# Patient Record
Sex: Female | Born: 1950
Health system: Southern US, Community
[De-identification: ages and names within clinical notes are randomized; demographics above are authoritative.]

## PROBLEM LIST (undated history)

## (undated) DIAGNOSIS — M1990 Unspecified osteoarthritis, unspecified site: Secondary | ICD-10-CM

## (undated) DIAGNOSIS — I10 Essential (primary) hypertension: Secondary | ICD-10-CM

## (undated) DIAGNOSIS — N200 Calculus of kidney: Secondary | ICD-10-CM

## (undated) DIAGNOSIS — B029 Zoster without complications: Secondary | ICD-10-CM

## (undated) DIAGNOSIS — H9190 Unspecified hearing loss, unspecified ear: Secondary | ICD-10-CM

## (undated) HISTORY — PX: TONSILLECTOMY: SUR1361

## (undated) HISTORY — DX: Unspecified hearing loss, unspecified ear: H91.90

## (undated) HISTORY — DX: Calculus of kidney: N20.0

## (undated) HISTORY — DX: Zoster without complications: B02.9

## (undated) HISTORY — DX: Essential (primary) hypertension: I10

---

## 1990-10-02 HISTORY — PX: VAGINAL HYSTERECTOMY: SUR661

## 1998-04-12 ENCOUNTER — Other Ambulatory Visit: Admission: RE | Admit: 1998-04-12 | Discharge: 1998-04-12 | Payer: Self-pay | Admitting: *Deleted

## 1999-04-06 ENCOUNTER — Ambulatory Visit (HOSPITAL_COMMUNITY): Admission: RE | Admit: 1999-04-06 | Discharge: 1999-04-06 | Payer: Self-pay | Admitting: *Deleted

## 1999-04-06 ENCOUNTER — Encounter: Payer: Self-pay | Admitting: *Deleted

## 2000-04-06 ENCOUNTER — Encounter: Payer: Self-pay | Admitting: *Deleted

## 2000-04-06 ENCOUNTER — Ambulatory Visit (HOSPITAL_COMMUNITY): Admission: RE | Admit: 2000-04-06 | Discharge: 2000-04-06 | Payer: Self-pay | Admitting: *Deleted

## 2000-04-23 ENCOUNTER — Other Ambulatory Visit: Admission: RE | Admit: 2000-04-23 | Discharge: 2000-04-23 | Payer: Self-pay | Admitting: *Deleted

## 2001-05-14 ENCOUNTER — Other Ambulatory Visit: Admission: RE | Admit: 2001-05-14 | Discharge: 2001-05-14 | Payer: Self-pay | Admitting: *Deleted

## 2002-04-01 ENCOUNTER — Encounter: Payer: Self-pay | Admitting: *Deleted

## 2002-04-01 ENCOUNTER — Ambulatory Visit (HOSPITAL_COMMUNITY): Admission: RE | Admit: 2002-04-01 | Discharge: 2002-04-01 | Payer: Self-pay | Admitting: *Deleted

## 2003-05-01 ENCOUNTER — Ambulatory Visit (HOSPITAL_COMMUNITY): Admission: RE | Admit: 2003-05-01 | Discharge: 2003-05-01 | Payer: Self-pay | Admitting: Obstetrics and Gynecology

## 2003-05-01 ENCOUNTER — Encounter: Payer: Self-pay | Admitting: Obstetrics and Gynecology

## 2004-05-05 ENCOUNTER — Ambulatory Visit (HOSPITAL_COMMUNITY): Admission: RE | Admit: 2004-05-05 | Discharge: 2004-05-05 | Payer: Self-pay | Admitting: Obstetrics and Gynecology

## 2005-06-01 ENCOUNTER — Emergency Department (HOSPITAL_COMMUNITY): Admission: EM | Admit: 2005-06-01 | Discharge: 2005-06-02 | Payer: Self-pay | Admitting: Emergency Medicine

## 2006-04-09 ENCOUNTER — Ambulatory Visit (HOSPITAL_COMMUNITY): Admission: RE | Admit: 2006-04-09 | Discharge: 2006-04-09 | Payer: Self-pay | Admitting: Family Medicine

## 2006-05-02 ENCOUNTER — Other Ambulatory Visit: Admission: RE | Admit: 2006-05-02 | Discharge: 2006-05-02 | Payer: Self-pay | Admitting: Gynecology

## 2007-05-06 ENCOUNTER — Other Ambulatory Visit: Admission: RE | Admit: 2007-05-06 | Discharge: 2007-05-06 | Payer: Self-pay | Admitting: Gynecology

## 2008-04-29 ENCOUNTER — Ambulatory Visit (HOSPITAL_COMMUNITY): Admission: RE | Admit: 2008-04-29 | Discharge: 2008-04-29 | Payer: Self-pay | Admitting: Gynecology

## 2008-05-11 ENCOUNTER — Other Ambulatory Visit: Admission: RE | Admit: 2008-05-11 | Discharge: 2008-05-11 | Payer: Self-pay | Admitting: Gynecology

## 2008-10-01 ENCOUNTER — Ambulatory Visit: Payer: Self-pay | Admitting: Gynecology

## 2009-02-28 ENCOUNTER — Emergency Department (HOSPITAL_BASED_OUTPATIENT_CLINIC_OR_DEPARTMENT_OTHER): Admission: EM | Admit: 2009-02-28 | Discharge: 2009-02-28 | Payer: Self-pay | Admitting: Emergency Medicine

## 2009-02-28 ENCOUNTER — Ambulatory Visit: Payer: Self-pay | Admitting: Diagnostic Radiology

## 2009-03-17 ENCOUNTER — Encounter: Admission: RE | Admit: 2009-03-17 | Discharge: 2009-03-17 | Payer: Self-pay | Admitting: Family Medicine

## 2009-05-20 ENCOUNTER — Ambulatory Visit: Payer: Self-pay | Admitting: Women's Health

## 2009-05-20 ENCOUNTER — Encounter: Payer: Self-pay | Admitting: Women's Health

## 2009-05-20 ENCOUNTER — Other Ambulatory Visit: Admission: RE | Admit: 2009-05-20 | Discharge: 2009-05-20 | Payer: Self-pay | Admitting: Gynecology

## 2009-12-07 ENCOUNTER — Ambulatory Visit (HOSPITAL_COMMUNITY): Admission: RE | Admit: 2009-12-07 | Discharge: 2009-12-07 | Payer: Self-pay | Admitting: Orthopedic Surgery

## 2010-03-22 ENCOUNTER — Ambulatory Visit (HOSPITAL_COMMUNITY): Admission: RE | Admit: 2010-03-22 | Discharge: 2010-03-22 | Payer: Self-pay | Admitting: Family Medicine

## 2010-05-25 ENCOUNTER — Other Ambulatory Visit: Admission: RE | Admit: 2010-05-25 | Discharge: 2010-05-25 | Payer: Self-pay | Admitting: Obstetrics and Gynecology

## 2010-05-25 ENCOUNTER — Ambulatory Visit: Payer: Self-pay | Admitting: Women's Health

## 2010-05-26 ENCOUNTER — Ambulatory Visit: Payer: Self-pay | Admitting: Women's Health

## 2010-06-03 ENCOUNTER — Ambulatory Visit: Payer: Self-pay | Admitting: Women's Health

## 2010-06-14 ENCOUNTER — Ambulatory Visit: Payer: Self-pay | Admitting: Gynecology

## 2010-09-30 ENCOUNTER — Ambulatory Visit: Admit: 2010-09-30 | Payer: Self-pay | Admitting: Gynecology

## 2010-10-24 ENCOUNTER — Encounter: Payer: Self-pay | Admitting: Family Medicine

## 2010-10-28 ENCOUNTER — Ambulatory Visit
Admission: RE | Admit: 2010-10-28 | Discharge: 2010-10-28 | Payer: Self-pay | Source: Home / Self Care | Attending: Gynecology | Admitting: Gynecology

## 2011-01-10 LAB — CBC
MCHC: 33.7 g/dL (ref 30.0–36.0)
MCV: 89.7 fL (ref 78.0–100.0)
Platelets: 269 10*3/uL (ref 150–400)
RBC: 4.9 MIL/uL (ref 3.87–5.11)
RDW: 12.9 % (ref 11.5–15.5)
WBC: 9.1 10*3/uL (ref 4.0–10.5)

## 2011-01-10 LAB — POCT CARDIAC MARKERS
Myoglobin, poc: 35.6 ng/mL (ref 12–200)
Myoglobin, poc: 57 ng/mL (ref 12–200)

## 2011-01-10 LAB — COMPREHENSIVE METABOLIC PANEL
Albumin: 4.3 g/dL (ref 3.5–5.2)
Alkaline Phosphatase: 75 U/L (ref 39–117)
BUN: 19 mg/dL (ref 6–23)
CO2: 27 mEq/L (ref 19–32)
Calcium: 9.5 mg/dL (ref 8.4–10.5)
Creatinine, Ser: 0.8 mg/dL (ref 0.4–1.2)
GFR calc Af Amer: >60 mL/min (ref >60)
Glucose, Bld: 92 mg/dL (ref 70–99)
Potassium: 3.9 mEq/L (ref 3.5–5.1)

## 2011-01-10 LAB — DIFFERENTIAL
Basophils Relative: 1 % (ref 0–1)
Eosinophils Absolute: 0 10*3/uL (ref 0.0–0.7)
Lymphs Abs: 1.7 10*3/uL (ref 0.7–4.0)

## 2011-01-10 LAB — LIPASE, BLOOD: Lipase: 204 U/L (ref 23–300)

## 2011-08-10 ENCOUNTER — Other Ambulatory Visit: Payer: Self-pay | Admitting: Dermatology

## 2011-09-07 ENCOUNTER — Encounter: Payer: Self-pay | Admitting: *Deleted

## 2011-09-07 DIAGNOSIS — H9190 Unspecified hearing loss, unspecified ear: Secondary | ICD-10-CM | POA: Insufficient documentation

## 2011-09-07 DIAGNOSIS — B029 Zoster without complications: Secondary | ICD-10-CM | POA: Insufficient documentation

## 2011-09-07 DIAGNOSIS — I10 Essential (primary) hypertension: Secondary | ICD-10-CM | POA: Insufficient documentation

## 2011-09-08 ENCOUNTER — Encounter: Payer: Self-pay | Admitting: Women's Health

## 2011-09-08 ENCOUNTER — Ambulatory Visit (INDEPENDENT_AMBULATORY_CARE_PROVIDER_SITE_OTHER): Payer: BC Managed Care – PPO | Admitting: Women's Health

## 2011-09-08 ENCOUNTER — Other Ambulatory Visit (HOSPITAL_COMMUNITY)
Admission: RE | Admit: 2011-09-08 | Discharge: 2011-09-08 | Disposition: A | Discharge status: Home / Self Care | Payer: BC Managed Care – PPO | Source: Ambulatory Visit | Attending: Women's Health | Admitting: Women's Health

## 2011-09-08 VITALS — BP 120/80 | Ht 61.0 in | Wt 120.5 lb

## 2011-09-08 DIAGNOSIS — Z13220 Encounter for screening for lipoid disorders: Secondary | ICD-10-CM

## 2011-09-08 DIAGNOSIS — Z01419 Encounter for gynecological examination (general) (routine) without abnormal findings: Secondary | ICD-10-CM

## 2011-09-08 DIAGNOSIS — M810 Age-related osteoporosis without current pathological fracture: Secondary | ICD-10-CM

## 2011-09-08 DIAGNOSIS — R82998 Other abnormal findings in urine: Secondary | ICD-10-CM

## 2011-09-08 DIAGNOSIS — Z833 Family history of diabetes mellitus: Secondary | ICD-10-CM

## 2011-09-08 MED ORDER — ALENDRONATE SODIUM 70 MG PO TABS: 70.0000 mg | ORAL_TABLET | ORAL | Status: AC

## 2011-09-08 NOTE — Progress Notes (Signed)
Hannah Fisher 1951/05/04 [id number]    History:    The patient presents for annual exam.  Retired Chartered loss adjuster. Has 2 daughters, 4 grandchildren one daughter having marital issues which has her worried.   Past medical history, past surgical history, family history and social history were all reviewed and documented in the EPIC chart.   ROS:  A  ROS was performed and pertinent positives and negatives are included in the history.  Exam:  Filed Vitals:   09/08/11 1419  BP: 120/80    General appearance:  Normal Head/Neck:  Normal, without cervical or supraclavicular adenopathy. Thyroid:  Symmetrical, normal in size, without palpable masses or nodularity. Respiratory  Effort:  Normal  Auscultation:  Clear without wheezing or rhonchi Cardiovascular  Auscultation:  Regular rate, without rubs, murmurs or gallops  Edema/varicosities:  Not grossly evident Abdominal  Soft,nontender, without masses, guarding or rebound.  Liver/spleen:  No organomegaly noted  Hernia:  None appreciated  Skin  Inspection:  Grossly normal  Palpation:  Grossly normal Neurologic/psychiatric  Orientation:  Normal with appropriate conversation.  Mood/affect:  Normal  Genitourinary    Breasts: Examined lying and sitting.     Right: Without masses, retractions, discharge or axillary adenopathy.     Left: Without masses, retractions, discharge or axillary adenopathy.   Inguinal/mons:  Normal without inguinal adenopathy  External genitalia:  Normal  BUS/Urethra/Skene's glands:  Normal  Bladder:  Normal  Vagina:  Normal  Cervix:  absent  Uterus:     Adnexa/parametria:     Rt: Without masses or tenderness.   Lt: Without masses or tenderness.  Anus and perineum: Normal  Digital rectal exam: Normal sphincter tone without palpated masses or tenderness  Assessment/Plan:  60 y.o. DWF G2P2 for annual exam. Hysterectomy 89 for menorrhagia on no ERT. History of normal Paps and mammograms. Colonoscopy in  2011 with one benign polyp. Has had zostovac.  Normal postmenopausal exam Hypertension/primary care Osteoporosis/T score -2.8/ 2011  Plan: Fosamax 70 weekly, prescription, proper use, given and reviewed. Fall prevention and home safety reviewed. Repeat DEXA  August, 2013. SBEs, annual mammogram, overdue instructed to schedule. Encouraged exercise, calcium rich diet, vitamin D 2000 daily. CBC, glucose, lipid profile, UA, Pap (requested labs to be done here).    Harrington Challenger [facility], 5:12 PM 09/08/2011

## 2011-09-08 NOTE — Patient Instructions (Signed)
tdap vaccine   

## 2011-09-12 ENCOUNTER — Other Ambulatory Visit: Payer: Self-pay

## 2011-09-12 NOTE — Telephone Encounter (Signed)
GETS PRN XANAX AND IS REQUESTING REFILLS SINCE YOU JUST SAW HER FOR 09-08-11 AEX.

## 2011-09-13 NOTE — Telephone Encounter (Signed)
rx called in and pt informed. kw

## 2011-09-13 NOTE — Telephone Encounter (Signed)
Please call in Xanax 0.25 every 8 hours when necessary #[phone] with 1 refill and inform patient- thanks

## 2012-05-24 ENCOUNTER — Other Ambulatory Visit: Payer: Self-pay | Admitting: Women's Health

## 2012-05-27 MED ORDER — ALPRAZOLAM 0.25 MG PO TABS: 0.2500 mg | ORAL_TABLET | Freq: Three times a day (TID) | ORAL | Status: DC

## 2012-05-27 NOTE — Telephone Encounter (Signed)
Called into pharmacy

## 2012-07-03 ENCOUNTER — Other Ambulatory Visit (HOSPITAL_COMMUNITY): Payer: Self-pay | Admitting: Obstetrics and Gynecology

## 2012-07-03 DIAGNOSIS — Z1231 Encounter for screening mammogram for malignant neoplasm of breast: Secondary | ICD-10-CM

## 2012-07-22 ENCOUNTER — Ambulatory Visit (HOSPITAL_COMMUNITY): Payer: BC Managed Care – PPO

## 2012-08-08 ENCOUNTER — Ambulatory Visit (HOSPITAL_COMMUNITY): Payer: BC Managed Care – PPO

## 2012-09-09 ENCOUNTER — Encounter: Payer: Self-pay | Admitting: Women's Health

## 2012-09-09 ENCOUNTER — Ambulatory Visit (INDEPENDENT_AMBULATORY_CARE_PROVIDER_SITE_OTHER): Payer: BC Managed Care – PPO | Admitting: Women's Health

## 2012-09-09 VITALS — BP 136/86 | Ht 63.5 in | Wt 123.0 lb

## 2012-09-09 DIAGNOSIS — F419 Anxiety disorder, unspecified: Secondary | ICD-10-CM

## 2012-09-09 DIAGNOSIS — F411 Generalized anxiety disorder: Secondary | ICD-10-CM

## 2012-09-09 DIAGNOSIS — M810 Age-related osteoporosis without current pathological fracture: Secondary | ICD-10-CM

## 2012-09-09 DIAGNOSIS — Z01419 Encounter for gynecological examination (general) (routine) without abnormal findings: Secondary | ICD-10-CM

## 2012-09-09 LAB — CBC WITH DIFFERENTIAL/PLATELET
Hemoglobin: 12.9 g/dL (ref 12.0–15.0)
Lymphocytes Relative: 36 % (ref 12–46)
Lymphs Abs: 2.1 10*3/uL (ref 0.7–4.0)
MCH: 29.5 pg (ref 26.0–34.0)
Monocytes Relative: 5 % (ref 3–12)
Neutro Abs: 3.2 10*3/uL (ref 1.7–7.7)
Neutrophils Relative %: 55 % (ref 43–77)
RBC: 4.38 MIL/uL (ref 3.87–5.11)
WBC: 5.9 10*3/uL (ref 4.0–10.5)

## 2012-09-09 MED ORDER — ALPRAZOLAM 0.25 MG PO TABS: 0.2500 mg | ORAL_TABLET | Freq: Three times a day (TID) | ORAL | Status: DC

## 2012-09-09 MED ORDER — ALENDRONATE SODIUM 70 MG PO TABS: 70.0000 mg | ORAL_TABLET | ORAL | Status: DC

## 2012-09-09 NOTE — Patient Instructions (Addendum)
Health Recommendations for Postmenopausal Women  SCHEDULE MAMMOGRAM!!!!!  Based on the Results of the Women's Health Initiative [facility]) and Other Studies The WHI is a major 15-year research program to address the most common causes of death, disability and poor quality of life in postmenopausal women. Some of these causes are heart disease, cancer, bone loss (osteoporosis) and others. Taking into account all of the findings from [facility] and other studies, here are bottom-line health recommendations for women: CARDIOVASCULAR DISEASE Heart Disease: A heart attack is a medical emergency. Know the signs and symptoms of a heart attack. Hormone therapy should not be used to prevent heart disease. In women with heart disease, hormone therapy should not be used to prevent further disease. Hormone therapy increases the risk of blood clots. Below are things women can do to reduce their risk for heart disease.   Do not smoke. If you smoke, quit. Women who smoke are 2 to 6 times more likely to suffer a heart attack than non-smoking women.  Aim for a healthy weight. Being overweight causes many preventable deaths. Eat a healthy and balanced diet and drink an adequate amount of liquids.  Get moving. Make a commitment to be more physically active. Aim for 30 minutes of activity on most, if not all days of the week.  Eat for heart health. Choose a diet that is low in saturated fat, trans fat, and cholesterol. Include whole grains, vegetables, and fruits. Read the labels on the food container before buying it.  Know your numbers. Ask your caregiver to check your blood pressure, cholesterol (total, HDL, LDL, triglycerides) and blood glucose. Work with your caregiver to improve any numbers that are not normal.  High blood pressure. Limit or stop your table salt intake (try salt substitute and food seasonings), avoid salty foods and drinks. Read the labels on the food container before buying it. Avoid becoming overweight  by eating well and exercising. STROKE  Stroke is a medical emergency. Stroke can be the result of a blood clot in the blood vessel in the brain or by a brain hemorrhage (bleeding). Know the signs and symptoms of a stroke. To lower the risk of developing a stroke:  Avoid fatty foods.  Quit smoking.  Control your diabetes, blood pressure, and irregular heart rate. THROMBOPHLIBITIS (BLOOD CLOT) OF THE LEG  Hormone treatment is a big cause of developing blood clots in the leg. Becoming overweight and leading a stationary lifestyle also may contribute to developing blood clots. Controlling your diet and exercising will help lower the risk of developing blood clots. CANCER SCREENING  Breast Cancer: Women should take steps to reduce their risk of breast cancer. This includes having regular mammograms, monthly self breast exams and regular breast exams by your caregiver. Have a mammogram every one to two years if you are 57 to 62 years old. Have a mammogram annually if you are 59 years old or older depending on your risk factors. Women who are high risk for breast cancer may need more frequent mammograms. There are tests available (testing the genes in your body) if you have family history of breast cancer called BRCA 1 and 2. These tests can help determine the risks of developing breast cancer.  Intestinal or Stomach Cancer: Women should talk to their caregiver about when to start screening, what tests and how often they should be done, and the benefits and risks of doing these tests. Tests to consider are a rectal exam, fecal occult blood, sigmoidoscopy, colononoscoby, barium enema  and upper GI series of the stomach. Depending on the age, you may want to get a medical and family history of colon cancer. Women who are high risk may need to be screened at an earlier age and more often.  Cervical Cancer: A Pap test of the cervix should be done every year and every 3 years when there has been three straight  years of a normal Pap test. Women with an abnormal Pap test should be screened more often or have a cervical biopsy depending on your caregiver's recommendation.  Uterine Cancer: If you have vaginal bleeding after you are in the menopause, it should be evaluated by your caregiver.  Ovarian cancer: There are no reliable tests available to screen for ovarian cancer at this time except for yearly pelvic exams.  Lung Cancer: Yearly chest X-rays can detect lung cancer and should be done on high risk women, such as cigarette smokers and women with chronic lung disease (emphysemia).  Skin Cancer: A complete body skin exam should be done at your yearly examination. Avoid overexposure to the sun and ultraviolet light lamps. Use a strong sun block cream when in the sun. All of these things are important in lowering the risk of skin cancer. MENOPAUSE Menopause Symptoms: Hormone therapy products are effective for treating symptoms associated with menopause:  Moderate to severe hot flashes.  Night sweats.  Mood swings.  Headaches.  Tiredness.  Loss of sex drive.  Insomnia.  Other symptoms. However, hormone therapy products carry serious risks, especially in older women. Women who use or are thinking about using estrogen or estrogen with progestin treatments should discuss that with their caregiver. Your caregiver will know if the benefits outweigh the risks. The Food and Drug Administration (FDA) has concluded that hormone therapy should be used only at the lowest doses and for the shortest amount of time to reach treatment goals. It is not known at what doses there may be less risk of serious side effects. There are other treatments such as herbal medication (not controlled or regulated by the FDA), group therapy, counseling and acupuncture that may be helpful. OSTEOPOROSIS Protecting Against Bone Loss and Preventing Fracture: If hormone therapy is used for prevention of bone loss (osteoporosis),  the risks for bone loss must outweigh the risk of the therapy. Women considering taking hormone therapy for bone loss should ask their health care providers about other medications (fosamax and boniva) that are considered safe and effective for preventing bone loss and bone fractures. To guard against bone loss or fractures, it is recommended that women should take at least 1000-1500 mg of calcium and 400-800 IU of vitamin D daily in divided doses. Smoking and excessive alcohol intake increases the risk of osteoporosis. Eat foods rich in calcium and vitamin D and do weight bearing exercises several times a week as your caregiver suggests. DIABETES Diabetes Melitus: Women with Type I or Type 2 diabetes should keep their diabetes in control with diet, exercise and medication. Avoid too many sweets, starchy and fatty foods. Being overweight can affect your diabetes. COGNITION AND MEMORY Cognition and Memory: Menopausal hormone therapy is not recommended for the prevention of cognitive disorders such as Alzheimer's disease or memory loss. WHI found that women treated with hormone therapy have a greater risk of developing dementia.  DEPRESSION  Depression may occur at any age, but is common in elderly women. The reasons may be because of physical, medical, social (loneliness), financial and/or economic problems and needs. Becoming involved with church,  volunteer or social groups, seeking treatment for any physical or medical problems is recommended. Also, look into getting professional advice for any economic or financial problems. ACCIDENTS  Accidents are common and can be serious in the elderly woman. Prepare your house to prevent accidents. Eliminate throw rugs, use hip protectors, place hand bars in the bath, shower and toilet areas. Avoid wearing high heel shoes and walking on wet, snowy and icy areas. Stop driving if you have vision, hearing problems or are unsteady with you movements and  reflexes. RHEUMATOID ARTHRITIS Rheumatoid arthritis causes pain, swelling and stiffness of your bone joints. It can limit many of your activities. Over-the-counter medications may help, but prescription medications may be necessary. Talk with your caregiver about this. Exercise (walking, water aerobics), good posture, using splints on painful joints, warm baths or applying warm compresses to stiff joints and cold compresses to painful joints may be helpful. Smoking and excessive drinking may worsen the symptoms of arthritis. Seek help from a physical therapist if the arthritis is becoming a problem with your daily activities. IMMUNIZATIONS  Several immunizations are important to have during your senior years, including:   Tetanus and a diptheria shot booster every 10 years.  Influenza every year before the flu season begins.  Pneumonia vaccine.  Shingles vaccine.  Others as indicated (example: H1N1 vaccine). Document Released: 11/10/2005 Document Revised: 12/11/2011 Document Reviewed: 07/06/2008 [facility] Patient Information 2013 [place], [place].

## 2012-09-09 NOTE — Progress Notes (Signed)
Hannah Fisher 05-27-1951 [id number]    History:    The patient presents for annual exam.  TVH in 1992/ menorrhagia. Osteoporosis  T score  -2.8 at spine, bilateral hip average -2.3., Significant decrease in BMD in the spine and hip,  Fosamax 70 weekly since 8/ 2011. Was on Fosamax in 2007  but had quit. History of normal calcium, PTH, vitamin D 2011. History of normal mammograms, last one 2011. Hypertensive - primary care manages. History of shingles causing left ear deafness. Takes an occasional Xanax. Colonoscopy 03/2010-negative polyp.   Past medical history, past surgical history, family history and social history were all reviewed and documented in the EPIC chart. Retired Chartered loss adjuster, working part time. Same partner. 2 daughters, one lives in [place] having marital problems, 1 lives here.   ROS:  A  ROS was performed and pertinent positives and negatives are included in the history.  Exam:  Filed Vitals:   09/09/12 1549  BP: 136/86    General appearance:  Normal Head/Neck:  Normal, without cervical or supraclavicular adenopathy. Thyroid:  Symmetrical, normal in size, without palpable masses or nodularity. Respiratory  Effort:  Normal  Auscultation:  Clear without wheezing or rhonchi Cardiovascular  Auscultation:  Regular rate, without rubs, murmurs or gallops  Edema/varicosities:  Not grossly evident Abdominal  Soft,nontender, without masses, guarding or rebound.  Liver/spleen:  No organomegaly noted  Hernia:  None appreciated  Skin  Inspection:  Grossly normal  Palpation:  Grossly normal Neurologic/psychiatric  Orientation:  Normal with appropriate conversation.  Mood/affect:  Normal  Genitourinary    Breasts: Examined lying and sitting.     Right: Without masses, retractions, discharge or axillary adenopathy.     Left: Without masses, retractions, discharge or axillary adenopathy.   Inguinal/mons:  Normal without inguinal adenopathy  External genitalia:   Normal  BUS/Urethra/Skene's glands:  Normal  Bladder:  Normal  Vagina:  Normal  Cervix:  Absent  Uterus:  Absent  Adnexa/parametria:     Rt: Without masses or tenderness.   Lt: Without masses or tenderness.  Anus and perineum: Normal  Digital rectal exam: Normal sphincter tone without palpated masses or tenderness  Assessment/Plan:  61 y.o. D. WF G2 P2 for annual exam without complaint.  TVH/menorrhagia/no ERT Osteoporosis T score -2.8 at spine Fosamax 70 weekly Hypertension primary care meds Left ear partial deafness shingles  Plan: Repeat DEXA. Will schedule, reviewed importance of exercise, home safety and fall prevention, continue vitamin D 2000 daily. SBE's, instructed to schedule annual mammogram. CBC, glucose, TSH, UA, Pap normal 2012 reviewed new screening guidelines. Normal lipid panel last year will come fasting next year for FLP. Fosamax 70 mg weekly prescription, proper administration reviewed.  Harrington Challenger [facility], 5:28 PM 09/09/2012

## 2012-09-10 LAB — GLUCOSE, RANDOM: Glucose, Bld: 99 mg/dL (ref 70–99)

## 2012-09-10 LAB — URINALYSIS W MICROSCOPIC + REFLEX CULTURE
Casts: NONE SEEN
Crystals: NONE SEEN
Nitrite: NEGATIVE
Specific Gravity, Urine: 1.011 (ref 1.005–1.030)
Urobilinogen, UA: 0.2 mg/dL (ref 0.0–1.0)
pH: 7 (ref 5.0–8.0)

## 2012-09-11 LAB — URINE CULTURE: Organism ID, Bacteria: NO GROWTH

## 2012-09-24 ENCOUNTER — Encounter: Payer: Self-pay | Admitting: Gynecology

## 2012-11-28 ENCOUNTER — Other Ambulatory Visit: Payer: Self-pay

## 2012-11-28 DIAGNOSIS — F419 Anxiety disorder, unspecified: Secondary | ICD-10-CM

## 2012-11-28 MED ORDER — ALPRAZOLAM 0.25 MG PO TABS: 0.2500 mg | ORAL_TABLET | Freq: Three times a day (TID) | ORAL | Status: DC

## 2012-11-28 NOTE — Telephone Encounter (Signed)
rx called into pharmacy

## 2013-04-17 ENCOUNTER — Other Ambulatory Visit: Payer: Self-pay

## 2013-04-17 DIAGNOSIS — F419 Anxiety disorder, unspecified: Secondary | ICD-10-CM

## 2013-04-17 MED ORDER — ALPRAZOLAM 0.25 MG PO TABS: 0.2500 mg | ORAL_TABLET | Freq: Three times a day (TID) | ORAL | Status: DC

## 2013-04-17 NOTE — Telephone Encounter (Signed)
rx called into pharmacy

## 2014-03-03 ENCOUNTER — Ambulatory Visit (INDEPENDENT_AMBULATORY_CARE_PROVIDER_SITE_OTHER): Payer: BC Managed Care – PPO | Admitting: Women's Health

## 2014-03-03 ENCOUNTER — Encounter: Payer: Self-pay | Admitting: Women's Health

## 2014-03-03 ENCOUNTER — Other Ambulatory Visit (HOSPITAL_COMMUNITY): Payer: Self-pay | Admitting: Family Medicine

## 2014-03-03 VITALS — BP 120/80 | Ht 61.5 in | Wt 116.0 lb

## 2014-03-03 DIAGNOSIS — Z13220 Encounter for screening for lipoid disorders: Secondary | ICD-10-CM

## 2014-03-03 DIAGNOSIS — Z01419 Encounter for gynecological examination (general) (routine) without abnormal findings: Secondary | ICD-10-CM

## 2014-03-03 DIAGNOSIS — F411 Generalized anxiety disorder: Secondary | ICD-10-CM

## 2014-03-03 DIAGNOSIS — Z1231 Encounter for screening mammogram for malignant neoplasm of breast: Secondary | ICD-10-CM

## 2014-03-03 DIAGNOSIS — M810 Age-related osteoporosis without current pathological fracture: Secondary | ICD-10-CM

## 2014-03-03 DIAGNOSIS — F419 Anxiety disorder, unspecified: Secondary | ICD-10-CM

## 2014-03-03 LAB — COMPREHENSIVE METABOLIC PANEL
ALT: 13 U/L (ref 0–35)
AST: 16 U/L (ref 0–37)
Albumin: 4.5 g/dL (ref 3.5–5.2)
Alkaline Phosphatase: 60 U/L (ref 39–117)
BUN: 21 mg/dL (ref 6–23)
CHLORIDE: 100 meq/L (ref 96–112)
CO2: 28 mEq/L (ref 19–32)
Calcium: 9.8 mg/dL (ref 8.4–10.5)
Creat: 0.79 mg/dL (ref 0.50–1.10)
GLUCOSE: 85 mg/dL (ref 70–99)
Potassium: 3.8 mEq/L (ref 3.5–5.3)
SODIUM: 139 meq/L (ref 135–145)
Total Bilirubin: 0.3 mg/dL (ref 0.2–1.2)
Total Protein: 6.8 g/dL (ref 6.0–8.3)

## 2014-03-03 LAB — CBC WITH DIFFERENTIAL/PLATELET
Basophils Absolute: 0 10*3/uL (ref 0.0–0.1)
Basophils Relative: 0 % (ref 0–1)
Eosinophils Absolute: 0.2 10*3/uL (ref 0.0–0.7)
Eosinophils Relative: 2 % (ref 0–5)
HCT: 38 % (ref 36.0–46.0)
Hemoglobin: 13.1 g/dL (ref 12.0–15.0)
Lymphocytes Relative: 29 % (ref 12–46)
Lymphs Abs: 2.7 10*3/uL (ref 0.7–4.0)
MCH: 29.8 pg (ref 26.0–34.0)
MCHC: 34.5 g/dL (ref 30.0–36.0)
MCV: 86.6 fL (ref 78.0–100.0)
Monocytes Absolute: 0.5 10*3/uL (ref 0.1–1.0)
Monocytes Relative: 5 % (ref 3–12)
Neutro Abs: 6 10*3/uL (ref 1.7–7.7)
Neutrophils Relative %: 64 % (ref 43–77)
Platelets: 241 10*3/uL (ref 150–400)
RBC: 4.39 MIL/uL (ref 3.87–5.11)
RDW: 13.7 % (ref 11.5–15.5)
WBC: 9.4 10*3/uL (ref 4.0–10.5)

## 2014-03-03 LAB — LIPID PANEL
Cholesterol: 180 mg/dL (ref 0–200)
HDL: 59 mg/dL (ref >39)
LDL Cholesterol: 87 mg/dL (ref 0–99)
TRIGLYCERIDES: 172 mg/dL — AB (ref <150)
Total CHOL/HDL Ratio: 3.1 Ratio
VLDL: 34 mg/dL (ref 0–40)

## 2014-03-03 MED ORDER — ALPRAZOLAM 0.25 MG PO TABS: 0.2500 mg | ORAL_TABLET | Freq: Three times a day (TID) | ORAL | Status: DC

## 2014-03-03 NOTE — Progress Notes (Signed)
Hannah Fisher Nov 10, 1950 [id number]    History:    Presents for annual exam.  Postmenopausal on no HRT. 1992 TVH for menorrhagia. Same partner. Normal Pap and mammogram history. Osteoporosis last DEXA 05/2010 T score of -2.8 at spine hip average -2.3. Had been on Fosomax  in 2011 and stopped. 2011, normal PTH, calcium, vitamin D. Colonoscopy negative polyp. Hypertension primary care manages.  Past medical history, past surgical history, family history and social history were all reviewed and documented in the EPIC chart. Retired Engineer, site, Lawyer now. Deaf in left ear from shingles. 2 daughters and 5 grandchildren.  ROS:  A  12 point ROS was performed and pertinent positives and negatives are included.  Exam:  Filed Vitals:   03/03/14 1605  BP: 120/80    General appearance:  Normal Thyroid:  Symmetrical, normal in size, without palpable masses or nodularity. Respiratory  Auscultation:  Clear without wheezing or rhonchi Cardiovascular  Auscultation:  Regular rate, without rubs, murmurs or gallops  Edema/varicosities:  Not grossly evident Abdominal  Soft,nontender, without masses, guarding or rebound.  Liver/spleen:  No organomegaly noted  Hernia:  None appreciated  Skin  Inspection:  Grossly normal   Breasts: Examined lying and sitting.     Right: Without masses, retractions, discharge or axillary adenopathy.     Left: Without masses, retractions, discharge or axillary adenopathy. Gentitourinary   Inguinal/mons:  Normal without inguinal adenopathy  External genitalia:  Normal  BUS/Urethra/Skene's glands:  Normal  Vagina:  Normal  Cervix:  Absent  Uterus:  Absent Adnexa/parametria:     Rt: Without masses or tenderness.   Lt: Without masses or tenderness.  Anus and perineum: Normal  Digital rectal exam: Normal sphincter tone without palpated masses or tenderness  Assessment/Plan:  63 y.o.DWF G2P2  for annual exam.     Postmenopausal/TVH/no HRT   Hypertension-primary care manages Osteoporosis on no medication  Plan: Repeat DEXA, medications reviewed, SBE's, annual mammogram, calcium rich diet, vitamin D 2000 daily. Home Safety and fall prevention discussed. CBC, comprehensive metabolic panel, lipid panel, UA, will take lab results to primary care next month.     Note: This dictation was prepared with Dragon/digital dictation.  Any transcriptional errors that result are unintentional. Harrington Challenger [facility], 5:17 PM 03/03/2014

## 2014-03-03 NOTE — Patient Instructions (Signed)
Health Recommendations for Postmenopausal Women Respected and ongoing research has looked at the most common causes of death, disability, and poor quality of life in postmenopausal women. The causes include heart disease, diseases of blood vessels, diabetes, depression, cancer, and bone loss (osteoporosis). Many things can be done to help lower the chances of developing these and other common problems: CARDIOVASCULAR DISEASE Heart Disease: A heart attack is a medical emergency. Know the signs and symptoms of a heart attack. Below are things women can do to reduce their risk for heart disease.   Do not smoke. If you smoke, quit.  Aim for a healthy weight. Being overweight causes many preventable deaths. Eat a healthy and balanced diet and drink an adequate amount of liquids.  Get moving. Make a commitment to be more physically active. Aim for 30 minutes of activity on most, if not all days of the week.  Eat for heart health. Choose a diet that is low in saturated fat and cholesterol and eliminate trans fat. Include whole grains, vegetables, and fruits. Read and understand the labels on food containers before buying.  Know your numbers. Ask your caregiver to check your blood pressure, cholesterol (total, HDL, LDL, triglycerides) and blood glucose. Work with your caregiver on improving your entire clinical picture.  High blood pressure. Limit or stop your table salt intake (try salt substitute and food seasonings). Avoid salty foods and drinks. Read labels on food containers before buying. Eating well and exercising can help control high blood pressure. STROKE  Stroke is a medical emergency. Stroke may be the result of a blood clot in a blood vessel in the brain or by a brain hemorrhage (bleeding). Know the signs and symptoms of a stroke. To lower the risk of developing a stroke:  Avoid fatty foods.  Quit smoking.  Control your diabetes, blood pressure, and irregular heart rate. THROMBOPHLEBITIS  (BLOOD CLOT) OF THE LEG  Becoming overweight and leading a stationary lifestyle may also contribute to developing blood clots. Controlling your diet and exercising will help lower the risk of developing blood clots. CANCER SCREENING  Breast Cancer: Take steps to reduce your risk of breast cancer.  You should practice "breast self-awareness." This means understanding the normal appearance and feel of your breasts and should include breast self-examination. Any changes detected, no matter how small, should be reported to your caregiver.  After age 40, you should have a clinical breast exam (CBE) every year.  Starting at age 40, you should consider having a mammogram (breast X-ray) every year.  If you have a family history of breast cancer, talk to your caregiver about genetic screening.  If you are at high risk for breast cancer, talk to your caregiver about having an MRI and a mammogram every year.  Intestinal or Stomach Cancer: Tests to consider are a rectal exam, fecal occult blood, sigmoidoscopy, and colonoscopy. Women who are high risk may need to be screened at an earlier age and more often.  Cervical Cancer:  Beginning at age 30, you should have a Pap test every 3 years as long as the past 3 Pap tests have been normal.  If you have had past treatment for cervical cancer or a condition that could lead to cancer, you need Pap tests and screening for cancer for at least 20 years after your treatment.  If you had a hysterectomy for a problem that was not cancer or a condition that could lead to cancer, then you no longer need Pap tests.    If you are between ages 65 and 70, and you have had normal Pap tests going back 10 years, you no longer need Pap tests.  If Pap tests have been discontinued, risk factors (such as a new sexual partner) need to be reassessed to determine if screening should be resumed.  Some medical problems can increase the chance of getting cervical cancer. In these  cases, your caregiver may recommend more frequent screening and Pap tests.  Uterine Cancer: If you have vaginal bleeding after reaching menopause, you should notify your caregiver.  Ovarian cancer: Other than yearly pelvic exams, there are no reliable tests available to screen for ovarian cancer at this time except for yearly pelvic exams.  Lung Cancer: Yearly chest X-rays can detect lung cancer and should be done on high risk women, such as cigarette smokers and women with chronic lung disease (emphysema).  Skin Cancer: A complete body skin exam should be done at your yearly examination. Avoid overexposure to the sun and ultraviolet light lamps. Use a strong sun block cream when in the sun. All of these things are important in lowering the risk of skin cancer. MENOPAUSE Menopause Symptoms: Hormone therapy products are effective for treating symptoms associated with menopause:  Moderate to severe hot flashes.  Night sweats.  Mood swings.  Headaches.  Tiredness.  Loss of sex drive.  Insomnia.  Other symptoms. Hormone replacement carries certain risks, especially in older women. Women who use or are thinking about using estrogen or estrogen with progestin treatments should discuss that with their caregiver. Your caregiver will help you understand the benefits and risks. The ideal dose of hormone replacement therapy is not known. The Food and Drug Administration (FDA) has concluded that hormone therapy should be used only at the lowest doses and for the shortest amount of time to reach treatment goals.  OSTEOPOROSIS Protecting Against Bone Loss and Preventing Fracture: If you use hormone therapy for prevention of bone loss (osteoporosis), the risks for bone loss must outweigh the risk of the therapy. Ask your caregiver about other medications known to be safe and effective for preventing bone loss and fractures. To guard against bone loss or fractures, the following is recommended:  If  you are less than age 50, take 1000 mg of calcium and at least 600 mg of Vitamin D per day.  If you are greater than age 50 but less than age 70, take 1200 mg of calcium and at least 600 mg of Vitamin D per day.  If you are greater than age 70, take 1200 mg of calcium and at least 800 mg of Vitamin D per day. Smoking and excessive alcohol intake increases the risk of osteoporosis. Eat foods rich in calcium and vitamin D and do weight bearing exercises several times a week as your caregiver suggests. DIABETES Diabetes Melitus: If you have Type I or Type 2 diabetes, you should keep your blood sugar under control with diet, exercise and recommended medication. Avoid too many sweets, starchy and fatty foods. Being overweight can make control more difficult. COGNITION AND MEMORY Cognition and Memory: Menopausal hormone therapy is not recommended for the prevention of cognitive disorders such as Alzheimer's disease or memory loss.  DEPRESSION  Depression may occur at any age, but is common in elderly women. The reasons may be because of physical, medical, social (loneliness), or financial problems and needs. If you are experiencing depression because of medical problems and control of symptoms, talk to your caregiver about this. Physical activity and   exercise may help with mood and sleep. Community and volunteer involvement may help your sense of value and worth. If you have depression and you feel that the problem is getting worse or becoming severe, talk to your caregiver about treatment options that are best for you. ACCIDENTS  Accidents are common and can be serious in the elderly woman. Prepare your house to prevent accidents. Eliminate throw rugs, place hand bars in the bath, shower and toilet areas. Avoid wearing high heeled shoes or walking on wet, snowy, and icy areas. Limit or stop driving if you have vision or hearing problems, or you feel you are unsteady with you movements and  reflexes. HEPATITIS C Hepatitis C is a type of viral infection affecting the liver. It is spread mainly through contact with blood from an infected person. It can be treated, but if left untreated, it can lead to severe liver damage over years. Many people who are infected do not know that the virus is in their blood. If you are a "baby-boomer", it is recommended that you have one screening test for Hepatitis C. IMMUNIZATIONS  Several immunizations are important to consider having during your senior years, including:   Tetanus, diptheria, and pertussis booster shot.  Influenza every year before the flu season begins.  Pneumonia vaccine.  Shingles vaccine.  Others as indicated based on your specific needs. Talk to your caregiver about these. Document Released: 11/10/2005 Document Revised: 09/04/2012 Document Reviewed: 07/06/2008 [employer] Patient Information 2014 [employer].  

## 2014-03-04 LAB — URINALYSIS W MICROSCOPIC + REFLEX CULTURE
BACTERIA UA: NONE SEEN
Bilirubin Urine: NEGATIVE
CASTS: NONE SEEN
CRYSTALS: NONE SEEN
Glucose, UA: NEGATIVE mg/dL
KETONES UR: NEGATIVE mg/dL
NITRITE: NEGATIVE
PH: 6.5 (ref 5.0–8.0)
Protein, ur: NEGATIVE mg/dL
Specific Gravity, Urine: 1.01 (ref 1.005–1.030)
Urobilinogen, UA: 0.2 mg/dL (ref 0.0–1.0)

## 2014-03-05 ENCOUNTER — Ambulatory Visit (HOSPITAL_COMMUNITY): Payer: BC Managed Care – PPO

## 2014-03-05 LAB — URINE CULTURE: Colony Count: 3000

## 2014-03-16 ENCOUNTER — Other Ambulatory Visit: Payer: Self-pay | Admitting: Gynecology

## 2014-03-16 DIAGNOSIS — M810 Age-related osteoporosis without current pathological fracture: Secondary | ICD-10-CM

## 2014-06-04 ENCOUNTER — Ambulatory Visit (HOSPITAL_COMMUNITY)
Admission: RE | Admit: 2014-06-04 | Discharge: 2014-06-04 | Disposition: A | Discharge status: Home / Self Care | Payer: BC Managed Care – PPO | Source: Ambulatory Visit | Attending: Family Medicine | Admitting: Family Medicine

## 2014-06-04 DIAGNOSIS — Z1231 Encounter for screening mammogram for malignant neoplasm of breast: Secondary | ICD-10-CM | POA: Diagnosis present

## 2014-08-03 ENCOUNTER — Encounter: Payer: Self-pay | Admitting: Women's Health

## 2014-09-09 ENCOUNTER — Other Ambulatory Visit: Payer: Self-pay

## 2014-09-09 DIAGNOSIS — F419 Anxiety disorder, unspecified: Secondary | ICD-10-CM

## 2014-09-09 MED ORDER — ALPRAZOLAM 0.25 MG PO TABS: 0.2500 mg | ORAL_TABLET | Freq: Three times a day (TID) | ORAL | Status: DC

## 2014-09-09 NOTE — Telephone Encounter (Signed)
Called into pharmacy

## 2014-12-28 ENCOUNTER — Other Ambulatory Visit: Payer: Self-pay

## 2014-12-28 DIAGNOSIS — F419 Anxiety disorder, unspecified: Secondary | ICD-10-CM

## 2014-12-28 MED ORDER — ALPRAZOLAM 0.25 MG PO TABS: 0.2500 mg | ORAL_TABLET | Freq: Three times a day (TID) | ORAL | Status: DC

## 2014-12-28 NOTE — Telephone Encounter (Signed)
Rx phoned in.   

## 2016-02-07 DIAGNOSIS — J029 Acute pharyngitis, unspecified: Secondary | ICD-10-CM | POA: Diagnosis not present

## 2016-02-07 DIAGNOSIS — J309 Allergic rhinitis, unspecified: Secondary | ICD-10-CM | POA: Diagnosis not present

## 2016-02-07 DIAGNOSIS — J320 Chronic maxillary sinusitis: Secondary | ICD-10-CM | POA: Diagnosis not present

## 2016-02-23 DIAGNOSIS — H01001 Unspecified blepharitis right upper eyelid: Secondary | ICD-10-CM | POA: Diagnosis not present

## 2016-02-23 DIAGNOSIS — H2513 Age-related nuclear cataract, bilateral: Secondary | ICD-10-CM | POA: Diagnosis not present

## 2016-02-23 DIAGNOSIS — H01004 Unspecified blepharitis left upper eyelid: Secondary | ICD-10-CM | POA: Diagnosis not present

## 2016-02-23 DIAGNOSIS — H01002 Unspecified blepharitis right lower eyelid: Secondary | ICD-10-CM | POA: Diagnosis not present

## 2016-02-23 DIAGNOSIS — H04123 Dry eye syndrome of bilateral lacrimal glands: Secondary | ICD-10-CM | POA: Diagnosis not present

## 2016-02-23 DIAGNOSIS — H524 Presbyopia: Secondary | ICD-10-CM | POA: Diagnosis not present

## 2016-02-23 DIAGNOSIS — H01005 Unspecified blepharitis left lower eyelid: Secondary | ICD-10-CM | POA: Diagnosis not present

## 2016-04-14 DIAGNOSIS — M545 Low back pain: Secondary | ICD-10-CM | POA: Diagnosis not present

## 2016-04-14 DIAGNOSIS — I10 Essential (primary) hypertension: Secondary | ICD-10-CM | POA: Diagnosis not present

## 2016-04-14 DIAGNOSIS — K5909 Other constipation: Secondary | ICD-10-CM | POA: Diagnosis not present

## 2016-04-14 DIAGNOSIS — Z0000 Encounter for general adult medical examination without abnormal findings: Secondary | ICD-10-CM | POA: Diagnosis not present

## 2016-04-14 DIAGNOSIS — Z1159 Encounter for screening for other viral diseases: Secondary | ICD-10-CM | POA: Diagnosis not present

## 2016-05-18 DIAGNOSIS — H903 Sensorineural hearing loss, bilateral: Secondary | ICD-10-CM | POA: Diagnosis not present

## 2016-05-18 DIAGNOSIS — H6983 Other specified disorders of Eustachian tube, bilateral: Secondary | ICD-10-CM | POA: Diagnosis not present

## 2016-05-29 DIAGNOSIS — S40011A Contusion of right shoulder, initial encounter: Secondary | ICD-10-CM | POA: Diagnosis not present

## 2016-05-29 DIAGNOSIS — T148 Other injury of unspecified body region: Secondary | ICD-10-CM | POA: Diagnosis not present

## 2016-06-16 DIAGNOSIS — R350 Frequency of micturition: Secondary | ICD-10-CM | POA: Diagnosis not present

## 2016-06-28 DIAGNOSIS — Z23 Encounter for immunization: Secondary | ICD-10-CM | POA: Diagnosis not present

## 2016-07-04 DIAGNOSIS — J329 Chronic sinusitis, unspecified: Secondary | ICD-10-CM | POA: Diagnosis not present

## 2016-08-31 DIAGNOSIS — H524 Presbyopia: Secondary | ICD-10-CM | POA: Diagnosis not present

## 2016-08-31 DIAGNOSIS — H01001 Unspecified blepharitis right upper eyelid: Secondary | ICD-10-CM | POA: Diagnosis not present

## 2016-08-31 DIAGNOSIS — H2513 Age-related nuclear cataract, bilateral: Secondary | ICD-10-CM | POA: Diagnosis not present

## 2016-08-31 DIAGNOSIS — H04123 Dry eye syndrome of bilateral lacrimal glands: Secondary | ICD-10-CM | POA: Diagnosis not present

## 2016-08-31 DIAGNOSIS — H01002 Unspecified blepharitis right lower eyelid: Secondary | ICD-10-CM | POA: Diagnosis not present

## 2016-09-17 DIAGNOSIS — Z23 Encounter for immunization: Secondary | ICD-10-CM | POA: Diagnosis not present

## 2016-10-06 DIAGNOSIS — L821 Other seborrheic keratosis: Secondary | ICD-10-CM | POA: Diagnosis not present

## 2016-10-06 DIAGNOSIS — L720 Epidermal cyst: Secondary | ICD-10-CM | POA: Diagnosis not present

## 2016-10-06 DIAGNOSIS — L814 Other melanin hyperpigmentation: Secondary | ICD-10-CM | POA: Diagnosis not present

## 2016-10-06 DIAGNOSIS — L812 Freckles: Secondary | ICD-10-CM | POA: Diagnosis not present

## 2016-10-06 DIAGNOSIS — D1801 Hemangioma of skin and subcutaneous tissue: Secondary | ICD-10-CM | POA: Diagnosis not present

## 2016-10-06 DIAGNOSIS — D225 Melanocytic nevi of trunk: Secondary | ICD-10-CM | POA: Diagnosis not present

## 2016-10-06 DIAGNOSIS — L570 Actinic keratosis: Secondary | ICD-10-CM | POA: Diagnosis not present

## 2016-10-12 DIAGNOSIS — J019 Acute sinusitis, unspecified: Secondary | ICD-10-CM | POA: Diagnosis not present

## 2016-11-20 ENCOUNTER — Other Ambulatory Visit: Payer: Self-pay | Admitting: Family Medicine

## 2016-11-20 DIAGNOSIS — Z1231 Encounter for screening mammogram for malignant neoplasm of breast: Secondary | ICD-10-CM

## 2016-11-22 DIAGNOSIS — N6320 Unspecified lump in the left breast, unspecified quadrant: Secondary | ICD-10-CM | POA: Diagnosis not present

## 2016-11-22 DIAGNOSIS — R58 Hemorrhage, not elsewhere classified: Secondary | ICD-10-CM | POA: Diagnosis not present

## 2016-12-06 ENCOUNTER — Ambulatory Visit: Payer: BC Managed Care – PPO

## 2016-12-25 ENCOUNTER — Ambulatory Visit
Admission: RE | Admit: 2016-12-25 | Discharge: 2016-12-25 | Disposition: A | Discharge status: Home / Self Care | Payer: BC Managed Care – PPO | Source: Ambulatory Visit | Attending: Family Medicine | Admitting: Family Medicine

## 2016-12-25 DIAGNOSIS — Z1231 Encounter for screening mammogram for malignant neoplasm of breast: Secondary | ICD-10-CM

## 2017-02-14 ENCOUNTER — Encounter: Payer: Self-pay | Admitting: Gynecology

## 2017-03-15 DIAGNOSIS — H2513 Age-related nuclear cataract, bilateral: Secondary | ICD-10-CM | POA: Diagnosis not present

## 2017-03-15 DIAGNOSIS — H524 Presbyopia: Secondary | ICD-10-CM | POA: Diagnosis not present

## 2017-05-16 DIAGNOSIS — H903 Sensorineural hearing loss, bilateral: Secondary | ICD-10-CM | POA: Diagnosis not present

## 2017-05-16 DIAGNOSIS — H6983 Other specified disorders of Eustachian tube, bilateral: Secondary | ICD-10-CM | POA: Diagnosis not present

## 2017-05-17 DIAGNOSIS — Z13220 Encounter for screening for lipoid disorders: Secondary | ICD-10-CM | POA: Diagnosis not present

## 2017-05-17 DIAGNOSIS — I10 Essential (primary) hypertension: Secondary | ICD-10-CM | POA: Diagnosis not present

## 2017-05-21 DIAGNOSIS — E7800 Pure hypercholesterolemia, unspecified: Secondary | ICD-10-CM | POA: Diagnosis not present

## 2017-05-21 DIAGNOSIS — I10 Essential (primary) hypertension: Secondary | ICD-10-CM | POA: Diagnosis not present

## 2017-05-21 DIAGNOSIS — M810 Age-related osteoporosis without current pathological fracture: Secondary | ICD-10-CM | POA: Diagnosis not present

## 2017-05-21 DIAGNOSIS — Z0000 Encounter for general adult medical examination without abnormal findings: Secondary | ICD-10-CM | POA: Diagnosis not present

## 2017-07-03 DIAGNOSIS — M25512 Pain in left shoulder: Secondary | ICD-10-CM | POA: Diagnosis not present

## 2017-07-19 DIAGNOSIS — L433 Subacute (active) lichen planus: Secondary | ICD-10-CM | POA: Diagnosis not present

## 2017-07-19 DIAGNOSIS — L438 Other lichen planus: Secondary | ICD-10-CM | POA: Diagnosis not present

## 2017-07-19 DIAGNOSIS — D2272 Melanocytic nevi of left lower limb, including hip: Secondary | ICD-10-CM | POA: Diagnosis not present

## 2017-07-19 DIAGNOSIS — D2271 Melanocytic nevi of right lower limb, including hip: Secondary | ICD-10-CM | POA: Diagnosis not present

## 2017-08-01 DIAGNOSIS — M25512 Pain in left shoulder: Secondary | ICD-10-CM | POA: Diagnosis not present

## 2017-08-30 DIAGNOSIS — M25512 Pain in left shoulder: Secondary | ICD-10-CM | POA: Diagnosis not present

## 2017-09-03 DIAGNOSIS — J0100 Acute maxillary sinusitis, unspecified: Secondary | ICD-10-CM | POA: Diagnosis not present

## 2017-09-03 DIAGNOSIS — R030 Elevated blood-pressure reading, without diagnosis of hypertension: Secondary | ICD-10-CM | POA: Diagnosis not present

## 2017-09-03 DIAGNOSIS — J029 Acute pharyngitis, unspecified: Secondary | ICD-10-CM | POA: Diagnosis not present

## 2017-11-12 DIAGNOSIS — J0100 Acute maxillary sinusitis, unspecified: Secondary | ICD-10-CM | POA: Diagnosis not present

## 2017-11-22 DIAGNOSIS — H01001 Unspecified blepharitis right upper eyelid: Secondary | ICD-10-CM | POA: Diagnosis not present

## 2017-11-22 DIAGNOSIS — H04123 Dry eye syndrome of bilateral lacrimal glands: Secondary | ICD-10-CM | POA: Diagnosis not present

## 2017-11-22 DIAGNOSIS — H01002 Unspecified blepharitis right lower eyelid: Secondary | ICD-10-CM | POA: Diagnosis not present

## 2017-11-22 DIAGNOSIS — H2513 Age-related nuclear cataract, bilateral: Secondary | ICD-10-CM | POA: Diagnosis not present

## 2017-12-27 DIAGNOSIS — H02831 Dermatochalasis of right upper eyelid: Secondary | ICD-10-CM | POA: Diagnosis not present

## 2017-12-27 DIAGNOSIS — H02413 Mechanical ptosis of bilateral eyelids: Secondary | ICD-10-CM | POA: Diagnosis not present

## 2017-12-27 DIAGNOSIS — H02834 Dermatochalasis of left upper eyelid: Secondary | ICD-10-CM | POA: Diagnosis not present

## 2017-12-27 DIAGNOSIS — H02423 Myogenic ptosis of bilateral eyelids: Secondary | ICD-10-CM | POA: Diagnosis not present

## 2018-04-11 DIAGNOSIS — M810 Age-related osteoporosis without current pathological fracture: Secondary | ICD-10-CM | POA: Diagnosis not present

## 2018-04-11 DIAGNOSIS — M6740 Ganglion, unspecified site: Secondary | ICD-10-CM | POA: Diagnosis not present

## 2018-04-11 DIAGNOSIS — I10 Essential (primary) hypertension: Secondary | ICD-10-CM | POA: Diagnosis not present

## 2018-04-11 DIAGNOSIS — E7800 Pure hypercholesterolemia, unspecified: Secondary | ICD-10-CM | POA: Diagnosis not present

## 2018-04-17 DIAGNOSIS — IMO0002 Reserved for concepts with insufficient information to code with codable children: Secondary | ICD-10-CM | POA: Insufficient documentation

## 2018-04-17 DIAGNOSIS — R229 Localized swelling, mass and lump, unspecified: Secondary | ICD-10-CM | POA: Diagnosis not present

## 2018-04-22 ENCOUNTER — Other Ambulatory Visit: Payer: Self-pay | Admitting: Orthopedic Surgery

## 2018-04-22 DIAGNOSIS — IMO0002 Reserved for concepts with insufficient information to code with codable children: Secondary | ICD-10-CM

## 2018-04-22 DIAGNOSIS — R229 Localized swelling, mass and lump, unspecified: Principal | ICD-10-CM

## 2018-05-10 ENCOUNTER — Ambulatory Visit
Admission: RE | Admit: 2018-05-10 | Discharge: 2018-05-10 | Disposition: A | Discharge status: Home / Self Care | Payer: BC Managed Care – PPO | Source: Ambulatory Visit | Attending: Orthopedic Surgery | Admitting: Orthopedic Surgery

## 2018-05-10 DIAGNOSIS — IMO0002 Reserved for concepts with insufficient information to code with codable children: Secondary | ICD-10-CM

## 2018-05-10 DIAGNOSIS — R2231 Localized swelling, mass and lump, right upper limb: Secondary | ICD-10-CM | POA: Diagnosis not present

## 2018-05-10 DIAGNOSIS — R229 Localized swelling, mass and lump, unspecified: Principal | ICD-10-CM

## 2018-05-15 DIAGNOSIS — R229 Localized swelling, mass and lump, unspecified: Secondary | ICD-10-CM | POA: Diagnosis not present

## 2018-05-16 DIAGNOSIS — H6983 Other specified disorders of Eustachian tube, bilateral: Secondary | ICD-10-CM | POA: Diagnosis not present

## 2018-05-16 DIAGNOSIS — H9312 Tinnitus, left ear: Secondary | ICD-10-CM | POA: Diagnosis not present

## 2018-05-16 DIAGNOSIS — H903 Sensorineural hearing loss, bilateral: Secondary | ICD-10-CM | POA: Diagnosis not present

## 2018-05-22 ENCOUNTER — Other Ambulatory Visit: Payer: Self-pay | Admitting: Orthopedic Surgery

## 2018-05-22 DIAGNOSIS — E7800 Pure hypercholesterolemia, unspecified: Secondary | ICD-10-CM | POA: Diagnosis not present

## 2018-05-22 DIAGNOSIS — M6740 Ganglion, unspecified site: Secondary | ICD-10-CM | POA: Diagnosis not present

## 2018-05-22 DIAGNOSIS — I10 Essential (primary) hypertension: Secondary | ICD-10-CM | POA: Diagnosis not present

## 2018-05-22 DIAGNOSIS — M810 Age-related osteoporosis without current pathological fracture: Secondary | ICD-10-CM | POA: Diagnosis not present

## 2018-05-24 ENCOUNTER — Other Ambulatory Visit: Payer: Self-pay

## 2018-05-24 ENCOUNTER — Encounter (HOSPITAL_BASED_OUTPATIENT_CLINIC_OR_DEPARTMENT_OTHER): Payer: Self-pay | Admitting: *Deleted

## 2018-05-27 ENCOUNTER — Encounter (HOSPITAL_BASED_OUTPATIENT_CLINIC_OR_DEPARTMENT_OTHER)
Admission: RE | Admit: 2018-05-27 | Discharge: 2018-05-27 | Disposition: A | Discharge status: Home / Self Care | Payer: Medicare Other | Source: Ambulatory Visit | Attending: Orthopedic Surgery | Admitting: Orthopedic Surgery

## 2018-05-27 DIAGNOSIS — M810 Age-related osteoporosis without current pathological fracture: Secondary | ICD-10-CM | POA: Diagnosis not present

## 2018-05-27 DIAGNOSIS — Z7982 Long term (current) use of aspirin: Secondary | ICD-10-CM | POA: Diagnosis not present

## 2018-05-27 DIAGNOSIS — H9192 Unspecified hearing loss, left ear: Secondary | ICD-10-CM | POA: Diagnosis not present

## 2018-05-27 DIAGNOSIS — Z79899 Other long term (current) drug therapy: Secondary | ICD-10-CM | POA: Diagnosis not present

## 2018-05-27 DIAGNOSIS — M65841 Other synovitis and tenosynovitis, right hand: Secondary | ICD-10-CM | POA: Diagnosis not present

## 2018-05-27 DIAGNOSIS — I10 Essential (primary) hypertension: Secondary | ICD-10-CM

## 2018-05-27 DIAGNOSIS — Z01810 Encounter for preprocedural cardiovascular examination: Secondary | ICD-10-CM

## 2018-05-27 DIAGNOSIS — R229 Localized swelling, mass and lump, unspecified: Secondary | ICD-10-CM | POA: Diagnosis present

## 2018-05-28 DIAGNOSIS — I10 Essential (primary) hypertension: Secondary | ICD-10-CM | POA: Diagnosis not present

## 2018-05-28 DIAGNOSIS — E559 Vitamin D deficiency, unspecified: Secondary | ICD-10-CM | POA: Diagnosis not present

## 2018-05-28 DIAGNOSIS — J309 Allergic rhinitis, unspecified: Secondary | ICD-10-CM | POA: Diagnosis not present

## 2018-05-28 DIAGNOSIS — Z0000 Encounter for general adult medical examination without abnormal findings: Secondary | ICD-10-CM | POA: Diagnosis not present

## 2018-05-29 DIAGNOSIS — H2513 Age-related nuclear cataract, bilateral: Secondary | ICD-10-CM | POA: Diagnosis not present

## 2018-05-29 DIAGNOSIS — H01002 Unspecified blepharitis right lower eyelid: Secondary | ICD-10-CM | POA: Diagnosis not present

## 2018-05-29 DIAGNOSIS — H01001 Unspecified blepharitis right upper eyelid: Secondary | ICD-10-CM | POA: Diagnosis not present

## 2018-05-29 DIAGNOSIS — H04123 Dry eye syndrome of bilateral lacrimal glands: Secondary | ICD-10-CM | POA: Diagnosis not present

## 2018-05-30 ENCOUNTER — Encounter (HOSPITAL_BASED_OUTPATIENT_CLINIC_OR_DEPARTMENT_OTHER): Payer: Self-pay

## 2018-05-30 ENCOUNTER — Ambulatory Visit (HOSPITAL_BASED_OUTPATIENT_CLINIC_OR_DEPARTMENT_OTHER)
Admission: RE | Admit: 2018-05-30 | Discharge: 2018-05-30 | Disposition: A | Discharge status: Home / Self Care | Payer: Medicare Other | Source: Ambulatory Visit | Attending: Orthopedic Surgery | Admitting: Orthopedic Surgery

## 2018-05-30 ENCOUNTER — Other Ambulatory Visit: Payer: Self-pay

## 2018-05-30 ENCOUNTER — Ambulatory Visit (HOSPITAL_BASED_OUTPATIENT_CLINIC_OR_DEPARTMENT_OTHER): Payer: Medicare Other | Admitting: Anesthesiology

## 2018-05-30 ENCOUNTER — Encounter (HOSPITAL_BASED_OUTPATIENT_CLINIC_OR_DEPARTMENT_OTHER)
Admission: RE | Disposition: A | Discharge status: Home / Self Care | Payer: Self-pay | Source: Ambulatory Visit | Attending: Orthopedic Surgery

## 2018-05-30 DIAGNOSIS — D481 Neoplasm of uncertain behavior of connective and other soft tissue: Secondary | ICD-10-CM | POA: Diagnosis not present

## 2018-05-30 DIAGNOSIS — M810 Age-related osteoporosis without current pathological fracture: Secondary | ICD-10-CM | POA: Diagnosis not present

## 2018-05-30 DIAGNOSIS — Z79899 Other long term (current) drug therapy: Secondary | ICD-10-CM | POA: Insufficient documentation

## 2018-05-30 DIAGNOSIS — Z7982 Long term (current) use of aspirin: Secondary | ICD-10-CM | POA: Diagnosis not present

## 2018-05-30 DIAGNOSIS — H9192 Unspecified hearing loss, left ear: Secondary | ICD-10-CM | POA: Insufficient documentation

## 2018-05-30 DIAGNOSIS — M1990 Unspecified osteoarthritis, unspecified site: Secondary | ICD-10-CM | POA: Diagnosis not present

## 2018-05-30 DIAGNOSIS — I10 Essential (primary) hypertension: Secondary | ICD-10-CM | POA: Diagnosis not present

## 2018-05-30 DIAGNOSIS — R2231 Localized swelling, mass and lump, right upper limb: Secondary | ICD-10-CM | POA: Diagnosis not present

## 2018-05-30 DIAGNOSIS — M65841 Other synovitis and tenosynovitis, right hand: Secondary | ICD-10-CM | POA: Diagnosis not present

## 2018-05-30 HISTORY — PX: MASS EXCISION: SHX2000

## 2018-05-30 HISTORY — DX: Unspecified osteoarthritis, unspecified site: M19.90

## 2018-05-30 SURGERY — EXCISION MASS
Anesthesia: Monitor Anesthesia Care | Site: Hand | Laterality: Right

## 2018-05-30 MED ORDER — BUPIVACAINE HCL (PF) 0.25 % IJ SOLN
INTRAMUSCULAR | Status: DC | PRN
  Administered 2018-05-30: 7 mL

## 2018-05-30 MED ORDER — ONDANSETRON HCL 4 MG/2ML IJ SOLN
INTRAMUSCULAR | Status: AC
  Filled 2018-05-30: qty 2

## 2018-05-30 MED ORDER — MIDAZOLAM HCL 2 MG/2ML IJ SOLN
1.0000 mg | INTRAMUSCULAR | Status: DC | PRN
  Administered 2018-05-30: 1 mg via INTRAVENOUS

## 2018-05-30 MED ORDER — TRAMADOL HCL 50 MG PO TABS: 50.0000 mg | ORAL_TABLET | Freq: Four times a day (QID) | ORAL | 0 refills | Status: DC | PRN

## 2018-05-30 MED ORDER — ONDANSETRON HCL 4 MG/2ML IJ SOLN: 4.0000 mg | Freq: Once | INTRAMUSCULAR | Status: DC | PRN

## 2018-05-30 MED ORDER — FENTANYL CITRATE (PF) 100 MCG/2ML IJ SOLN: 50.0000 ug | INTRAMUSCULAR | Status: DC | PRN

## 2018-05-30 MED ORDER — LIDOCAINE HCL (PF) 0.5 % IJ SOLN
INTRAMUSCULAR | Status: DC | PRN
  Administered 2018-05-30: 30 mL via INTRAVENOUS

## 2018-05-30 MED ORDER — LACTATED RINGERS IV SOLN
INTRAVENOUS | Status: DC
  Administered 2018-05-30: 08:00:00 via INTRAVENOUS

## 2018-05-30 MED ORDER — VANCOMYCIN HCL IN DEXTROSE 1-5 GM/200ML-% IV SOLN
INTRAVENOUS | Status: AC
  Filled 2018-05-30: qty 200

## 2018-05-30 MED ORDER — FENTANYL CITRATE (PF) 100 MCG/2ML IJ SOLN: 25.0000 ug | INTRAMUSCULAR | Status: DC | PRN

## 2018-05-30 MED ORDER — VANCOMYCIN HCL IN DEXTROSE 1-5 GM/200ML-% IV SOLN
1000.0000 mg | INTRAVENOUS | Status: AC
  Administered 2018-05-30: 1000 mg via INTRAVENOUS

## 2018-05-30 MED ORDER — LIDOCAINE HCL (PF) 1 % IJ SOLN
INTRAMUSCULAR | Status: AC
  Filled 2018-05-30: qty 30

## 2018-05-30 MED ORDER — CHLORHEXIDINE GLUCONATE 4 % EX LIQD: 60.0000 mL | Freq: Once | CUTANEOUS | Status: DC

## 2018-05-30 MED ORDER — BUPIVACAINE HCL (PF) 0.25 % IJ SOLN
INTRAMUSCULAR | Status: AC
  Filled 2018-05-30: qty 150

## 2018-05-30 MED ORDER — MIDAZOLAM HCL 2 MG/2ML IJ SOLN
INTRAMUSCULAR | Status: AC
  Filled 2018-05-30: qty 2

## 2018-05-30 MED ORDER — LIDOCAINE HCL (CARDIAC) PF 100 MG/5ML IV SOSY
PREFILLED_SYRINGE | INTRAVENOUS | Status: DC | PRN
  Administered 2018-05-30: 30 mg via INTRAVENOUS

## 2018-05-30 MED ORDER — PROPOFOL 500 MG/50ML IV EMUL
INTRAVENOUS | Status: DC | PRN
  Administered 2018-05-30: 50 ug/kg/min via INTRAVENOUS

## 2018-05-30 MED ORDER — SCOPOLAMINE 1 MG/3DAYS TD PT72: 1.0000 | MEDICATED_PATCH | Freq: Once | TRANSDERMAL | Status: DC | PRN

## 2018-05-30 SURGICAL SUPPLY — 43 items
BANDAGE COBAN STERILE 2 (GAUZE/BANDAGES/DRESSINGS) IMPLANT
BLADE SURG 15 STRL LF DISP TIS (BLADE) ×1 IMPLANT
BLADE SURG 15 STRL SS (BLADE) ×2
BNDG COHESIVE 1X5 TAN STRL LF (GAUZE/BANDAGES/DRESSINGS) ×3 IMPLANT
BNDG COHESIVE 3X5 TAN STRL LF (GAUZE/BANDAGES/DRESSINGS) IMPLANT
BNDG ESMARK 4X9 LF (GAUZE/BANDAGES/DRESSINGS) IMPLANT
BNDG GAUZE ELAST 4 BULKY (GAUZE/BANDAGES/DRESSINGS) IMPLANT
CHLORAPREP W/TINT 26ML (MISCELLANEOUS) ×3 IMPLANT
CORD BIPOLAR FORCEPS 12FT (ELECTRODE) ×3 IMPLANT
COVER BACK TABLE 60X90IN (DRAPES) ×3 IMPLANT
COVER MAYO STAND STRL (DRAPES) ×3 IMPLANT
CUFF TOURNIQUET SINGLE 18IN (TOURNIQUET CUFF) ×3 IMPLANT
DECANTER SPIKE VIAL GLASS SM (MISCELLANEOUS) IMPLANT
DRAIN PENROSE 1/2X12 LTX STRL (WOUND CARE) IMPLANT
DRAPE EXTREMITY T 121X128X90 (DRAPE) ×3 IMPLANT
DRAPE SURG 17X23 STRL (DRAPES) ×3 IMPLANT
GAUZE SPONGE 4X4 12PLY STRL (GAUZE/BANDAGES/DRESSINGS) ×3 IMPLANT
GAUZE XEROFORM 1X8 LF (GAUZE/BANDAGES/DRESSINGS) ×3 IMPLANT
GLOVE BIOGEL PI IND STRL 7.0 (GLOVE) ×1 IMPLANT
GLOVE BIOGEL PI IND STRL 8.5 (GLOVE) ×1 IMPLANT
GLOVE BIOGEL PI INDICATOR 7.0 (GLOVE) ×2
GLOVE BIOGEL PI INDICATOR 8.5 (GLOVE) ×2
GLOVE SURG ORTHO 8.0 STRL STRW (GLOVE) ×3 IMPLANT
GOWN STRL REUS W/ TWL LRG LVL3 (GOWN DISPOSABLE) ×1 IMPLANT
GOWN STRL REUS W/TWL LRG LVL3 (GOWN DISPOSABLE) ×2
GOWN STRL REUS W/TWL XL LVL3 (GOWN DISPOSABLE) ×3 IMPLANT
NDL SAFETY ECLIPSE 18X1.5 (NEEDLE) IMPLANT
NEEDLE HYPO 18GX1.5 SHARP (NEEDLE)
NEEDLE PRECISIONGLIDE 27X1.5 (NEEDLE) ×3 IMPLANT
NS IRRIG 1000ML POUR BTL (IV SOLUTION) ×3 IMPLANT
PACK BASIN DAY SURGERY FS (CUSTOM PROCEDURE TRAY) ×3 IMPLANT
PAD CAST 3X4 CTTN HI CHSV (CAST SUPPLIES) IMPLANT
PADDING CAST COTTON 3X4 STRL (CAST SUPPLIES)
SPLINT FINGER 3.25 911903 (SOFTGOODS) ×3 IMPLANT
SPLINT PLASTER CAST XFAST 3X15 (CAST SUPPLIES) IMPLANT
SPLINT PLASTER XTRA FASTSET 3X (CAST SUPPLIES)
STOCKINETTE 4X48 STRL (DRAPES) ×3 IMPLANT
SUT ETHILON 4 0 PS 2 18 (SUTURE) ×3 IMPLANT
SUT VIC AB 4-0 P2 18 (SUTURE) IMPLANT
SYR BULB 3OZ (MISCELLANEOUS) ×3 IMPLANT
SYR CONTROL 10ML LL (SYRINGE) ×3 IMPLANT
TOWEL GREEN STERILE FF (TOWEL DISPOSABLE) ×3 IMPLANT
UNDERPAD 30X30 (UNDERPADS AND DIAPERS) ×3 IMPLANT

## 2018-05-30 NOTE — Brief Op Note (Signed)
05/30/2018  9:06 AM  PATIENT:  Hannah Fisher  67 y.o. female  PRE-OPERATIVE DIAGNOSIS:  MASS RIGHT SMALL FINGER  POST-OPERATIVE DIAGNOSIS:  MASS RIGHT SMALL FINGER  PROCEDURE:  Procedure(s): EXCISION MASS RIGHT SMALL FINGER (Right)  SURGEON:  Surgeon(s) and Role:    Cindee Salt* Kenard Morawski, MD - Primary  PHYSICIAN ASSISTANT:   ASSISTANTS: none   ANESTHESIA:   local, regional and IV sedation  EBL:  1`ml BLOOD ADMINISTERED:none  DRAINS: none   LOCAL MEDICATIONS USED:  BUPIVICAINE   SPECIMEN:  Excision  DISPOSITION OF SPECIMEN:  PATHOLOGY  COUNTS:  YES  TOURNIQUET:   Total Tourniquet Time Documented: Forearm (Right) - 26 minutes Total: Forearm (Right) - 26 minutes   DICTATION: .Reubin Milanragon Dictation  PLAN OF CARE: Discharge to home after PACU  PATIENT DISPOSITION:  PACU - hemodynamically stable.

## 2018-05-30 NOTE — Op Note (Signed)
NAME: Hannah Fisher MEDICAL RECORD NO: [id number] DATE OF BIRTH: 1951-09-18 [facility] PHYSICIAN: Nicki ReaperGARY R. Ennis Delpozo, MD   OPERATIVE REPORT   DATE OF PROCEDURE: 05/30/18    PREOPERATIVE DIAGNOSIS:   Mass right small finger   POSTOPERATIVE DIAGNOSIS:   Same   PROCEDURE:   Excision mass right small finger   SURGEON: Cindee SaltGary Makeshia Seat, M.D.   ASSISTANT: none   ANESTHESIA:  Bier block with sedation and Local   INTRAVENOUS FLUIDS:  Per anesthesia flow sheet.   ESTIMATED BLOOD LOSS:  Minimal.   COMPLICATIONS:  None.   SPECIMENS:   Excision mass   TOURNIQUET TIME:    Total Tourniquet Time Documented: Forearm (Right) - 26 minutes Total: Forearm (Right) - 26 minutes    DISPOSITION:  Stable to PACU.   INDICATIONS: Patient is a 67 year old female with a mass at the PIP joint level of her right small finger.  This been gradually enlarging ultrasound reveals this to be solid.  She is admitted for excision.  Pre-peri-and postoperative course been discussed along with risks and comp occasions.  She is aware there is no guarantee to the surgery the possibility of infection recurrence injury to arteries nerves tendons complete relief symptoms dystrophy.  In the preoperative area the patient seen extremity marked by both patient and surgeon antibiotic given  OPERATIVE COURSE: Patient is brought to the operating room where a forearm-based IV regional anesthetic was carried out without difficulty.  She was prepped with ChloraPrep in a supine position with the right arm free.  A three-minute dry time was allowed timeout taken to confirm patient procedure.  A metacarpal block was given quarter percent bupivacaine without epinephrine approximately 8 cc was used.  A volar Bruner incision was made carried down through subcutaneous tissue.  The digital nerve arteries found to be draped over the palmar aspect of the mass found to be intact over its entire course.   This was gently dissected free from a multilobulated brown-yellow mass measuring approximately the 9 mm in diameter this arose from the flexor sheath.  With the protection of the nerve it was dissected free and removed entirely.  The artery nerve was present and intact over its entire course.  The wound was copiously irrigated with saline.  The skin was then closed interrupted 4 nylon sutures.  A sterile compressive dressing and splint to the finger applied.  Inflation of the tourniquet remaining fingers pink.  She was taken to the recovery room for observation in satisfactory condition.  She will be discharged home [facility] on Tylenol and ibuprofen for pain with back-up of Ultram.   Nicki ReaperKUZMA,Aarya Robinson R, MD Electronically signed, 05/30/18

## 2018-05-30 NOTE — H&P (Signed)
  Hannah Fisher is an 67 y.o. female.   Chief Complaint: mass right small fingerHPI:Hannah Fisher is a 67yo female complaining of a mass on her Right small finger and right index finger. Neither cause pain or discomfort of each is been present for a month the small finger is been enlarging. It does not cause any pain for her. She has not lost any mobility to it. She has no history of diabetes thyroid problems arthritis or gout. Family history is negative for each of these also. She was sent for ultrasound to determine whether this mass on her small finger was solid or cystic along with the index. The mass is not causing any significant discomfort for her. She complains of some discomfort on the ulnar aspect of the small finger but the mass is on the radial aspect PIP joint. Her ultrasound was read out by Dr. Jena GaussMaxwell revealing arthritic changes in the index finger with no masses and a 7 x 7 x 5 mm solid well marginated soft tissue mass on the radial aspect of the flexor tendon to the small finger right hand     Past Medical History:  Diagnosis Date  . Arthritis    hands, knees  . Deafness    left ear sue to shingles  . Hypertension   . Osteoporosis 2009   -2.6  . Shingles     Past Surgical History:  Procedure Laterality Date  . TONSILLECTOMY    . VAGINAL HYSTERECTOMY  1992    Family History  Problem Relation Age of Onset  . Hypertension Mother   . Stroke Mother   . Hypertension Sister   . Hypertension Brother    Social History:  reports that she has never smoked. She has never used smokeless tobacco. She reports that she drinks alcohol. She reports that she does not use drugs.  Allergies:  Allergies  Allergen Reactions  . Penicillins Hives  . Prednisone     Tightness in chest, trouble breathing chest pressure  . Sulfa Antibiotics     No medications prior to admission.    No results found for this or any previous visit (from the past 48 hour(s)).  No results  found.   Pertinent items are noted in HPI.  Height 5\' 1"  (1.549 m), weight 48.5 kg, last menstrual period 10/02/1990.  General appearance: alert, cooperative and appears stated age Head: Normocephalic, without obvious abnormality Neck: no JVD Resp: clear to auscultation bilaterally Cardio: regular rate and rhythm, S1, S2 normal, no murmur, click, rub or gallop GI: soft, non-tender; bowel sounds normal; no masses,  no organomegaly Extremities: mass right small finger Pulses: 2+ and symmetric Skin: Skin color, texture, turgor normal. No rashes or lesions Neurologic: Grossly normal Incision/Wound: na  Assessment/Plan Assessment:  1. Mass right small finger   Plan: Have reviewed the ultrasound with her. We recommend excision of the mass. Would not recommend any treatment to the index finger. She would like to have this done. Pre-peri-and postoperative course been discussed along with risk complications. She is aware there is no guarantee to the surgery the possibility of infection recurrence injury to arteries nerves tendons complete relief symptoms dystrophy. She is scheduled for excision mass right small finger as an outpatient under regional anesthesia.   Hannah Fisher R 05/30/2018, 5:25 AM

## 2018-05-30 NOTE — Transfer of Care (Signed)
Immediate Anesthesia Transfer of Care Note  Patient: Hannah Fisher  Procedure(s) Performed: EXCISION MASS RIGHT SMALL FINGER (Right Hand)  Patient Location: PACU  Anesthesia Type:Bier block  Level of Consciousness: awake, oriented and patient cooperative  Airway & Oxygen Therapy: Patient Spontanous Breathing  Post-op Assessment: Report given to RN and Post -op Vital signs reviewed and stable  Post vital signs: Reviewed and stable  Last Vitals:  Vitals Value Taken Time  BP 132/95 05/30/2018  9:09 AM  Temp    Pulse 79 05/30/2018  9:10 AM  Resp 19 05/30/2018  9:10 AM  SpO2 100 % 05/30/2018  9:10 AM  Vitals shown include unvalidated device data.  Last Pain:  Vitals:   05/30/18 0729  TempSrc: Oral  PainSc: 0-No pain      Patients Stated Pain Goal: 4 (05/30/18 0729)  Complications: No apparent anesthesia complications

## 2018-05-30 NOTE — Discharge Instructions (Addendum)

## 2018-05-30 NOTE — Anesthesia Preprocedure Evaluation (Addendum)
Anesthesia Evaluation  Patient identified by MRN, date of birth, ID band Patient awake    Reviewed: Allergy & Precautions, NPO status , Patient's Chart, lab work & pertinent test results  Airway Mallampati: II  TM Distance: >3 FB Neck ROM: Full    Dental  (+) Teeth Intact, Dental Advisory Given   Pulmonary neg pulmonary ROS,    Pulmonary exam normal breath sounds clear to auscultation       Cardiovascular hypertension, Pt. on medications Normal cardiovascular exam Rhythm:Regular Rate:Normal     Neuro/Psych negative neurological ROS  negative psych ROS   GI/Hepatic negative GI ROS, Neg liver ROS,   Endo/Other  negative endocrine ROS  Renal/GU negative Renal ROS     Musculoskeletal  (+) Arthritis , MASS RIGHT SMALL FINGER   Abdominal   Peds  Hematology negative hematology ROS (+)   Anesthesia Other Findings Day of surgery medications reviewed with the patient.  Reproductive/Obstetrics                             Anesthesia Physical Anesthesia Plan  ASA: II  Anesthesia Plan: Bier Block and MAC and Bier Block-LIDOCAINE ONLY   Post-op Pain Management:    Induction:   PONV Risk Score and Plan: 2 and Propofol infusion and Midazolam  Airway Management Planned: Natural Airway and Nasal Cannula  Additional Equipment:   Intra-op Plan:   Post-operative Plan:   Informed Consent: I have reviewed the patients History and Physical, chart, labs and discussed the procedure including the risks, benefits and alternatives for the proposed anesthesia with the patient or authorized representative who has indicated his/her understanding and acceptance.   Dental advisory given  Plan Discussed with: CRNA  Anesthesia Plan Comments:         Anesthesia Quick Evaluation

## 2018-05-31 ENCOUNTER — Encounter (HOSPITAL_BASED_OUTPATIENT_CLINIC_OR_DEPARTMENT_OTHER): Payer: Self-pay | Admitting: Orthopedic Surgery

## 2018-05-31 NOTE — Anesthesia Postprocedure Evaluation (Signed)
Anesthesia Post Note  Patient: Hannah Fisher  Procedure(s) Performed: EXCISION MASS RIGHT SMALL FINGER (Right Hand)     Patient location during evaluation: PACU Anesthesia Type: MAC Level of consciousness: awake and alert, awake and oriented Pain management: pain level controlled Vital Signs Assessment: post-procedure vital signs reviewed and stable Respiratory status: spontaneous breathing, nonlabored ventilation and respiratory function stable Cardiovascular status: stable and blood pressure returned to baseline Postop Assessment: no apparent nausea or vomiting Anesthetic complications: no    Last Vitals:  Vitals:   05/30/18 0925 05/30/18 0956  BP:    Pulse: 73 71  Resp: 17 18  Temp:  36.7 C  SpO2: 100% 100%    Last Pain:  Vitals:   05/30/18 0956  TempSrc:   PainSc: 0-No pain                 Catalina Gravel

## 2018-08-01 DIAGNOSIS — L814 Other melanin hyperpigmentation: Secondary | ICD-10-CM | POA: Diagnosis not present

## 2018-08-01 DIAGNOSIS — D1801 Hemangioma of skin and subcutaneous tissue: Secondary | ICD-10-CM | POA: Diagnosis not present

## 2018-08-01 DIAGNOSIS — L821 Other seborrheic keratosis: Secondary | ICD-10-CM | POA: Diagnosis not present

## 2018-09-10 DIAGNOSIS — J320 Chronic maxillary sinusitis: Secondary | ICD-10-CM | POA: Diagnosis not present

## 2018-09-18 DIAGNOSIS — H01002 Unspecified blepharitis right lower eyelid: Secondary | ICD-10-CM | POA: Diagnosis not present

## 2018-09-18 DIAGNOSIS — H2513 Age-related nuclear cataract, bilateral: Secondary | ICD-10-CM | POA: Diagnosis not present

## 2018-09-18 DIAGNOSIS — H01001 Unspecified blepharitis right upper eyelid: Secondary | ICD-10-CM | POA: Diagnosis not present

## 2018-09-18 DIAGNOSIS — H04123 Dry eye syndrome of bilateral lacrimal glands: Secondary | ICD-10-CM | POA: Diagnosis not present

## 2018-11-11 DIAGNOSIS — R350 Frequency of micturition: Secondary | ICD-10-CM | POA: Diagnosis not present

## 2019-03-20 DIAGNOSIS — M5430 Sciatica, unspecified side: Secondary | ICD-10-CM | POA: Diagnosis not present

## 2019-03-24 DIAGNOSIS — M5430 Sciatica, unspecified side: Secondary | ICD-10-CM | POA: Diagnosis not present

## 2019-04-01 DIAGNOSIS — S99922A Unspecified injury of left foot, initial encounter: Secondary | ICD-10-CM | POA: Diagnosis not present

## 2019-04-07 DIAGNOSIS — H9192 Unspecified hearing loss, left ear: Secondary | ICD-10-CM | POA: Diagnosis not present

## 2019-05-19 DIAGNOSIS — H9113 Presbycusis, bilateral: Secondary | ICD-10-CM | POA: Diagnosis not present

## 2019-05-19 DIAGNOSIS — H9192 Unspecified hearing loss, left ear: Secondary | ICD-10-CM | POA: Diagnosis not present

## 2019-05-19 DIAGNOSIS — H6983 Other specified disorders of Eustachian tube, bilateral: Secondary | ICD-10-CM | POA: Insufficient documentation

## 2019-05-19 DIAGNOSIS — H903 Sensorineural hearing loss, bilateral: Secondary | ICD-10-CM | POA: Diagnosis not present

## 2019-05-28 DIAGNOSIS — H04123 Dry eye syndrome of bilateral lacrimal glands: Secondary | ICD-10-CM | POA: Diagnosis not present

## 2019-05-28 DIAGNOSIS — H01002 Unspecified blepharitis right lower eyelid: Secondary | ICD-10-CM | POA: Diagnosis not present

## 2019-05-28 DIAGNOSIS — H01001 Unspecified blepharitis right upper eyelid: Secondary | ICD-10-CM | POA: Diagnosis not present

## 2019-05-28 DIAGNOSIS — H2513 Age-related nuclear cataract, bilateral: Secondary | ICD-10-CM | POA: Diagnosis not present

## 2019-06-11 DIAGNOSIS — Z23 Encounter for immunization: Secondary | ICD-10-CM | POA: Diagnosis not present

## 2019-06-11 DIAGNOSIS — I10 Essential (primary) hypertension: Secondary | ICD-10-CM | POA: Diagnosis not present

## 2019-06-11 DIAGNOSIS — E559 Vitamin D deficiency, unspecified: Secondary | ICD-10-CM | POA: Diagnosis not present

## 2019-06-11 DIAGNOSIS — Z0000 Encounter for general adult medical examination without abnormal findings: Secondary | ICD-10-CM | POA: Diagnosis not present

## 2019-06-13 DIAGNOSIS — E559 Vitamin D deficiency, unspecified: Secondary | ICD-10-CM | POA: Diagnosis not present

## 2019-06-13 DIAGNOSIS — Z23 Encounter for immunization: Secondary | ICD-10-CM | POA: Diagnosis not present

## 2019-06-13 DIAGNOSIS — I10 Essential (primary) hypertension: Secondary | ICD-10-CM | POA: Diagnosis not present

## 2019-06-13 DIAGNOSIS — R634 Abnormal weight loss: Secondary | ICD-10-CM | POA: Diagnosis not present

## 2019-06-17 ENCOUNTER — Other Ambulatory Visit: Payer: Self-pay | Admitting: Family Medicine

## 2019-06-17 DIAGNOSIS — Z1231 Encounter for screening mammogram for malignant neoplasm of breast: Secondary | ICD-10-CM

## 2019-06-20 DIAGNOSIS — Z20828 Contact with and (suspected) exposure to other viral communicable diseases: Secondary | ICD-10-CM | POA: Diagnosis not present

## 2019-06-23 ENCOUNTER — Ambulatory Visit
Admission: RE | Admit: 2019-06-23 | Discharge: 2019-06-23 | Disposition: A | Discharge status: Home / Self Care | Payer: Medicare Other | Source: Ambulatory Visit | Attending: Family Medicine | Admitting: Family Medicine

## 2019-06-23 ENCOUNTER — Other Ambulatory Visit: Payer: Self-pay

## 2019-06-23 DIAGNOSIS — Z1231 Encounter for screening mammogram for malignant neoplasm of breast: Secondary | ICD-10-CM

## 2019-06-24 ENCOUNTER — Other Ambulatory Visit: Payer: Self-pay | Admitting: Family Medicine

## 2019-06-24 DIAGNOSIS — R928 Other abnormal and inconclusive findings on diagnostic imaging of breast: Secondary | ICD-10-CM

## 2019-06-26 ENCOUNTER — Ambulatory Visit
Admission: RE | Admit: 2019-06-26 | Discharge: 2019-06-26 | Disposition: A | Discharge status: Home / Self Care | Payer: Medicare Other | Source: Ambulatory Visit | Attending: Family Medicine | Admitting: Family Medicine

## 2019-06-26 ENCOUNTER — Other Ambulatory Visit: Payer: Medicare Other

## 2019-06-26 ENCOUNTER — Other Ambulatory Visit: Payer: Self-pay

## 2019-06-26 ENCOUNTER — Other Ambulatory Visit: Payer: Self-pay | Admitting: Family Medicine

## 2019-06-26 DIAGNOSIS — R928 Other abnormal and inconclusive findings on diagnostic imaging of breast: Secondary | ICD-10-CM | POA: Diagnosis not present

## 2019-06-26 DIAGNOSIS — N6001 Solitary cyst of right breast: Secondary | ICD-10-CM | POA: Diagnosis not present

## 2019-06-26 DIAGNOSIS — N6310 Unspecified lump in the right breast, unspecified quadrant: Secondary | ICD-10-CM

## 2019-08-06 DIAGNOSIS — L814 Other melanin hyperpigmentation: Secondary | ICD-10-CM | POA: Diagnosis not present

## 2019-08-06 DIAGNOSIS — L918 Other hypertrophic disorders of the skin: Secondary | ICD-10-CM | POA: Diagnosis not present

## 2019-08-06 DIAGNOSIS — D1801 Hemangioma of skin and subcutaneous tissue: Secondary | ICD-10-CM | POA: Diagnosis not present

## 2019-08-06 DIAGNOSIS — L720 Epidermal cyst: Secondary | ICD-10-CM | POA: Diagnosis not present

## 2019-09-04 DIAGNOSIS — I10 Essential (primary) hypertension: Secondary | ICD-10-CM | POA: Diagnosis not present

## 2019-09-04 DIAGNOSIS — N390 Urinary tract infection, site not specified: Secondary | ICD-10-CM | POA: Diagnosis not present

## 2019-09-30 DIAGNOSIS — J019 Acute sinusitis, unspecified: Secondary | ICD-10-CM | POA: Diagnosis not present

## 2019-09-30 DIAGNOSIS — J3081 Allergic rhinitis due to animal (cat) (dog) hair and dander: Secondary | ICD-10-CM | POA: Diagnosis not present

## 2019-10-07 DIAGNOSIS — M5430 Sciatica, unspecified side: Secondary | ICD-10-CM | POA: Diagnosis not present

## 2019-10-07 DIAGNOSIS — I10 Essential (primary) hypertension: Secondary | ICD-10-CM | POA: Diagnosis not present

## 2019-10-30 ENCOUNTER — Ambulatory Visit: Payer: Medicare Other

## 2019-11-04 ENCOUNTER — Ambulatory Visit: Payer: Medicare Other

## 2019-11-25 DIAGNOSIS — M810 Age-related osteoporosis without current pathological fracture: Secondary | ICD-10-CM | POA: Insufficient documentation

## 2019-12-01 DIAGNOSIS — H0100A Unspecified blepharitis right eye, upper and lower eyelids: Secondary | ICD-10-CM | POA: Diagnosis not present

## 2019-12-01 DIAGNOSIS — H04123 Dry eye syndrome of bilateral lacrimal glands: Secondary | ICD-10-CM | POA: Diagnosis not present

## 2019-12-01 DIAGNOSIS — H02831 Dermatochalasis of right upper eyelid: Secondary | ICD-10-CM | POA: Diagnosis not present

## 2019-12-01 DIAGNOSIS — H2513 Age-related nuclear cataract, bilateral: Secondary | ICD-10-CM | POA: Diagnosis not present

## 2019-12-02 DIAGNOSIS — I10 Essential (primary) hypertension: Secondary | ICD-10-CM | POA: Diagnosis not present

## 2019-12-02 DIAGNOSIS — E7800 Pure hypercholesterolemia, unspecified: Secondary | ICD-10-CM | POA: Diagnosis not present

## 2019-12-02 DIAGNOSIS — R5383 Other fatigue: Secondary | ICD-10-CM | POA: Diagnosis not present

## 2019-12-02 DIAGNOSIS — E559 Vitamin D deficiency, unspecified: Secondary | ICD-10-CM | POA: Diagnosis not present

## 2019-12-02 DIAGNOSIS — R350 Frequency of micturition: Secondary | ICD-10-CM | POA: Diagnosis not present

## 2019-12-03 DIAGNOSIS — H9192 Unspecified hearing loss, left ear: Secondary | ICD-10-CM | POA: Diagnosis not present

## 2019-12-03 DIAGNOSIS — R490 Dysphonia: Secondary | ICD-10-CM | POA: Insufficient documentation

## 2019-12-17 ENCOUNTER — Ambulatory Visit: Payer: BC Managed Care – PPO | Admitting: Obstetrics and Gynecology

## 2019-12-19 DIAGNOSIS — N390 Urinary tract infection, site not specified: Secondary | ICD-10-CM | POA: Diagnosis not present

## 2019-12-25 ENCOUNTER — Other Ambulatory Visit: Payer: Self-pay | Admitting: Family Medicine

## 2019-12-25 ENCOUNTER — Other Ambulatory Visit: Payer: Self-pay

## 2019-12-25 ENCOUNTER — Ambulatory Visit
Admission: RE | Admit: 2019-12-25 | Discharge: 2019-12-25 | Disposition: A | Discharge status: Home / Self Care | Payer: Medicare Other | Source: Ambulatory Visit | Attending: Family Medicine | Admitting: Family Medicine

## 2019-12-25 DIAGNOSIS — G2581 Restless legs syndrome: Secondary | ICD-10-CM | POA: Diagnosis not present

## 2019-12-25 DIAGNOSIS — K2970 Gastritis, unspecified, without bleeding: Secondary | ICD-10-CM | POA: Diagnosis not present

## 2019-12-25 DIAGNOSIS — I10 Essential (primary) hypertension: Secondary | ICD-10-CM | POA: Diagnosis not present

## 2019-12-25 DIAGNOSIS — N6310 Unspecified lump in the right breast, unspecified quadrant: Secondary | ICD-10-CM

## 2019-12-25 DIAGNOSIS — R350 Frequency of micturition: Secondary | ICD-10-CM | POA: Diagnosis not present

## 2019-12-25 DIAGNOSIS — N6311 Unspecified lump in the right breast, upper outer quadrant: Secondary | ICD-10-CM | POA: Diagnosis not present

## 2020-01-09 DIAGNOSIS — M5430 Sciatica, unspecified side: Secondary | ICD-10-CM | POA: Diagnosis not present

## 2020-01-19 DIAGNOSIS — H0100A Unspecified blepharitis right eye, upper and lower eyelids: Secondary | ICD-10-CM | POA: Diagnosis not present

## 2020-01-19 DIAGNOSIS — H2513 Age-related nuclear cataract, bilateral: Secondary | ICD-10-CM | POA: Diagnosis not present

## 2020-01-19 DIAGNOSIS — H02831 Dermatochalasis of right upper eyelid: Secondary | ICD-10-CM | POA: Diagnosis not present

## 2020-01-19 DIAGNOSIS — H04123 Dry eye syndrome of bilateral lacrimal glands: Secondary | ICD-10-CM | POA: Diagnosis not present

## 2020-01-22 ENCOUNTER — Ambulatory Visit (INDEPENDENT_AMBULATORY_CARE_PROVIDER_SITE_OTHER): Payer: Medicare Other | Admitting: Cardiology

## 2020-01-22 ENCOUNTER — Other Ambulatory Visit: Payer: Self-pay

## 2020-01-22 ENCOUNTER — Encounter: Payer: Self-pay | Admitting: Cardiology

## 2020-01-22 VITALS — BP 146/104 | HR 100 | Ht 61.0 in | Wt 100.2 lb

## 2020-01-22 DIAGNOSIS — T733XXA Exhaustion due to excessive exertion, initial encounter: Secondary | ICD-10-CM

## 2020-01-22 DIAGNOSIS — I10 Essential (primary) hypertension: Secondary | ICD-10-CM | POA: Diagnosis not present

## 2020-01-22 MED ORDER — CHLORTHALIDONE 25 MG PO TABS: 25.0000 mg | ORAL_TABLET | Freq: Every day | ORAL | 3 refills | Status: DC

## 2020-01-22 NOTE — Progress Notes (Signed)
Cardiology Consult Note    Date:  01/22/2020   ID:  SIERRIA Fisher, DOB 06-21-1951, MRN [id number]  PCP:  Daisy Floro, MD  Cardiologist:  Armanda Magic, MD   No chief complaint on file.   History of Present Illness:  Hannah Fisher is a 69 y.o. female who is being seen today for the evaluation of chest discomfort at the request of Daisy Floro, MD.  This is a 69yo female with a hx of HTN who was recently seen by Dr. Tenny Craw for routine PE and mentioned to him that her Bp recently has been getting higher.  She also has noticed that she is urinating more frequently and has an appt with urology tomorrow.  She says that her BP has been controlled on Lisinopril 2.5mg  daily and Dr. Tenny Craw tried to increase it to 5mg  daily but it caused worsening fatigue and muscle weakness.  She says that she is very sensitive to medication and does not like to take meds.  She tells me that her whole family has stress driven HTN.  She also recently has noticed more fatigue with exertion and gets very fatigued and lethargic when she tries to do any physical exertion.  She denies any CP but has noticed increased DOE when walking up hills.  She did have one episode when she went out to eat with friends where she became diaphoretic, nauseated and vomited with some chest fullness like she had to burp but did not burp and then threw up.  She has not had any exertional chest tightness, heaviness or fullness otherwise. She denies any palpitations, dizziness or syncope and no LE edema.   Past Medical History:  Diagnosis Date  . Arthritis    hands, knees  . Deafness    left ear sue to shingles  . Hypertension   . Osteoporosis 2009   -2.6  . Shingles     Past Surgical History:  Procedure Laterality Date  . MASS EXCISION Right 05/30/2018   Procedure: EXCISION MASS RIGHT SMALL FINGER;  Surgeon: 06/01/2018, MD;  Location: [facility];  Service: Orthopedics;  Laterality: Right;  .  TONSILLECTOMY    . VAGINAL HYSTERECTOMY  1992    Current Medications: No outpatient medications have been marked as taking for the 01/22/20 encounter (Office Visit) with 01/24/20, MD.    Allergies:   Penicillin v, Penicillins, and Sulfa antibiotics   Social History   Socioeconomic History  . Marital status: Divorced    Spouse name: Not on file  . Number of children: Not on file  . Years of education: Not on file  . Highest education level: Not on file  Occupational History  . Not on file  Tobacco Use  . Smoking status: Never Smoker  . Smokeless tobacco: Never Used  Substance and Sexual Activity  . Alcohol use: Yes    Comment: ocaas  . Drug use: No  . Sexual activity: Yes    Birth control/protection: Surgical  Other Topics Concern  . Not on file  Social History Narrative  . Not on file   Social Determinants of Health   Financial Resource Strain:   . Difficulty of Paying Living Expenses:   Food Insecurity:   . Worried About Quintella Reichert in the Last Year:   . Programme researcher, broadcasting/film/video in the Last Year:   Transportation Needs:   . Barista (Medical):   Freight forwarder Lack of Transportation (Non-Medical):  Physical Activity:   . Days of Exercise per Week:   . Minutes of Exercise per Session:   Stress:   . Feeling of Stress :   Social Connections:   . Frequency of Communication with Friends and Family:   . Frequency of Social Gatherings with Friends and Family:   . Attends Religious Services:   . Active Member of Clubs or Organizations:   . Attends Banker Meetings:   Marland Kitchen Marital Status:      Family History:  The patient's family history includes Hypertension in her brother, mother, and sister; Stroke in her mother.   ROS:   Please see the history of present illness.    ROS All other systems reviewed and are negative.  No flowsheet data found.     PHYSICAL EXAM:   VS:  BP (!) 146/104   Pulse 100   Ht 5\' 1"  (1.549 m)   Wt 100 lb 3.2 oz  (45.5 kg)   LMP 10/02/1990   BMI 18.93 kg/m    GEN: Well nourished, well developed, in no acute distress  HEENT: normal  Neck: no JVD, carotid bruits, or masses Cardiac: RRR; no murmurs, rubs, or gallops,no edema.  Intact distal pulses bilaterally.  Respiratory:  clear to auscultation bilaterally, normal work of breathing GI: soft, nontender, nondistended, + BS MS: no deformity or atrophy  Skin: warm and dry, no rash Neuro:  Alert and Oriented x 3, Strength and sensation are intact Psych: euthymic mood, full affect  Wt Readings from Last 3 Encounters:  01/22/20 100 lb 3.2 oz (45.5 kg)  05/30/18 108 lb 6.4 oz (49.2 kg)  03/03/14 116 lb (52.6 kg)      Studies/Labs Reviewed:   EKG:  EKG is ordered today.  The ekg ordered today demonstrates NSR with no ST changes  Recent Labs: No results found for requested labs within last 8760 hours.   Lipid Panel    Component Value Date/Time   CHOL 180 03/03/2014 1650   TRIG 172 (H) 03/03/2014 1650   HDL 59 03/03/2014 1650   CHOLHDL 3.1 03/03/2014 1650   VLDL 34 03/03/2014 1650   LDLCALC 87 03/03/2014 1650    Additional studies/ records that were reviewed today include:  OV notes from PCP    ASSESSMENT:    1. Essential hypertension   2. Fatigue due to excessive exertion, initial encounter      PLAN:  In order of problems listed above:  1. HTN -she tells me this is anxiety and stress driven -she has multiple med intolerances including beta blockers, ARBs and cannot take any higher dose of Lisinopril than 2.5mg  daily -her BP is borderline elevated today and she is very leary of starting any new meds -she has agreed to start chlorthalidone 25mg  daily and will have a BMET in 1 week -I have recommended that she follow back with Dr. 05/03/2014 for further treatment of her HTN as I think part of this may be anxiety driven  2.  Exertional fatigue -unclear etiology -her CRFs include HTN a -EKG is nonischemia -recommend stress myoview  to assess for ischemia -she refused lexiscan myoview -check 2D echo to assess LVF      Medication Adjustments/Labs and Tests Ordered: Current medicines are reviewed at length with the patient today.  Concerns regarding medicines are outlined above.  Medication changes, Labs and Tests ordered today are listed in the Patient Instructions below.  Patient Instructions  Medication Instructions:  Your physician has recommended you make  the following change in your medication:  1) START taking chlorthalidone 25 mg daily   *If you need a refill on your cardiac medications before your next appointment, please call your pharmacy*  Lab Work: BMET in 1 week  If you have labs (blood work) drawn today and your tests are completely normal, you will receive your results only by: Marland Kitchen MyChart Message (if you have MyChart) OR . A paper copy in the mail If you have any lab test that is abnormal or we need to change your treatment, we will call you to review the results.   Testing/Procedures: Your physician has requested that you have an echocardiogram. Echocardiography is a painless test that uses sound waves to create images of your heart. It provides your doctor with information about the size and shape of your heart and how well your heart's chambers and valves are working. This procedure takes approximately one hour. There are no restrictions for this procedure.  Your physician has requested that you have a stress myoview.    Follow-Up: At [facility], you and your health needs are our priority.  As part of our continuing mission to provide you with exceptional heart care, we have created designated Provider Care Teams.  These Care Teams include your primary Cardiologist (physician) and Advanced Practice Providers (APPs -  Physician Assistants and Nurse Practitioners) who all work together to provide you with the care you need, when you need it.  We recommend signing up for the patient portal  called "MyChart".  Sign up information is provided on this After Visit Summary.  MyChart is used to connect with patients for Virtual Visits (Telemedicine).  Patients are able to view lab/test results, encounter notes, upcoming appointments, etc.  Non-urgent messages can be sent to your provider as well.   To learn more about what you can do with MyChart, go to [email or web address].    Follow up with Dr. Radford Pax as needed based on results from testing.   Follow up with your Primary Care Provider in two weeks to check on your blood pressure.       Signed, Fransico Him, MD  01/22/2020 2:02 PM    [facility] [place], [place], [place]  [address] Phone: [phone]; Fax: [phone]

## 2020-01-22 NOTE — Patient Instructions (Signed)
Medication Instructions:  Your physician has recommended you make the following change in your medication:  1) START taking chlorthalidone 25 mg daily   *If you need a refill on your cardiac medications before your next appointment, please call your pharmacy*  Lab Work: BMET in 1 week  If you have labs (blood work) drawn today and your tests are completely normal, you will receive your results only by: Marland Kitchen MyChart Message (if you have MyChart) OR . A paper copy in the mail If you have any lab test that is abnormal or we need to change your treatment, we will call you to review the results.   Testing/Procedures: Your physician has requested that you have an echocardiogram. Echocardiography is a painless test that uses sound waves to create images of your heart. It provides your doctor with information about the size and shape of your heart and how well your heart's chambers and valves are working. This procedure takes approximately one hour. There are no restrictions for this procedure.  Your physician has requested that you have a stress myoview.    Follow-Up: At [facility], you and your health needs are our priority.  As part of our continuing mission to provide you with exceptional heart care, we have created designated Provider Care Teams.  These Care Teams include your primary Cardiologist (physician) and Advanced Practice Providers (APPs -  Physician Assistants and Nurse Practitioners) who all work together to provide you with the care you need, when you need it.  We recommend signing up for the patient portal called "MyChart".  Sign up information is provided on this After Visit Summary.  MyChart is used to connect with patients for Virtual Visits (Telemedicine).  Patients are able to view lab/test results, encounter notes, upcoming appointments, etc.  Non-urgent messages can be sent to your provider as well.   To learn more about what you can do with MyChart, go to  [email or web address].    Follow up with Dr. Mayford Knife as needed based on results from testing.   Follow up with your Primary Care Provider in two weeks to check on your blood pressure.

## 2020-01-23 DIAGNOSIS — R351 Nocturia: Secondary | ICD-10-CM | POA: Diagnosis not present

## 2020-01-23 DIAGNOSIS — R350 Frequency of micturition: Secondary | ICD-10-CM | POA: Diagnosis not present

## 2020-01-23 DIAGNOSIS — R3915 Urgency of urination: Secondary | ICD-10-CM | POA: Diagnosis not present

## 2020-01-26 DIAGNOSIS — H0102B Squamous blepharitis left eye, upper and lower eyelids: Secondary | ICD-10-CM | POA: Diagnosis not present

## 2020-01-26 DIAGNOSIS — H02831 Dermatochalasis of right upper eyelid: Secondary | ICD-10-CM | POA: Diagnosis not present

## 2020-01-26 DIAGNOSIS — H02834 Dermatochalasis of left upper eyelid: Secondary | ICD-10-CM | POA: Diagnosis not present

## 2020-01-26 DIAGNOSIS — H5702 Anisocoria: Secondary | ICD-10-CM | POA: Diagnosis not present

## 2020-01-27 ENCOUNTER — Other Ambulatory Visit: Payer: Self-pay

## 2020-01-27 ENCOUNTER — Ambulatory Visit (HOSPITAL_COMMUNITY): Payer: Medicare Other | Attending: Internal Medicine

## 2020-01-27 DIAGNOSIS — I10 Essential (primary) hypertension: Secondary | ICD-10-CM | POA: Diagnosis not present

## 2020-01-27 DIAGNOSIS — X58XXXA Exposure to other specified factors, initial encounter: Secondary | ICD-10-CM | POA: Insufficient documentation

## 2020-01-27 DIAGNOSIS — T733XXA Exhaustion due to excessive exertion, initial encounter: Secondary | ICD-10-CM | POA: Diagnosis not present

## 2020-01-27 DIAGNOSIS — I119 Hypertensive heart disease without heart failure: Secondary | ICD-10-CM | POA: Insufficient documentation

## 2020-01-28 DIAGNOSIS — R519 Headache, unspecified: Secondary | ICD-10-CM | POA: Diagnosis not present

## 2020-01-28 DIAGNOSIS — G479 Sleep disorder, unspecified: Secondary | ICD-10-CM | POA: Diagnosis not present

## 2020-01-28 DIAGNOSIS — F419 Anxiety disorder, unspecified: Secondary | ICD-10-CM | POA: Diagnosis not present

## 2020-02-02 ENCOUNTER — Other Ambulatory Visit: Payer: Medicare Other | Admitting: *Deleted

## 2020-02-02 ENCOUNTER — Other Ambulatory Visit: Payer: Self-pay

## 2020-02-02 ENCOUNTER — Other Ambulatory Visit (HOSPITAL_COMMUNITY): Payer: Medicare Other

## 2020-02-02 ENCOUNTER — Other Ambulatory Visit (HOSPITAL_COMMUNITY)
Admission: RE | Admit: 2020-02-02 | Discharge: 2020-02-02 | Disposition: A | Discharge status: Home / Self Care | Payer: Medicare Other | Source: Ambulatory Visit | Attending: Cardiology | Admitting: Cardiology

## 2020-02-02 DIAGNOSIS — Z01812 Encounter for preprocedural laboratory examination: Secondary | ICD-10-CM | POA: Insufficient documentation

## 2020-02-02 DIAGNOSIS — T733XXA Exhaustion due to excessive exertion, initial encounter: Secondary | ICD-10-CM

## 2020-02-02 DIAGNOSIS — Z20822 Contact with and (suspected) exposure to covid-19: Secondary | ICD-10-CM | POA: Insufficient documentation

## 2020-02-02 LAB — BASIC METABOLIC PANEL
BUN/Creatinine Ratio: 25 (ref 12–28)
BUN: 20 mg/dL (ref 8–27)
CO2: 24 mmol/L (ref 20–29)
Calcium: 10.2 mg/dL (ref 8.7–10.3)
Chloride: 100 mmol/L (ref 96–106)
Creatinine, Ser: 0.79 mg/dL (ref 0.57–1.00)
GFR calc Af Amer: 88 mL/min/{1.73_m2} (ref >59)
GFR calc non Af Amer: 77 mL/min/{1.73_m2} (ref >59)
Glucose: 100 mg/dL — ABNORMAL HIGH (ref 65–99)
Potassium: 3.9 mmol/L (ref 3.5–5.2)
Sodium: 139 mmol/L (ref 134–144)

## 2020-02-02 LAB — SARS CORONAVIRUS 2 (TAT 6-24 HRS): SARS Coronavirus 2: NEGATIVE

## 2020-02-03 ENCOUNTER — Telehealth: Payer: Self-pay | Admitting: Cardiology

## 2020-02-03 ENCOUNTER — Telehealth (HOSPITAL_COMMUNITY): Payer: Self-pay

## 2020-02-03 NOTE — Telephone Encounter (Signed)
Detailed instructions left on the patient's answering machine per DPR. Asked to call back with any questions. S.Stefana Lodico EMTP 

## 2020-02-03 NOTE — Telephone Encounter (Signed)
Patient calling  Patient requesting to speak with Kinnie Feil about upcoming test I also spoke with [place] and she will be contacting patient again to go over instructions  Please call to discuss

## 2020-02-04 ENCOUNTER — Telehealth (HOSPITAL_COMMUNITY): Payer: Self-pay | Admitting: Radiology

## 2020-02-04 NOTE — Telephone Encounter (Signed)
Spoke with the patient in regards to upcoming stress test. Advised her of negative COVID results. She is concerned about having the test done because her sciatica is acting up. She is also concerned about wearing a mask. She states that she has some further questions about what happens during the test. She had a message from [place] and is requesting that [place] call her back to go over test in more detail. I advised her that I would send her a message to call her back. Advised patient that if she decides to reschedule the test then she should call our office.

## 2020-02-04 NOTE — Telephone Encounter (Signed)
Patient given detailed instructions per Myocardial Perfusion Study Information Sheet for the test on 02/05/2020 at 1045. Patient notified to arrive 15 minutes early and that it is imperative to arrive on time for appointment to keep from having the test rescheduled.  If you need to cancel or reschedule your appointment, please call the office within 24 hours of your appointment. . Patient verbalized understanding. TMY

## 2020-02-05 ENCOUNTER — Ambulatory Visit (HOSPITAL_COMMUNITY): Payer: Medicare Other | Attending: Cardiovascular Disease

## 2020-02-05 ENCOUNTER — Other Ambulatory Visit: Payer: Self-pay

## 2020-02-05 DIAGNOSIS — T733XXA Exhaustion due to excessive exertion, initial encounter: Secondary | ICD-10-CM | POA: Diagnosis not present

## 2020-02-05 DIAGNOSIS — X58XXXA Exposure to other specified factors, initial encounter: Secondary | ICD-10-CM | POA: Diagnosis not present

## 2020-02-05 DIAGNOSIS — I10 Essential (primary) hypertension: Secondary | ICD-10-CM | POA: Diagnosis not present

## 2020-02-05 DIAGNOSIS — R0600 Dyspnea, unspecified: Secondary | ICD-10-CM | POA: Diagnosis not present

## 2020-02-05 DIAGNOSIS — R42 Dizziness and giddiness: Secondary | ICD-10-CM | POA: Diagnosis not present

## 2020-02-05 LAB — MYOCARDIAL PERFUSION IMAGING
Estimated workload: 4.6 METS
Exercise duration (min): 4 min
Exercise duration (sec): 19 s
LV dias vol: 32 mL (ref 46–106)
LV sys vol: 7 mL
MPHR: 151 {beats}/min
Peak HR: 157 {beats}/min
Percent HR: 103 %
Rest HR: 96 {beats}/min
SDS: 3
SRS: 1
SSS: 4
TID: 0.91

## 2020-02-05 MED ORDER — TECHNETIUM TC 99M TETROFOSMIN IV KIT
32.0000 | PACK | Freq: Once | INTRAVENOUS | Status: AC | PRN
  Administered 2020-02-05: 32 via INTRAVENOUS
  Filled 2020-02-05: qty 32

## 2020-02-05 MED ORDER — TECHNETIUM TC 99M TETROFOSMIN IV KIT
10.5000 | PACK | Freq: Once | INTRAVENOUS | Status: AC | PRN
  Administered 2020-02-05: 10.5 via INTRAVENOUS
  Filled 2020-02-05: qty 11

## 2020-03-03 DIAGNOSIS — G2581 Restless legs syndrome: Secondary | ICD-10-CM | POA: Diagnosis not present

## 2020-03-03 DIAGNOSIS — F419 Anxiety disorder, unspecified: Secondary | ICD-10-CM | POA: Diagnosis not present

## 2020-03-03 DIAGNOSIS — I10 Essential (primary) hypertension: Secondary | ICD-10-CM | POA: Diagnosis not present

## 2020-03-03 DIAGNOSIS — M5430 Sciatica, unspecified side: Secondary | ICD-10-CM | POA: Diagnosis not present

## 2020-03-08 DIAGNOSIS — M5430 Sciatica, unspecified side: Secondary | ICD-10-CM | POA: Diagnosis not present

## 2020-03-11 DIAGNOSIS — J019 Acute sinusitis, unspecified: Secondary | ICD-10-CM | POA: Diagnosis not present

## 2020-03-22 ENCOUNTER — Encounter: Payer: Self-pay | Admitting: Neurology

## 2020-03-22 DIAGNOSIS — R269 Unspecified abnormalities of gait and mobility: Secondary | ICD-10-CM | POA: Diagnosis not present

## 2020-03-22 DIAGNOSIS — M5430 Sciatica, unspecified side: Secondary | ICD-10-CM | POA: Diagnosis not present

## 2020-03-22 DIAGNOSIS — R634 Abnormal weight loss: Secondary | ICD-10-CM | POA: Diagnosis not present

## 2020-03-24 DIAGNOSIS — L300 Nummular dermatitis: Secondary | ICD-10-CM | POA: Diagnosis not present

## 2020-04-07 NOTE — Progress Notes (Addendum)
NEUROLOGY CONSULTATION NOTE  MORNING HALBERG MRN: [id number] DOB: 10/03/1950  Referring provider: C. Duane Lope, MD Primary care provider: C. Duane Lope, MD  Reason for consult:  Unsteady gait  HISTORY OF PRESENT ILLNESS: Hannah Fisher. Widrig is a 69 year old right-handed female with HTN, osteoporosis, arthritis in hands and knees, and left ear deafness secondary to shingles who presents for unsteady gait.  History supplemented by referring provider's note.  She developed low back pain in June 2020.  No preceding trauma.  Back pain resolved but then she developed pain in the hips and buttocks running down the posterior legs bilaterally, left worse than right.  The pain is described as an aching and pins and needles sensation.  She will not numbness and cramping in the toes of her left foot.  Occasionally, shooting pain down the legs.  No acute change in bowel or bladder dysfunction although she has increased urinary frequency.  She also developed restless leg symptoms, which kept her up at night.  She received cortisone injections which were helpful.  Symptoms returned in February, following her second COVID vaccine.  Her legs feel heavier and reports more difficulty ambulating.  She feels unsteady, like she is going to fall backwards.  She has not had any falls. She stopped her daily walks several weeks ago due to difficulty with balance.  She started experiencing restless leg symptoms and is currently taking ropinirole 0.125mg  to 0.25mg  at bedtime, which helps.  She is starting physical therapy next week.  She reports unintentional 10 lb weight loss over the past year, despite no change in appetite.  She has upcoming appointment with gastroenterology.  Denies dysphagia.  Denies double vision.  Labs from April include sed rate 1, CRP negative.  Thyroid panel and Hgb A1c checked in June were reportedly unremarkable.  PAST MEDICAL HISTORY: Past Medical History:  Diagnosis Date  . Arthritis    hands,  knees  . Deafness    left ear sue to shingles  . Hypertension   . Kidney stones   . Osteoporosis 2009   -2.6  . Shingles     PAST SURGICAL HISTORY: Past Surgical History:  Procedure Laterality Date  . MASS EXCISION Right 05/30/2018   Procedure: EXCISION MASS RIGHT SMALL FINGER;  Surgeon: Cindee Salt, MD;  Location: [facility];  Service: Orthopedics;  Laterality: Right;  . TONSILLECTOMY    . VAGINAL HYSTERECTOMY  1992    MEDICATIONS: Current Outpatient Medications on File Prior to Visit  Medication Sig Dispense Refill  . aspirin 81 MG tablet Take 81 mg by mouth daily.      . chlorthalidone (HYGROTON) 25 MG tablet Take 1 tablet (25 mg total) by mouth daily. 90 tablet 3  . cholecalciferol (VITAMIN D) 1000 units tablet Take 1,000 Units by mouth daily.    Marland Kitchen lisinopril (PRINIVIL,ZESTRIL) 2.5 MG tablet Take 2.5 mg by mouth daily.      . magnesium oxide (MAG-OX) 400 MG tablet Take 400 mg by mouth daily.    . Multiple Vitamin (MULTIVITAMIN) tablet Take 1 tablet by mouth daily.       No current facility-administered medications on file prior to visit.    ALLERGIES: Allergies  Allergen Reactions  . Penicillin V Swelling  . Penicillins Hives  . Sulfa Antibiotics Rash    FAMILY HISTORY: Family History  Problem Relation Age of Onset  . Hypertension Mother   . Stroke Mother   . Hypertension Sister   . Hypertension Brother   .  Breast cancer Neg Hx     SOCIAL HISTORY: Social History   Socioeconomic History  . Marital status: Divorced    Spouse name: Not on file  . Number of children: Not on file  . Years of education: Not on file  . Highest education level: Not on file  Occupational History  . Not on file  Tobacco Use  . Smoking status: Never Smoker  . Smokeless tobacco: Never Used  Vaping Use  . Vaping Use: Never used  Substance and Sexual Activity  . Alcohol use: Yes    Comment: ocaas  . Drug use: No  . Sexual activity: Yes    Birth  control/protection: Surgical  Other Topics Concern  . Not on file  Social History Narrative  . Not on file   Social Determinants of Health   Financial Resource Strain:   . Difficulty of Paying Living Expenses:   Food Insecurity:   . Worried About Programme researcher, broadcasting/film/video in the Last Year:   . Barista in the Last Year:   Transportation Needs:   . Freight forwarder (Medical):   Marland Kitchen Lack of Transportation (Non-Medical):   Physical Activity:   . Days of Exercise per Week:   . Minutes of Exercise per Session:   Stress:   . Feeling of Stress :   Social Connections:   . Frequency of Communication with Friends and Family:   . Frequency of Social Gatherings with Friends and Family:   . Attends Religious Services:   . Active Member of Clubs or Organizations:   . Attends Banker Meetings:   Marland Kitchen Marital Status:   Intimate Partner Violence:   . Fear of Current or Ex-Partner:   . Emotionally Abused:   Marland Kitchen Physically Abused:   . Sexually Abused:     PHYSICAL EXAM: Blood pressure (!) 141/94, pulse (!) 107, height 5' (1.524 m), weight 96 lb (43.5 kg), last menstrual period 10/02/1990, SpO2 99 %. General: No acute distress.  Patient appears well-groomed.  Head:  Normocephalic/atraumatic Eyes:  fundi examined but not visualized Neck: supple, no paraspinal tenderness, full range of motion Back: No paraspinal tenderness Heart: regular rate and rhythm Lungs: Clear to auscultation bilaterally. Vascular: No carotid bruits. Neurological Exam: Mental status: alert and oriented to person, place, and time, recent and remote memory intact, fund of knowledge intact, attention and concentration intact, speech fluent and not dysarthric, language intact. Cranial nerves: CN I: not tested CN II: pupils equal, round and reactive to light, visual fields intact CN III, IV, VI:  full range of motion, no nystagmus, no ptosis CN V: facial sensation intact CN VII: upper and lower face  symmetric CN VIII: hearing intact CN IX, X: gag intact, uvula midline CN XI: sternocleidomastoid and trapezius muscles intact CN XII: tongue midline Bulk & Tone: normal, no rigidity or fasciculations. Motor:  5/5 throughout.  No tremor or bradykinesia Sensation:  Pinprick sensation slightly reduced in left foot and leg.  Vibratory sensation intact. Deep Tendon Reflexes:  2+ throughout, toes downgoing.   Finger to nose testing:  Without dysmetria.   Heel to shin:  Without dysmetria.   Gait:  Wide-based gait.  4 to 5 step turn.  Unable to tandem walk.  Romberg negative.  IMPRESSION: 1.  Unsteady gait 2.  Bilateral leg pain and numbness 3.  Restless leg syndrome  I would first focus on possible lumbosacral stenosis.  Exam does not reveal findings consistent with neurodegenerative disorder such as  Parkinson's disease, stroke, or myelopathy (such as due to cervical spinal stenosis).  PLAN: 1.  Check B12, SPEP/IFE and ferritin level 2.  Will check MRI of lumbar spine.  She has failed conservative management such as cortisone injections. 3.  Physical therapy 4.  Ropinirole 0.125mg  to 0.25mg  at bedtime 5.  Follow up after testing.  Thank you for allowing me to take part in the care of this patient.  Shon Millet, DO  CC: C. Duane Lope, MD  08/09/2020 ADDENDUM:  Patient has upcoming MRI of brain.  She endorses bilateral arm weakness.  MRI of cervical spine denied but will appeal, however have to wait 180 days.

## 2020-04-08 ENCOUNTER — Encounter: Payer: Self-pay | Admitting: Neurology

## 2020-04-08 ENCOUNTER — Other Ambulatory Visit: Payer: Self-pay

## 2020-04-08 ENCOUNTER — Telehealth: Payer: Self-pay

## 2020-04-08 ENCOUNTER — Other Ambulatory Visit (INDEPENDENT_AMBULATORY_CARE_PROVIDER_SITE_OTHER): Payer: Medicare Other

## 2020-04-08 ENCOUNTER — Ambulatory Visit (INDEPENDENT_AMBULATORY_CARE_PROVIDER_SITE_OTHER): Payer: Medicare Other | Admitting: Neurology

## 2020-04-08 VITALS — BP 141/94 | HR 107 | Ht 60.0 in | Wt 96.0 lb

## 2020-04-08 DIAGNOSIS — M5442 Lumbago with sciatica, left side: Secondary | ICD-10-CM

## 2020-04-08 DIAGNOSIS — M5441 Lumbago with sciatica, right side: Secondary | ICD-10-CM

## 2020-04-08 DIAGNOSIS — R202 Paresthesia of skin: Secondary | ICD-10-CM

## 2020-04-08 DIAGNOSIS — R2681 Unsteadiness on feet: Secondary | ICD-10-CM

## 2020-04-08 DIAGNOSIS — R200 Anesthesia of skin: Secondary | ICD-10-CM

## 2020-04-08 NOTE — Patient Instructions (Addendum)
1.  Will check B12, SPEP/IFE, ferritin. Your provider has requested that you have labwork completed today. Please go to [facility] ([id number]) on the second floor of this building before leaving the office today. You do not need to check in. If you are not called within 15 minutes please check with the front desk.  2.  Will check MRI of lumbar spine without contrast. We have sent a referral to [facility] for your MRI and they will call you directly to schedule your appointment. They are located at [address] [facility]. If you need to contact them directly please call [phone].  3.  Follow up with physical therapy 4.  Continue ropinirole at bedtime 5.  Follow up after testing.

## 2020-04-08 NOTE — Telephone Encounter (Signed)
After pt visit, Pt wanted to ask a couple of questions.  1. Pt wanted to know if was a good Idea to stop the baby Asprin? 2. Will the MRI of the Lumbar spine show her lower back? I advised pt yes, but she still wanted me to ask.

## 2020-04-08 NOTE — Telephone Encounter (Signed)
Regarding aspirin, I defer to her PCP as we did not discuss that.  Yes, the lumbar spine MRI visualizes the lower back

## 2020-04-09 LAB — VITAMIN B12: Vitamin B-12: 221 pg/mL (ref 211–911)

## 2020-04-09 LAB — FERRITIN: Ferritin: 29.8 ng/mL (ref 10.0–291.0)

## 2020-04-09 NOTE — Telephone Encounter (Signed)
LMOVM for pt to call me back  

## 2020-04-09 NOTE — Telephone Encounter (Signed)
Patient called returning your call

## 2020-04-09 NOTE — Telephone Encounter (Signed)
LMOVM

## 2020-04-09 NOTE — Telephone Encounter (Signed)
Patient called in stating she has a few more questions from speaking with Sheena yesterday.

## 2020-04-09 NOTE — Telephone Encounter (Signed)
Spoke to pt, Pt state, Dr. Everlena Cooper wanted to know what type of steroid given for hip.  Per pt she get Depo Medrol injections.   Pt advised her MRI was approved for her 05/10/20 visit.

## 2020-04-12 ENCOUNTER — Telehealth: Payer: Self-pay | Admitting: Neurology

## 2020-04-12 LAB — PROTEIN ELECTROPHORESIS, SERUM
Albumin ELP: 4.2 g/dL (ref 3.8–4.8)
Alpha 1: 0.3 g/dL (ref 0.2–0.3)
Alpha 2: 0.7 g/dL (ref 0.5–0.9)
Beta 2: 0.3 g/dL (ref 0.2–0.5)
Beta Globulin: 0.4 g/dL (ref 0.4–0.6)
Gamma Globulin: 0.7 g/dL — ABNORMAL LOW (ref 0.8–1.7)
Total Protein: 6.5 g/dL (ref 6.1–8.1)

## 2020-04-12 NOTE — Telephone Encounter (Signed)
Advised pt once labs are reviewed we will call her and let her know the results and then I can mail them.

## 2020-04-12 NOTE — Telephone Encounter (Signed)
I was waiting for all of the labs to return as they are not yet all available.  The only thing notable is that her B12 is in the low normal range, which may sometimes still cause B12 deficiency symptoms.  She may consider taking OTC B12 daily

## 2020-04-12 NOTE — Telephone Encounter (Signed)
AccessNurse 04/12/20 @ 12:17PM:  "Caller states she needs to leave a message for the nurse. She is calling for lab results. She wants to have a copy mailed to her."

## 2020-04-13 LAB — IMMUNOFIXATION, SERUM
IgA/Immunoglobulin A, Serum: 103 mg/dL (ref 87–352)
IgG (Immunoglobin G), Serum: 738 mg/dL (ref 586–1602)
IgM (Immunoglobulin M), Srm: 46 mg/dL (ref 26–217)

## 2020-04-13 NOTE — Telephone Encounter (Signed)
Pt advised of note. Once all labs come in we will mail them to her. And to start b12 OTC.

## 2020-04-14 ENCOUNTER — Telehealth: Payer: Self-pay

## 2020-04-14 NOTE — Telephone Encounter (Signed)
-----   Message from Drema Dallas, DO sent at 04/13/2020  4:28 PM EDT ----- All of the neuropathy labs have returned.  The only finding that may contribute to neuropathy is that the B12 level is in the low normal range, which may cause numbness and tingling.  Recommend taking B12 daily

## 2020-04-14 NOTE — Telephone Encounter (Signed)
Pt advised of lab results.  Pt advised labs mailed to address on file.

## 2020-04-20 ENCOUNTER — Telehealth: Payer: Self-pay | Admitting: Neurology

## 2020-04-20 NOTE — Telephone Encounter (Signed)
Patient has an MRI scheduled. In the mean time, she is looking for recommendations from Dr. Everlena Cooper for anything to help the unsteady gait and balance problems she is experiencing. Please call.

## 2020-04-21 ENCOUNTER — Other Ambulatory Visit: Payer: Self-pay

## 2020-04-21 ENCOUNTER — Telehealth: Payer: Self-pay | Admitting: Neurology

## 2020-04-21 DIAGNOSIS — R2681 Unsteadiness on feet: Secondary | ICD-10-CM

## 2020-04-21 NOTE — Telephone Encounter (Signed)
Physical therapy. 

## 2020-04-21 NOTE — Telephone Encounter (Signed)
Patient called in and left a message needing the information for the physical therapy place she was told about previously.

## 2020-04-21 NOTE — Telephone Encounter (Signed)
Pt advised and order added

## 2020-04-22 NOTE — Telephone Encounter (Signed)
Left VM requesting call back

## 2020-04-23 DIAGNOSIS — R350 Frequency of micturition: Secondary | ICD-10-CM | POA: Diagnosis not present

## 2020-04-23 DIAGNOSIS — R3915 Urgency of urination: Secondary | ICD-10-CM | POA: Diagnosis not present

## 2020-04-23 DIAGNOSIS — N952 Postmenopausal atrophic vaginitis: Secondary | ICD-10-CM | POA: Diagnosis not present

## 2020-04-27 DIAGNOSIS — R2689 Other abnormalities of gait and mobility: Secondary | ICD-10-CM | POA: Diagnosis not present

## 2020-04-27 DIAGNOSIS — M5416 Radiculopathy, lumbar region: Secondary | ICD-10-CM | POA: Diagnosis not present

## 2020-04-28 DIAGNOSIS — M5416 Radiculopathy, lumbar region: Secondary | ICD-10-CM | POA: Insufficient documentation

## 2020-05-03 DIAGNOSIS — R2689 Other abnormalities of gait and mobility: Secondary | ICD-10-CM | POA: Diagnosis not present

## 2020-05-03 DIAGNOSIS — M5431 Sciatica, right side: Secondary | ICD-10-CM | POA: Diagnosis not present

## 2020-05-03 DIAGNOSIS — H9192 Unspecified hearing loss, left ear: Secondary | ICD-10-CM | POA: Diagnosis not present

## 2020-05-03 DIAGNOSIS — M5432 Sciatica, left side: Secondary | ICD-10-CM | POA: Diagnosis not present

## 2020-05-04 ENCOUNTER — Other Ambulatory Visit: Payer: Self-pay | Admitting: Gastroenterology

## 2020-05-04 DIAGNOSIS — R634 Abnormal weight loss: Secondary | ICD-10-CM

## 2020-05-04 DIAGNOSIS — K5900 Constipation, unspecified: Secondary | ICD-10-CM

## 2020-05-06 ENCOUNTER — Ambulatory Visit: Payer: Medicare Other | Admitting: Physical Therapy

## 2020-05-10 ENCOUNTER — Other Ambulatory Visit: Payer: Self-pay

## 2020-05-10 ENCOUNTER — Ambulatory Visit
Admission: RE | Admit: 2020-05-10 | Discharge: 2020-05-10 | Disposition: A | Discharge status: Home / Self Care | Payer: Medicare Other | Source: Ambulatory Visit | Attending: Neurology | Admitting: Neurology

## 2020-05-10 DIAGNOSIS — R2681 Unsteadiness on feet: Secondary | ICD-10-CM

## 2020-05-10 DIAGNOSIS — M48061 Spinal stenosis, lumbar region without neurogenic claudication: Secondary | ICD-10-CM | POA: Diagnosis not present

## 2020-05-10 DIAGNOSIS — M5442 Lumbago with sciatica, left side: Secondary | ICD-10-CM

## 2020-05-10 DIAGNOSIS — R200 Anesthesia of skin: Secondary | ICD-10-CM

## 2020-05-10 DIAGNOSIS — M5441 Lumbago with sciatica, right side: Secondary | ICD-10-CM

## 2020-05-11 ENCOUNTER — Telehealth: Payer: Self-pay

## 2020-05-11 DIAGNOSIS — R200 Anesthesia of skin: Secondary | ICD-10-CM

## 2020-05-11 NOTE — Telephone Encounter (Signed)
LMOVM to call us back in regards to her MRI

## 2020-05-11 NOTE — Telephone Encounter (Signed)
Pt advised of her MRI results. Pt wanted to know if she could get an appt with dr.Jaffe in the next couple of days to go over the next steps. And she do not want to wait this is a issue she has delt with for to long.    Advised pt that we will let Dr.Jaffe know you want an appt and that his appt are booked out til dec. 2021. She can be add to the scheduled and put on the wait list. Or if Dr. Everlena Cooper lets me know what the next steps are I can give you a call.    Per Pt she do not want to be put on a wait list or given a call by me or anyone else she want to see DR. Jaffe When he comes back or in the next four days.

## 2020-05-11 NOTE — Telephone Encounter (Signed)
-----   Message from Octaviano Batty Tat, DO sent at 05/11/2020  7:22 AM EDT ----- Let pt know that MRI lumbar spine is unremarkable

## 2020-05-12 ENCOUNTER — Other Ambulatory Visit: Payer: Self-pay | Admitting: Gastroenterology

## 2020-05-12 DIAGNOSIS — M5416 Radiculopathy, lumbar region: Secondary | ICD-10-CM | POA: Diagnosis not present

## 2020-05-13 ENCOUNTER — Ambulatory Visit
Admission: RE | Admit: 2020-05-13 | Discharge: 2020-05-13 | Disposition: A | Discharge status: Home / Self Care | Payer: Medicare Other | Source: Ambulatory Visit | Attending: Gastroenterology | Admitting: Gastroenterology

## 2020-05-13 DIAGNOSIS — R634 Abnormal weight loss: Secondary | ICD-10-CM

## 2020-05-13 DIAGNOSIS — K5900 Constipation, unspecified: Secondary | ICD-10-CM

## 2020-05-13 NOTE — Telephone Encounter (Signed)
I would like to order nerve conduction study of lower extremities.  She needs to make a follow up afterwards.

## 2020-05-13 NOTE — Telephone Encounter (Signed)
LMOVM, Pt to schedule for EMG of BLLE and f/u after testing. Order added.

## 2020-05-14 DIAGNOSIS — R0781 Pleurodynia: Secondary | ICD-10-CM | POA: Diagnosis not present

## 2020-05-14 DIAGNOSIS — I10 Essential (primary) hypertension: Secondary | ICD-10-CM | POA: Diagnosis not present

## 2020-05-14 DIAGNOSIS — M5430 Sciatica, unspecified side: Secondary | ICD-10-CM | POA: Diagnosis not present

## 2020-05-14 DIAGNOSIS — R9389 Abnormal findings on diagnostic imaging of other specified body structures: Secondary | ICD-10-CM | POA: Diagnosis not present

## 2020-05-17 DIAGNOSIS — M5416 Radiculopathy, lumbar region: Secondary | ICD-10-CM | POA: Diagnosis not present

## 2020-05-18 ENCOUNTER — Other Ambulatory Visit: Payer: Self-pay | Admitting: Family Medicine

## 2020-05-18 DIAGNOSIS — R9389 Abnormal findings on diagnostic imaging of other specified body structures: Secondary | ICD-10-CM

## 2020-05-19 ENCOUNTER — Other Ambulatory Visit: Payer: Self-pay | Admitting: Family Medicine

## 2020-05-19 DIAGNOSIS — R2689 Other abnormalities of gait and mobility: Secondary | ICD-10-CM | POA: Diagnosis not present

## 2020-05-19 DIAGNOSIS — R634 Abnormal weight loss: Secondary | ICD-10-CM

## 2020-05-19 DIAGNOSIS — M5416 Radiculopathy, lumbar region: Secondary | ICD-10-CM | POA: Diagnosis not present

## 2020-05-19 DIAGNOSIS — R9389 Abnormal findings on diagnostic imaging of other specified body structures: Secondary | ICD-10-CM

## 2020-05-24 DIAGNOSIS — R2689 Other abnormalities of gait and mobility: Secondary | ICD-10-CM | POA: Diagnosis not present

## 2020-05-26 DIAGNOSIS — M5416 Radiculopathy, lumbar region: Secondary | ICD-10-CM | POA: Diagnosis not present

## 2020-05-28 DIAGNOSIS — M5416 Radiculopathy, lumbar region: Secondary | ICD-10-CM | POA: Diagnosis not present

## 2020-05-31 DIAGNOSIS — R2689 Other abnormalities of gait and mobility: Secondary | ICD-10-CM | POA: Diagnosis not present

## 2020-06-02 DIAGNOSIS — M5416 Radiculopathy, lumbar region: Secondary | ICD-10-CM | POA: Diagnosis not present

## 2020-06-03 ENCOUNTER — Other Ambulatory Visit: Payer: Medicare Other

## 2020-06-04 DIAGNOSIS — R2689 Other abnormalities of gait and mobility: Secondary | ICD-10-CM | POA: Diagnosis not present

## 2020-06-11 DIAGNOSIS — G2581 Restless legs syndrome: Secondary | ICD-10-CM | POA: Diagnosis not present

## 2020-06-11 DIAGNOSIS — R2689 Other abnormalities of gait and mobility: Secondary | ICD-10-CM | POA: Diagnosis not present

## 2020-06-11 DIAGNOSIS — Z0000 Encounter for general adult medical examination without abnormal findings: Secondary | ICD-10-CM | POA: Diagnosis not present

## 2020-06-11 DIAGNOSIS — M810 Age-related osteoporosis without current pathological fracture: Secondary | ICD-10-CM | POA: Diagnosis not present

## 2020-06-11 DIAGNOSIS — I10 Essential (primary) hypertension: Secondary | ICD-10-CM | POA: Diagnosis not present

## 2020-06-14 DIAGNOSIS — M5416 Radiculopathy, lumbar region: Secondary | ICD-10-CM | POA: Diagnosis not present

## 2020-06-14 DIAGNOSIS — R2689 Other abnormalities of gait and mobility: Secondary | ICD-10-CM | POA: Diagnosis not present

## 2020-06-16 ENCOUNTER — Telehealth: Payer: Self-pay | Admitting: Neurology

## 2020-06-16 DIAGNOSIS — R2689 Other abnormalities of gait and mobility: Secondary | ICD-10-CM | POA: Diagnosis not present

## 2020-06-16 NOTE — Telephone Encounter (Signed)
Please advise and route to pool thanks/

## 2020-06-16 NOTE — Telephone Encounter (Signed)
Patient was advise that the next step is moving forward with the EMG. At this time she does not want to move forward with the EMG and would like to have Dr Everlena Cooper think of anything she could try other than the EMG

## 2020-06-17 NOTE — Telephone Encounter (Signed)
Left message of the above  

## 2020-06-17 NOTE — Telephone Encounter (Signed)
To me, the EMG is the next logical step.

## 2020-06-18 DIAGNOSIS — M5416 Radiculopathy, lumbar region: Secondary | ICD-10-CM | POA: Diagnosis not present

## 2020-06-18 DIAGNOSIS — R2689 Other abnormalities of gait and mobility: Secondary | ICD-10-CM | POA: Diagnosis not present

## 2020-06-21 DIAGNOSIS — R2689 Other abnormalities of gait and mobility: Secondary | ICD-10-CM | POA: Diagnosis not present

## 2020-06-22 DIAGNOSIS — H9192 Unspecified hearing loss, left ear: Secondary | ICD-10-CM | POA: Diagnosis not present

## 2020-06-22 DIAGNOSIS — R42 Dizziness and giddiness: Secondary | ICD-10-CM | POA: Insufficient documentation

## 2020-06-22 DIAGNOSIS — H903 Sensorineural hearing loss, bilateral: Secondary | ICD-10-CM | POA: Diagnosis not present

## 2020-06-23 DIAGNOSIS — H0102B Squamous blepharitis left eye, upper and lower eyelids: Secondary | ICD-10-CM | POA: Diagnosis not present

## 2020-06-23 DIAGNOSIS — H5712 Ocular pain, left eye: Secondary | ICD-10-CM | POA: Diagnosis not present

## 2020-06-23 DIAGNOSIS — H2513 Age-related nuclear cataract, bilateral: Secondary | ICD-10-CM | POA: Diagnosis not present

## 2020-06-23 DIAGNOSIS — H0102A Squamous blepharitis right eye, upper and lower eyelids: Secondary | ICD-10-CM | POA: Diagnosis not present

## 2020-06-25 DIAGNOSIS — R2689 Other abnormalities of gait and mobility: Secondary | ICD-10-CM | POA: Diagnosis not present

## 2020-06-28 ENCOUNTER — Ambulatory Visit
Admission: RE | Admit: 2020-06-28 | Discharge: 2020-06-28 | Disposition: A | Discharge status: Home / Self Care | Payer: Medicare Other | Source: Ambulatory Visit | Attending: Family Medicine | Admitting: Family Medicine

## 2020-06-28 ENCOUNTER — Other Ambulatory Visit: Payer: Self-pay

## 2020-06-28 DIAGNOSIS — N6311 Unspecified lump in the right breast, upper outer quadrant: Secondary | ICD-10-CM | POA: Diagnosis not present

## 2020-06-28 DIAGNOSIS — N6310 Unspecified lump in the right breast, unspecified quadrant: Secondary | ICD-10-CM

## 2020-06-28 DIAGNOSIS — R928 Other abnormal and inconclusive findings on diagnostic imaging of breast: Secondary | ICD-10-CM | POA: Diagnosis not present

## 2020-06-30 DIAGNOSIS — R2689 Other abnormalities of gait and mobility: Secondary | ICD-10-CM | POA: Diagnosis not present

## 2020-07-05 DIAGNOSIS — R2689 Other abnormalities of gait and mobility: Secondary | ICD-10-CM | POA: Diagnosis not present

## 2020-07-08 DIAGNOSIS — M5136 Other intervertebral disc degeneration, lumbar region: Secondary | ICD-10-CM | POA: Diagnosis not present

## 2020-07-09 NOTE — Telephone Encounter (Signed)
Pt advised we will call her once someone cancels.

## 2020-07-09 NOTE — Telephone Encounter (Signed)
Patient called in wanting to schedule an EMG. She was reluctant to schedule it due to nothing being open in October. She states Dr. Ethelene Hal, whom she saw this week, states she needs it quickly. She also is going to have to change insurances starting November 1st. She would like to see if there is any way for her to have the EMG in October? I have her scheduled on 08/25/20 and she is on the wait list.

## 2020-07-12 ENCOUNTER — Other Ambulatory Visit: Payer: Self-pay | Admitting: Family Medicine

## 2020-07-12 DIAGNOSIS — R9389 Abnormal findings on diagnostic imaging of other specified body structures: Secondary | ICD-10-CM

## 2020-07-13 ENCOUNTER — Telehealth: Payer: Self-pay | Admitting: Neurology

## 2020-07-13 NOTE — Telephone Encounter (Signed)
Patient has an EMG sch for 07/14/20. She takes Ropinirole to help with her restless legs at night, she states she only takes 1/2 of a pill. She wants to make sure she can take it tonight or if she needs to hold off since her appointment is tomorrow. Please call. She said if she doesn't answer to please leave a voicemail with the answer. Thanks!

## 2020-07-13 NOTE — Telephone Encounter (Signed)
Called patient and informed her that Ropinirole is ok to take prior to her EMG Per Dr. Allena Katz. Patient verbalized understanding.   Prior to hanging up patient asked if Insurance would cover her EMG. I informed patient that we usually do not do PA's for EMG's. I advised patient if she wanted to know what was covered and if insurance would pay she would have to contact her insurance company. Patient asked if I could do it for her as "a favor" because her phone wasn't working right. I informed patient that I was unable to do so for her because a prior auth isn't needed on our end. Spoke to [employer] and was advised by her that this was discussed and advised to patient that if she wanted to know if her insurance would cover she would need to contact her insurance plan. Patient was provided codes for EMG in case she plans to call.

## 2020-07-13 NOTE — Telephone Encounter (Signed)
She may take her Ropinirole, it will not interfere with the results of the testing.

## 2020-07-14 ENCOUNTER — Other Ambulatory Visit: Payer: Self-pay

## 2020-07-14 ENCOUNTER — Ambulatory Visit (INDEPENDENT_AMBULATORY_CARE_PROVIDER_SITE_OTHER): Payer: Medicare Other | Admitting: Neurology

## 2020-07-14 DIAGNOSIS — R202 Paresthesia of skin: Secondary | ICD-10-CM | POA: Diagnosis not present

## 2020-07-14 DIAGNOSIS — R200 Anesthesia of skin: Secondary | ICD-10-CM | POA: Diagnosis not present

## 2020-07-14 NOTE — Procedures (Signed)
[facility]  [address], [address]  [place], [place] [address] Tel: [phone] Fax:  [phone] Test Date:  07/14/2020  Patient: Hannah Fisher DOB: [birth year] Physician: Nita Sickle, DO  Sex: Female Height: 5' " Ref Phys: Shon Millet, D.O.  ID#: [id number] Temp: 32.0C Technician:    Patient Complaints: This is a 69 year old female referred for evaluation of bilateral leg numbness and tingling and gait unsteadiness.  NCV & EMG Findings: Electrodiagnostic testing of the right lower extremity and additional studies of the left shows: 1. Bilateral sural and superficial peroneal sensory responses are within normal limits. 2. Bilateral peroneal and tibial motor responses are within normal limits. 3. Bilateral tibial H reflex studies are within normal limits. 4. There is no evidence of active or chronic motor axonal changes affecting any of the tested muscles.  Motor unit configuration and recruitment pattern is within normal limits.   Impression: This is a normal study of the lower extremities.  In particular, there is no evidence of a sensorimotor polyneuropathy or lumbosacral radiculopathy.     ___________________________ Nita Sickle, DO    Nerve Conduction Studies Anti Sensory Summary Table   Stim Site NR Peak (ms) Norm Peak (ms) P-T Amp (V) Norm P-T Amp  Left Sup Peroneal Anti Sensory (Ant Lat Mall)  12 cm    2.4 <4.6 7.8 >3  Right Sup Peroneal Anti Sensory (Ant Lat Mall)  12 cm    2.6 <4.6 9.5 >3  Left Sural Anti Sensory (Lat Mall)  Calf    2.6 <4.6 13.7 >3  Right Sural Anti Sensory (Lat Mall)  Calf    2.7 <4.6 11.1 >3   Motor Summary Table   Stim Site NR Onset (ms) Norm Onset (ms) O-P Amp (mV) Norm O-P Amp Site1 Site2 Delta-0 (ms) Dist (cm) Vel (m/s) Norm Vel (m/s)  Left Peroneal Motor (Ext Dig Brev)  Ankle    3.2 <6.0 3.0 >2.5 B Fib Ankle 5.2 30.0 58 >40  B Fib    8.4  2.8  Poplt B Fib 1.4 7.0 50 >40  Poplt    9.8  2.7         Right  Peroneal Motor (Ext Dig Brev)  Ankle    2.7 <6.0 2.8 >2.5 B Fib Ankle 5.7 29.0 51 >40  B Fib    8.4  2.6  Poplt B Fib 1.3 7.0 54 >40  Poplt    9.7  2.4         Left Tibial Motor (Abd Hall Brev)  Ankle    3.0 <6.0 16.8 >4 Knee Ankle 6.6 36.0 55 >40  Knee    9.6  11.8         Right Tibial Motor (Abd Hall Brev)  Ankle    3.2 <6.0 16.1 >4 Knee Ankle 6.5 38.0 58 >40  Knee    9.7  12.3          H Reflex Studies   NR H-Lat (ms) Lat Norm (ms) L-R H-Lat (ms)  Left Tibial (Gastroc)     30.75 <35 0.00  Right Tibial (Gastroc)     30.75 <35 0.00   EMG   Side Muscle Ins Act Fibs Psw Fasc Number Recrt Dur Dur. Amp Amp. Poly Poly. Comment  Right AntTibialis Nml Nml Nml Nml Nml Nml Nml Nml Nml Nml Nml Nml N/A  Left AntTibialis Nml Nml Nml Nml Nml Nml Nml Nml Nml Nml Nml Nml N/A  Left Gastroc Nml Nml Nml Nml Nml  Nml Nml Nml Nml Nml Nml Nml N/A  Left Flex Dig Long Nml Nml Nml Nml Nml Nml Nml Nml Nml Nml Nml Nml N/A  Left RectFemoris Nml Nml Nml Nml Nml Nml Nml Nml Nml Nml Nml Nml N/A  Left GluteusMed Nml Nml Nml Nml Nml Nml Nml Nml Nml Nml Nml Nml N/A  Right Gastroc Nml Nml Nml Nml Nml Nml Nml Nml Nml Nml Nml Nml N/A  Right Flex Dig Long Nml Nml Nml Nml Nml Nml Nml Nml Nml Nml Nml Nml N/A  Right RectFemoris Nml Nml Nml Nml Nml Nml Nml Nml Nml Nml Nml Nml N/A  Right GluteusMed Nml Nml Nml Nml Nml Nml Nml Nml Nml Nml Nml Nml N/A      Waveforms:

## 2020-07-15 ENCOUNTER — Other Ambulatory Visit: Payer: Self-pay

## 2020-07-15 ENCOUNTER — Telehealth: Payer: Self-pay | Admitting: Neurology

## 2020-07-15 DIAGNOSIS — R270 Ataxia, unspecified: Secondary | ICD-10-CM

## 2020-07-15 DIAGNOSIS — R200 Anesthesia of skin: Secondary | ICD-10-CM

## 2020-07-15 DIAGNOSIS — R202 Paresthesia of skin: Secondary | ICD-10-CM

## 2020-07-15 NOTE — Telephone Encounter (Signed)
LMOVM, We always to a Pa for pt Mri

## 2020-07-15 NOTE — Telephone Encounter (Signed)
Patient wanted to verify that the imaging that Dr Everlena Cooper is ordering will be covered by insurance. Please verify before ordering. Patient requesting call back. Thanks!

## 2020-07-19 DIAGNOSIS — R030 Elevated blood-pressure reading, without diagnosis of hypertension: Secondary | ICD-10-CM | POA: Diagnosis not present

## 2020-07-19 DIAGNOSIS — E46 Unspecified protein-calorie malnutrition: Secondary | ICD-10-CM | POA: Diagnosis not present

## 2020-07-19 DIAGNOSIS — F419 Anxiety disorder, unspecified: Secondary | ICD-10-CM | POA: Diagnosis not present

## 2020-07-19 DIAGNOSIS — G2581 Restless legs syndrome: Secondary | ICD-10-CM | POA: Diagnosis not present

## 2020-07-23 ENCOUNTER — Other Ambulatory Visit: Payer: Medicare Other

## 2020-07-28 DIAGNOSIS — H2513 Age-related nuclear cataract, bilateral: Secondary | ICD-10-CM | POA: Diagnosis not present

## 2020-07-28 DIAGNOSIS — H5712 Ocular pain, left eye: Secondary | ICD-10-CM | POA: Diagnosis not present

## 2020-07-28 DIAGNOSIS — H0102B Squamous blepharitis left eye, upper and lower eyelids: Secondary | ICD-10-CM | POA: Diagnosis not present

## 2020-07-28 DIAGNOSIS — H0102A Squamous blepharitis right eye, upper and lower eyelids: Secondary | ICD-10-CM | POA: Diagnosis not present

## 2020-08-03 ENCOUNTER — Telehealth: Payer: Self-pay | Admitting: Neurology

## 2020-08-03 NOTE — Telephone Encounter (Signed)
Please see note and thank you

## 2020-08-03 NOTE — Telephone Encounter (Signed)
Patient wants to make sure that we contacted her [employer] Medicare  For prior auth for the MRI for her neck and head that she is having on Nov 9th Please  Call

## 2020-08-05 DIAGNOSIS — M5416 Radiculopathy, lumbar region: Secondary | ICD-10-CM | POA: Diagnosis not present

## 2020-08-05 NOTE — Telephone Encounter (Signed)
Called patient and left her a voicemail that I submitted a Urgent Appeal to her insurance for the denial of the MRI Cervical Spine. Awaiting to hear back from them. I will call insurance tomorrow afternoon for an update and then let her know info as soon as I speak with them.

## 2020-08-09 NOTE — Telephone Encounter (Signed)
Appeal was denied. There are no other appeal steps to take at this time. Per insurance we have to wait 180 days to resubmit PA for the MRI Cervical Spine. Denial was because patient hasn't experienced and pain or weakness within her arms.  Per Daughter Message: "Hi Chelsea,  Thank you for the update.  I shared with her and asked her about her arms.  She has noticed weakness but no pain.    I am not sure if this helps or if she already mentioned, but she also has had a big change in her writing.  It is much smaller and much different than before.  She also has a hard time writing with shaky hands.    If there is anything else I can help to ask her about or provide confirmation on related to symptoms as there are many, please let me know.    Best Regards,  [name]"   If you want me to resubmit PA in 180 days, can we please addend last OV note to add the new medical info above?   Patient is scheduled for tomorrow 11/9 to complete her MRI Brain.

## 2020-08-09 NOTE — Telephone Encounter (Signed)
I made an addendum to the note in July reflecting that she now endorses bilateral arm weakness.

## 2020-08-10 ENCOUNTER — Other Ambulatory Visit: Payer: Self-pay

## 2020-08-10 ENCOUNTER — Other Ambulatory Visit: Payer: Medicare Other

## 2020-08-10 ENCOUNTER — Ambulatory Visit
Admission: RE | Admit: 2020-08-10 | Discharge: 2020-08-10 | Disposition: A | Discharge status: Home / Self Care | Payer: Medicare Other | Source: Ambulatory Visit | Attending: Neurology | Admitting: Neurology

## 2020-08-10 DIAGNOSIS — R270 Ataxia, unspecified: Secondary | ICD-10-CM | POA: Diagnosis not present

## 2020-08-10 DIAGNOSIS — R200 Anesthesia of skin: Secondary | ICD-10-CM

## 2020-08-10 DIAGNOSIS — I6782 Cerebral ischemia: Secondary | ICD-10-CM | POA: Diagnosis not present

## 2020-08-10 DIAGNOSIS — R202 Paresthesia of skin: Secondary | ICD-10-CM | POA: Diagnosis not present

## 2020-08-11 ENCOUNTER — Telehealth: Payer: Self-pay

## 2020-08-11 NOTE — Telephone Encounter (Signed)
Pt advised of her MRI results.  Pt wanted to know what the next steps will be.

## 2020-08-12 ENCOUNTER — Other Ambulatory Visit: Payer: Self-pay

## 2020-08-12 DIAGNOSIS — R200 Anesthesia of skin: Secondary | ICD-10-CM

## 2020-08-12 NOTE — Progress Notes (Signed)
Per agreement MRI of Cervical spine ordered

## 2020-08-12 NOTE — Telephone Encounter (Signed)
Because she now endorses bilateral arm weakness, MRI of cervical spine without contrast

## 2020-08-16 DIAGNOSIS — R42 Dizziness and giddiness: Secondary | ICD-10-CM | POA: Diagnosis not present

## 2020-08-16 DIAGNOSIS — J019 Acute sinusitis, unspecified: Secondary | ICD-10-CM | POA: Diagnosis not present

## 2020-08-16 DIAGNOSIS — R030 Elevated blood-pressure reading, without diagnosis of hypertension: Secondary | ICD-10-CM | POA: Diagnosis not present

## 2020-08-24 DIAGNOSIS — M5416 Radiculopathy, lumbar region: Secondary | ICD-10-CM | POA: Diagnosis not present

## 2020-08-25 ENCOUNTER — Encounter: Payer: Medicare Other | Admitting: Neurology

## 2020-09-07 ENCOUNTER — Other Ambulatory Visit: Payer: Self-pay

## 2020-09-07 ENCOUNTER — Ambulatory Visit
Admission: RE | Admit: 2020-09-07 | Discharge: 2020-09-07 | Disposition: A | Discharge status: Home / Self Care | Payer: Medicare Other | Source: Ambulatory Visit | Attending: Neurology | Admitting: Neurology

## 2020-09-07 DIAGNOSIS — R202 Paresthesia of skin: Secondary | ICD-10-CM

## 2020-09-07 DIAGNOSIS — M4802 Spinal stenosis, cervical region: Secondary | ICD-10-CM | POA: Diagnosis not present

## 2020-09-07 DIAGNOSIS — R200 Anesthesia of skin: Secondary | ICD-10-CM

## 2020-09-07 DIAGNOSIS — R270 Ataxia, unspecified: Secondary | ICD-10-CM

## 2020-09-09 DIAGNOSIS — M5416 Radiculopathy, lumbar region: Secondary | ICD-10-CM | POA: Diagnosis not present

## 2020-09-13 ENCOUNTER — Telehealth: Payer: Self-pay | Admitting: Neurology

## 2020-09-13 NOTE — Telephone Encounter (Signed)
Pt daughter advised of Dr.Jaffe note 12/8 My workup is unremarkable. I do not have any answer for her symptoms. I recommend following up with her PCP. She may need a second opinion from another neurologist.   Per Daughter they will discuss with her PCp and let us know if she wants a second opinion.

## 2020-09-13 NOTE — Telephone Encounter (Signed)
Patient daughter called and has some questions about the results of the test her mother had done

## 2020-09-17 DIAGNOSIS — R040 Epistaxis: Secondary | ICD-10-CM | POA: Diagnosis not present

## 2020-09-17 DIAGNOSIS — J019 Acute sinusitis, unspecified: Secondary | ICD-10-CM | POA: Diagnosis not present

## 2020-09-17 DIAGNOSIS — H6980 Other specified disorders of Eustachian tube, unspecified ear: Secondary | ICD-10-CM | POA: Diagnosis not present

## 2020-09-27 ENCOUNTER — Telehealth: Payer: Self-pay

## 2020-09-27 DIAGNOSIS — R2681 Unsteadiness on feet: Secondary | ICD-10-CM

## 2020-09-27 DIAGNOSIS — R200 Anesthesia of skin: Secondary | ICD-10-CM

## 2020-09-27 NOTE — Telephone Encounter (Signed)
Pt advised referral for [facility] was sent and they will call her schedule once the referral coordinator reviews her records.   Records faxed over twice to [phone]

## 2020-09-28 NOTE — Telephone Encounter (Signed)
Complete

## 2020-10-05 ENCOUNTER — Telehealth: Payer: Self-pay | Admitting: Neurology

## 2020-10-05 NOTE — Telephone Encounter (Signed)
Patient states that she was referred to a neuromuscular specialist  In [facility] but that trip would be hard for her and the family to make so she wants to know if there is someone that is in [place] that she could see.   You may leave avery detail message on her VM if she does not answer per patient

## 2020-10-06 NOTE — Telephone Encounter (Signed)
Message sent to pt daughter. Please research provider and I can send a new referral.   Referral to [facility] on 12/27. Please refer back to note 09/27/20

## 2020-10-11 DIAGNOSIS — R2689 Other abnormalities of gait and mobility: Secondary | ICD-10-CM | POA: Diagnosis not present

## 2020-10-13 DIAGNOSIS — R2689 Other abnormalities of gait and mobility: Secondary | ICD-10-CM | POA: Diagnosis not present

## 2020-10-21 DIAGNOSIS — L853 Xerosis cutis: Secondary | ICD-10-CM | POA: Diagnosis not present

## 2020-10-21 DIAGNOSIS — B351 Tinea unguium: Secondary | ICD-10-CM | POA: Diagnosis not present

## 2020-10-21 DIAGNOSIS — L821 Other seborrheic keratosis: Secondary | ICD-10-CM | POA: Diagnosis not present

## 2020-10-21 DIAGNOSIS — D1801 Hemangioma of skin and subcutaneous tissue: Secondary | ICD-10-CM | POA: Diagnosis not present

## 2020-10-28 DIAGNOSIS — R2689 Other abnormalities of gait and mobility: Secondary | ICD-10-CM | POA: Diagnosis not present

## 2020-11-03 DIAGNOSIS — R2689 Other abnormalities of gait and mobility: Secondary | ICD-10-CM | POA: Diagnosis not present

## 2020-11-04 DIAGNOSIS — L601 Onycholysis: Secondary | ICD-10-CM | POA: Diagnosis not present

## 2020-11-12 DIAGNOSIS — R2689 Other abnormalities of gait and mobility: Secondary | ICD-10-CM | POA: Diagnosis not present

## 2020-11-12 DIAGNOSIS — J019 Acute sinusitis, unspecified: Secondary | ICD-10-CM | POA: Diagnosis not present

## 2020-11-18 DIAGNOSIS — R0781 Pleurodynia: Secondary | ICD-10-CM | POA: Diagnosis not present

## 2020-11-25 DIAGNOSIS — M79674 Pain in right toe(s): Secondary | ICD-10-CM | POA: Diagnosis not present

## 2020-11-25 DIAGNOSIS — M2560 Stiffness of unspecified joint, not elsewhere classified: Secondary | ICD-10-CM | POA: Diagnosis not present

## 2020-11-25 DIAGNOSIS — R0781 Pleurodynia: Secondary | ICD-10-CM | POA: Diagnosis not present

## 2020-12-02 ENCOUNTER — Telehealth: Payer: Self-pay | Admitting: Neurology

## 2020-12-02 NOTE — Telephone Encounter (Signed)
Patient called and asked if Tat would see her for her problem because it will take so long to get into [place], where [facility] wanted her to go to. Tat does not see her Dx she does not have a movement disorder as Tat has looked at the notes from [place] and [place].   Hannah Fisher has nothing more to offer the patient she really needs to go to [facility] to be seen for her DX    Patient  Daughter [name] was informed of this

## 2020-12-28 ENCOUNTER — Inpatient Hospital Stay (HOSPITAL_COMMUNITY)
Admission: EM | Admit: 2020-12-28 | Discharge: 2021-01-04 | DRG: 640 | Disposition: A | Discharge status: Skilled Nursing Facility | Payer: Medicare Other | Attending: Internal Medicine | Admitting: Internal Medicine

## 2020-12-28 ENCOUNTER — Other Ambulatory Visit: Payer: Self-pay

## 2020-12-28 ENCOUNTER — Encounter (HOSPITAL_COMMUNITY): Payer: Self-pay | Admitting: Internal Medicine

## 2020-12-28 ENCOUNTER — Emergency Department (HOSPITAL_COMMUNITY): Payer: Medicare Other

## 2020-12-28 DIAGNOSIS — Z043 Encounter for examination and observation following other accident: Secondary | ICD-10-CM | POA: Diagnosis not present

## 2020-12-28 DIAGNOSIS — E860 Dehydration: Secondary | ICD-10-CM | POA: Diagnosis not present

## 2020-12-28 DIAGNOSIS — S0003XA Contusion of scalp, initial encounter: Secondary | ICD-10-CM | POA: Diagnosis not present

## 2020-12-28 DIAGNOSIS — Z87442 Personal history of urinary calculi: Secondary | ICD-10-CM | POA: Diagnosis not present

## 2020-12-28 DIAGNOSIS — M25552 Pain in left hip: Secondary | ICD-10-CM | POA: Diagnosis present

## 2020-12-28 DIAGNOSIS — Z882 Allergy status to sulfonamides status: Secondary | ICD-10-CM | POA: Diagnosis not present

## 2020-12-28 DIAGNOSIS — Z20822 Contact with and (suspected) exposure to covid-19: Secondary | ICD-10-CM | POA: Diagnosis present

## 2020-12-28 DIAGNOSIS — W19XXXA Unspecified fall, initial encounter: Secondary | ICD-10-CM | POA: Diagnosis present

## 2020-12-28 DIAGNOSIS — S199XXA Unspecified injury of neck, initial encounter: Secondary | ICD-10-CM | POA: Diagnosis not present

## 2020-12-28 DIAGNOSIS — R531 Weakness: Secondary | ICD-10-CM | POA: Diagnosis not present

## 2020-12-28 DIAGNOSIS — M4182 Other forms of scoliosis, cervical region: Secondary | ICD-10-CM | POA: Diagnosis not present

## 2020-12-28 DIAGNOSIS — Z0389 Encounter for observation for other suspected diseases and conditions ruled out: Secondary | ICD-10-CM | POA: Diagnosis not present

## 2020-12-28 DIAGNOSIS — K5900 Constipation, unspecified: Secondary | ICD-10-CM | POA: Diagnosis not present

## 2020-12-28 DIAGNOSIS — R7989 Other specified abnormal findings of blood chemistry: Secondary | ICD-10-CM

## 2020-12-28 DIAGNOSIS — Z90710 Acquired absence of both cervix and uterus: Secondary | ICD-10-CM

## 2020-12-28 DIAGNOSIS — R4182 Altered mental status, unspecified: Secondary | ICD-10-CM

## 2020-12-28 DIAGNOSIS — Z681 Body mass index (BMI) 19 or less, adult: Secondary | ICD-10-CM

## 2020-12-28 DIAGNOSIS — Z823 Family history of stroke: Secondary | ICD-10-CM

## 2020-12-28 DIAGNOSIS — Z880 Allergy status to penicillin: Secondary | ICD-10-CM | POA: Diagnosis not present

## 2020-12-28 DIAGNOSIS — M25551 Pain in right hip: Secondary | ICD-10-CM | POA: Diagnosis present

## 2020-12-28 DIAGNOSIS — Y92009 Unspecified place in unspecified non-institutional (private) residence as the place of occurrence of the external cause: Secondary | ICD-10-CM | POA: Diagnosis not present

## 2020-12-28 DIAGNOSIS — M25559 Pain in unspecified hip: Secondary | ICD-10-CM

## 2020-12-28 DIAGNOSIS — G9340 Encephalopathy, unspecified: Secondary | ICD-10-CM | POA: Diagnosis not present

## 2020-12-28 DIAGNOSIS — T796XXD Traumatic ischemia of muscle, subsequent encounter: Secondary | ICD-10-CM | POA: Diagnosis not present

## 2020-12-28 DIAGNOSIS — H9192 Unspecified hearing loss, left ear: Secondary | ICD-10-CM | POA: Diagnosis present

## 2020-12-28 DIAGNOSIS — M6282 Rhabdomyolysis: Secondary | ICD-10-CM | POA: Diagnosis not present

## 2020-12-28 DIAGNOSIS — M439 Deforming dorsopathy, unspecified: Secondary | ICD-10-CM | POA: Diagnosis not present

## 2020-12-28 DIAGNOSIS — G9349 Other encephalopathy: Secondary | ICD-10-CM | POA: Diagnosis not present

## 2020-12-28 DIAGNOSIS — R296 Repeated falls: Secondary | ICD-10-CM | POA: Diagnosis not present

## 2020-12-28 DIAGNOSIS — D72829 Elevated white blood cell count, unspecified: Secondary | ICD-10-CM | POA: Diagnosis not present

## 2020-12-28 DIAGNOSIS — T796XXA Traumatic ischemia of muscle, initial encounter: Secondary | ICD-10-CM | POA: Diagnosis present

## 2020-12-28 DIAGNOSIS — Z8249 Family history of ischemic heart disease and other diseases of the circulatory system: Secondary | ICD-10-CM

## 2020-12-28 DIAGNOSIS — E872 Acidosis, unspecified: Secondary | ICD-10-CM

## 2020-12-28 DIAGNOSIS — N179 Acute kidney failure, unspecified: Secondary | ICD-10-CM

## 2020-12-28 DIAGNOSIS — E43 Unspecified severe protein-calorie malnutrition: Secondary | ICD-10-CM | POA: Diagnosis present

## 2020-12-28 DIAGNOSIS — M795 Residual foreign body in soft tissue: Secondary | ICD-10-CM

## 2020-12-28 DIAGNOSIS — R339 Retention of urine, unspecified: Secondary | ICD-10-CM | POA: Diagnosis present

## 2020-12-28 DIAGNOSIS — E876 Hypokalemia: Secondary | ICD-10-CM | POA: Diagnosis not present

## 2020-12-28 DIAGNOSIS — R410 Disorientation, unspecified: Secondary | ICD-10-CM | POA: Diagnosis not present

## 2020-12-28 DIAGNOSIS — I10 Essential (primary) hypertension: Secondary | ICD-10-CM | POA: Diagnosis not present

## 2020-12-28 DIAGNOSIS — R0902 Hypoxemia: Secondary | ICD-10-CM | POA: Diagnosis not present

## 2020-12-28 DIAGNOSIS — W19XXXD Unspecified fall, subsequent encounter: Secondary | ICD-10-CM | POA: Diagnosis not present

## 2020-12-28 DIAGNOSIS — M47812 Spondylosis without myelopathy or radiculopathy, cervical region: Secondary | ICD-10-CM | POA: Diagnosis not present

## 2020-12-28 DIAGNOSIS — Z7982 Long term (current) use of aspirin: Secondary | ICD-10-CM

## 2020-12-28 DIAGNOSIS — Z79899 Other long term (current) drug therapy: Secondary | ICD-10-CM

## 2020-12-28 LAB — COMPREHENSIVE METABOLIC PANEL
ALT: 66 U/L — ABNORMAL HIGH (ref 0–44)
AST: 142 U/L — ABNORMAL HIGH (ref 15–41)
Albumin: 4.1 g/dL (ref 3.5–5.0)
Alkaline Phosphatase: 69 U/L (ref 38–126)
Anion gap: 19 — ABNORMAL HIGH (ref 5–15)
BUN: 58 mg/dL — ABNORMAL HIGH (ref 8–23)
CO2: 15 mmol/L — ABNORMAL LOW (ref 22–32)
Calcium: 9.5 mg/dL (ref 8.9–10.3)
Chloride: 107 mmol/L (ref 98–111)
Creatinine, Ser: 1.27 mg/dL — ABNORMAL HIGH (ref 0.44–1.00)
GFR, Estimated: 45 mL/min — ABNORMAL LOW (ref >60)
Glucose, Bld: 155 mg/dL — ABNORMAL HIGH (ref 70–99)
Potassium: 3.2 mmol/L — ABNORMAL LOW (ref 3.5–5.1)
Sodium: 141 mmol/L (ref 135–145)
Total Bilirubin: 1.6 mg/dL — ABNORMAL HIGH (ref 0.3–1.2)
Total Protein: 6.7 g/dL (ref 6.5–8.1)

## 2020-12-28 LAB — URINALYSIS, ROUTINE W REFLEX MICROSCOPIC
Bacteria, UA: NONE SEEN
Bilirubin Urine: NEGATIVE
Glucose, UA: NEGATIVE mg/dL
Ketones, ur: 80 mg/dL — AB
Leukocytes,Ua: NEGATIVE
Nitrite: NEGATIVE
Protein, ur: NEGATIVE mg/dL
Specific Gravity, Urine: 1.017 (ref 1.005–1.030)
pH: 5 (ref 5.0–8.0)

## 2020-12-28 LAB — CBC WITH DIFFERENTIAL/PLATELET
Abs Immature Granulocytes: 0.15 10*3/uL — ABNORMAL HIGH (ref 0.00–0.07)
Basophils Absolute: 0 10*3/uL (ref 0.0–0.1)
Basophils Relative: 0 %
Eosinophils Absolute: 0 10*3/uL (ref 0.0–0.5)
Eosinophils Relative: 0 %
HCT: 43 % (ref 36.0–46.0)
Hemoglobin: 14.1 g/dL (ref 12.0–15.0)
Immature Granulocytes: 1 %
Lymphocytes Relative: 3 %
Lymphs Abs: 0.7 10*3/uL (ref 0.7–4.0)
MCH: 30.7 pg (ref 26.0–34.0)
MCHC: 32.8 g/dL (ref 30.0–36.0)
MCV: 93.5 fL (ref 80.0–100.0)
Monocytes Absolute: 1.3 10*3/uL — ABNORMAL HIGH (ref 0.1–1.0)
Monocytes Relative: 6 %
Neutro Abs: 19.6 10*3/uL — ABNORMAL HIGH (ref 1.7–7.7)
Neutrophils Relative %: 90 %
Platelets: 350 10*3/uL (ref 150–400)
RBC: 4.6 MIL/uL (ref 3.87–5.11)
RDW: 12.4 % (ref 11.5–15.5)
WBC: 21.8 10*3/uL — ABNORMAL HIGH (ref 4.0–10.5)
nRBC: 0 % (ref 0.0–0.2)

## 2020-12-28 LAB — RESP PANEL BY RT-PCR (FLU A&B, COVID) ARPGX2
Influenza A by PCR: NEGATIVE
Influenza B by PCR: NEGATIVE
SARS Coronavirus 2 by RT PCR: NEGATIVE

## 2020-12-28 LAB — LACTIC ACID, PLASMA: Lactic Acid, Venous: 2.6 mmol/L (ref 0.5–1.9)

## 2020-12-28 LAB — VITAMIN B12: Vitamin B-12: 644 pg/mL (ref 180–914)

## 2020-12-28 LAB — CK: Total CK: 5980 U/L — ABNORMAL HIGH (ref 38–234)

## 2020-12-28 LAB — TSH: TSH: 0.436 u[IU]/mL (ref 0.350–4.500)

## 2020-12-28 LAB — CBG MONITORING, ED: Glucose-Capillary: 140 mg/dL — ABNORMAL HIGH (ref 70–99)

## 2020-12-28 MED ORDER — MAGNESIUM OXIDE 400 (241.3 MG) MG PO TABS
400.0000 mg | ORAL_TABLET | Freq: Every day | ORAL | Status: DC
  Administered 2020-12-29: 400 mg via ORAL
  Filled 2020-12-28: qty 1

## 2020-12-28 MED ORDER — ROPINIROLE HCL 0.25 MG PO TABS
0.2500 mg | ORAL_TABLET | ORAL | Status: DC | PRN
  Filled 2020-12-28: qty 1

## 2020-12-28 MED ORDER — ACETAMINOPHEN 650 MG RE SUPP: 650.0000 mg | Freq: Four times a day (QID) | RECTAL | Status: DC | PRN

## 2020-12-28 MED ORDER — LACTATED RINGERS IV SOLN: INTRAVENOUS | Status: DC

## 2020-12-28 MED ORDER — POTASSIUM CHLORIDE 10 MEQ/100ML IV SOLN
10.0000 meq | Freq: Once | INTRAVENOUS | Status: AC
  Administered 2020-12-28: 10 meq via INTRAVENOUS
  Filled 2020-12-28: qty 100

## 2020-12-28 MED ORDER — ACETAMINOPHEN 325 MG PO TABS
650.0000 mg | ORAL_TABLET | Freq: Four times a day (QID) | ORAL | Status: DC | PRN
  Administered 2021-01-02 – 2021-01-04 (×5): 650 mg via ORAL
  Filled 2020-12-28 (×5): qty 2

## 2020-12-28 MED ORDER — ALPRAZOLAM 0.5 MG PO TABS: 0.5000 mg | ORAL_TABLET | Freq: Two times a day (BID) | ORAL | Status: DC | PRN

## 2020-12-28 MED ORDER — SODIUM CHLORIDE 0.9 % IV BOLUS: 1000.0000 mL | Freq: Once | INTRAVENOUS | Status: DC

## 2020-12-28 MED ORDER — LORAZEPAM 2 MG/ML IJ SOLN
0.2500 mg | Freq: Once | INTRAMUSCULAR | Status: AC
  Administered 2020-12-29: 0.25 mg via INTRAVENOUS
  Filled 2020-12-28: qty 1

## 2020-12-28 MED ORDER — LACTATED RINGERS IV BOLUS
1000.0000 mL | Freq: Once | INTRAVENOUS | Status: AC
  Administered 2020-12-28: 1000 mL via INTRAVENOUS

## 2020-12-28 MED ORDER — LISINOPRIL 2.5 MG PO TABS
2.5000 mg | ORAL_TABLET | Freq: Every day | ORAL | Status: DC
  Filled 2020-12-28: qty 1

## 2020-12-28 NOTE — ED Provider Notes (Signed)
[facility] EMERGENCY DEPARTMENT Provider Note  CSN: [id number] Arrival date & time: 12/28/20 1559    History Chief Complaint  Patient presents with  . Fall  . Altered Mental Status    HPI  Hannah Fisher is a 70 y.o. female brought to the ED via EMS. Patient is confused and unable to give any clear history (rambles about horses). Daughter at bedside provides much of the history. She reports patient lives home alone and had been independent until about a year ago when she started having weakness, leg muscle tightness and tremors. She has been to see neurology with no definite diagnosis. She has had occasional falls and more difficulty with her ADLs, but daughter reports she has refused home health/aids or any other help. The daughter last spoke to her 2 days ago and last saw her face to face about a week ago. Today she could not get the patient to answer the phone so she went over to her house and found her on the floor, on her left side in fetal position. She has an acute change in her mental status, typically is able to carry a conversation.    Past Medical History:  Diagnosis Date  . Arthritis    hands, knees  . Deafness    left ear sue to shingles  . Hypertension   . Kidney stones   . Osteoporosis 2009   -2.6  . Shingles     Past Surgical History:  Procedure Laterality Date  . MASS EXCISION Right 05/30/2018   Procedure: EXCISION MASS RIGHT SMALL FINGER;  Surgeon: Cindee Salt, MD;  Location:  SURGERY CENTER;  Service: Orthopedics;  Laterality: Right;  . TONSILLECTOMY    . VAGINAL HYSTERECTOMY  1992    Family History  Problem Relation Age of Onset  . Hypertension Mother   . Stroke Mother   . Hypertension Sister   . Hypertension Brother   . Breast cancer Neg Hx     Social History   Tobacco Use  . Smoking status: Never Smoker  . Smokeless tobacco: Never Used  Vaping Use  . Vaping Use: Never used  Substance Use Topics  . Alcohol use: Yes     Comment: ocaas  . Drug use: No     Home Medications Prior to Admission medications   Medication Sig Start Date End Date Taking? Authorizing Provider  ALPRAZolam Prudy Feeler) 0.5 MG tablet Take 0.5 mg by mouth 2 (two) times daily as needed. 03/03/20   [provider]  aspirin 81 MG tablet Take 81 mg by mouth daily.   Patient not taking: Reported on 04/08/2020    [provider]  chlorthalidone (HYGROTON) 25 MG tablet Take 1 tablet (25 mg total) by mouth daily. 01/22/20 04/21/20  Quintella Reichert, MD  cholecalciferol (VITAMIN D) 1000 units tablet Take 1,000 Units by mouth daily.    [provider]  lisinopril (PRINIVIL,ZESTRIL) 2.5 MG tablet Take 2.5 mg by mouth daily.      [provider]  magnesium oxide (MAG-OX) 400 MG tablet Take 400 mg by mouth daily.    [provider]  Multiple Vitamin (MULTIVITAMIN) tablet Take 1 tablet by mouth daily.      [provider]  rOPINIRole (REQUIP) 0.25 MG tablet Take 0.2 mg by mouth as needed. 03/30/20   [provider]     Allergies    Penicillin v, Penicillins, and Sulfa antibiotics   Review of Systems   Review of Systems Unable to  assess due to mental status.     Physical Exam BP 126/78   Pulse (!) 106   Temp 98.7 F (37.1 C) (Oral)   Resp 16   Ht 5\' 1"  (1.549 m)   Wt 43.1 kg   LMP 10/02/1990   SpO2 96%   BMI 17.95 kg/m   Physical Exam Vitals and nursing note reviewed.  Constitutional:      Appearance: Normal appearance.     Comments: thin  HENT:     Head: Normocephalic and atraumatic.     Nose: Nose normal.     Mouth/Throat:     Mouth: Mucous membranes are dry.  Eyes:     Extraocular Movements: Extraocular movements intact.     Conjunctiva/sclera: Conjunctivae normal.  Neck:     Comments: In c-collar Cardiovascular:     Rate and Rhythm: Normal rate.  Pulmonary:     Effort: Pulmonary effort is normal.     Breath sounds: Normal breath sounds.  Abdominal:     General:  Abdomen is flat.     Palpations: Abdomen is soft.     Tenderness: There is no abdominal tenderness.  Musculoskeletal:        General: No swelling. Normal range of motion.  Skin:    General: Skin is warm and dry.  Neurological:     General: No focal deficit present.     Mental Status: She is alert. She is disoriented.     Cranial Nerves: No cranial nerve deficit.     Comments: Difficult to fully assess, moves all extremities, no obvious focal deficit      ED Results / Procedures / Treatments   Labs (all labs ordered are listed, but only abnormal results are displayed) Labs Reviewed  CBC WITH DIFFERENTIAL/PLATELET - Abnormal; Notable for the following components:      Result Value   WBC 21.8 (*)    Neutro Abs 19.6 (*)    Monocytes Absolute 1.3 (*)    Abs Immature Granulocytes 0.15 (*)    All other components within normal limits  COMPREHENSIVE METABOLIC PANEL - Abnormal; Notable for the following components:   Potassium 3.2 (*)    CO2 15 (*)    Glucose, Bld 155 (*)    BUN 58 (*)    Creatinine, Ser 1.27 (*)    AST 142 (*)    ALT 66 (*)    Total Bilirubin 1.6 (*)    GFR, Estimated 45 (*)    Anion gap 19 (*)    All other components within normal limits  URINALYSIS, ROUTINE W REFLEX MICROSCOPIC - Abnormal; Notable for the following components:   Hgb urine dipstick MODERATE (*)    Ketones, ur 80 (*)    All other components within normal limits  CK - Abnormal; Notable for the following components:   Total CK 5,980 (*)    All other components within normal limits  CBG MONITORING, ED - Abnormal; Notable for the following components:   Glucose-Capillary 140 (*)    All other components within normal limits  RESP PANEL BY RT-PCR (FLU A&B, COVID) [id number]    EKG EKG Interpretation  Date/Time:  Tuesday December 28 2020 16:00:45 EDT Ventricular Rate:  97 PR Interval:  131 QRS Duration: 105 QT Interval:  366 QTC Calculation: 465 R Axis:   70 Text Interpretation: Sinus rhythm  RAE, consider biatrial enlargement Artifact in lead(s) I II III aVR aVL aVF V1 V2 accounting for artifact, no significant change since last tracing Confirmed  by Susy FrizzleSheldon, Genna Casimir [phone]([id number]) on 12/28/2020 4:04:07 PM   Radiology DG Chest 2 View  Result Date: 12/28/2020 CLINICAL DATA:  Altered mental status, fall EXAM: CHEST - 2 VIEW COMPARISON:  05/14/2020, 11/18/2020 FINDINGS: No focal opacity or pleural effusion. Cardiomediastinal silhouette within normal limits. Aortic atherosclerosis. No pneumothorax. IMPRESSION: No active cardiopulmonary disease. Electronically Signed   By: Jasmine PangKim  Fujinaga M.D.   On: 12/28/2020 17:00   CT Head Wo Contrast  Result Date: 12/28/2020 CLINICAL DATA:  Mental status change, fell, trauma EXAM: CT HEAD WITHOUT CONTRAST TECHNIQUE: Contiguous axial images were obtained from the base of the skull through the vertex without intravenous contrast. COMPARISON:  08/10/2020 FINDINGS: Brain: No acute infarct or hemorrhage. Lateral ventricles and midline structures are stable. No acute extra-axial fluid collections. No mass effect. Vascular: No hyperdense vessel or unexpected calcification. Skull: Normal. Negative for fracture or focal lesion. Sinuses/Orbits: No acute finding. Other: None. IMPRESSION: 1. No acute intracranial process. Electronically Signed   By: Sharlet SalinaMichael  Brown M.D.   On: 12/28/2020 17:29   CT Cervical Spine Wo Contrast  Result Date: 12/28/2020 CLINICAL DATA:  Larey SeatFell, trauma EXAM: CT CERVICAL SPINE WITHOUT CONTRAST TECHNIQUE: Multidetector CT imaging of the cervical spine was performed without intravenous contrast. Multiplanar CT image reconstructions were also generated. COMPARISON:  09/07/2020 FINDINGS: Alignment: Right convex curvature of the cervical spine. Otherwise alignment is anatomic. Skull base and vertebrae: No acute fracture. No primary bone lesion or focal pathologic process. Soft tissues and spinal canal: No prevertebral fluid or swelling. No visible canal  hematoma. Disc levels: Prominent spondylosis at C4-5 and C5-6. Mild symmetrical neural foraminal encroachment at C4-5. Upper chest: Airway is patent.  Lung apices are clear. Other: Reconstructed images demonstrate no additional findings. IMPRESSION: 1. Mild cervical spondylosis and right convex scoliosis. No acute cervical spine fracture. Electronically Signed   By: Sharlet SalinaMichael  Brown M.D.   On: 12/28/2020 17:31    Procedures Procedures  Medications Ordered in the ED Medications  lactated ringers bolus 1,000 mL (1,000 mLs Intravenous New Bag/Given 12/28/20 2032)     MDM Rules/Calculators/A&P MDM Patient with fall at home, unwitnessed unknown down time. Will check labs including CK for rhabdo, CT head/cpsine. Anticipate admission for AMS.   ED Course  I have reviewed the triage vital signs and the nursing notes.  Pertinent labs & imaging results that were available during my care of the patient were reviewed by me and considered in my medical decision making (see chart for details).  Clinical Course as of 12/28/20 2049  Tue Dec 28, 2020  1832 CBC with leukocytosis, no other signs of sepsis. CT head and C-spine neg for acute process. C-collar removed.  [CS]  1859 CMP with elevated BUN/Cr above baseline with anion gap acidosis. Gluc not significantly elevated, no concern for DKA.  [CS]  2008 CK is moderately elevated, likely mild or early rhabdo. Will hydrate with IVF  [CS]  2039 UA with ketones but no signs of infection. Unclear where her leukocytosis is coming from. She is still confused and not making much sense when talking. Will discuss admission with the hospitalist. Daughter at bedside is aware of the plan.  [CS]  2048 Spoke with Dr. Toniann FailKakrakandy, Hospitalist who will evaluate the patient for admission.  [CS]    Clinical Course User Index [CS] Pollyann SavoySheldon, Breniyah Romm B, MD    Final Clinical Impression(s) / ED Diagnoses Final diagnoses:  Altered mental status, unspecified altered mental  status type  AKI (acute kidney injury) (HCC)  Metabolic acidosis  Traumatic rhabdomyolysis, initial encounter [facility])    Rx / DC Orders ED Discharge Orders    None       Pollyann Savoy, MD 12/28/20 2049

## 2020-12-28 NOTE — ED Notes (Signed)
Pt alert, disoriented x3, pt speaking with this RN in loose sentences and asking incomplete questions, pt daughter at bedside prior to leaving for evening reported "pt speaking strangely, not making much sense". Pt provided brief and pure wick in place for elimination needs at this time.

## 2020-12-28 NOTE — ED Notes (Signed)
Critical lab-Lactic 2.6

## 2020-12-28 NOTE — ED Triage Notes (Signed)
BIB EMS for AMS. Pt was found by her daughter on the floor. She was (more) confused and was incontinent. Unknown downtime, unknown if pt hit head. No reported blood thinner. No obvious trauma, lacerations or hematomas. Pt came with c-collar but no neck tenderness per EMS.

## 2020-12-28 NOTE — ED Notes (Signed)
Phlebotomist will help in drawing blood.

## 2020-12-28 NOTE — H&P (Signed)
History and Physical    Hannah Fisher QJJ:[id number] DOB: 03/02/1951 DOA: 12/28/2020  PCP: Daisy Floro, MD  Patient coming from: Home.  History obtained from patient's daughter.  Patient appears confused.  Chief Complaint: Confusion and fall.  HPI: Hannah Fisher is a 70 y.o. female with history of hypertension, progressive weakness for which patient had followed up with Dr. Everlena Cooper neurologist had MRI done in November 2020 one of the brain which was unremarkable and as per the patient's daughter is planning to follow-up with another neurologist was found to be on the floor and confused at home this evening.  As per the patient's daughter patient was doing well 2 days ago when she spoke to over the phone.  When she went to check on her today at home she was found on the floor confused but conscious.  There is no recent change in medications.  Per patient's daughter patient does not take any pain medications or drink alcohol or use tobacco.  ED Course: In the ER patient was afebrile appeared confused and tremulous.  Labs were remarkable for creatinine increased from 0.7 in May 2021 it is around 1.2 with mildly elevated AST ALT of 142 and 66 and WBC count of 21.8 and CK levels were elevated around 5000.  UA Covid test chest x-ray were unremarkable.  CT head and C-spine did not show anything acute.  On my exam patient still appears confused.  Patient admitted for acute encephalopathy with acute renal failure cause not clear.  Review of Systems: As per HPI, rest all negative.   Past Medical History:  Diagnosis Date  . Arthritis    hands, knees  . Deafness    left ear sue to shingles  . Hypertension   . Kidney stones   . Osteoporosis 2009   -2.6  . Shingles     Past Surgical History:  Procedure Laterality Date  . MASS EXCISION Right 05/30/2018   Procedure: EXCISION MASS RIGHT SMALL FINGER;  Surgeon: Cindee Salt, MD;  Location: [facility];  Service: Orthopedics;   Laterality: Right;  . TONSILLECTOMY    . VAGINAL HYSTERECTOMY  1992     reports that she has never smoked. She has never used smokeless tobacco. She reports current alcohol use. She reports that she does not use drugs.  Allergies  Allergen Reactions  . Penicillin V Swelling  . Penicillins Hives  . Sulfa Antibiotics Rash    Family History  Problem Relation Age of Onset  . Hypertension Mother   . Stroke Mother   . Hypertension Sister   . Hypertension Brother   . Breast cancer Neg Hx     Prior to Admission medications   Medication Sig Start Date End Date Taking? Authorizing Provider  ALPRAZolam Prudy Feeler) 0.5 MG tablet Take 0.5 mg by mouth 2 (two) times daily as needed. 03/03/20   [provider]  aspirin 81 MG tablet Take 81 mg by mouth daily.   Patient not taking: Reported on 04/08/2020    [provider]  chlorthalidone (HYGROTON) 25 MG tablet Take 1 tablet (25 mg total) by mouth daily. 01/22/20 04/21/20  Quintella Reichert, MD  cholecalciferol (VITAMIN D) 1000 units tablet Take 1,000 Units by mouth daily.    [provider]  lisinopril (PRINIVIL,ZESTRIL) 2.5 MG tablet Take 2.5 mg by mouth daily.      [provider]  magnesium oxide (MAG-OX) 400 MG tablet Take 400 mg by mouth daily.  [provider]  Multiple Vitamin (MULTIVITAMIN) tablet Take 1 tablet by mouth daily.      [provider]  rOPINIRole (REQUIP) 0.25 MG tablet Take 0.2 mg by mouth as needed. 03/30/20   [provider]    Physical Exam: Constitutional: Moderately built and nourished. Vitals:   12/28/20 1734 12/28/20 1915 12/28/20 1930 12/28/20 2015  BP: (!) 134/92 123/79 132/77 126/78  Pulse: 96 (!) 102  (!) 106  Resp: 16 17 16 16   Temp:      TempSrc:      SpO2: 96% 96%  96%  Weight:      Height:       Eyes: Anicteric no pallor. ENMT: No discharge from the ears eyes nose or mouth. Neck: No mass felt.  No neck rigidity. Respiratory: No rhonchi or  crepitations. Cardiovascular: S1-S2 heard. Abdomen: Soft nontender bowel sounds present. Musculoskeletal: No edema. Skin: No rash. Neurologic: Alert awake oriented to name appears confused tremulous moving all extremities. Psychiatric: Appears confused.   Labs on Admission: I have personally reviewed following labs and imaging studies  CBC: Recent Labs  Lab 12/28/20 1631  WBC 21.8*  NEUTROABS 19.6*  HGB 14.1  HCT 43.0  MCV 93.5  PLT 350   Basic Metabolic Panel: Recent Labs  Lab 12/28/20 1631  NA 141  K 3.2*  CL 107  CO2 15*  GLUCOSE 155*  BUN 58*  CREATININE 1.27*  CALCIUM 9.5   GFR: Estimated Creatinine Clearance: 28 mL/min (A) (by C-G formula based on SCr of 1.27 mg/dL (H)). Liver Function Tests: Recent Labs  Lab 12/28/20 1631  AST 142*  ALT 66*  ALKPHOS 69  BILITOT 1.6*  PROT 6.7  ALBUMIN 4.1   No results for input(s): LIPASE, AMYLASE in the last 168 hours. No results for input(s): AMMONIA in the last 168 hours. Coagulation Profile: No results for input(s): INR, PROTIME in the last 168 hours. Cardiac Enzymes: Recent Labs  Lab 12/28/20 1631  CKTOTAL 5,980*   BNP (last 3 results) No results for input(s): PROBNP in the last 8760 hours. HbA1C: No results for input(s): HGBA1C in the last 72 hours. CBG: Recent Labs  Lab 12/28/20 1741  GLUCAP 140*   Lipid Profile: No results for input(s): CHOL, HDL, LDLCALC, TRIG, CHOLHDL, LDLDIRECT in the last 72 hours. Thyroid Function Tests: No results for input(s): TSH, T4TOTAL, FREET4, T3FREE, THYROIDAB in the last 72 hours. Anemia Panel: No results for input(s): VITAMINB12, FOLATE, FERRITIN, TIBC, IRON, RETICCTPCT in the last 72 hours. Urine analysis:    Component Value Date/Time   COLORURINE YELLOW 12/28/2020 1950   APPEARANCEUR CLEAR 12/28/2020 1950   LABSPEC 1.017 12/28/2020 1950   PHURINE 5.0 12/28/2020 1950   GLUCOSEU NEGATIVE 12/28/2020 1950   HGBUR MODERATE (A) 12/28/2020 1950   BILIRUBINUR  NEGATIVE 12/28/2020 1950   KETONESUR 80 (A) 12/28/2020 1950   PROTEINUR NEGATIVE 12/28/2020 1950   UROBILINOGEN 0.2 03/03/2014 1640   NITRITE NEGATIVE 12/28/2020 1950   LEUKOCYTESUR NEGATIVE 12/28/2020 1950   Sepsis Labs: @LABRCNTIP (procalcitonin:4,lacticidven:4) )No results found for this or any previous visit (from the past 240 hour(s)).   Radiological Exams on Admission: DG Chest 2 View  Result Date: 12/28/2020 CLINICAL DATA:  Altered mental status, fall EXAM: CHEST - 2 VIEW COMPARISON:  05/14/2020, 11/18/2020 FINDINGS: No focal opacity or pleural effusion. Cardiomediastinal silhouette within normal limits. Aortic atherosclerosis. No pneumothorax. IMPRESSION: No active cardiopulmonary disease. Electronically Signed   By: 05/16/2020 M.D.   On: 12/28/2020 17:00  CT Head Wo Contrast  Result Date: 12/28/2020 CLINICAL DATA:  Mental status change, fell, trauma EXAM: CT HEAD WITHOUT CONTRAST TECHNIQUE: Contiguous axial images were obtained from the base of the skull through the vertex without intravenous contrast. COMPARISON:  08/10/2020 FINDINGS: Brain: No acute infarct or hemorrhage. Lateral ventricles and midline structures are stable. No acute extra-axial fluid collections. No mass effect. Vascular: No hyperdense vessel or unexpected calcification. Skull: Normal. Negative for fracture or focal lesion. Sinuses/Orbits: No acute finding. Other: None. IMPRESSION: 1. No acute intracranial process. Electronically Signed   By: Sharlet Salina M.D.   On: 12/28/2020 17:29   CT Cervical Spine Wo Contrast  Result Date: 12/28/2020 CLINICAL DATA:  Larey Seat, trauma EXAM: CT CERVICAL SPINE WITHOUT CONTRAST TECHNIQUE: Multidetector CT imaging of the cervical spine was performed without intravenous contrast. Multiplanar CT image reconstructions were also generated. COMPARISON:  09/07/2020 FINDINGS: Alignment: Right convex curvature of the cervical spine. Otherwise alignment is anatomic. Skull base and vertebrae:  No acute fracture. No primary bone lesion or focal pathologic process. Soft tissues and spinal canal: No prevertebral fluid or swelling. No visible canal hematoma. Disc levels: Prominent spondylosis at C4-5 and C5-6. Mild symmetrical neural foraminal encroachment at C4-5. Upper chest: Airway is patent.  Lung apices are clear. Other: Reconstructed images demonstrate no additional findings. IMPRESSION: 1. Mild cervical spondylosis and right convex scoliosis. No acute cervical spine fracture. Electronically Signed   By: Sharlet Salina M.D.   On: 12/28/2020 17:31    EKG: Independently reviewed.  Normal sinus rhythm.  Assessment/Plan Principal Problem:   Acute encephalopathy Active Problems:   Rhabdomyolysis    1. Acute encephalopathy cause not clear patient is presently afebrile.  Will get MRI brain check ammonia levels, TSH, RPR, B12 levels, urine drug screen and EEG.  Patient does have renal failure which could be contributing. 2. Leukocytosis presently afebrile blood cultures have been ordered.  Continue to trend CBC. 3. Acute renal failure with rhabdomyolysis will continue with hydration follow CK levels.  Hold lisinopril and hydrochlorothiazide due to acute renal failure. 4. Mildly elevated LFTs likely could be from rhabdomyolysis however since patient does have significant leukocytosis will check sonogram of the abdomen to rule out any gallbladder issues.  Trend LFTs check acute hepatitis panel. 5. Hypertension we will keep patient.  IV hydralazine since patient is acute renal failure and mild hypokalemia so holding lisinopril and hydrochlorothiazide. 6. Hypokalemia replace and recheck potassium. 7. History of chronic weakness of the extremities being followed by neurologist.   DVT prophylaxis: SCDs for now.  If not planning any procedures like lumbar puncture may start patient on anticoagulation. Code Status: Full code.  Discussed with patient and daughter. Family Communication: Patient's  daughter. Disposition Plan: To be determined. Consults called: None. Admission status: Observation.   Eduard Clos MD [facility] Pager [phone].  If 7PM-7AM, please contact night-coverage [email or web address] Password [facility]  12/28/2020, 10:27 PM

## 2020-12-29 ENCOUNTER — Observation Stay (HOSPITAL_COMMUNITY): Payer: Medicare Other

## 2020-12-29 ENCOUNTER — Inpatient Hospital Stay (HOSPITAL_COMMUNITY): Payer: Medicare Other

## 2020-12-29 DIAGNOSIS — D72829 Elevated white blood cell count, unspecified: Secondary | ICD-10-CM | POA: Diagnosis present

## 2020-12-29 DIAGNOSIS — K5900 Constipation, unspecified: Secondary | ICD-10-CM | POA: Diagnosis present

## 2020-12-29 DIAGNOSIS — Z8249 Family history of ischemic heart disease and other diseases of the circulatory system: Secondary | ICD-10-CM | POA: Diagnosis not present

## 2020-12-29 DIAGNOSIS — R7989 Other specified abnormal findings of blood chemistry: Secondary | ICD-10-CM | POA: Diagnosis not present

## 2020-12-29 DIAGNOSIS — E43 Unspecified severe protein-calorie malnutrition: Secondary | ICD-10-CM | POA: Diagnosis present

## 2020-12-29 DIAGNOSIS — M25551 Pain in right hip: Secondary | ICD-10-CM | POA: Diagnosis present

## 2020-12-29 DIAGNOSIS — I10 Essential (primary) hypertension: Secondary | ICD-10-CM | POA: Diagnosis present

## 2020-12-29 DIAGNOSIS — Z681 Body mass index (BMI) 19 or less, adult: Secondary | ICD-10-CM | POA: Diagnosis not present

## 2020-12-29 DIAGNOSIS — T796XXD Traumatic ischemia of muscle, subsequent encounter: Secondary | ICD-10-CM

## 2020-12-29 DIAGNOSIS — E872 Acidosis: Secondary | ICD-10-CM | POA: Diagnosis present

## 2020-12-29 DIAGNOSIS — E876 Hypokalemia: Secondary | ICD-10-CM | POA: Diagnosis present

## 2020-12-29 DIAGNOSIS — R296 Repeated falls: Secondary | ICD-10-CM | POA: Diagnosis present

## 2020-12-29 DIAGNOSIS — R339 Retention of urine, unspecified: Secondary | ICD-10-CM | POA: Diagnosis present

## 2020-12-29 DIAGNOSIS — T796XXA Traumatic ischemia of muscle, initial encounter: Secondary | ICD-10-CM | POA: Diagnosis present

## 2020-12-29 DIAGNOSIS — N179 Acute kidney failure, unspecified: Secondary | ICD-10-CM | POA: Diagnosis present

## 2020-12-29 DIAGNOSIS — M25552 Pain in left hip: Secondary | ICD-10-CM | POA: Diagnosis present

## 2020-12-29 DIAGNOSIS — S0003XA Contusion of scalp, initial encounter: Secondary | ICD-10-CM | POA: Diagnosis not present

## 2020-12-29 DIAGNOSIS — Z0389 Encounter for observation for other suspected diseases and conditions ruled out: Secondary | ICD-10-CM | POA: Diagnosis not present

## 2020-12-29 DIAGNOSIS — Z20822 Contact with and (suspected) exposure to covid-19: Secondary | ICD-10-CM | POA: Diagnosis present

## 2020-12-29 DIAGNOSIS — Y92009 Unspecified place in unspecified non-institutional (private) residence as the place of occurrence of the external cause: Secondary | ICD-10-CM | POA: Diagnosis not present

## 2020-12-29 DIAGNOSIS — Z87442 Personal history of urinary calculi: Secondary | ICD-10-CM | POA: Diagnosis not present

## 2020-12-29 DIAGNOSIS — E860 Dehydration: Secondary | ICD-10-CM | POA: Diagnosis present

## 2020-12-29 DIAGNOSIS — W19XXXA Unspecified fall, initial encounter: Secondary | ICD-10-CM | POA: Diagnosis present

## 2020-12-29 DIAGNOSIS — Z882 Allergy status to sulfonamides status: Secondary | ICD-10-CM | POA: Diagnosis not present

## 2020-12-29 DIAGNOSIS — H9192 Unspecified hearing loss, left ear: Secondary | ICD-10-CM | POA: Diagnosis present

## 2020-12-29 DIAGNOSIS — G9340 Encephalopathy, unspecified: Secondary | ICD-10-CM

## 2020-12-29 DIAGNOSIS — Z880 Allergy status to penicillin: Secondary | ICD-10-CM | POA: Diagnosis not present

## 2020-12-29 DIAGNOSIS — Z90710 Acquired absence of both cervix and uterus: Secondary | ICD-10-CM | POA: Diagnosis not present

## 2020-12-29 DIAGNOSIS — W19XXXD Unspecified fall, subsequent encounter: Secondary | ICD-10-CM | POA: Diagnosis not present

## 2020-12-29 DIAGNOSIS — G9349 Other encephalopathy: Secondary | ICD-10-CM | POA: Diagnosis present

## 2020-12-29 LAB — COMPREHENSIVE METABOLIC PANEL
ALT: 74 U/L — ABNORMAL HIGH (ref 0–44)
AST: 171 U/L — ABNORMAL HIGH (ref 15–41)
Albumin: 3.3 g/dL — ABNORMAL LOW (ref 3.5–5.0)
Alkaline Phosphatase: 59 U/L (ref 38–126)
Anion gap: 13 (ref 5–15)
BUN: 52 mg/dL — ABNORMAL HIGH (ref 8–23)
CO2: 19 mmol/L — ABNORMAL LOW (ref 22–32)
Calcium: 9.4 mg/dL (ref 8.9–10.3)
Chloride: 110 mmol/L (ref 98–111)
Creatinine, Ser: 1.11 mg/dL — ABNORMAL HIGH (ref 0.44–1.00)
GFR, Estimated: 53 mL/min — ABNORMAL LOW (ref >60)
Glucose, Bld: 127 mg/dL — ABNORMAL HIGH (ref 70–99)
Potassium: 3.3 mmol/L — ABNORMAL LOW (ref 3.5–5.1)
Sodium: 142 mmol/L (ref 135–145)
Total Bilirubin: 1.5 mg/dL — ABNORMAL HIGH (ref 0.3–1.2)
Total Protein: 5.5 g/dL — ABNORMAL LOW (ref 6.5–8.1)

## 2020-12-29 LAB — HEPATITIS PANEL, ACUTE
HCV Ab: NONREACTIVE
Hep A IgM: NONREACTIVE
Hep B C IgM: NONREACTIVE
Hepatitis B Surface Ag: NONREACTIVE

## 2020-12-29 LAB — CBC
HCT: 37.4 % (ref 36.0–46.0)
Hemoglobin: 12.5 g/dL (ref 12.0–15.0)
MCH: 30.8 pg (ref 26.0–34.0)
MCHC: 33.4 g/dL (ref 30.0–36.0)
MCV: 92.1 fL (ref 80.0–100.0)
Platelets: 281 10*3/uL (ref 150–400)
RBC: 4.06 MIL/uL (ref 3.87–5.11)
RDW: 12.5 % (ref 11.5–15.5)
WBC: 19.5 10*3/uL — ABNORMAL HIGH (ref 4.0–10.5)
nRBC: 0 % (ref 0.0–0.2)

## 2020-12-29 LAB — AMMONIA: Ammonia: 32 umol/L (ref 9–35)

## 2020-12-29 LAB — CK: Total CK: 6268 U/L — ABNORMAL HIGH (ref 38–234)

## 2020-12-29 LAB — HIV ANTIBODY (ROUTINE TESTING W REFLEX): HIV Screen 4th Generation wRfx: NONREACTIVE

## 2020-12-29 LAB — LACTIC ACID, PLASMA: Lactic Acid, Venous: 1.2 mmol/L (ref 0.5–1.9)

## 2020-12-29 LAB — RPR: RPR Ser Ql: NONREACTIVE

## 2020-12-29 MED ORDER — HEPARIN SODIUM (PORCINE) 5000 UNIT/ML IJ SOLN
5000.0000 [IU] | Freq: Three times a day (TID) | INTRAMUSCULAR | Status: DC
  Administered 2020-12-29 – 2021-01-04 (×18): 5000 [IU] via SUBCUTANEOUS
  Filled 2020-12-29 (×19): qty 1

## 2020-12-29 MED ORDER — SODIUM BICARBONATE 8.4 % IV SOLN
INTRAVENOUS | Status: DC
  Filled 2020-12-29 (×4): qty 830

## 2020-12-29 MED ORDER — SODIUM BICARBONATE 8.4 % IV SOLN
INTRAVENOUS | Status: DC
  Filled 2020-12-29: qty 850

## 2020-12-29 MED ORDER — SODIUM BICARBONATE 8.4 % IV SOLN: INTRAVENOUS | Status: DC

## 2020-12-29 MED ORDER — HYDRALAZINE HCL 20 MG/ML IJ SOLN: 10.0000 mg | INTRAMUSCULAR | Status: DC | PRN

## 2020-12-29 MED ORDER — POTASSIUM CHLORIDE CRYS ER 20 MEQ PO TBCR
20.0000 meq | EXTENDED_RELEASE_TABLET | Freq: Once | ORAL | Status: DC
  Filled 2020-12-29: qty 1

## 2020-12-29 NOTE — ED Notes (Signed)
Patient transported to MRI 

## 2020-12-29 NOTE — Progress Notes (Signed)
Patient has had several urine incontinence episodes throughout the day. Although her pubis seemed to be distended. Bladder scanned showed >900. MD paged. Order to I&O cath x 1. Urine output 1100, no complications. Will continue to monitor.

## 2020-12-29 NOTE — Progress Notes (Signed)
PROGRESS NOTE    Hannah Fisher  JJK:[id number] DOB: July 07, 1951 DOA: 12/28/2020 PCP: Daisy Floro, MD    Brief Narrative:  70 year old female with a history of hypertension, admitted to the hospital after she was found laying on the floor for unknown period of time.  She was noted to be somnolent and confused.  Work-up including MRI imaging, metabolic work-up has been unrevealing.  She was noted to have rhabdomyolysis and acute kidney injury.  She is receiving IV fluids.  Holding sedating medications.  Continue to follow mental status.   Assessment & Plan:   Principal Problem:   Acute encephalopathy Active Problems:   Rhabdomyolysis   Acute encephalopathy -etiology is unclear, may possibly be related to renal failure/dehydration -Daughter feels it is unlikely that patient took excess medications -MRI brain does not show any acute intracranial abnormality -Urinalysis did not show any signs of infection -TSH, ammonia, B12 are unremarkable -EEG done with report currently pending -Daughter feels that there may have been some mild improvement in mental status since admission, although not back to baseline as of yet -Continue to monitor  Acute kidney injury -Suspect related to dehydration in the setting of ACE inhibitor/thiazide use as well as rhabdomyolysis -Continue IV hydration monitor urine output  Leukocytosis -Possibly related to hemoconcentration -No obvious sources of infection at this time -Continue to monitor  Hypertension -Holding ACE inhibitor as well as thiazide due to renal failure, blood pressure currently stable  Acute traumatic rhabdomyolysis -Secondary to fall -CK levels currently trending up -We will change IV fluids to bicarbonate infusion to alkalize the urine -Monitor urine output  Fall -Daughter reports that patient has been falling more frequently -She does have some pain in her hips bilaterally, will check x-ray -Physical therapy  evaluation  Elevated LFTs -Imaging of gallbladder unrevealing -Suspect this is related to rhabdomyolysis -Hepatitis panel negative -No abdominal tenderness on exam  Hypokalemia -Replace -Check magnesium  History of chronic weakness of upper and lower extremities -B12 is normal -She is followed by an outpatient neurologist   DVT prophylaxis: SCDs Start: 12/28/20 2226  Heparin  5000 units TID  Code Status: full code Family Communication: discussed with daughter at the bedside Disposition Plan: Status is: Observation  The patient will require care spanning > 2 midnights and should be moved to inpatient because: Altered mental status  Dispo: The patient is from: Home              Anticipated d/c is to: TBD              Patient currently is not medically stable to d/c.   Difficult to place patient No  Consultants:     Procedures:     Antimicrobials:       Subjective: Somnolent, wakes up to voice, knows she is in the hospital, still confused when answering some questions  Objective: Vitals:   12/29/20 0745 12/29/20 0800 12/29/20 0832 12/29/20 0900  BP: 123/77 123/80 127/82 134/80  Pulse: 99 (!) 105 99 99  Resp: 18 17 17 17   Temp:   98.9 F (37.2 C) 98.4 F (36.9 C)  TempSrc:   Oral Axillary  SpO2: 100% 100% 100%   Weight:      Height:        Intake/Output Summary (Last 24 hours) at 12/29/2020 1527 Last data filed at 12/29/2020 1300 Gross per 24 hour  Intake 1100 ml  Output 750 ml  Net 350 ml   Filed Weights   12/28/20 1612  Weight: 43.1 kg    Examination:  General exam: Appears calm and comfortable  Respiratory system: Clear to auscultation. Respiratory effort normal. Cardiovascular system: S1 & S2 heard, RRR. No JVD, murmurs, rubs, gallops or clicks. No pedal edema. Gastrointestinal system: Abdomen is nondistended, soft and nontender. No organomegaly or masses felt. Normal bowel sounds heard. Central nervous system: No focal neurological  deficits. Extremities: Symmetric 5 x 5 power. Skin: erythema and blistering over left hip Psychiatry: Orientedx3, somnolent, still has some confusion    Data Reviewed: I have personally reviewed following labs and imaging studies  CBC: Recent Labs  Lab 12/28/20 1631 12/29/20 0311  WBC 21.8* 19.5*  NEUTROABS 19.6*  --   HGB 14.1 12.5  HCT 43.0 37.4  MCV 93.5 92.1  PLT 350 281   Basic Metabolic Panel: Recent Labs  Lab 12/28/20 1631 12/29/20 0311  NA 141 142  K 3.2* 3.3*  CL 107 110  CO2 15* 19*  GLUCOSE 155* 127*  BUN 58* 52*  CREATININE 1.27* 1.11*  CALCIUM 9.5 9.4   GFR: Estimated Creatinine Clearance: 32.1 mL/min (A) (by C-G formula based on SCr of 1.11 mg/dL (H)). Liver Function Tests: Recent Labs  Lab 12/28/20 1631 12/29/20 0311  AST 142* 171*  ALT 66* 74*  ALKPHOS 69 59  BILITOT 1.6* 1.5*  PROT 6.7 5.5*  ALBUMIN 4.1 3.3*   No results for input(s): LIPASE, AMYLASE in the last 168 hours. Recent Labs  Lab 12/29/20 0311  AMMONIA 32   Coagulation Profile: No results for input(s): INR, PROTIME in the last 168 hours. Cardiac Enzymes: Recent Labs  Lab 12/28/20 1631 12/29/20 0311  CKTOTAL 5,980* 6,268*   BNP (last 3 results) No results for input(s): PROBNP in the last 8760 hours. HbA1C: No results for input(s): HGBA1C in the last 72 hours. CBG: Recent Labs  Lab 12/28/20 1741  GLUCAP 140*   Lipid Profile: No results for input(s): CHOL, HDL, LDLCALC, TRIG, CHOLHDL, LDLDIRECT in the last 72 hours. Thyroid Function Tests: Recent Labs    12/28/20 2140  TSH 0.436   Anemia Panel: Recent Labs    12/28/20 2140  VITAMINB12 644   Sepsis Labs: Recent Labs  Lab 12/28/20 2140 12/28/20 2352  LATICACIDVEN 2.6* 1.2    Recent Results (from the past 240 hour(s))  Culture, blood (routine x 2)     Status: None (Preliminary result)   Collection Time: 12/28/20  9:40 PM   Specimen: BLOOD  Result Value Ref Range Status   Specimen Description  BLOOD SITE NOT SPECIFIED  Final   Special Requests   Final    BOTTLES DRAWN AEROBIC AND ANAEROBIC Blood Culture adequate volume   Culture   Final    NO GROWTH < 12 HOURS Performed at [facility], [address] [place], [place] [address]    Report Status PENDING  Incomplete  Culture, blood (routine x 2)     Status: None (Preliminary result)   Collection Time: 12/28/20  9:40 PM   Specimen: BLOOD  Result Value Ref Range Status   Specimen Description BLOOD SITE NOT SPECIFIED  Final   Special Requests   Final    BOTTLES DRAWN AEROBIC AND ANAEROBIC Blood Culture adequate volume   Culture   Final    NO GROWTH < 12 HOURS Performed at [facility], [address] [place], [place] [address]    Report Status PENDING  Incomplete  Resp Panel by RT-PCR (Flu A&B, Covid) Nasopharyngeal Swab  Status: None   Collection Time: 12/28/20 10:10 PM   Specimen: Nasopharyngeal Swab; Nasopharyngeal(NP) swabs in vial transport medium  Result Value Ref Range Status   SARS Coronavirus 2 by RT PCR NEGATIVE NEGATIVE Final    Comment: (NOTE) SARS-CoV-2 target nucleic acids are NOT DETECTED.  The SARS-CoV-2 RNA is generally detectable in upper respiratory specimens during the acute phase of infection. The lowest concentration of SARS-CoV-2 viral copies this assay can detect is 138 copies/mL. A negative result does not preclude SARS-Cov-2 infection and should not be used as the sole basis for treatment or other patient management decisions. A negative result may occur with  improper specimen collection/handling, submission of specimen other than nasopharyngeal swab, presence of viral mutation(s) within the areas targeted by this assay, and inadequate number of viral copies(<138 copies/mL). A negative result must be combined with clinical observations, patient history, and epidemiological information. The expected result is Negative.  Fact Sheet for Patients:   BloggerCourse.com  Fact Sheet for Healthcare Providers:  SeriousBroker.it  This test is no t yet approved or cleared by the [place] FDA and  has been authorized for detection and/or diagnosis of SARS-CoV-2 by FDA under an Emergency Use Authorization (EUA). This EUA will remain  in effect (meaning this test can be used) for the duration of the COVID-19 declaration under Section 564(b)(1) of the Act, 21 U.S.C.section 360bbb-3(b)(1), unless the authorization is terminated  or revoked sooner.       Influenza A by PCR NEGATIVE NEGATIVE Final   Influenza B by PCR NEGATIVE NEGATIVE Final    Comment: (NOTE) The Xpert Xpress SARS-CoV-2/FLU/RSV plus assay is intended as an aid in the diagnosis of influenza from Nasopharyngeal swab specimens and should not be used as a sole basis for treatment. Nasal washings and aspirates are unacceptable for Xpert Xpress SARS-CoV-2/FLU/RSV testing.  Fact Sheet for Patients: BloggerCourse.com  Fact Sheet for Healthcare Providers: SeriousBroker.it  This test is not yet approved or cleared by the [place] FDA and has been authorized for detection and/or diagnosis of SARS-CoV-2 by FDA under an Emergency Use Authorization (EUA). This EUA will remain in effect (meaning this test can be used) for the duration of the COVID-19 declaration under Section 564(b)(1) of the Act, 21 U.S.C. section 360bbb-3(b)(1), unless the authorization is terminated or revoked.  Performed at [facility], [address], [place], [place] [address]          Radiology Studies: DG Chest 2 View  Result Date: 12/28/2020 CLINICAL DATA:  Altered mental status, fall EXAM: CHEST - 2 VIEW COMPARISON:  05/14/2020, 11/18/2020 FINDINGS: No focal opacity or pleural effusion. Cardiomediastinal silhouette within normal limits. Aortic atherosclerosis. No pneumothorax.  IMPRESSION: No active cardiopulmonary disease. Electronically Signed   By: Jasmine Pang M.D.   On: 12/28/2020 17:00   DG Abd 1 View  Result Date: 12/29/2020 CLINICAL DATA:  Possible foreign body EXAM: ABDOMEN - 1 VIEW COMPARISON:  None. FINDINGS: Scattered large and small bowel gas is noted. Considerable retained fecal material is noted consistent with constipation. No free air is seen. No radiopaque foreign body is noted. IMPRESSION: Changes consistent with colonic constipation. No other focal abnormality is noted. Electronically Signed   By: Alcide Clever M.D.   On: 12/29/2020 02:55   CT Head Wo Contrast  Result Date: 12/28/2020 CLINICAL DATA:  Mental status change, fell, trauma EXAM: CT HEAD WITHOUT CONTRAST TECHNIQUE: Contiguous axial images were obtained from the base of the skull through the vertex without intravenous  contrast. COMPARISON:  08/10/2020 FINDINGS: Brain: No acute infarct or hemorrhage. Lateral ventricles and midline structures are stable. No acute extra-axial fluid collections. No mass effect. Vascular: No hyperdense vessel or unexpected calcification. Skull: Normal. Negative for fracture or focal lesion. Sinuses/Orbits: No acute finding. Other: None. IMPRESSION: 1. No acute intracranial process. Electronically Signed   By: Sharlet Salina M.D.   On: 12/28/2020 17:29   CT Cervical Spine Wo Contrast  Result Date: 12/28/2020 CLINICAL DATA:  Larey Seat, trauma EXAM: CT CERVICAL SPINE WITHOUT CONTRAST TECHNIQUE: Multidetector CT imaging of the cervical spine was performed without intravenous contrast. Multiplanar CT image reconstructions were also generated. COMPARISON:  09/07/2020 FINDINGS: Alignment: Right convex curvature of the cervical spine. Otherwise alignment is anatomic. Skull base and vertebrae: No acute fracture. No primary bone lesion or focal pathologic process. Soft tissues and spinal canal: No prevertebral fluid or swelling. No visible canal hematoma. Disc levels: Prominent  spondylosis at C4-5 and C5-6. Mild symmetrical neural foraminal encroachment at C4-5. Upper chest: Airway is patent.  Lung apices are clear. Other: Reconstructed images demonstrate no additional findings. IMPRESSION: 1. Mild cervical spondylosis and right convex scoliosis. No acute cervical spine fracture. Electronically Signed   By: Sharlet Salina M.D.   On: 12/28/2020 17:31   MR BRAIN WO CONTRAST  Result Date: 12/29/2020 CLINICAL DATA:  70 year old female with confusion, altered mental status. Weakness and tremors. EXAM: MRI HEAD WITHOUT CONTRAST TECHNIQUE: Multiplanar, multiecho pulse sequences of the brain and surrounding structures were obtained without intravenous contrast. COMPARISON:  Head CT 12/28/2020.  Brain MRI 08/10/2020. FINDINGS: Brain: Some T2 shine through is noted in the left parietal lobe white matter on series 9, image 33. No convincing restricted diffusion to suggest acute infarction. No midline shift, mass effect, evidence of mass lesion, ventriculomegaly, extra-axial collection or acute intracranial hemorrhage. Cervicomedullary junction and pituitary are within normal limits. Generally mild for age nonspecific white matter T2 and FLAIR hyperintensity, which is most pronounced in the left parietal lobe. No chronic cerebral blood products. No cortical encephalomalacia identified. Deep gray matter nuclei, brainstem and cerebellum are within normal limits for age. Vascular: Major intracranial vascular flow voids are stable since November with generalized intracranial artery tortuosity. Skull and upper cervical spine: Stable and negative for age visible cervical spine. Normal visible bone marrow signal. Sinuses/Orbits: Stable, negative. Other: Mastoids remain clear. Grossly stable and normal visible internal auditory structures. Superior vertex and left posterior convexity scalp hematoma best demonstrated on series 7, images 93, 85. IMPRESSION: 1. No acute intracranial abnormality. Stable  noncontrast MRI appearance of the brain since November with mild for age white matter changes, most commonly due to small vessel disease. 2. Superior and left posterior convexity scalp hematoma. Electronically Signed   By: Odessa Fleming M.D.   On: 12/29/2020 05:09   US Abdomen Limited RUQ (LIVER/GB)  Result Date: 12/29/2020 CLINICAL DATA:  Elevated liver function tests EXAM: ULTRASOUND ABDOMEN LIMITED RIGHT UPPER QUADRANT COMPARISON:  None. FINDINGS: Gallbladder: No gallstones or wall thickening visualized. No sonographic Murphy sign noted by sonographer. Common bile duct: Diameter: 4 mm in proximal diameter Liver: No focal lesion identified. Within normal limits in parenchymal echogenicity. Portal vein is patent on color Doppler imaging with normal direction of blood flow towards the liver. Other: None. IMPRESSION: Normal right upper quadrant sonogram. Electronically Signed   By: Helyn Numbers MD   On: 12/29/2020 06:29        Scheduled Meds: . potassium chloride  20 mEq Oral Once   Continuous  Infusions: . dextrose 5 % 1,000 mL with potassium chloride 40 mEq, sodium bicarbonate 150 mEq infusion       LOS: 0 days    Time spent:    Erick Blinks, MD [facility]   If 7PM-7AM, please contact night-coverage [email or web address]  12/29/2020, 3:27 PM

## 2020-12-29 NOTE — Progress Notes (Signed)
EEG completed, results pending. 

## 2020-12-29 NOTE — ED Notes (Signed)
Patient transported to Ultrasound 

## 2020-12-29 NOTE — ED Notes (Signed)
Report given to Merwyn Katos, RN of [phone]

## 2020-12-29 NOTE — Procedures (Signed)
  EEG Report Indication: possible seizure activity  This study was recorded in the waiting and drowsy state.  The duration of the study was 26 minutes.  Electrodes were placed according to the International 10/20 system.  Video was reviewed/available for clinical correlation as needed.  In the waking state, discernible but diminished background organization is seen with a mildly attenuated anterior - posterior voltage and frequency gradient.  In the occipital leads there was a symmetric and reactive although indistinct and poorly sustained posterior dominant rhythm of approximately 7 hertz, which is slower than expected for age  Anteriorly, is the expected pattern of faster frequency, lower voltage waveforms, although in a reduced amount as compared to normal.  During the drowsy state, there is further attenuation of the gradient, a general shift to slower frequencies diffusely, a waxing and waning of the posterior dominant rhythm, and slow roving eye movements.  There were intermittently typically isolated Spike waveforms seen in the left temporal region, and very occasionally the right. In addition, there is some degree of slowing in the bitemporal regions, although to a very modest degree.  On occasion the spike waves have an aftergoing slow wave (e.g.,  at 11:37:01 of the recording)).  These are strongly favored to be epileptiform discharges.  Hyperventilation: deferred Photic stimulation: deferred  Impression:  This is an abnormal waiting and drowsy study due to 1) left temporal (and possibly very occasionally right temporal) slowing and definitively epileptiform discharges (with an aftergoing slow wave distinguishing from wickets).  This finding would be suggestive of an epileptiform foci in the left temporal area (possibly relatively deep with occasional representation in the contralateral hemisphere on surface EEG).    This is superimposed upon diminished background organization as  described above, most clinically compatible with a mild diffuse encephalopathy.    There were no seizures however.

## 2020-12-30 ENCOUNTER — Inpatient Hospital Stay (HOSPITAL_COMMUNITY): Payer: Medicare Other

## 2020-12-30 DIAGNOSIS — E872 Acidosis: Secondary | ICD-10-CM | POA: Diagnosis not present

## 2020-12-30 DIAGNOSIS — R7989 Other specified abnormal findings of blood chemistry: Secondary | ICD-10-CM | POA: Diagnosis not present

## 2020-12-30 DIAGNOSIS — N179 Acute kidney failure, unspecified: Secondary | ICD-10-CM | POA: Diagnosis not present

## 2020-12-30 DIAGNOSIS — G9340 Encephalopathy, unspecified: Secondary | ICD-10-CM | POA: Diagnosis not present

## 2020-12-30 LAB — CBC WITH DIFFERENTIAL/PLATELET
Abs Immature Granulocytes: 0.07 10*3/uL (ref 0.00–0.07)
Basophils Absolute: 0 10*3/uL (ref 0.0–0.1)
Basophils Relative: 0 %
Eosinophils Absolute: 0 10*3/uL (ref 0.0–0.5)
Eosinophils Relative: 0 %
HCT: 34.1 % — ABNORMAL LOW (ref 36.0–46.0)
Hemoglobin: 11.4 g/dL — ABNORMAL LOW (ref 12.0–15.0)
Immature Granulocytes: 1 %
Lymphocytes Relative: 4 %
Lymphs Abs: 0.5 10*3/uL — ABNORMAL LOW (ref 0.7–4.0)
MCH: 30.7 pg (ref 26.0–34.0)
MCHC: 33.4 g/dL (ref 30.0–36.0)
MCV: 91.9 fL (ref 80.0–100.0)
Monocytes Absolute: 0.9 10*3/uL (ref 0.1–1.0)
Monocytes Relative: 7 %
Neutro Abs: 11.7 10*3/uL — ABNORMAL HIGH (ref 1.7–7.7)
Neutrophils Relative %: 88 %
Platelets: 227 10*3/uL (ref 150–400)
RBC: 3.71 MIL/uL — ABNORMAL LOW (ref 3.87–5.11)
RDW: 12.8 % (ref 11.5–15.5)
WBC: 13.2 10*3/uL — ABNORMAL HIGH (ref 4.0–10.5)
nRBC: 0 % (ref 0.0–0.2)

## 2020-12-30 LAB — RENAL FUNCTION PANEL
Albumin: 2.8 g/dL — ABNORMAL LOW (ref 3.5–5.0)
Anion gap: 6 (ref 5–15)
BUN: 35 mg/dL — ABNORMAL HIGH (ref 8–23)
CO2: 32 mmol/L (ref 22–32)
Calcium: 9.1 mg/dL (ref 8.9–10.3)
Chloride: 108 mmol/L (ref 98–111)
Creatinine, Ser: 0.79 mg/dL (ref 0.44–1.00)
GFR, Estimated: >60 mL/min (ref >60)
Glucose, Bld: 172 mg/dL — ABNORMAL HIGH (ref 70–99)
Phosphorus: 1.1 mg/dL — ABNORMAL LOW (ref 2.5–4.6)
Potassium: 3.6 mmol/L (ref 3.5–5.1)
Sodium: 146 mmol/L — ABNORMAL HIGH (ref 135–145)

## 2020-12-30 LAB — PHOSPHORUS: Phosphorus: 2.8 mg/dL (ref 2.5–4.6)

## 2020-12-30 LAB — MAGNESIUM: Magnesium: 2.6 mg/dL — ABNORMAL HIGH (ref 1.7–2.4)

## 2020-12-30 LAB — CK: Total CK: 3843 U/L — ABNORMAL HIGH (ref 38–234)

## 2020-12-30 MED ORDER — POTASSIUM PHOSPHATES 15 MMOLE/5ML IV SOLN
45.0000 mmol | Freq: Once | INTRAVENOUS | Status: AC
  Administered 2020-12-30: 45 mmol via INTRAVENOUS
  Filled 2020-12-30: qty 15

## 2020-12-30 MED ORDER — LACTATED RINGERS IV SOLN: INTRAVENOUS | Status: DC

## 2020-12-30 MED ORDER — CHLORHEXIDINE GLUCONATE CLOTH 2 % EX PADS
6.0000 | MEDICATED_PAD | Freq: Every day | CUTANEOUS | Status: DC
  Administered 2020-12-30 – 2021-01-04 (×6): 6 via TOPICAL

## 2020-12-30 MED ORDER — LEVETIRACETAM 500 MG PO TABS
500.0000 mg | ORAL_TABLET | Freq: Two times a day (BID) | ORAL | Status: DC
  Administered 2020-12-30 – 2021-01-04 (×10): 500 mg via ORAL
  Filled 2020-12-30 (×11): qty 1

## 2020-12-30 MED ORDER — TAMSULOSIN HCL 0.4 MG PO CAPS
0.4000 mg | ORAL_CAPSULE | Freq: Every day | ORAL | Status: DC
  Administered 2020-12-30 – 2021-01-03 (×5): 0.4 mg via ORAL
  Filled 2020-12-30 (×5): qty 1

## 2020-12-30 MED ORDER — LEVETIRACETAM IN NACL 500 MG/100ML IV SOLN
500.0000 mg | Freq: Once | INTRAVENOUS | Status: AC
  Administered 2020-12-30: 500 mg via INTRAVENOUS
  Filled 2020-12-30: qty 100

## 2020-12-30 NOTE — Progress Notes (Signed)
Pt orientation improved to self and place at first assessment. Later in shift pt orientation also included month and year. Pt consistently confused. Making requests and speaking about things that are not reflective of her current situation.

## 2020-12-30 NOTE — Evaluation (Signed)
Physical Therapy Evaluation Patient Details Name: Hannah Fisher MRN: [id number] DOB: [birth year] Today's Date: 12/30/2020   History of Present Illness  70 y.o. female with progressive weakness had MRI done in November 2020 one of the brain which was unremarkable, Daughter found her on floor and confused 3/29 and brought her to ED. Last known well 3/27. In ED found to be confused and tremulous, labs remarkable for elevated creatine and CK levels MRI unremarkable. EEG suggestive of epileptiform foci in the left temporal area Admitted for acute encephalopathy, and rhabdomyolysis PMH: hypertension, OA, osteoporosis  Clinical Impression  Pt continues to be confused with nonsensical answers to questions about home set up and PLOF. Pt's daughters [name] and Victorino Dike able to clarify answers. Pt was living alone in single story townhouse with 1 step to enter.Family and neighbors checked in periodically. Pt was independent with household ambulation with Rollator or furniture support and was able to do her own bathing and dressing. Pt's family brings groceries, take care of medication and bring in mail.  Pt's daughter reports increasing tone in LE since last June, and last November she stopped feeling comfortable taking her to store given pt's instability. Pt is motivated to get up but currently requires max A for bed mobility and total A for squat pivot transfer <> BSC. PT recommends CIR level rehab at discharge given pt's desire to improve her mobility and increased support from daughters. PT will continue to follow acutely.     Follow Up Recommendations CIR    Equipment Recommendations  3in1 (PT);Wheelchair (measurements PT);Wheelchair cushion (measurements PT)    Recommendations for Other Services Rehab consult;OT consult     Precautions / Restrictions Precautions Precautions: Fall Restrictions Weight Bearing Restrictions: No      Mobility  Bed Mobility Overal bed mobility: Needs Assistance Bed  Mobility: Supine to Sit;Sit to Supine     Supine to sit: Max assist;HOB elevated Sit to supine: Max assist   General bed mobility comments: pt able to move LE to EoB, pt attempted to pull against therapist to come to seated but unable to achieve, requires max A at shoulders to bring to upright    Transfers Overall transfer level: Needs assistance   Transfers: Squat Pivot Transfers     Squat pivot transfers: Total assist     General transfer comment: Total A for transfer to and from [facility]  Ambulation/Gait             General Gait Details: unable to attempt         Balance Overall balance assessment: Needs assistance Sitting-balance support: Feet supported;Bilateral upper extremity supported Sitting balance-Leahy Scale: Poor Sitting balance - Comments: requires posterioral support to maintain balance Postural control: Posterior lean Standing balance support: During functional activity Standing balance-Leahy Scale: Zero                               Pertinent Vitals/Pain Pain Assessment: Faces Faces Pain Scale: Hurts even more Pain Location: LE with stretching and weightbearing Pain Descriptors / Indicators: Grimacing;Guarding Pain Intervention(s): Monitored during session;Repositioned    Home Living Family/patient expects to be discharged to:: Private residence Living Arrangements: Alone Available Help at Discharge: Family Type of Home: House Home Access: Stairs to enter (stoop)   Secretary/administrator of Steps: 1 Home Layout: One level Home Equipment: Hand held shower head Additional Comments: Pt with nonsensical answers to questions about home set up, daughters had to clarify much  of the information    Prior Function Level of Independence: Independent with assistive device(s);Needs assistance   Gait / Transfers Assistance Needed: used Rollator for ambulation or in home used furniture for support, pt's daughter reports her gait has been  getting progressively worse since the sudden onset of confusion in June  ADL's / Homemaking Assistance Needed: reports independence in self care but admits difficulty getting in and out of shower, daughter stopped taking her shopping about 4 months ago, able to fix premade meals, no longer cooks,  Comments: Daughter [name] filled in gaps in information        Extremity/Trunk Assessment   Upper Extremity Assessment Upper Extremity Assessment: Defer to OT evaluation    Lower Extremity Assessment Lower Extremity Assessment: RLE deficits/detail;LLE deficits/detail RLE Deficits / Details: Pt with increase LE tone especially in knees, requires gentle prolonged stretch to decrease tone enough to straighten legs, unable to achieve full extension, strength 2/5 or less not able to come to standing RLE Sensation: decreased light touch RLE Coordination: decreased fine motor;decreased gross motor LLE Deficits / Details: Pt with increase LE tone especially in knees, requires gentle prolonged stretch to decrease tone enough to straighten legs, unable to achieve full extension, strength 2/5 or less not able to come to standing LLE Sensation: decreased light touch LLE Coordination: decreased fine motor;decreased gross motor    Cervical / Trunk Assessment Cervical / Trunk Assessment: Other exceptions Cervical / Trunk Exceptions: increased truncal tone requires increase time and gentle assist to achieve upright  Communication   Communication: No difficulties  Cognition Arousal/Alertness: Awake/alert Behavior During Therapy: WFL for tasks assessed/performed Overall Cognitive Status: Impaired/Different from baseline Area of Impairment: Attention;Awareness;Problem solving;Safety/judgement;Following commands;Memory                   Current Attention Level: Selective Memory: Decreased short-term memory Following Commands: Follows one step commands with increased time;Follows multi-step commands  inconsistently;Follows multi-step commands with increased time Safety/Judgement: Decreased awareness of deficits Awareness: Emergent Problem Solving: Slow processing;Decreased initiation;Difficulty sequencing;Requires verbal cues;Requires tactile cues General Comments: Daughters in room related that pt has increase confusion,      General Comments General comments (skin integrity, edema, etc.): Pt's daughters [name] and Victorino Dike present during session, helpful in providing accurate PLOF and home set up, VSS on RA        Assessment/Plan    PT Assessment Patient needs continued PT services  PT Problem List Decreased strength;Decreased range of motion;Decreased activity tolerance;Decreased balance;Decreased mobility;Decreased coordination;Decreased cognition;Decreased safety awareness;Impaired sensation;Pain       PT Treatment Interventions DME instruction;Gait training;Functional mobility training;Therapeutic activities;Therapeutic exercise;Balance training;Neuromuscular re-education;Cognitive remediation;Patient/family education    PT Goals (Current goals can be found in the Care Plan section)  Acute Rehab PT Goals Patient Stated Goal: walk PT Goal Formulation: With patient/family Time For Goal Achievement: 01/13/21 Potential to Achieve Goals: Fair    Frequency Min 3X/week    AM-PAC PT "6 Clicks" Mobility  Outcome Measure Help needed turning from your back to your side while in a flat bed without using bedrails?: A Little Help needed moving from lying on your back to sitting on the side of a flat bed without using bedrails?: A Lot Help needed moving to and from a bed to a chair (including a wheelchair)?: Total Help needed standing up from a chair using your arms (e.g., wheelchair or bedside chair)?: Total Help needed to walk in hospital room?: Total Help needed climbing 3-5 steps with a railing? : Total 6 Click  Score: 9    End of Session Equipment Utilized During Treatment:  Gait belt Activity Tolerance: Patient tolerated treatment well Patient left: in bed;with call bell/phone within reach;with bed alarm set;with family/visitor present Nurse Communication: Mobility status (Assist for pericare before return to bed) PT Visit Diagnosis: Unsteadiness on feet (R26.81);Other abnormalities of gait and mobility (R26.89);Repeated falls (R29.6);Muscle weakness (generalized) (M62.81);History of falling (Z91.81);Difficulty in walking, not elsewhere classified (R26.2);Other symptoms and signs involving the nervous system (R29.898);Pain Pain - Right/Left:  (bilateral) Pain - part of body: Leg    Time: 2841-3244 PT Time Calculation (min) (ACUTE ONLY): 35 min   Charges:   PT Evaluation $PT Eval Moderate Complexity: 1 Mod PT Treatments $Therapeutic Activity: 8-22 mins        Keelen Quevedo B. Beverely Risen PT, DPT Acute Rehabilitation Services Pager [phone] Office [phone]   Elon Alas Fleet 12/30/2020, 1:36 PM

## 2020-12-30 NOTE — Progress Notes (Signed)
Inpatient Rehab Admissions Coordinator Note:   Per therapy recommendations, pt was screened for CIR candidacy by Estill Dooms, PT, DPT.  At this time pt does not have a clear diagnosis for rehab.  We will follow from a distance until diagnosis is clearer to determine if she may be a candidate for CIR. Would not recommend a consult at this time.  Please contact me with questions.   Estill Dooms, PT, DPT [phone] 12/30/20 3:56 PM

## 2020-12-30 NOTE — Consult Note (Signed)
Neurology Consultation  Reason for Consult: confusion Referring Physician: Dr. Veneda Melter  CC: continued confusion after being found down.   History is obtained from chart and daughter  HPI: Hannah Fisher is a 70 y.o. female with a PMHx of HTN. Patient was admitted of 12/28/20 after being found down by her daughter. Story per daughter is that she last texted the patient on 12/26/20 and patient answered. Other daughter was supposed to visit that day, but could not. No response on Monday from patient. Daughter checked on her Tuesday and found her in the floor in fetal position. Patient was somewhat responsive, but looked as if she did not know what was going on. Daugther then called 911. No noted history of stroke, seizures, head injury, TBI.   Story by patient was that she was in a park with a friend or her daughter at a horse show. She was trying to get up on a bench when daughter had gone to find her phone. Daughter came back 5-10 mins later and found the patient on the grass. Patient states she remembers the helicopter coming to get her.   Patient upon arrival was very confused and somnolent. Patient was found to have AKI and rhabdomyolysis. She has been treated with IVF. Sedating medications were held. CK was 5980 on arrival and today is 3843. Creatinine is back to normal. MRI brain negative for acute findings but positive for white matter changes due to small vessel ischemic disease.  EEG showed left temporal slowing with occasion right. Epileptiform foci in lest temporal lobe with mild diffuse encephalopathy.   Patient's TSH, B12, ammonia, UA, and Hepatitis panel were normal.   Neurology was asked to consult for encephalopathy. Per daughter she is better but still confused and not back to baseline.   ROS: A 14 point ROS was performed and is negative except as noted in the HPI.   Past Medical History:  Diagnosis Date  . Arthritis    hands, knees  . Deafness    left ear sue to shingles   . Hypertension   . Kidney stones   . Osteoporosis 2009   -2.6  . Shingles     Family History  Problem Relation Age of Onset  . Hypertension Mother   . Stroke Mother   . Hypertension Sister   . Hypertension Brother   . Breast cancer Neg Hx     Social History:   reports that she has never smoked. She has never used smokeless tobacco. She reports current alcohol use. She reports that she does not use drugs. States she only uses ETOH occasionally  Medications  Current Facility-Administered Medications:  .  acetaminophen (TYLENOL) tablet 650 mg, 650 mg, Oral, Q6H PRN **OR** acetaminophen (TYLENOL) suppository 650 mg, 650 mg, Rectal, Q6H PRN, Eduard Clos, MD .  Chlorhexidine Gluconate Cloth 2 % PADS 6 each, 6 each, Topical, Daily, Memon, Durward Mallard, MD, 6 each at 12/30/20 0925 .  heparin injection 5,000 Units, 5,000 Units, Subcutaneous, Q8H, Memon, Durward Mallard, MD, 5,000 Units at 12/30/20 0539 .  hydrALAZINE (APRESOLINE) injection 10 mg, 10 mg, Intravenous, Q4H PRN, Eduard Clos, MD .  lactated ringers infusion, , Intravenous, Continuous, Memon, Jehanzeb, MD .  potassium PHOSPHATE 45 mmol in dextrose 5 % 500 mL infusion, 45 mmol, Intravenous, Once, Memon, Durward Mallard, MD, Last Rate: 86 mL/hr at 12/30/20 1059, 45 mmol at 12/30/20 1059 .  tamsulosin (FLOMAX) capsule 0.4 mg, 0.4 mg, Oral, Daily, Erick Blinks, MD   Exam: Current vital  signs: BP 125/82 (BP Location: Right Arm)   Pulse 92   Temp 98.9 F (37.2 C) (Axillary)   Resp 13   Ht 5\' 1"  (1.549 m)   Wt 43.1 kg   LMP 10/02/1990   SpO2 96%   BMI 17.95 kg/m  Vital signs in last 24 hours: Temp:  [98 F (36.7 C)-98.9 F (37.2 C)] 98.9 F (37.2 C) (03/31 0715) Pulse Rate:  [92-100] 92 (03/31 0715) Resp:  [13-18] 13 (03/31 0715) BP: (123-146)/(73-86) 125/82 (03/31 0715) SpO2:  [96 %-99 %] 96 % (03/31 0715)  GENERAL: Awake, alert in NAD HEENT: - Normocephalic and atraumatic, dry mm, no LN++, no  Thyromegaly LUNGS - Normal respiratory effort.  CV - RRR  ABDOMEN - Soft, nontender Ext: warm, well perfused Psych: Affect appropriate.   NEURO:  Mental Status: Alert. Knows she is in the hospital but states she was brought in by helicopter for being on the grass in a park. She does not know the city, month, day, or date. Follows commands.  Speech/Language: speech is without dysarthria or aphasia. Naming and repetition intact. She is fluent in speech and comprehension is intact.  Cranial Nerves:  II: PERRL 3mm/brisk. visual fields full.  III, IV, VI: EOMI. Lid elevation symmetric and full.  V: sensation is intact and symmetrical to face. Moves jaw back and forth.  VII: Smile is symmetrical.  VIII: hearing intact to voice, but states she is deaf in left ear.  IX, X: palate elevation is symmetric. Phonation normal.  XI: normal sternocleidomastoid and trapezius muscle strength 1m is symmetrical without fasciculations.   Motor: Strength is 4-/5 LUE. 4/5 RUE. 4/5 bilateral LEs. Tone is normal. Bulk is decreased. Mild bilateral intentional tremor. Sensation- Intact to light touch bilaterally in all four extremities. Extinction absent to light touch to DSS.  Coordination: FNF intact. No pronator drift. No clonus.   Gait- deferred  Labs I have reviewed labs in epic and the results pertinent to this consultation are: As per HPI.   CBC    Component Value Date/Time   WBC 13.2 (H) 12/30/2020 0043   RBC 3.71 (L) 12/30/2020 0043   HGB 11.4 (L) 12/30/2020 0043   HCT 34.1 (L) 12/30/2020 0043   PLT 227 12/30/2020 0043   MCV 91.9 12/30/2020 0043   MCH 30.7 12/30/2020 0043   MCHC 33.4 12/30/2020 0043   RDW 12.8 12/30/2020 0043   LYMPHSABS 0.5 (L) 12/30/2020 0043   MONOABS 0.9 12/30/2020 0043   EOSABS 0.0 12/30/2020 0043   BASOSABS 0.0 12/30/2020 0043    CMP     Component Value Date/Time   NA 146 (H) 12/30/2020 0043   NA 139 02/02/2020 1021   K 3.6 12/30/2020 0043   CL 108  12/30/2020 0043   CO2 32 12/30/2020 0043   GLUCOSE 172 (H) 12/30/2020 0043   BUN 35 (H) 12/30/2020 0043   BUN 20 02/02/2020 1021   CREATININE 0.79 12/30/2020 0043   CREATININE 0.79 03/03/2014 1650   CALCIUM 9.1 12/30/2020 0043   PROT 5.5 (L) 12/29/2020 0311   ALBUMIN 2.8 (L) 12/30/2020 0043   AST 171 (H) 12/29/2020 0311   ALT 74 (H) 12/29/2020 0311   ALKPHOS 59 12/29/2020 0311   BILITOT 1.5 (H) 12/29/2020 0311   GFRNONAA >60 12/30/2020 0043   GFRAA 88 02/02/2020 1021    Lipid Panel     Component Value Date/Time   CHOL 180 03/03/2014 1650   TRIG 172 (H) 03/03/2014 1650   HDL 59 03/03/2014  1650   CHOLHDL 3.1 03/03/2014 1650   VLDL 34 03/03/2014 1650   LDLCALC 87 03/03/2014 1650    Imaging  CT head 1. No acute intracranial process.  MRI brain 1. No acute intracranial abnormality. Stable noncontrast MRI appearance of the brain since November with mild for age white matter changes, most commonly due to small vessel disease. 2. Superior and left posterior convexity scalp hematoma.  Assessment: 70 yo female who was found down after no contact by family for 2 days. Imaging shows no stroke. Concern for seizure after being found down. EEG is consistent with left temporal slowing with epileptiform foci along with mild diffuse encephalopathy. Discussed AED with patient but she did not want to do this, but do not believe she is able to speak for herself at this time due to encephalopathy. Spoke to daughter who gave permission to start medications and states her mother would be fine with it if she wasn't confused.   Impression: 1. Found down with resulted rhabdo and AKI which are resolved. No other metabolic findings. This is most likely consistent with metabolic encephalopathy, but given EEG findings, see #2. 2. Must consider seizure and given EEG, will start AED.  3. Stroke ruled out.   Recommendations: -Keppra 500mg  IV load followed by Keppra 500 mg po q12 hours-ordered.    -Will do overnight EEG with video and f/up read tomorrow.  -seizure precautions.  -Please have her f/up with outpatient neurology within 2-4 weeks after discharge.   Pt seen by , NP/Neuro and later by MD. Note/plan to be edited by MD as needed.  Pager: Jimmye Norman  I have seen the patient and reviewed the above note.  Certainly seizure is a possibility given the EEG findings, but on exam today she seems more delirious.  I do not see any ongoing seizure activity on EEG while I am in the room.  She seems more delirious, though she is oriented to "hospital," she thinks she is at [place] long.  She is unable to spell world backwards.  She gives the year as 2020 and the month as August.  On admission, she was found to be in renal failure with rhabdomyolysis, and a BUN of 60.  The fact that she is improving is reassuring, it is possible that this represents delirium in the setting of dehydration, AKI and rhabdo.  With a scalp hematoma, mechanical fall with postconcussive syndrome is also possible.  She takes a B complex vitamin, therefore thiamine deficiency is less likely.  Agree with overnight EEG and keppra, though if not improving may consider changing to depakote.   September, MD [facility] [phone]  If 7pm- 7am, please page neurology on call as listed in [facility]ION.

## 2020-12-30 NOTE — Progress Notes (Signed)
PROGRESS NOTE    Hannah Fisher  CBJ:[id number] DOB: 11-29-1950 DOA: 12/28/2020 PCP: Daisy Floro, MD    Brief Narrative:  70 year old female with a history of hypertension, admitted to the hospital after she was found laying on the floor for unknown period of time.  She was noted to be somnolent and confused.  Work-up including MRI imaging, metabolic work-up has been unrevealing.  She was noted to have rhabdomyolysis and acute kidney injury.  She is receiving IV fluids.  Holding sedating medications.  EEG abnormal with possible epileptiform discharges in left temporal area. Neurology consulted. Continue to follow mental status.   Assessment & Plan:   Principal Problem:   Acute encephalopathy Active Problems:   Rhabdomyolysis   Acute encephalopathy -etiology is unclear, may possibly be related to renal failure/dehydration -Daughter feels it is unlikely that patient took excess medications -MRI brain does not show any acute intracranial abnormality -Urinalysis did not show any signs of infection -TSH, ammonia, B12 are unremarkable -EEG shows possible epileptiform discharges in the left and possibly right temporal areas -Neurology consulted -Currently, she is awake, but remains confused. -Continue to monitor  Acute kidney injury -Suspect related to dehydration in the setting of ACE inhibitor/thiazide use as well as rhabdomyolysis -Improved with hydration  Urinary retention -had foley catheter placed overnight -check abdominal xray to evaluate for constipation -will start on bethanecol and tamsulosin -can consider voiding trial tomorrow  Leukocytosis -Possibly related to hemoconcentration -No obvious sources of infection at this time -trending down with IV fluids -Continue to monitor  Hypertension -Holding ACE inhibitor as well as thiazide due to renal failure, blood pressure currently stable  Acute traumatic rhabdomyolysis -Secondary to fall -recent CK level  of 6268, repeat level from this morning pending -continue IV fluids -Monitor urine output  Fall -Daughter reports that patient has been falling more frequently -She does have some pain in her hips bilaterally, xray of pelvis unremarkable -Physical therapy evaluation  Elevated LFTs -Imaging of gallbladder unrevealing -Suspect this is related to rhabdomyolysis -Hepatitis panel negative -No abdominal tenderness on exam  Hypokalemia -Replaced -Magnesium normal  Hypophosphatemia -replace  History of chronic weakness of upper and lower extremities -B12 is normal -She is followed by an outpatient neurologist   DVT prophylaxis: heparin injection 5,000 Units Start: 12/29/20 1630 SCDs Start: 12/28/20 2226  Heparin [place] 5000 units TID  Code Status: full code Family Communication: discussed with daughter at the bedside Disposition Plan: Status is: Inpatient  The patient will require care spanning > 2 midnights and should be moved to inpatient because: Altered mental status  Dispo: The patient is from: Home              Anticipated d/c is to: TBD              Patient currently is not medically stable to d/c.   Difficult to place patient No  Consultants:     Procedures:  EEG: This is an abnormal waiting and drowsy study due to 1) left temporal (and possibly very occasionally right temporal) slowing and definitively epileptiform discharges (with an aftergoing slow wave distinguishing from wickets).  This finding would be suggestive of an epileptiform foci in the left temporal area (possibly relatively deep with occasional representation in the contralateral hemisphere on surface EEG).    This is superimposed upon diminished background organization as described above, most clinically compatible with a mild diffuse encephalopathy.    There were no seizures however.    Antimicrobials:  Subjective: Developed some urinary retention since admission and currently has  foley catheter. She is awake, but remains confused.   Objective: Vitals:   12/29/20 2006 12/29/20 2345 12/30/20 0435 12/30/20 0715  BP: 129/76 127/83 123/73 125/82  Pulse: 95 100 92 92  Resp: 15 18 15 13   Temp: 98.5 F (36.9 C) 98.3 F (36.8 C) 98.1 F (36.7 C) 98.9 F (37.2 C)  TempSrc: Axillary Axillary Axillary Axillary  SpO2: 99% 96% 97% 96%  Weight:      Height:        Intake/Output Summary (Last 24 hours) at 12/30/2020 0930 Last data filed at 12/30/2020 0548 Gross per 24 hour  Intake 1357.02 ml  Output 1750 ml  Net -392.98 ml   Filed Weights   12/28/20 1612  Weight: 43.1 kg    Examination:  General exam: Alert, awake, oriented x 3 Respiratory system: Clear to auscultation. Respiratory effort normal. Cardiovascular system:RRR. No murmurs, rubs, gallops. Gastrointestinal system: Abdomen is nondistended, soft and nontender. No organomegaly or masses felt. Normal bowel sounds heard. Central nervous system: Alert and oriented. No focal neurological deficits. Extremities: No C/C/E, +pedal pulses Skin: No rashes, lesions or ulcers Psychiatry: oriented x 3, but still confused in conversation     Data Reviewed: I have personally reviewed following labs and imaging studies  CBC: Recent Labs  Lab 12/28/20 1631 12/29/20 0311 12/30/20 0043  WBC 21.8* 19.5* 13.2*  NEUTROABS 19.6*  --  11.7*  HGB 14.1 12.5 11.4*  HCT 43.0 37.4 34.1*  MCV 93.5 92.1 91.9  PLT 350 281 227   Basic Metabolic Panel: Recent Labs  Lab 12/28/20 1631 12/29/20 0311 12/30/20 0043  NA 141 142 146*  K 3.2* 3.3* 3.6  CL 107 110 108  CO2 15* 19* 32  GLUCOSE 155* 127* 172*  BUN 58* 52* 35*  CREATININE 1.27* 1.11* 0.79  CALCIUM 9.5 9.4 9.1  MG  --   --  2.6*  PHOS  --   --  1.1*   GFR: Estimated Creatinine Clearance: 44.5 mL/min (by C-G formula based on SCr of 0.79 mg/dL). Liver Function Tests: Recent Labs  Lab 12/28/20 1631 12/29/20 0311 12/30/20 0043  AST 142* 171*  --   ALT  66* 74*  --   ALKPHOS 69 59  --   BILITOT 1.6* 1.5*  --   PROT 6.7 5.5*  --   ALBUMIN 4.1 3.3* 2.8*   No results for input(s): LIPASE, AMYLASE in the last 168 hours. Recent Labs  Lab 12/29/20 0311  AMMONIA 32   Coagulation Profile: No results for input(s): INR, PROTIME in the last 168 hours. Cardiac Enzymes: Recent Labs  Lab 12/28/20 1631 12/29/20 0311  CKTOTAL 5,980* 6,268*   BNP (last 3 results) No results for input(s): PROBNP in the last 8760 hours. HbA1C: No results for input(s): HGBA1C in the last 72 hours. CBG: Recent Labs  Lab 12/28/20 1741  GLUCAP 140*   Lipid Profile: No results for input(s): CHOL, HDL, LDLCALC, TRIG, CHOLHDL, LDLDIRECT in the last 72 hours. Thyroid Function Tests: Recent Labs    12/28/20 2140  TSH 0.436   Anemia Panel: Recent Labs    12/28/20 2140  VITAMINB12 644   Sepsis Labs: Recent Labs  Lab 12/28/20 2140 12/28/20 2352  LATICACIDVEN 2.6* 1.2    Recent Results (from the past 240 hour(s))  Culture, blood (routine x 2)     Status: None (Preliminary result)   Collection Time: 12/28/20  9:40 PM   Specimen: BLOOD  Result Value Ref Range Status   Specimen Description BLOOD SITE NOT SPECIFIED  Final   Special Requests   Final    BOTTLES DRAWN AEROBIC AND ANAEROBIC Blood Culture adequate volume   Culture   Final    NO GROWTH 2 DAYS Performed at [facility], [address], [place], [place] [address]    Report Status PENDING  Incomplete  Culture, blood (routine x 2)     Status: None (Preliminary result)   Collection Time: 12/28/20  9:40 PM   Specimen: BLOOD  Result Value Ref Range Status   Specimen Description BLOOD SITE NOT SPECIFIED  Final   Special Requests   Final    BOTTLES DRAWN AEROBIC AND ANAEROBIC Blood Culture adequate volume   Culture   Final    NO GROWTH 2 DAYS Performed at [facility], [address] [place], [place] [address]    Report Status PENDING  Incomplete  Resp Panel by RT-PCR (Flu  A&B, Covid) Nasopharyngeal Swab     Status: None   Collection Time: 12/28/20 10:10 PM   Specimen: Nasopharyngeal Swab; Nasopharyngeal(NP) swabs in vial transport medium  Result Value Ref Range Status   SARS Coronavirus 2 by RT PCR NEGATIVE NEGATIVE Final    Comment: (NOTE) SARS-CoV-2 target nucleic acids are NOT DETECTED.  The SARS-CoV-2 RNA is generally detectable in upper respiratory specimens during the acute phase of infection. The lowest concentration of SARS-CoV-2 viral copies this assay can detect is 138 copies/mL. A negative result does not preclude SARS-Cov-2 infection and should not be used as the sole basis for treatment or other patient management decisions. A negative result may occur with  improper specimen collection/handling, submission of specimen other than nasopharyngeal swab, presence of viral mutation(s) within the areas targeted by this assay, and inadequate number of viral copies(<138 copies/mL). A negative result must be combined with clinical observations, patient history, and epidemiological information. The expected result is Negative.  Fact Sheet for Patients:  BloggerCourse.com  Fact Sheet for Healthcare Providers:  SeriousBroker.it  This test is no t yet approved or cleared by the [place] FDA and  has been authorized for detection and/or diagnosis of SARS-CoV-2 by FDA under an Emergency Use Authorization (EUA). This EUA will remain  in effect (meaning this test can be used) for the duration of the COVID-19 declaration under Section 564(b)(1) of the Act, 21 U.S.C.section 360bbb-3(b)(1), unless the authorization is terminated  or revoked sooner.       Influenza A by PCR NEGATIVE NEGATIVE Final   Influenza B by PCR NEGATIVE NEGATIVE Final    Comment: (NOTE) The Xpert Xpress SARS-CoV-2/FLU/RSV plus assay is intended as an aid in the diagnosis of influenza from Nasopharyngeal swab specimens  and should not be used as a sole basis for treatment. Nasal washings and aspirates are unacceptable for Xpert Xpress SARS-CoV-2/FLU/RSV testing.  Fact Sheet for Patients: BloggerCourse.com  Fact Sheet for Healthcare Providers: SeriousBroker.it  This test is not yet approved or cleared by the [place] FDA and has been authorized for detection and/or diagnosis of SARS-CoV-2 by FDA under an Emergency Use Authorization (EUA). This EUA will remain in effect (meaning this test can be used) for the duration of the COVID-19 declaration under Section 564(b)(1) of the Act, 21 U.S.C. section 360bbb-3(b)(1), unless the authorization is terminated or revoked.  Performed at [facility], [address] [place], [place] [address]          Radiology Studies: DG  Chest 2 View  Result Date: 12/28/2020 CLINICAL DATA:  Altered mental status, fall EXAM: CHEST - 2 VIEW COMPARISON:  05/14/2020, 11/18/2020 FINDINGS: No focal opacity or pleural effusion. Cardiomediastinal silhouette within normal limits. Aortic atherosclerosis. No pneumothorax. IMPRESSION: No active cardiopulmonary disease. Electronically Signed   By: Jasmine Pang M.D.   On: 12/28/2020 17:00   DG Abd 1 View  Result Date: 12/29/2020 CLINICAL DATA:  Possible foreign body EXAM: ABDOMEN - 1 VIEW COMPARISON:  None. FINDINGS: Scattered large and small bowel gas is noted. Considerable retained fecal material is noted consistent with constipation. No free air is seen. No radiopaque foreign body is noted. IMPRESSION: Changes consistent with colonic constipation. No other focal abnormality is noted. Electronically Signed   By: Alcide Clever M.D.   On: 12/29/2020 02:55   CT Head Wo Contrast  Result Date: 12/28/2020 CLINICAL DATA:  Mental status change, fell, trauma EXAM: CT HEAD WITHOUT CONTRAST TECHNIQUE: Contiguous axial images were obtained from the base of the skull through the  vertex without intravenous contrast. COMPARISON:  08/10/2020 FINDINGS: Brain: No acute infarct or hemorrhage. Lateral ventricles and midline structures are stable. No acute extra-axial fluid collections. No mass effect. Vascular: No hyperdense vessel or unexpected calcification. Skull: Normal. Negative for fracture or focal lesion. Sinuses/Orbits: No acute finding. Other: None. IMPRESSION: 1. No acute intracranial process. Electronically Signed   By: Sharlet Salina M.D.   On: 12/28/2020 17:29   CT Cervical Spine Wo Contrast  Result Date: 12/28/2020 CLINICAL DATA:  Larey Seat, trauma EXAM: CT CERVICAL SPINE WITHOUT CONTRAST TECHNIQUE: Multidetector CT imaging of the cervical spine was performed without intravenous contrast. Multiplanar CT image reconstructions were also generated. COMPARISON:  09/07/2020 FINDINGS: Alignment: Right convex curvature of the cervical spine. Otherwise alignment is anatomic. Skull base and vertebrae: No acute fracture. No primary bone lesion or focal pathologic process. Soft tissues and spinal canal: No prevertebral fluid or swelling. No visible canal hematoma. Disc levels: Prominent spondylosis at C4-5 and C5-6. Mild symmetrical neural foraminal encroachment at C4-5. Upper chest: Airway is patent.  Lung apices are clear. Other: Reconstructed images demonstrate no additional findings. IMPRESSION: 1. Mild cervical spondylosis and right convex scoliosis. No acute cervical spine fracture. Electronically Signed   By: Sharlet Salina M.D.   On: 12/28/2020 17:31   MR BRAIN WO CONTRAST  Result Date: 12/29/2020 CLINICAL DATA:  70 year old female with confusion, altered mental status. Weakness and tremors. EXAM: MRI HEAD WITHOUT CONTRAST TECHNIQUE: Multiplanar, multiecho pulse sequences of the brain and surrounding structures were obtained without intravenous contrast. COMPARISON:  Head CT 12/28/2020.  Brain MRI 08/10/2020. FINDINGS: Brain: Some T2 shine through is noted in the left parietal lobe  white matter on series 9, image 33. No convincing restricted diffusion to suggest acute infarction. No midline shift, mass effect, evidence of mass lesion, ventriculomegaly, extra-axial collection or acute intracranial hemorrhage. Cervicomedullary junction and pituitary are within normal limits. Generally mild for age nonspecific white matter T2 and FLAIR hyperintensity, which is most pronounced in the left parietal lobe. No chronic cerebral blood products. No cortical encephalomalacia identified. Deep gray matter nuclei, brainstem and cerebellum are within normal limits for age. Vascular: Major intracranial vascular flow voids are stable since November with generalized intracranial artery tortuosity. Skull and upper cervical spine: Stable and negative for age visible cervical spine. Normal visible bone marrow signal. Sinuses/Orbits: Stable, negative. Other: Mastoids remain clear. Grossly stable and normal visible internal auditory structures. Superior vertex and left posterior convexity scalp hematoma best demonstrated on series  7, images 93, 85. IMPRESSION: 1. No acute intracranial abnormality. Stable noncontrast MRI appearance of the brain since November with mild for age white matter changes, most commonly due to small vessel disease. 2. Superior and left posterior convexity scalp hematoma. Electronically Signed   By: Odessa Fleming M.D.   On: 12/29/2020 05:09   DG Pelvis Portable  Result Date: 12/29/2020 CLINICAL DATA:  Fall. EXAM: PORTABLE PELVIS 1-2 VIEWS COMPARISON:  None. FINDINGS: The cortical margins of the bony pelvis are intact. No fracture. Pubic symphysis and sacroiliac joints are congruent. Both femoral heads are well-seated in the respective acetabula. IMPRESSION: No pelvic fracture. Electronically Signed   By: Narda Rutherford M.D.   On: 12/29/2020 16:55   EEG adult  Result Date: 12/29/2020 Lilian Coma, MD     12/29/2020  7:09 PM EEG Report Indication: possible seizure activity This study  was recorded in the waiting and drowsy state.  The duration of the study was 26 minutes.  Electrodes were placed according to the International 10/20 system.  Video was reviewed/available for clinical correlation as needed. In the waking state, discernible but diminished background organization is seen with a mildly attenuated anterior - posterior voltage and frequency gradient.  In the occipital leads there was a symmetric and reactive although indistinct and poorly sustained posterior dominant rhythm of approximately 7 hertz, which is slower than expected for age  Anteriorly, is the expected pattern of faster frequency, lower voltage waveforms, although in a reduced amount as compared to normal. During the drowsy state, there is further attenuation of the gradient, a general shift to slower frequencies diffusely, a waxing and waning of the posterior dominant rhythm, and slow roving eye movements. There were intermittently typically isolated Spike waveforms seen in the left temporal region, and very occasionally the right. In addition, there is some degree of slowing in the bitemporal regions, although to a very modest degree.  On occasion the spike waves have an aftergoing slow wave (e.g.,  at 11:37:01 of the recording)).  These are strongly favored to be epileptiform discharges. Hyperventilation: deferred Photic stimulation: deferred Impression: This is an abnormal waiting and drowsy study due to 1) left temporal (and possibly very occasionally right temporal) slowing and definitively epileptiform discharges (with an aftergoing slow wave distinguishing from wickets). This finding would be suggestive of an epileptiform foci in the left temporal area (possibly relatively deep with occasional representation in the contralateral hemisphere on surface EEG).  This is superimposed upon diminished background organization as described above, most clinically compatible with a mild diffuse encephalopathy.  There were no  seizures however.   US Abdomen Limited RUQ (LIVER/GB)  Result Date: 12/29/2020 CLINICAL DATA:  Elevated liver function tests EXAM: ULTRASOUND ABDOMEN LIMITED RIGHT UPPER QUADRANT COMPARISON:  None. FINDINGS: Gallbladder: No gallstones or wall thickening visualized. No sonographic Murphy sign noted by sonographer. Common bile duct: Diameter: 4 mm in proximal diameter Liver: No focal lesion identified. Within normal limits in parenchymal echogenicity. Portal vein is patent on color Doppler imaging with normal direction of blood flow towards the liver. Other: None. IMPRESSION: Normal right upper quadrant sonogram. Electronically Signed   By: Helyn Numbers MD   On: 12/29/2020 06:29        Scheduled Meds: . Chlorhexidine Gluconate Cloth  6 each Topical Daily  . heparin injection (subcutaneous)  5,000 Units Subcutaneous Q8H   Continuous Infusions: . potassium PHOSPHATE IVPB (in mmol)    . sodium bicarbonate 150 mEq, potassium chloride 40 mEq in dextrose  5% 1,000 mL infusion Stopped (12/30/20 0912)     LOS: 1 day    Time spent: 35mins    Erick BlinksJehanzeb Naythen Heikkila, MD [facility]   If 7PM-7AM, please contact night-coverage [email or web address]  12/30/2020, 9:30 AM

## 2020-12-30 NOTE — Progress Notes (Signed)
LTM EEG hooked up and running - no initial skin breakdown - push button tested - neuro notified.  

## 2020-12-31 DIAGNOSIS — E872 Acidosis: Secondary | ICD-10-CM | POA: Diagnosis not present

## 2020-12-31 DIAGNOSIS — N179 Acute kidney failure, unspecified: Secondary | ICD-10-CM | POA: Diagnosis not present

## 2020-12-31 DIAGNOSIS — G9340 Encephalopathy, unspecified: Secondary | ICD-10-CM | POA: Diagnosis not present

## 2020-12-31 DIAGNOSIS — R7989 Other specified abnormal findings of blood chemistry: Secondary | ICD-10-CM | POA: Diagnosis not present

## 2020-12-31 LAB — RENAL FUNCTION PANEL
Albumin: 2.5 g/dL — ABNORMAL LOW (ref 3.5–5.0)
Anion gap: 5 (ref 5–15)
BUN: 20 mg/dL (ref 8–23)
CO2: 32 mmol/L (ref 22–32)
Calcium: 8.4 mg/dL — ABNORMAL LOW (ref 8.9–10.3)
Chloride: 102 mmol/L (ref 98–111)
Creatinine, Ser: 0.59 mg/dL (ref 0.44–1.00)
GFR, Estimated: >60 mL/min (ref >60)
Glucose, Bld: 112 mg/dL — ABNORMAL HIGH (ref 70–99)
Phosphorus: 2.3 mg/dL — ABNORMAL LOW (ref 2.5–4.6)
Potassium: 3.1 mmol/L — ABNORMAL LOW (ref 3.5–5.1)
Sodium: 139 mmol/L (ref 135–145)

## 2020-12-31 LAB — CBC
HCT: 30.9 % — ABNORMAL LOW (ref 36.0–46.0)
Hemoglobin: 10.2 g/dL — ABNORMAL LOW (ref 12.0–15.0)
MCH: 31.1 pg (ref 26.0–34.0)
MCHC: 33 g/dL (ref 30.0–36.0)
MCV: 94.2 fL (ref 80.0–100.0)
Platelets: 188 10*3/uL (ref 150–400)
RBC: 3.28 MIL/uL — ABNORMAL LOW (ref 3.87–5.11)
RDW: 12.8 % (ref 11.5–15.5)
WBC: 8.5 10*3/uL (ref 4.0–10.5)
nRBC: 0 % (ref 0.0–0.2)

## 2020-12-31 LAB — URINE CULTURE: Culture: NO GROWTH

## 2020-12-31 LAB — CK: Total CK: 2150 U/L — ABNORMAL HIGH (ref 38–234)

## 2020-12-31 MED ORDER — BETHANECHOL CHLORIDE 5 MG PO TABS
5.0000 mg | ORAL_TABLET | Freq: Three times a day (TID) | ORAL | Status: DC
  Administered 2020-12-31 – 2021-01-03 (×9): 5 mg via ORAL
  Filled 2020-12-31 (×9): qty 1

## 2020-12-31 MED ORDER — POTASSIUM PHOSPHATES 15 MMOLE/5ML IV SOLN
15.0000 mmol | Freq: Once | INTRAVENOUS | Status: AC
  Administered 2020-12-31: 15 mmol via INTRAVENOUS
  Filled 2020-12-31: qty 5

## 2020-12-31 MED ORDER — POLYETHYLENE GLYCOL 3350 17 G PO PACK
17.0000 g | PACK | Freq: Every day | ORAL | Status: DC
  Administered 2020-12-31 – 2021-01-04 (×5): 17 g via ORAL
  Filled 2020-12-31 (×4): qty 1

## 2020-12-31 NOTE — NC FL2 (Signed)
[place] MEDICAID FL2 LEVEL OF CARE SCREENING TOOL     IDENTIFICATION  Patient Name: Hannah Fisher Birthdate: [birth year] Sex: female Admission Date (Current Location): 12/28/2020  [facility] and [place] Number:  Producer, television/film/video and Address:  [facility], [address], [place], [place] [address]      Provider Number: [id number]  Attending Physician Name and Address:  Erick Blinks, MD  Relative Name and Phone Number:  [name], daughter, [phone]    Current Level of Care: Hospital Recommended Level of Care: Skilled Nursing Facility Prior Approval Number:    Date Approved/Denied:   PASRR Number: [id number]  Discharge Plan: SNF    Current Diagnoses: Patient Active Problem List   Diagnosis Date Noted  . Acute encephalopathy 12/28/2020  . Rhabdomyolysis 12/28/2020  . Deafness   . Shingles   . Osteoporosis   . Hypertension     Orientation RESPIRATION BLADDER Height & Weight     Self  Normal Incontinent,Indwelling catheter Weight: 95 lb (43.1 kg) Height:  5\' 1"  (154.9 cm)  BEHAVIORAL SYMPTOMS/MOOD NEUROLOGICAL BOWEL NUTRITION STATUS      Incontinent Diet (Please see dc summary)  AMBULATORY STATUS COMMUNICATION OF NEEDS Skin   Extensive Assist Verbally Normal                       Personal Care Assistance Level of Assistance  Bathing,Feeding,Dressing Bathing Assistance: Maximum assistance Feeding assistance: Limited assistance Dressing Assistance: Maximum assistance     Functional Limitations Info  Sight,Hearing,Speech Sight Info: Adequate Hearing Info: Adequate Speech Info: Adequate    SPECIAL CARE FACTORS FREQUENCY  PT (By licensed PT),OT (By licensed OT)     PT Frequency: 5x/week OT Frequency: 5x/week            Contractures Contractures Info: Not present    Additional Factors Info  Allergies,Code Status Code Status Info: Full Allergies Info: Penicillin V, Penicillins, Sulfa Antibiotics            Current Medications (12/31/2020):  This is the current hospital active medication list Current Facility-Administered Medications  Medication Dose Route Frequency Provider Last Rate Last Admin  . acetaminophen (TYLENOL) tablet 650 mg  650 mg Oral Q6H PRN 03/02/2021, MD       Or  . acetaminophen (TYLENOL) suppository 650 mg  650 mg Rectal Q6H PRN Eduard Clos, MD      . bethanechol (URECHOLINE) tablet 5 mg  5 mg Oral TID Eduard Clos, MD   5 mg at 12/31/20 1437  . Chlorhexidine Gluconate Cloth 2 % PADS 6 each  6 each Topical Daily 03/02/21, MD   6 each at 12/31/20 03/02/21  . heparin injection 5,000 Units  5,000 Units Subcutaneous Q8H 8469, MD   5,000 Units at 12/31/20 1437  . hydrALAZINE (APRESOLINE) injection 10 mg  10 mg Intravenous Q4H PRN 03/02/21, MD      . lactated ringers infusion   Intravenous Continuous Eduard Clos, MD 75 mL/hr at 12/31/20 0515 Started During Downtime at 12/31/20 0515  . levETIRAcetam (KEPPRA) tablet 500 mg  500 mg Oral BID 03/02/21, NP   500 mg at 12/31/20 03/02/21  . polyethylene glycol (MIRALAX / GLYCOLAX) packet 17 g  17 g Oral Daily 6295, MD   17 g at 12/31/20 1040  . tamsulosin (FLOMAX) capsule 0.4 mg  0.4 mg Oral Daily 03/02/21, MD   0.4 mg at 12/31/20 [phone]  Discharge Medications: Please see discharge summary for a list of discharge medications.  Relevant Imaging Results:  Relevant Lab Results:   Additional Information SSN: [id number]. Pfizer vaccines on 10/21/20 and 11/11/20  Mearl Latin, LCSW

## 2020-12-31 NOTE — Progress Notes (Signed)
LTM EEG discontinued - no skin breakdown at unhook.   

## 2020-12-31 NOTE — Procedures (Signed)
EEG Procedure CPT/Type of Study: 95720; 24hr EEG with video Referring Provider: Memon Primary Neurological Diagnosis: AMS  History: This is a 70 yr old patient, undergoing an EEG to evaluate for acute encephalopathy. Clinical State: disoriented  Technical Description:  The EEG was performed using standard setting per the guidelines of American Clinical Neurophysiology Society (ACNS).  A minimum of 21 electrodes were placed on scalp according to the International 10-20 or/and 10-10 Systems. Supplemental electrodes were placed as needed. Single EKG electrode was also used to detect cardiac arrhythmia. Patient's behavior was continuously recorded on video simultaneously with EEG. A minimum of 16 channels were used for data display. Each epoch of study was reviewed manually daily and as needed using standard referential and bipolar montages. Computerized quantitative EEG analysis (such as compressed spectral array analysis, trending, automated spike & seizure detection) were used as indicated.   Day 1: from 1304 12/30/20 to 0730 12/31/20  EEG Description: Overall Amplitude:Normal Predominant Frequency: The background activity showed posterior dominant alpha, with about 7 Hz, that was rare. Superimposed Frequencies: frequent delta and some theta activity bilaterally The background was symmetric  Background Abnormalities: Diffuse slowing as above Rhythmic or periodic pattern: No Epileptiform activity: no Electrographic seizures: no Events: no   Breach rhythm: no  Reactivity: Present  Stimulation procedures:  Hyperventilation: not done Photic stimulation: not done  Sleep Background: Stage II  EKG:no significant arrhythmia  Impression: This was an abnormal continuous video EEG due to diffuse background slowing, consistent with a non-specific encephalopathy pattern. No epileptiform discharges or seizures were seen.

## 2020-12-31 NOTE — Plan of Care (Signed)
Brief neuro note. This is a no charge note.   So far, LTM EEG has been negative for seizures. Patient was placed on Keppra 500mg  po q12 hour. LTM will be discontinued at 1300 hours and we will re evaluate results.   Patient needs outpatient neuro appointment 2-4 weeks after discharge.   , NP Neurology

## 2020-12-31 NOTE — Progress Notes (Signed)
Physical Therapy Treatment Patient Details Name: Hannah Fisher MRN: [id number] DOB: [birth year] Today's Date: 12/31/2020    History of Present Illness 70 y.o. female with progressive weakness had MRI done in November 2020 one of the brain which was unremarkable, Daughter found her on floor and confused 3/29 and brought her to ED. Last known well 3/27. In ED found to be confused and tremulous, labs remarkable for elevated creatine and CK levels MRI unremarkable. EEG suggestive of epileptiform foci in the left temporal area Admitted for acute encephalopathy, and rhabdomyolysis PMH: hypertension, OA, osteoporosis    PT Comments    Pt is agreeable to working with therapy today. Slightly better tone in LE today and pt able to provide more assist with straightening out LE, knees continue to be tightest. Pt able to initiate LE movement across bed, Pt command follow decreases with coming to upright. Pt requires total Ax2 for coming to EoB, pt requires single step commands to achieve. Once sitting EoB requires total to minA for seated balance, with cues to right herself from left posterior lean. Attempted coming to standing however pt unable to straighten LE or rotate pelvis to come to upright. Returned to bed with total A. Pt has poor motor planning and sequencing, in presence of very slow response both verbally and physically. Pt with increased desire to return to PLOF and is frustrated by no clear answer to the cause of her increased confusion and physical limitations. PT continues to enthusiastically recommend CIR level rehab at discharge.    Follow Up Recommendations  CIR     Equipment Recommendations  3in1 (PT);Wheelchair (measurements PT);Wheelchair cushion (measurements PT)    Recommendations for Other Services Rehab consult;OT consult     Precautions / Restrictions Precautions Precautions: Fall Restrictions Weight Bearing Restrictions: No    Mobility  Bed Mobility Overal bed mobility:  Needs Assistance Bed Mobility: Supine to Sit;Sit to Supine;Rolling Rolling: Mod assist   Supine to sit: Total assist;HOB elevated;+2 for safety/equipment Sit to supine: Total assist;+2 for safety/equipment   General bed mobility comments: pt agreeable to sitting EoB, however has difficulty problem solving to sequence management of LE off EoB and requires one step commands to achieve LE to EoB, the requires total A to bring LE off EOB and bring trunk to upright, Requires total A to return to bed, and modA for rolling R and L for pericare    Transfers Overall transfer level: Needs assistance Equipment used: 2 person hand held assist Transfers: Sit to/from Stand Sit to Stand: Total assist;+2 physical assistance;+2 safety/equipment;From elevated surface         General transfer comment: Attempted sit to stand with total A X2, pt unable straighten legs and posteriorly tilt pelvis to come to upright. crouch possition not condusive to side-stepping  Ambulation/Gait             General Gait Details: unable         Balance Overall balance assessment: Needs assistance Sitting-balance support: Feet supported;Bilateral upper extremity supported Sitting balance-Leahy Scale: Poor Sitting balance - Comments: requires at least Min A to maintain balance sititng EOB Postural control: Posterior lean;Left lateral lean Standing balance support: During functional activity Standing balance-Leahy Scale: Zero                              Cognition Arousal/Alertness: Awake/alert Behavior During Therapy: WFL for tasks assessed/performed;Flat affect Overall Cognitive Status: Impaired/Different from baseline Area of Impairment: Attention;Awareness;Problem  solving;Safety/judgement;Following commands;Memory;Orientation                 Orientation Level: Disoriented to;Situation Current Attention Level: Selective Memory: Decreased short-term memory Following Commands: Follows  one step commands inconsistently;Follows one step commands with increased time;Follows multi-step commands inconsistently Safety/Judgement: Decreased awareness of deficits;Decreased awareness of safety Awareness: Emergent Problem Solving: Slow processing;Decreased initiation;Difficulty sequencing;Requires verbal cues;Requires tactile cues General Comments: Pt answered location, date accurately but when reporting situation that led to admission - was referring to watching people ride horses and experienced a fall. Per chart, pt was found down at home. Pt with decreased attention to tasks and difficulty in distracting environment. Able to answer questions, converse appropriately statically in bed but when it came to movement/getting EOB, increased difficulty following commands, motor planning issues noted and slower processing.         General Comments General comments (skin integrity, edema, etc.): Continuous EEG on during session - checked in with RN prior to session.      Pertinent Vitals/Pain Pain Assessment: Faces Faces Pain Scale: Hurts a little bit Pain Location: LEs, generalized Pain Descriptors / Indicators: Grimacing;Guarding Pain Intervention(s): Limited activity within patient's tolerance;Monitored during session;Repositioned    Home Living Family/patient expects to be discharged to:: Private residence Living Arrangements: Alone Available Help at Discharge: Family Type of Home: House Home Access: Stairs to enter   Home Layout: One level Home Equipment: Hand held shower head      Prior Function Level of Independence: Independent with assistive device(s);Needs assistance  Gait / Transfers Assistance Needed: used Rollator for ambulation or in home used furniture for support, pt's daughter reports her gait has been getting progressively worse since the sudden onset of confusion (and apparent tone?) in June ADL's / Homemaking Assistance Needed: reports independence in self care  but admits difficulty getting in and out of shower, daughter stopped taking her shopping about 4 months ago, able to fix premade meals, no longer cooks, Comments: Daughter [name] filled in gaps in information   PT Goals (current goals can now be found in the care plan section) Acute Rehab PT Goals Patient Stated Goal: eat breakfast, be able to walk again PT Goal Formulation: With patient/family Time For Goal Achievement: 01/13/21 Potential to Achieve Goals: Fair    Frequency    Min 3X/week      PT Plan Current plan remains appropriate    Co-evaluation PT/OT/SLP Co-Evaluation/Treatment: Yes Reason for Co-Treatment: Complexity of the patient's impairments (multi-system involvement) PT goals addressed during session: Mobility/safety with mobility;Balance;Strengthening/ROM        AM-PAC PT "6 Clicks" Mobility   Outcome Measure  Help needed turning from your back to your side while in a flat bed without using bedrails?: A Lot Help needed moving from lying on your back to sitting on the side of a flat bed without using bedrails?: Total Help needed moving to and from a bed to a chair (including a wheelchair)?: Total Help needed standing up from a chair using your arms (e.g., wheelchair or bedside chair)?: Total Help needed to walk in hospital room?: Total Help needed climbing 3-5 steps with a railing? : Total 6 Click Score: 7    End of Session Equipment Utilized During Treatment: Gait belt Activity Tolerance: Patient tolerated treatment well Patient left: in bed;with call bell/phone within reach;with bed alarm set;with family/visitor present Nurse Communication: Mobility status PT Visit Diagnosis: Unsteadiness on feet (R26.81);Other abnormalities of gait and mobility (R26.89);Repeated falls (R29.6);Muscle weakness (generalized) (M62.81);History of falling (Z91.81);Difficulty in  walking, not elsewhere classified (R26.2);Other symptoms and signs involving the nervous system  (R29.898);Pain Pain - Right/Left:  (bilateral) Pain - part of body: Leg     Time: 9326-7124 PT Time Calculation (min) (ACUTE ONLY): 40 min  Charges:  $Therapeutic Activity: 8-22 mins                     Gelene Recktenwald B. Beverely Risen PT, DPT Acute Rehabilitation Services Pager [phone] Office [phone]    Elon Alas Fleet 12/31/2020, 1:21 PM

## 2020-12-31 NOTE — Evaluation (Signed)
Occupational Therapy Evaluation Patient Details Name: Hannah Fisher MRN: [id number] DOB: [birth year] Today's Date: 12/31/2020    History of Present Illness 70 y.o. female with progressive weakness had MRI done in November 2020 one of the brain which was unremarkable, Daughter found her on floor and confused 3/29 and brought her to ED. Last known well 3/27. In ED found to be confused and tremulous, labs remarkable for elevated creatine and CK levels MRI unremarkable. EEG suggestive of epileptiform foci in the left temporal area Admitted for acute encephalopathy, and rhabdomyolysis PMH: hypertension, OA, osteoporosis   Clinical Impression   PTA, pt lives alone and typically ambulatory with Rollator and able to complete ADLs though difficulty with shower transfers. Pt receives assistance for IADLs as needed from family. Pt presents now with deficits in cognition, strength, tone, visual-perceptual skills, balance and endurance. Pt  A&Ox3 this AM, able to answer questions/follow directions appropriately while in bed. However, once beginning bed mobility to sit EOB, pt with increased difficulty with motor planning and initiating this task requiring Total A. While sitting EOB, pt noted with posterior/left lateral sway requiring at least Min A to maintain balance EOB. Sit to stand and side step attempts required Total A x 2. Pt requires Max A for UB ADLs and Total A for LB ADLs due to deficits (+2 if in standing). Pt able to feed self with setup though noted difficulty locating items on tray. Further cognitive and visual assessment indicated.   Based on pt's higher PLOF, family support and motivation, recommend CIR consult for comprehensive and intensive therapies from PT/OT/SLP.     Follow Up Recommendations  CIR;Supervision/Assistance - 24 hour    Equipment Recommendations  3 in 1 bedside commode;Wheelchair (measurements OT);Wheelchair cushion (measurements OT) (to be continually assessed and updated)     Recommendations for Other Services Rehab consult     Precautions / Restrictions Precautions Precautions: Fall Restrictions Weight Bearing Restrictions: No      Mobility Bed Mobility Overal bed mobility: Needs Assistance Bed Mobility: Supine to Sit;Sit to Supine     Supine to sit: Total assist;HOB elevated;+2 for safety/equipment Sit to supine: Total assist;+2 for safety/equipment   General bed mobility comments: Pt agreeable to sit EOB, cued to move LEs off of bed but pt with difficulty initiating movement requiring Total A for follow-through    Transfers Overall transfer level: Needs assistance Equipment used: 2 person hand held assist Transfers: Sit to/from Stand Sit to Stand: Total assist;+2 physical assistance;+2 safety/equipment;From elevated surface         General transfer comment: Total A x 2 for sit to stand attempt from bedside via manual assist from therapists. Pt unable to tuck posterior region in to achieve full upright positioning. Unable to side step at bedside    Balance Overall balance assessment: Needs assistance Sitting-balance support: Feet supported;Bilateral upper extremity supported Sitting balance-Leahy Scale: Poor Sitting balance - Comments: requires at least Min A to maintain balance sititng EOB Postural control: Posterior lean;Left lateral lean Standing balance support: During functional activity Standing balance-Leahy Scale: Zero                             ADL either performed or assessed with clinical judgement   ADL Overall ADL's : Needs assistance/impaired Eating/Feeding: Set up;Sitting Eating/Feeding Details (indicate cue type and reason): assist to open containers. pt able to self feed after setup. difficulty locating coffee cup on table, placing juice back in correct  spot (visual or perception issues?) Grooming: Set up;Bed level;Wash/dry face Grooming Details (indicate cue type and reason): able to wash face with  extra time Upper Body Bathing: Maximal assistance;Bed level   Lower Body Bathing: Total assistance;+2 for physical assistance;+2 for safety/equipment;Sit to/from stand   Upper Body Dressing : Maximal assistance;Bed level   Lower Body Dressing: Total assistance;+2 for physical assistance;+2 for safety/equipment;Sit to/from stand   Toilet Transfer: Total assistance;+2 for physical assistance;+2 for safety/equipment   Toileting- Clothing Manipulation and Hygiene: Total assistance;Bed level Toileting - Clothing Manipulation Details (indicate cue type and reason): Total A for cleanup bed level after bowel incontinence       General ADL Comments: Pt with noted difficulty in cognition including motor planning, attention, self awareness, and processing. Pt with mild tone noted in extremities though may be improving.     Vision Baseline Vision/History: Wears glasses Wears Glasses: Reading only Patient Visual Report: Other (comment) (reports no change from baseline) Vision Assessment?: Vision impaired- to be further tested in functional context Additional Comments: reports no visual difficulties but noted issues locating items on tray table, obtaining items, finding call button light though pt able to read wall clock in room     Perception Perception Perception Tested?: Yes Comments: To be further assessed, some difficulty locating objects on tray table   Praxis Praxis Praxis tested?: Deficits Deficits: Initiation;Ideomotor;Perseveration Praxis-Other Comments: slow processing and difficulty with motor planning, unable to initiate movement voluntarily.holds to bed rail or therapist arm for extended period - unable to remove hand on command. to be further asessed    Pertinent Vitals/Pain Pain Assessment: Faces Faces Pain Scale: Hurts a little bit Pain Location: LEs, generalized Pain Descriptors / Indicators: Grimacing;Guarding Pain Intervention(s): Monitored during session;Repositioned      Hand Dominance Right   Extremity/Trunk Assessment Upper Extremity Assessment Upper Extremity Assessment: RUE deficits/detail;LUE deficits/detail RUE Deficits / Details: mild tightness with elbow extension. able to complete opposition, didactic movements in bed LUE Deficits / Details: decreased AROM of shoulder, PROM WFL. tightness/tone noted with elbow extension. able to complete opposition, didactic movements in bed   Lower Extremity Assessment Lower Extremity Assessment: Defer to PT evaluation   Cervical / Trunk Assessment Cervical / Trunk Assessment: Other exceptions Cervical / Trunk Exceptions: increased truncal tone requires increase time and gentle assist to achieve upright. Left lateral sway   Communication Communication Communication: No difficulties   Cognition Arousal/Alertness: Awake/alert Behavior During Therapy: WFL for tasks assessed/performed;Flat affect Overall Cognitive Status: Impaired/Different from baseline Area of Impairment: Attention;Awareness;Problem solving;Safety/judgement;Following commands;Memory;Orientation                 Orientation Level: Disoriented to;Situation Current Attention Level: Selective Memory: Decreased short-term memory Following Commands: Follows one step commands inconsistently;Follows one step commands with increased time;Follows multi-step commands inconsistently Safety/Judgement: Decreased awareness of deficits;Decreased awareness of safety Awareness: Emergent Problem Solving: Slow processing;Decreased initiation;Difficulty sequencing;Requires verbal cues;Requires tactile cues General Comments: Pt answered location, date accurately but when reporting situation that led to admission - was referring to watching people ride horses and experienced a fall. Per chart, pt was found down at home. Pt with decreased attention to tasks and difficulty in distracting environment. Able to answer questions, converse appropriately  statically in bed but when it came to movement/getting EOB, increased difficulty following commands, motor planning issues noted and slower processing.   General Comments  Continuous EEG on during session - checked in with RN prior to session.    Exercises     Shoulder Instructions  Home Living Family/patient expects to be discharged to:: Private residence Living Arrangements: Alone Available Help at Discharge: Family Type of Home: House Home Access: Stairs to enter Secretary/administrator of Steps: 1   Home Layout: One level     Bathroom Shower/Tub: Chief Strategy Officer: Standard     Home Equipment: Hand held shower head          Prior Functioning/Environment Level of Independence: Independent with assistive device(s);Needs assistance  Gait / Transfers Assistance Needed: used Rollator for ambulation or in home used furniture for support, pt's daughter reports her gait has been getting progressively worse since the sudden onset of confusion (and apparent tone?) in June ADL's / Homemaking Assistance Needed: reports independence in self care but admits difficulty getting in and out of shower, daughter stopped taking her shopping about 4 months ago, able to fix premade meals, no longer cooks,   Comments: Daughter [name] filled in gaps in information        OT Problem List: Decreased strength;Decreased range of motion;Decreased activity tolerance;Impaired balance (sitting and/or standing);Impaired vision/perception;Decreased coordination;Decreased cognition;Decreased safety awareness;Decreased knowledge of use of DME or AE;Impaired tone      OT Treatment/Interventions: Self-care/ADL training;Therapeutic exercise;DME and/or AE instruction;Therapeutic activities;Patient/family education;Balance training    OT Goals(Current goals can be found in the care plan section) Acute Rehab OT Goals Patient Stated Goal: eat breakfast, be able to walk again OT Goal  Formulation: With patient Time For Goal Achievement: 01/14/21 Potential to Achieve Goals: Good ADL Goals Pt Will Perform Grooming: with min assist;standing Pt Will Perform Upper Body Dressing: with min assist;sitting Pt Will Perform Lower Body Dressing: with mod assist;sitting/lateral leans;sit to/from stand Pt Will Transfer to Toilet: with min assist;stand pivot transfer;bedside commode Pt Will Perform Toileting - Clothing Manipulation and hygiene: sitting/lateral leans;sit to/from stand;with mod assist Additional ADL Goal #1: Pt to demonstrate ability to follow one step commands during ADLs > 75% of the time Additional ADL Goal #2: Pt to demonstrate ability to locate and name 5/5 ADL items without verbal cues during tasks for further visual/cognitive assessment  OT Frequency: Min 2X/week   Barriers to D/C:            Co-evaluation PT/OT/SLP Co-Evaluation/Treatment: Yes Reason for Co-Treatment: Complexity of the patient's impairments (multi-system involvement);Necessary to address cognition/behavior during functional activity;For patient/therapist safety;To address functional/ADL transfers   OT goals addressed during session: ADL's and self-care;Strengthening/ROM      AM-PAC OT "6 Clicks" Daily Activity     Outcome Measure Help from another person eating meals?: A Little Help from another person taking care of personal grooming?: A Little Help from another person toileting, which includes using toliet, bedpan, or urinal?: Total Help from another person bathing (including washing, rinsing, drying)?: Total Help from another person to put on and taking off regular upper body clothing?: A Lot Help from another person to put on and taking off regular lower body clothing?: Total 6 Click Score: 11   End of Session Equipment Utilized During Treatment: Gait belt Nurse Communication: Mobility status;Other (comment) (cognition, motor planning)  Activity Tolerance: Patient tolerated  treatment well Patient left: in bed;with call bell/phone within reach;with bed alarm set  OT Visit Diagnosis: Unsteadiness on feet (R26.81);Other abnormalities of gait and mobility (R26.89);Muscle weakness (generalized) (M62.81);History of falling (Z91.81);Other symptoms and signs involving cognitive function;Apraxia (R48.2);Ataxia, unspecified (R27.0)                Time: 9935-7017 OT Time Calculation (min): 42 min Charges:  OT General Charges $OT Visit: 1 Visit OT Evaluation $OT Eval Moderate Complexity: 1 Mod OT Treatments $Self Care/Home Management : 8-22 mins  Bradd CanaryJulie B, OTR/L Acute Rehab Services Office: [phone]  Lorre MunroeJulie  [place] 12/31/2020, 10:06 AM

## 2020-12-31 NOTE — TOC Initial Note (Signed)
Transition of Care [facility]) - Initial/Assessment Note    Patient Details  Name: Hannah Fisher MRN: [id number] Date of Birth: [birth year]  Transition of Care [facility]) CM/SW Contact:    Mearl Latin, LCSW Phone Number: 12/31/2020, 12:38 PM  Clinical Narrative:                 CSW received consult for possible SNF placement at time of discharge in the event CIR cannot accept patient. CSW spoke with patient's daughter, [name]. She reported that patient lives alone. Patient expressed understanding of PT recommendation and may be agreeable to SNF placement at time of discharge, but is still hopeful that CIR is an option. CSW discussed insurance authorization process and will provide Medicare SNF ratings list once offers are available. Patient has received two doses of the COVID vaccines. No further questions reported at this time.      Barriers to Discharge: Insurance Authorization,Continued Medical Work up   Patient Goals and CMS Choice Patient states their goals for this hospitalization and ongoing recovery are:: Rehab CMS Medicare.gov Compare Post Acute Care list provided to:: Patient Represenative (must comment) Choice offered to / list presented to : Adult Children  Expected Discharge Plan and Services   In-house Referral: Clinical Social Work   Post Acute Care Choice: IP Rehab,Skilled Nursing Facility,Home Health Living arrangements for the past 2 months: Single Family Home                                      Prior Living Arrangements/Services Living arrangements for the past 2 months: Single Family Home Lives with:: Self Patient language and need for interpreter reviewed:: Yes Do you feel safe going back to the place where you live?: Yes      Need for Family Participation in Patient Care: Yes (Comment) Care giver support system in place?: Yes (comment)   Criminal Activity/Legal Involvement Pertinent to Current Situation/Hospitalization: No - Comment as  needed  Activities of Daily Living      Permission Sought/Granted Permission sought to share information with : Facility Contact Representative,Family Supports Permission granted to share information with : Yes, Verbal Permission Granted  Share Information with NAME: [name]  Permission granted to share info w AGENCY: SNFs  Permission granted to share info w Relationship: Daughter  Permission granted to share info w Contact Information: [phone]  Emotional Assessment Appearance:: Appears stated age Attitude/Demeanor/Rapport: Unable to Assess Affect (typically observed): Unable to Assess Orientation: : Oriented to Self Alcohol / Substance Use: Not Applicable Psych Involvement: No (comment)  Admission diagnosis:  Metabolic acidosis [E87.2] Elevated LFTs [R79.89] Acute encephalopathy [G93.40] AKI (acute kidney injury) (HCC) [N17.9] Foreign body (FB) in soft tissue [M79.5] Traumatic rhabdomyolysis, initial encounter (HCC) [T79.6XXA] Altered mental status, unspecified altered mental status type [R41.82] Patient Active Problem List   Diagnosis Date Noted  . Acute encephalopathy 12/28/2020  . Rhabdomyolysis 12/28/2020  . Deafness   . Shingles   . Osteoporosis   . Hypertension    PCP:  Daisy Floro, MD Pharmacy:   [facility] [place], [place] - [address] [place] [address] Phone: [phone] Fax: [phone]     Social Determinants of Health (SDOH) Interventions    Readmission Risk Interventions No flowsheet data found.

## 2020-12-31 NOTE — Progress Notes (Signed)
PROGRESS NOTE    Hannah Fisher  WNU:[id number] DOB: 01-02-1951 DOA: 12/28/2020 PCP: Daisy Floro, MD    Brief Narrative:  70 year old female with a history of hypertension, admitted to the hospital after she was found laying on the floor for unknown period of time.  She was noted to be somnolent and confused.  Work-up including MRI imaging, metabolic work-up has been unrevealing.  She was noted to have rhabdomyolysis and acute kidney injury.  She is receiving IV fluids.  Holding sedating medications.  EEG abnormal with possible epileptiform discharges in left temporal area. Neurology consulted. Continue to follow mental status.   Assessment & Plan:   Principal Problem:   Acute encephalopathy Active Problems:   Rhabdomyolysis   Acute encephalopathy -etiology is unclear, may possibly be related to renal failure/dehydration -Daughter feels it is unlikely that patient took excess medications -MRI brain does not show any acute intracranial abnormality -Urinalysis did not show any signs of infection, urine culture negative -TSH, ammonia, B12 are unremarkable -EEG shows possible epileptiform discharges in the left and possibly right temporal areas -appreciate neurology assistance -started on keppra -started on LTM EEG monitoring -Currently, she is awake, but remains confused. -with fall and head injury, may have an element of post concussive syndrome -Continue to monitor  Acute kidney injury -Suspect related to dehydration in the setting of ACE inhibitor/thiazide use as well as rhabdomyolysis -Resolved with hydration  Urinary retention -had foley catheter placed overnight -abd xray with constipation which may be contributing -will start on bethanecol and tamsulosin -can consider voiding trial tomorrow  Leukocytosis -Possibly related to hemoconcentration -No obvious sources of infection at this time -Resolved with IV fluids -Continue to  monitor  Hypertension -Holding ACE inhibitor as well as thiazide due to renal failure, blood pressure currently stable  Acute traumatic rhabdomyolysis -Secondary to fall -recent CK level of 6268, repeat level from this morning 2150 -continue IV fluids -Monitor urine output  Fall and deconditioning -Daughter reports that patient has been falling more frequently -She does have some pain in her hips bilaterally, xray of pelvis unremarkable -Physical therapy evaluation with recommendations for CIR  Elevated LFTs -Imaging of gallbladder unrevealing -Suspect this is related to rhabdomyolysis -Hepatitis panel negative -No abdominal tenderness on exam  Hypokalemia -Replaced -Magnesium normal  Hypophosphatemia -replace  History of chronic weakness of upper and lower extremities -B12 is normal -She is followed by an outpatient neurologist  Constipation -noted on abd xray -start on daily miralax   DVT prophylaxis: heparin injection 5,000 Units Start: 12/29/20 1630 SCDs Start: 12/28/20 2226  Heparin Klein 5000 units TID  Code Status: full code Family Communication: discussed with daughter over the phone Disposition Plan: Status is: Inpatient  The patient will require care spanning > 2 midnights and should be moved to inpatient because: Altered mental status  Dispo: The patient is from: Home              Anticipated d/c is to: TBD              Patient currently is not medically stable to d/c.   Difficult to place patient No  Consultants:   Neurology  Procedures:  EEG: This is an abnormal waiting and drowsy study due to 1) left temporal (and possibly very occasionally right temporal) slowing and definitively epileptiform discharges (with an aftergoing slow wave distinguishing from wickets).  This finding would be suggestive of an epileptiform foci in the left temporal area (possibly relatively deep with occasional representation in  the contralateral hemisphere on surface  EEG).    This is superimposed upon diminished background organization as described above, most clinically compatible with a mild diffuse encephalopathy.    There were no seizures however.    Antimicrobials:       Subjective: Patient is sitting up in bed, she wakes up to voice. [facility]. Tends to get confused with further conversation  Objective: Vitals:   12/30/20 1401 12/30/20 1630 12/30/20 2000 12/31/20 0500  BP: 119/73 128/77 122/73 132/81  Pulse: 96 93 98 94  Resp: 17 15 18 16   Temp: 98.9 F (37.2 C) 98.6 F (37 C) 98 F (36.7 C) 98.2 F (36.8 C)  TempSrc: Oral Axillary Oral Oral  SpO2: 99% 97% 96%   Weight:      Height:        Intake/Output Summary (Last 24 hours) at 12/31/2020 1218 Last data filed at 12/31/2020 1010 Gross per 24 hour  Intake 1204.48 ml  Output 1100 ml  Net 104.48 ml   Filed Weights   12/28/20 1612  Weight: 43.1 kg    Examination:  General exam: Alert, awake, oriented x 3 Respiratory system: Clear to auscultation. Respiratory effort normal. Cardiovascular system:RRR. No murmurs, rubs, gallops. Gastrointestinal system: Abdomen is nondistended, soft and nontender. No organomegaly or masses felt. Normal bowel sounds heard. Central nervous system: Alert and oriented. No focal neurological deficits. Extremities: No C/C/E, +pedal pulses Skin: No rashes, lesions or ulcers Psychiatry: calm, pleasant, still seems to be confused at times      Data Reviewed: I have personally reviewed following labs and imaging studies  CBC: Recent Labs  Lab 12/28/20 1631 12/29/20 0311 12/30/20 0043 12/31/20 0019  WBC 21.8* 19.5* 13.2* 8.5  NEUTROABS 19.6*  --  11.7*  --   HGB 14.1 12.5 11.4* 10.2*  HCT 43.0 37.4 34.1* 30.9*  MCV 93.5 92.1 91.9 94.2  PLT 350 281 227 188   Basic Metabolic Panel: Recent Labs  Lab 12/28/20 1631 12/29/20 0311 12/30/20 0043 12/30/20 2005 12/31/20 0019  NA 141 142 146*  --  139  K 3.2* 3.3*  3.6  --  3.1*  CL 107 110 108  --  102  CO2 15* 19* 32  --  32  GLUCOSE 155* 127* 172*  --  112*  BUN 58* 52* 35*  --  20  CREATININE 1.27* 1.11* 0.79  --  0.59  CALCIUM 9.5 9.4 9.1  --  8.4*  MG  --   --  2.6*  --   --   PHOS  --   --  1.1* 2.8 2.3*   GFR: Estimated Creatinine Clearance: 44.5 mL/min (by C-G formula based on SCr of 0.59 mg/dL). Liver Function Tests: Recent Labs  Lab 12/28/20 1631 12/29/20 0311 12/30/20 0043 12/31/20 0019  AST 142* 171*  --   --   ALT 66* 74*  --   --   ALKPHOS 69 59  --   --   BILITOT 1.6* 1.5*  --   --   PROT 6.7 5.5*  --   --   ALBUMIN 4.1 3.3* 2.8* 2.5*   No results for input(s): LIPASE, AMYLASE in the last 168 hours. Recent Labs  Lab 12/29/20 0311  AMMONIA 32   Coagulation Profile: No results for input(s): INR, PROTIME in the last 168 hours. Cardiac Enzymes: Recent Labs  Lab 12/28/20 1631 12/29/20 0311 12/30/20 0043 12/31/20 0019  CKTOTAL 5,980* 6,268* 3,843* 2,150*   BNP (  last 3 results) No results for input(s): PROBNP in the last 8760 hours. HbA1C: No results for input(s): HGBA1C in the last 72 hours. CBG: Recent Labs  Lab 12/28/20 1741  GLUCAP 140*   Lipid Profile: No results for input(s): CHOL, HDL, LDLCALC, TRIG, CHOLHDL, LDLDIRECT in the last 72 hours. Thyroid Function Tests: Recent Labs    12/28/20 2140  TSH 0.436   Anemia Panel: Recent Labs    12/28/20 2140  VITAMINB12 644   Sepsis Labs: Recent Labs  Lab 12/28/20 2140 12/28/20 2352  LATICACIDVEN 2.6* 1.2    Recent Results (from the past 240 hour(s))  Culture, blood (routine x 2)     Status: None (Preliminary result)   Collection Time: 12/28/20  9:40 PM   Specimen: BLOOD  Result Value Ref Range Status   Specimen Description BLOOD SITE NOT SPECIFIED  Final   Special Requests   Final    BOTTLES DRAWN AEROBIC AND ANAEROBIC Blood Culture adequate volume   Culture   Final    NO GROWTH 2 DAYS Performed at [facility], [address], [place], [place] [address]    Report Status PENDING  Incomplete  Culture, blood (routine x 2)     Status: None (Preliminary result)   Collection Time: 12/28/20  9:40 PM   Specimen: BLOOD  Result Value Ref Range Status   Specimen Description BLOOD SITE NOT SPECIFIED  Final   Special Requests   Final    BOTTLES DRAWN AEROBIC AND ANAEROBIC Blood Culture adequate volume   Culture   Final    NO GROWTH 2 DAYS Performed at [facility], [address], [place], [place] [address]    Report Status PENDING  Incomplete  Resp Panel by RT-PCR (Flu A&B, Covid) Nasopharyngeal Swab     Status: None   Collection Time: 12/28/20 10:10 PM   Specimen: Nasopharyngeal Swab; Nasopharyngeal(NP) swabs in vial transport medium  Result Value Ref Range Status   SARS Coronavirus 2 by RT PCR NEGATIVE NEGATIVE Final    Comment: (NOTE) SARS-CoV-2 target nucleic acids are NOT DETECTED.  The SARS-CoV-2 RNA is generally detectable in upper respiratory specimens during the acute phase of infection. The lowest concentration of SARS-CoV-2 viral copies this assay can detect is 138 copies/mL. A negative result does not preclude SARS-Cov-2 infection and should not be used as the sole basis for treatment or other patient management decisions. A negative result may occur with  improper specimen collection/handling, submission of specimen other than nasopharyngeal swab, presence of viral mutation(s) within the areas targeted by this assay, and inadequate number of viral copies(<138 copies/mL). A negative result must be combined with clinical observations, patient history, and epidemiological information. The expected result is Negative.  Fact Sheet for Patients:  BloggerCourse.com  Fact Sheet for Healthcare Providers:  SeriousBroker.it  This test is no t yet approved or cleared by the [place] FDA and  has been authorized for detection and/or diagnosis of  SARS-CoV-2 by FDA under an Emergency Use Authorization (EUA). This EUA will remain  in effect (meaning this test can be used) for the duration of the COVID-19 declaration under Section 564(b)(1) of the Act, 21 U.S.C.section 360bbb-3(b)(1), unless the authorization is terminated  or revoked sooner.       Influenza A by PCR NEGATIVE NEGATIVE Final   Influenza B by PCR NEGATIVE NEGATIVE Final    Comment: (NOTE) The Xpert Xpress SARS-CoV-2/FLU/RSV plus assay is intended as an aid in the diagnosis of influenza  from Nasopharyngeal swab specimens and should not be used as a sole basis for treatment. Nasal washings and aspirates are unacceptable for Xpert Xpress SARS-CoV-2/FLU/RSV testing.  Fact Sheet for Patients: BloggerCourse.com  Fact Sheet for Healthcare Providers: SeriousBroker.it  This test is not yet approved or cleared by the [place] FDA and has been authorized for detection and/or diagnosis of SARS-CoV-2 by FDA under an Emergency Use Authorization (EUA). This EUA will remain in effect (meaning this test can be used) for the duration of the COVID-19 declaration under Section 564(b)(1) of the Act, 21 U.S.C. section 360bbb-3(b)(1), unless the authorization is terminated or revoked.  Performed at [facility], [address], [place], [place] [address]   Culture, Urine     Status: None   Collection Time: 12/30/20  5:45 AM   Specimen: Urine, Catheterized  Result Value Ref Range Status   Specimen Description URINE, CATHETERIZED  Final   Special Requests NONE  Final   Culture   Final    NO GROWTH Performed at [facility], [address] [place], [place] [address]    Report Status 12/31/2020 FINAL  Final         Radiology Studies: DG Pelvis Portable  Result Date: 12/29/2020 CLINICAL DATA:  Fall. EXAM: PORTABLE PELVIS 1-2 VIEWS COMPARISON:  None. FINDINGS: The cortical margins of the bony pelvis are  intact. No fracture. Pubic symphysis and sacroiliac joints are congruent. Both femoral heads are well-seated in the respective acetabula. IMPRESSION: No pelvic fracture. Electronically Signed   By: Narda Rutherford M.D.   On: 12/29/2020 16:55   DG Abd Portable 1V  Result Date: 12/30/2020 CLINICAL DATA:  Urinary retention EXAM: PORTABLE ABDOMEN - 1 VIEW COMPARISON:  December 29, 2020 FINDINGS: Moderate air noted in small bowel. No bowel dilatation or air-fluid level to suggest bowel obstruction. No free air. There is fairly diffuse stool throughout colon. No abnormal calcifications. IMPRESSION: Fairly diffuse stool throughout colon. There may be a degree of underlying constipation. No bowel obstruction or free air evident. Electronically Signed   By: Bretta Bang III M.D.   On: 12/30/2020 12:25   EEG adult  Result Date: 12/29/2020 Lilian Coma, MD     12/29/2020  7:09 PM EEG Report Indication: possible seizure activity This study was recorded in the waiting and drowsy state.  The duration of the study was 26 minutes.  Electrodes were placed according to the International 10/20 system.  Video was reviewed/available for clinical correlation as needed. In the waking state, discernible but diminished background organization is seen with a mildly attenuated anterior - posterior voltage and frequency gradient.  In the occipital leads there was a symmetric and reactive although indistinct and poorly sustained posterior dominant rhythm of approximately 7 hertz, which is slower than expected for age  Anteriorly, is the expected pattern of faster frequency, lower voltage waveforms, although in a reduced amount as compared to normal. During the drowsy state, there is further attenuation of the gradient, a general shift to slower frequencies diffusely, a waxing and waning of the posterior dominant rhythm, and slow roving eye movements. There were intermittently typically isolated Spike waveforms seen in the left  temporal region, and very occasionally the right. In addition, there is some degree of slowing in the bitemporal regions, although to a very modest degree.  On occasion the spike waves have an aftergoing slow wave (e.g.,  at 11:37:01 of the recording)).  These are strongly favored to be epileptiform discharges. Hyperventilation:  deferred Photic stimulation: deferred Impression: This is an abnormal waiting and drowsy study due to 1) left temporal (and possibly very occasionally right temporal) slowing and definitively epileptiform discharges (with an aftergoing slow wave distinguishing from wickets). This finding would be suggestive of an epileptiform foci in the left temporal area (possibly relatively deep with occasional representation in the contralateral hemisphere on surface EEG).  This is superimposed upon diminished background organization as described above, most clinically compatible with a mild diffuse encephalopathy.  There were no seizures however.        Scheduled Meds: . Chlorhexidine Gluconate Cloth  6 each Topical Daily  . heparin injection (subcutaneous)  5,000 Units Subcutaneous Q8H  . levETIRAcetam  500 mg Oral BID  . polyethylene glycol  17 g Oral Daily  . tamsulosin  0.4 mg Oral Daily   Continuous Infusions: . lactated ringers 75 mL/hr at 12/31/20 0515  . potassium PHOSPHATE IVPB (in mmol) 15 mmol (12/31/20 1045)     LOS: 2 days    Time spent:    Erick Blinks, MD [facility]   If 7PM-7AM, please contact night-coverage [email or web address]  12/31/2020, 12:18 PM

## 2021-01-01 ENCOUNTER — Inpatient Hospital Stay (HOSPITAL_COMMUNITY): Payer: Medicare Other

## 2021-01-01 DIAGNOSIS — E872 Acidosis: Secondary | ICD-10-CM | POA: Diagnosis not present

## 2021-01-01 DIAGNOSIS — G9340 Encephalopathy, unspecified: Secondary | ICD-10-CM | POA: Diagnosis not present

## 2021-01-01 DIAGNOSIS — N179 Acute kidney failure, unspecified: Secondary | ICD-10-CM | POA: Diagnosis not present

## 2021-01-01 DIAGNOSIS — R7989 Other specified abnormal findings of blood chemistry: Secondary | ICD-10-CM | POA: Diagnosis not present

## 2021-01-01 LAB — RENAL FUNCTION PANEL
Albumin: 2.2 g/dL — ABNORMAL LOW (ref 3.5–5.0)
Anion gap: 7 (ref 5–15)
BUN: 15 mg/dL (ref 8–23)
CO2: 25 mmol/L (ref 22–32)
Calcium: 8.4 mg/dL — ABNORMAL LOW (ref 8.9–10.3)
Chloride: 102 mmol/L (ref 98–111)
Creatinine, Ser: 0.54 mg/dL (ref 0.44–1.00)
GFR, Estimated: >60 mL/min (ref >60)
Glucose, Bld: 111 mg/dL — ABNORMAL HIGH (ref 70–99)
Phosphorus: 2.4 mg/dL — ABNORMAL LOW (ref 2.5–4.6)
Potassium: 3.3 mmol/L — ABNORMAL LOW (ref 3.5–5.1)
Sodium: 134 mmol/L — ABNORMAL LOW (ref 135–145)

## 2021-01-01 LAB — HEMOGLOBIN A1C
Hgb A1c MFr Bld: 5.4 % (ref 4.8–5.6)
Mean Plasma Glucose: 108.28 mg/dL

## 2021-01-01 LAB — CK: Total CK: 870 U/L — ABNORMAL HIGH (ref 38–234)

## 2021-01-01 MED ORDER — ENSURE ENLIVE PO LIQD
237.0000 mL | Freq: Two times a day (BID) | ORAL | Status: DC
  Administered 2021-01-01 – 2021-01-04 (×6): 237 mL via ORAL

## 2021-01-01 MED ORDER — BENZOCAINE 10 % MT GEL
Freq: Four times a day (QID) | OROMUCOSAL | Status: DC | PRN
  Administered 2021-01-01: 2 via OROMUCOSAL
  Filled 2021-01-01: qty 9

## 2021-01-01 MED ORDER — POTASSIUM PHOSPHATES 15 MMOLE/5ML IV SOLN
15.0000 mmol | Freq: Once | INTRAVENOUS | Status: AC
  Administered 2021-01-01: 15 mmol via INTRAVENOUS
  Filled 2021-01-01: qty 5

## 2021-01-01 MED ORDER — TERBINAFINE HCL 1 % EX CREA
TOPICAL_CREAM | Freq: Two times a day (BID) | CUTANEOUS | Status: DC
  Administered 2021-01-01: 2 via TOPICAL
  Filled 2021-01-01 (×2): qty 12

## 2021-01-01 MED ORDER — SORBITOL 70 % SOLN
960.0000 mL | TOPICAL_OIL | Freq: Once | ORAL | Status: AC
  Administered 2021-01-01: 960 mL via RECTAL
  Filled 2021-01-01: qty 473

## 2021-01-01 MED ORDER — ADULT MULTIVITAMIN W/MINERALS CH
1.0000 | ORAL_TABLET | Freq: Every day | ORAL | Status: DC
  Administered 2021-01-01 – 2021-01-04 (×4): 1 via ORAL
  Filled 2021-01-01 (×4): qty 1

## 2021-01-01 NOTE — Progress Notes (Signed)
PROGRESS NOTE    Hannah Fisher  VZC:[id number] DOB: [birth year] DOA: 12/28/2020 PCP: Daisy Floro, MD    Brief Narrative:  70 year old female with a history of hypertension, admitted to the hospital after she was found laying on the floor for unknown period of time.  She was noted to be somnolent and confused.  Work-up including MRI imaging, metabolic work-up has been unrevealing.  She was noted to have rhabdomyolysis and acute kidney injury.  She is receiving IV fluids.  Holding sedating medications.  EEG abnormal with possible epileptiform discharges in left temporal area. Neurology consulted. Continue to follow mental status.   Assessment & Plan:   Principal Problem:   Acute encephalopathy Active Problems:   Rhabdomyolysis   Acute encephalopathy -etiology is unclear, may possibly be related to renal failure/dehydration -Daughter feels it is unlikely that patient took excess medications -MRI brain does not show any acute intracranial abnormality -Urinalysis did not show any signs of infection, urine culture negative -TSH, ammonia, B12 are unremarkable -EEG shows possible epileptiform discharges in the left and possibly right temporal areas -appreciate neurology assistance -started on keppra -started on LTM EEG monitoring -Overall mental status appears to be improving -with fall and head injury, may have an element of post concussive syndrome -Continue to monitor  Acute kidney injury -Suspect related to dehydration in the setting of ACE inhibitor/thiazide use as well as rhabdomyolysis -Resolved with hydration  Urinary retention -had foley catheter placed  -abd xray with constipation which may be contributing -started on bethanecol and tamsulosin -will attempt voiding trial today  Leukocytosis -Possibly related to hemoconcentration -No obvious sources of infection at this time -Resolved with IV fluids -Continue to monitor  Hypertension -Holding ACE inhibitor  as well as thiazide due to renal failure, blood pressure currently stable  Acute traumatic rhabdomyolysis -Secondary to fall -recent CK level of 6268, repeat level from this morning 870 -continue IV fluids -Monitor urine output  Fall and deconditioning -Daughter reports that patient has been falling more frequently -She does have some pain in her hips bilaterally, xray of pelvis unremarkable -since she is still having pain, will check dedicated left hip xray -Physical therapy evaluation with recommendations for CIR  Elevated LFTs -Imaging of gallbladder unrevealing -Suspect this is related to rhabdomyolysis -Hepatitis panel negative -No abdominal tenderness on exam  Hypokalemia -Replaced -Magnesium normal  Hypophosphatemia -replace  History of chronic weakness of upper and lower extremities -B12 is normal -She is followed by an outpatient neurologist  Constipation -noted on abd xray -start on daily miralax -will give enema today   DVT prophylaxis: heparin injection 5,000 Units Start: 12/29/20 1630 SCDs Start: 12/28/20 2226  Heparin [place] 5000 units TID  Code Status: full code Family Communication: discussed with daughter over the phone Disposition Plan: Status is: Inpatient  The patient will require care spanning > 2 midnights and should be moved to inpatient because: Altered mental status  Dispo: The patient is from: Home              Anticipated d/c is to: TBD              Patient currently is not medically stable to d/c.   Difficult to place patient No  Consultants:   Neurology  Procedures:  EEG: This is an abnormal waiting and drowsy study due to 1) left temporal (and possibly very occasionally right temporal) slowing and definitively epileptiform discharges (with an aftergoing slow wave distinguishing from wickets).  This finding would be suggestive of  an epileptiform foci in the left temporal area (possibly relatively deep with occasional representation  in the contralateral hemisphere on surface EEG).    This is superimposed upon diminished background organization as described above, most clinically compatible with a mild diffuse encephalopathy.    There were no seizures however.    Antimicrobials:       Subjective: Sitting up in bed. Feels as though she has had trouble moving her bowels. No shortness of breath. Seems more awake and alert today  Objective: Vitals:   12/31/20 1500 12/31/20 2000 01/01/21 0000 01/01/21 0538  BP: (!) 147/84 (!) 158/94 (!) 144/87 (!) 156/87  Pulse: 92 83 88 98  Resp: 16 14 14 17   Temp: 97.7 F (36.5 C) 99 F (37.2 C)  98.9 F (37.2 C)  TempSrc: Oral Axillary  Axillary  SpO2: 96% 97% 96% 96%  Weight:      Height:        Intake/Output Summary (Last 24 hours) at 01/01/2021 1107 Last data filed at 01/01/2021 0540 Gross per 24 hour  Intake 2191.6 ml  Output 1075 ml  Net 1116.6 ml   Filed Weights   12/28/20 1612  Weight: 43.1 kg    Examination:  General exam: Alert, awake, oriented x 3 Respiratory system: Clear to auscultation. Respiratory effort normal. Cardiovascular system:RRR. No murmurs, rubs, gallops. Gastrointestinal system: Abdomen is nondistended, soft and nontender. No organomegaly or masses felt. Normal bowel sounds heard. Central nervous system: Alert and oriented. No focal neurological deficits. Extremities: No C/C/E, +pedal pulses Skin: No rashes, lesions or ulcers Psychiatry: seems to be more clear, pleasant    Data Reviewed: I have personally reviewed following labs and imaging studies  CBC: Recent Labs  Lab 12/28/20 1631 12/29/20 0311 12/30/20 0043 12/31/20 0019  WBC 21.8* 19.5* 13.2* 8.5  NEUTROABS 19.6*  --  11.7*  --   HGB 14.1 12.5 11.4* 10.2*  HCT 43.0 37.4 34.1* 30.9*  MCV 93.5 92.1 91.9 94.2  PLT 350 281 227 188   Basic Metabolic Panel: Recent Labs  Lab 12/28/20 1631 12/29/20 0311 12/30/20 0043 12/30/20 2005 12/31/20 0019 01/01/21 0230  NA  141 142 146*  --  139 134*  K 3.2* 3.3* 3.6  --  3.1* 3.3*  CL 107 110 108  --  102 102  CO2 15* 19* 32  --  32 25  GLUCOSE 155* 127* 172*  --  112* 111*  BUN 58* 52* 35*  --  20 15  CREATININE 1.27* 1.11* 0.79  --  0.59 0.54  CALCIUM 9.5 9.4 9.1  --  8.4* 8.4*  MG  --   --  2.6*  --   --   --   PHOS  --   --  1.1* 2.8 2.3* 2.4*   GFR: Estimated Creatinine Clearance: 44.5 mL/min (by C-G formula based on SCr of 0.54 mg/dL). Liver Function Tests: Recent Labs  Lab 12/28/20 1631 12/29/20 0311 12/30/20 0043 12/31/20 0019 01/01/21 0230  AST 142* 171*  --   --   --   ALT 66* 74*  --   --   --   ALKPHOS 69 59  --   --   --   BILITOT 1.6* 1.5*  --   --   --   PROT 6.7 5.5*  --   --   --   ALBUMIN 4.1 3.3* 2.8* 2.5* 2.2*   No results for input(s): LIPASE, AMYLASE in the last 168 hours. Recent Labs  Lab 12/29/20  5916  AMMONIA 32   Coagulation Profile: No results for input(s): INR, PROTIME in the last 168 hours. Cardiac Enzymes: Recent Labs  Lab 12/28/20 1631 12/29/20 0311 12/30/20 0043 12/31/20 0019 01/01/21 0230  CKTOTAL 5,980* 6,268* 3,843* 2,150* 870*   BNP (last 3 results) No results for input(s): PROBNP in the last 8760 hours. HbA1C: Recent Labs    01/01/21 0230  HGBA1C 5.4   CBG: Recent Labs  Lab 12/28/20 1741  GLUCAP 140*   Lipid Profile: No results for input(s): CHOL, HDL, LDLCALC, TRIG, CHOLHDL, LDLDIRECT in the last 72 hours. Thyroid Function Tests: No results for input(s): TSH, T4TOTAL, FREET4, T3FREE, THYROIDAB in the last 72 hours. Anemia Panel: No results for input(s): VITAMINB12, FOLATE, FERRITIN, TIBC, IRON, RETICCTPCT in the last 72 hours. Sepsis Labs: Recent Labs  Lab 12/28/20 2140 12/28/20 2352  LATICACIDVEN 2.6* 1.2    Recent Results (from the past 240 hour(s))  Culture, blood (routine x 2)     Status: None (Preliminary result)   Collection Time: 12/28/20  9:40 PM   Specimen: BLOOD  Result Value Ref Range Status   Specimen  Description BLOOD SITE NOT SPECIFIED  Final   Special Requests   Final    BOTTLES DRAWN AEROBIC AND ANAEROBIC Blood Culture adequate volume   Culture   Final    NO GROWTH 4 DAYS Performed at [facility], [address] [place], [place] [address]    Report Status PENDING  Incomplete  Culture, blood (routine x 2)     Status: None (Preliminary result)   Collection Time: 12/28/20  9:40 PM   Specimen: BLOOD  Result Value Ref Range Status   Specimen Description BLOOD SITE NOT SPECIFIED  Final   Special Requests   Final    BOTTLES DRAWN AEROBIC AND ANAEROBIC Blood Culture adequate volume   Culture   Final    NO GROWTH 4 DAYS Performed at [facility], [address] [place], [place] [address]    Report Status PENDING  Incomplete  Resp Panel by RT-PCR (Flu A&B, Covid) Nasopharyngeal Swab     Status: None   Collection Time: 12/28/20 10:10 PM   Specimen: Nasopharyngeal Swab; Nasopharyngeal(NP) swabs in vial transport medium  Result Value Ref Range Status   SARS Coronavirus 2 by RT PCR NEGATIVE NEGATIVE Final    Comment: (NOTE) SARS-CoV-2 target nucleic acids are NOT DETECTED.  The SARS-CoV-2 RNA is generally detectable in upper respiratory specimens during the acute phase of infection. The lowest concentration of SARS-CoV-2 viral copies this assay can detect is 138 copies/mL. A negative result does not preclude SARS-Cov-2 infection and should not be used as the sole basis for treatment or other patient management decisions. A negative result may occur with  improper specimen collection/handling, submission of specimen other than nasopharyngeal swab, presence of viral mutation(s) within the areas targeted by this assay, and inadequate number of viral copies(<138 copies/mL). A negative result must be combined with clinical observations, patient history, and epidemiological information. The expected result is Negative.  Fact Sheet for Patients:   BloggerCourse.com  Fact Sheet for Healthcare Providers:  SeriousBroker.it  This test is no t yet approved or cleared by the [place] FDA and  has been authorized for detection and/or diagnosis of SARS-CoV-2 by FDA under an Emergency Use Authorization (EUA). This EUA will remain  in effect (meaning this test can be used) for the duration of the COVID-19 declaration under Section 564(b)(1) of the Act, 21 U.S.C.section 360bbb-3(b)(1),  unless the authorization is terminated  or revoked sooner.       Influenza A by PCR NEGATIVE NEGATIVE Final   Influenza B by PCR NEGATIVE NEGATIVE Final    Comment: (NOTE) The Xpert Xpress SARS-CoV-2/FLU/RSV plus assay is intended as an aid in the diagnosis of influenza from Nasopharyngeal swab specimens and should not be used as a sole basis for treatment. Nasal washings and aspirates are unacceptable for Xpert Xpress SARS-CoV-2/FLU/RSV testing.  Fact Sheet for Patients: BloggerCourse.comhttps://www.fda.gov/media/152166/download  Fact Sheet for Healthcare Providers: SeriousBroker.ithttps://www.fda.gov/media/152162/download  This test is not yet approved or cleared by the [place] FDA and has been authorized for detection and/or diagnosis of SARS-CoV-2 by FDA under an Emergency Use Authorization (EUA). This EUA will remain in effect (meaning this test can be used) for the duration of the COVID-19 declaration under Section 564(b)(1) of the Act, 21 U.S.C. section 360bbb-3(b)(1), unless the authorization is terminated or revoked.  Performed at [facility], [address] [place], [place] [address]   Culture, Urine     Status: None   Collection Time: 12/30/20  5:45 AM   Specimen: Urine, Catheterized  Result Value Ref Range Status   Specimen Description URINE, CATHETERIZED  Final   Special Requests NONE  Final   Culture   Final    NO GROWTH Performed at [facility], [address] [place], [place]  [id number]    Report Status 12/31/2020 FINAL  Final         Radiology Studies: No results found.      Scheduled Meds: . bethanechol  5 mg Oral TID  . Chlorhexidine Gluconate Cloth  6 each Topical Daily  . heparin injection (subcutaneous)  5,000 Units Subcutaneous Q8H  . levETIRAcetam  500 mg Oral BID  . polyethylene glycol  17 g Oral Daily  . sorbitol, milk of mag, mineral oil, glycerin (SMOG) enema  960 mL Rectal Once  . tamsulosin  0.4 mg Oral Daily   Continuous Infusions: . lactated ringers 75 mL/hr at 01/01/21 0852  . potassium PHOSPHATE IVPB (in mmol) 15 mmol (01/01/21 1057)     LOS: 3 days    Time spent: 35mins    Erick BlinksJehanzeb Ashante Snelling, MD [facility]   If 7PM-7AM, please contact night-coverage [email or web address]  01/01/2021, 11:07 AM

## 2021-01-01 NOTE — Progress Notes (Signed)
NEUROLOGY CONSULTATION PROGRESS NOTE   Date of service: January 01, 2021 Patient Name: Hannah Fisher MRN:  [id number] DOB:  [birth year]  Brief HPI  Hannah Fisher is a 70 y.o. female found down, unknown duration but atleast 2 days. Imaging neg for stroke.EEG with left temporal slowing. In addition, found to have rhabdo with AKI and dehydrated. She was started on Keppra 500mg  BID.   Interval Hx   Awake, alert, poor recollection of events leading upto admission. Thinks she was with her daughter at a horse show and then something happened and ended up in the hospital. Still does not think that she drinks too much. Reports that she drinks a small tupperware of beer but then startes that she buys wine in 5L boxes and goes through it every 4-5 days.  Vitals   Vitals:   12/31/20 1500 12/31/20 2000 01/01/21 0000 01/01/21 0538  BP: (!) 147/84 (!) 158/94 (!) 144/87 (!) 156/87  Pulse: 92 83 88 98  Resp: 16 14 14 17   Temp: 97.7 F (36.5 C) 99 F (37.2 C)  98.9 F (37.2 C)  TempSrc: Oral Axillary  Axillary  SpO2: 96% 97% 96% 96%  Weight:      Height:         Body mass index is 17.95 kg/m.  Physical Exam   General: Laying comfortably in bed; in no acute distress.  HENT: Normal oropharynx and mucosa. Normal external appearance of ears and nose.  Neck: Supple, no pain or tenderness  CV: No JVD. No peripheral edema.  Pulmonary: Symmetric Chest rise. Normal respiratory effort.  Abdomen: Soft to touch, non-tender.  Ext: No cyanosis, edema, or deformity  Skin: No rash. Normal palpation of skin.   Musculoskeletal: Normal digits and nails by inspection. No clubbing.   Neurologic Examination  Mental status/Cognition: Alert, oriented to self, place, poor attention. ?confabulation. Speech/language: Fluent, comprehension intact, object naming intact, repetition intact.  Cranial nerves:   CN II Pupils equal and reactive to light, no VF deficits    CN III,IV,VI EOM intact, no gaze preference or  deviation, no nystagmus    CN V    CN VII no asymmetry, no nasolabial fold flattening    CN VIII normal hearing to speech    CN IX & X normal palatal elevation, no uvular deviation    CN XI    CN XII midline tongue protrusion    Motor:  Muscle bulk: normal, tone normal  Moves all extremities spontaneously and antigravity atleast.  Sensation:  Light touch intact   Pin prick    Temperature    Vibration   Proprioception    Coordination/Complex Motor:  - Finger to Nose intact BL. Mild asterixis but no obvious tremor.   Labs   Basic Metabolic Panel:  Lab Results  Component Value Date   NA 134 (L) 01/01/2021   K 3.3 (L) 01/01/2021   CO2 25 01/01/2021   GLUCOSE 111 (H) 01/01/2021   BUN 15 01/01/2021   CREATININE 0.54 01/01/2021   CALCIUM 8.4 (L) 01/01/2021   GFRNONAA >60 01/01/2021   GFRAA 88 02/02/2020   HbA1c:  Lab Results  Component Value Date   HGBA1C 5.4 01/01/2021   LDL:  Lab Results  Component Value Date   LDLCALC 87 03/03/2014   Urine Drug Screen: No results found for: LABOPIA, COCAINSCRNUR, LABBENZ, AMPHETMU, THCU, LABBARB  Alcohol Level No results found for: ETH No results found for: PHENYTOIN, ZONISAMIDE, LAMOTRIGINE, LEVETIRACETA No results found for: PHENYTOIN, PHENOBARB, VALPROATE,  CBMZ  Imaging and Diagnostic studies   MRI BRAIN WITHOUT CONTRAST No acute intracranial abnormality. Stable noncontrast MRI appearance of the brain since November with mild for age white matter changes, most commonly due to small vessel disease.  Impression   Hannah Fisher is a 71 y.o. female with encephalopathy likely due to a combination of seizure with post ictal period and potential head injury with scalp hematoma. AKI with rhabdomyolysis with dehydration likely due to a combination of above. She is significantly improved which is reassuring. Takes Vit B complex, so less concerned about thiamine deficiency.  rEEG with left temporal epileptiform foci. LTM with  diffuse background slowing, consistent with a non-specific encephalopathy pattern. No epileptiform discharges or seizures were seen. Reasonable to do Keppra given left temporal epileptiform discharges on initial EEG.  Recommendations  - Keppra 500mg  BID. - No driving until seizure free for 6 months. - No further inpatient workup needed at this time. Neurology inpatient team will signoff. ______________________________________________________________________   Thank you for the opportunity to take part in the care of this patient. If you have any further questions, please contact the neurology consultation attending.  Signed,  [facility] Pager Number Erick Blinks  Seizure precautions: Per [facility] statutes, patients with seizures are not allowed to drive until they have been seizure-free for six months and cleared by a physician    Use caution when using heavy equipment or power tools. Avoid working on ladders or at heights. Take showers instead of baths. Ensure the water temperature is not too high on the home water heater. Do not go swimming alone. Do not lock yourself in a room alone (i.e. bathroom). When caring for infants or small children, sit down when holding, feeding, or changing them to minimize risk of injury to the child in the event you have a seizure. Maintain good sleep hygiene. Avoid alcohol.    If patient has another seizure, call 911 and bring them back to the ED if: A.  The seizure lasts longer than 5 minutes.      B.  The patient doesn't wake shortly after the seizure or has new problems such as difficulty seeing, speaking or moving following the seizure C.  The patient was injured during the seizure D.  The patient has a temperature over 102 F (39C) E.  The patient vomited during the seizure and now is having trouble breathing    During the Seizure   - First, ensure adequate ventilation and place patients on the floor on their  left side  Loosen clothing around the neck and ensure the airway is patent. If the patient is clenching the teeth, do not force the mouth open with any object as this can cause severe damage - Remove all items from the surrounding that can be hazardous. The patient may be oblivious to what's happening and may not even know what he or she is doing. If the patient is confused and wandering, either gently guide him/her away and block access to outside areas - Reassure the individual and be comforting - Call 911. In most cases, the seizure ends before EMS arrives. However, there are cases when seizures may last over 3 to 5 minutes. Or the individual may have developed breathing difficulties or severe injuries. If a pregnant patient or a person with diabetes develops a seizure, it is prudent to call an ambulance. - Finally, if the patient does not regain full consciousness, then call EMS. Most patients will remain confused  for about 45 to 90 minutes after a seizure, so you must use judgment in calling for help. - Avoid restraints but make sure the patient is in a bed with padded side rails - Place the individual in a lateral position with the neck slightly flexed; this will help the saliva drain from the mouth and prevent the tongue from falling backward - Remove all nearby furniture and other hazards from the area - Provide verbal assurance as the individual is regaining consciousness - Provide the patient with privacy if possible - Call for help and start treatment as ordered by the caregiver    After the Seizure (Postictal Stage)   After a seizure, most patients experience confusion, fatigue, muscle pain and/or a headache. Thus, one should permit the individual to sleep. For the next few days, reassurance is essential. Being calm and helping reorient the person is also of importance.   Most seizures are painless and end spontaneously. Seizures are not harmful to others but can lead to complications  such as stress on the lungs, brain and the heart. Individuals with prior lung problems may develop labored breathing and respiratory distress.

## 2021-01-01 NOTE — Progress Notes (Signed)
Foley removed and enema administered with a very large stool amount of output noted. Pt tol well, no bleeding, pain  or change in condition. Will cont to monitor.

## 2021-01-01 NOTE — Progress Notes (Signed)
Initial Nutrition Assessment  DOCUMENTATION CODES:   Underweight (likely severe PCM)  INTERVENTION:   Patient showing signs of refeeding, potassium and phosphorus being repleted. Recommend additional thiamine supplementation given poor PO intake and likely malnutrition.   Liberalize diet to REGULAR   Ensure Enlive po BID, each supplement provides 350 kcal and 20 grams of protein  MVI daily   NUTRITION DIAGNOSIS:   Inadequate oral intake related to poor appetite as evidenced by meal completion < 25%.  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 Presents this admission with acute encephalopathy from unknown etiology.   Unable to reach patient or family member. Last two meal completions charted as 25%. Patient showing signs of refeeding, K and PO4 being replaced. Recommend addition of thiamine. RD to provide supplementation to maximize kcal and protein. If intake unable to increase recommend initiation of nutrition support given likely severe malnutrition status.   Records indicate patient weighed 45.4 kg on 02/05/20 and show a stated weigh of 43.1 kg this admission. Will need to obtain actual weight to assess for weight loss. Suspect severe PCM but unable to diagnose with NFPE.   Drips: potassium phosphate Medications: miralax Labs: Na 134 (L) K 3.3 (L) Phosphorus 2.4 (L)   Diet Order:   Diet Order            Diet Heart Room service appropriate? Yes; Fluid consistency: Thin  Diet effective now                 EDUCATION NEEDS:   Not appropriate for education at this time  Skin:  Skin Assessment: Reviewed RN Assessment  Last BM:  4/1  Height:   Ht Readings from Last 1 Encounters:  12/28/20 5\' 1"   (1.549 m)    Weight:   Wt Readings from Last 1 Encounters:  12/28/20 43.1 kg    BMI:  Body mass index is 17.95 kg/m.  Estimated Nutritional Needs:   Kcal:  1300-1500 kcal  Protein:  70-85 grams  Fluid:  >/= 1.4 L/day  12/30/20 RD, LDN Clinical Nutrition Pager listed in AMION

## 2021-01-02 DIAGNOSIS — R7989 Other specified abnormal findings of blood chemistry: Secondary | ICD-10-CM | POA: Diagnosis not present

## 2021-01-02 DIAGNOSIS — R339 Retention of urine, unspecified: Secondary | ICD-10-CM

## 2021-01-02 DIAGNOSIS — N179 Acute kidney failure, unspecified: Secondary | ICD-10-CM | POA: Diagnosis not present

## 2021-01-02 DIAGNOSIS — G9340 Encephalopathy, unspecified: Secondary | ICD-10-CM | POA: Diagnosis not present

## 2021-01-02 DIAGNOSIS — T796XXA Traumatic ischemia of muscle, initial encounter: Secondary | ICD-10-CM

## 2021-01-02 LAB — CULTURE, BLOOD (ROUTINE X 2)
Culture: NO GROWTH
Culture: NO GROWTH
Special Requests: ADEQUATE
Special Requests: ADEQUATE

## 2021-01-02 LAB — RENAL FUNCTION PANEL
Albumin: 2.3 g/dL — ABNORMAL LOW (ref 3.5–5.0)
Anion gap: 6 (ref 5–15)
BUN: 12 mg/dL (ref 8–23)
CO2: 28 mmol/L (ref 22–32)
Calcium: 8.8 mg/dL — ABNORMAL LOW (ref 8.9–10.3)
Chloride: 105 mmol/L (ref 98–111)
Creatinine, Ser: 0.65 mg/dL (ref 0.44–1.00)
GFR, Estimated: >60 mL/min (ref >60)
Glucose, Bld: 131 mg/dL — ABNORMAL HIGH (ref 70–99)
Phosphorus: 3 mg/dL (ref 2.5–4.6)
Potassium: 3.4 mmol/L — ABNORMAL LOW (ref 3.5–5.1)
Sodium: 139 mmol/L (ref 135–145)

## 2021-01-02 MED ORDER — ROPINIROLE HCL 0.25 MG PO TABS
0.2500 mg | ORAL_TABLET | Freq: Every day | ORAL | Status: DC
  Administered 2021-01-02 – 2021-01-03 (×2): 0.25 mg via ORAL
  Filled 2021-01-02 (×3): qty 1

## 2021-01-02 NOTE — Plan of Care (Signed)
  Problem: Education: Goal: Knowledge of General Education information will improve Description: Including pain rating scale, medication(s)/side effects and non-pharmacologic comfort measures Outcome: Progressing   Problem: Clinical Measurements: Goal: Ability to maintain clinical measurements within normal limits will improve Outcome: Progressing Goal: Will remain free from infection Outcome: Progressing Goal: Diagnostic test results will improve Outcome: Progressing Goal: Respiratory complications will improve Outcome: Progressing Goal: Cardiovascular complication will be avoided Outcome: Progressing   Problem: Nutrition: Goal: Adequate nutrition will be maintained Outcome: Progressing   Problem: Elimination: Goal: Will not experience complications related to bowel motility Outcome: Progressing Goal: Will not experience complications related to urinary retention Outcome: Progressing   Problem: Pain Managment: Goal: General experience of comfort will improve Outcome: Progressing   Problem: Safety: Goal: Ability to remain free from injury will improve Outcome: Progressing   Problem: Skin Integrity: Goal: Risk for impaired skin integrity will decrease Outcome: Progressing   Problem: Health Behavior/Discharge Planning: Goal: Ability to manage health-related needs will improve Outcome: Not Progressing   Problem: Activity: Goal: Risk for activity intolerance will decrease Outcome: Not Progressing   Problem: Coping: Goal: Level of anxiety will decrease Outcome: Not Progressing

## 2021-01-02 NOTE — Progress Notes (Signed)
PROGRESS NOTE    Hannah Fisher  CXK:[id number] DOB: [birth year] DOA: 12/28/2020 PCP: Daisy Floro, MD    Brief Narrative:  70 year old female with a history of hypertension, admitted to the hospital after she was found laying on the floor for unknown period of time.  She was noted to be somnolent and confused.  Work-up including MRI imaging, metabolic work-up has been unrevealing.  She was noted to have rhabdomyolysis and acute kidney injury.  She is receiving IV fluids.  Holding sedating medications.  EEG abnormal with possible epileptiform discharges in left temporal area. Neurology consulted. Continue to follow mental status.   Assessment & Plan:   Principal Problem:   Acute encephalopathy Active Problems:   Rhabdomyolysis   Acute encephalopathy -etiology is unclear, may possibly be related to renal failure/dehydration -Daughter feels it is unlikely that patient took excess medications -MRI brain does not show any acute intracranial abnormality -Urinalysis did not show any signs of infection, urine culture negative -TSH, ammonia, B12 are unremarkable -EEG shows possible epileptiform discharges in the left and possibly right temporal areas -appreciate neurology assistance -started on keppra -LTM EEG monitoring did not show seizure activity -Overall mental status appears to be improving -with fall and head injury, may have an element of post concussive syndrome -Continue to monitor  Acute kidney injury -Suspect related to dehydration in the setting of ACE inhibitor/thiazide use as well as rhabdomyolysis -Resolved with hydration  Urinary retention -had foley catheter placed  -abd xray with constipation which may be contributing -started on bethanecol and tamsulosin -patient failed voiding trial on 4/2 -I suspect she will need to discharge with foley and follow up with urology -will discuss with urology  Leukocytosis -Possibly related to hemoconcentration -No  obvious sources of infection at this time -Resolved with IV fluids  Hypertension -Holding ACE inhibitor as well as thiazide due to renal failure, blood pressure currently stable  Acute traumatic rhabdomyolysis -Secondary to fall -recent CK level of 6268, repeat level down to  870 -Monitor urine output  Fall and deconditioning -Daughter reports that patient has been falling more frequently -She does have some pain in her hips bilaterally, xray of pelvis/L hip unremarkable -Physical therapy evaluation with recommendations for CIR  Elevated LFTs -Imaging of gallbladder unrevealing -Suspect this is related to rhabdomyolysis -Hepatitis panel negative -No abdominal tenderness on exam  Hypokalemia -Replaced -Magnesium normal  Hypophosphatemia -replace  History of chronic weakness of upper and lower extremities -B12 is normal -She is followed by an outpatient neurologist  Constipation -noted on abd xray -patient was disimpacted by nursing staff and had several large bowel movements   DVT prophylaxis: heparin injection 5,000 Units Start: 12/29/20 1630 SCDs Start: 12/28/20 2226   Code Status: full code Family Communication: discussed with daughter over the phone Disposition Plan: Status is: Inpatient  The patient will require care spanning > 2 midnights and should be moved to inpatient because: Altered mental status  Dispo: The patient is from: Home              Anticipated d/c is to: TBD              Patient currently is not medically stable to d/c.   Difficult to place patient No  Consultants:   Neurology  Procedures:  EEG: This is an abnormal waiting and drowsy study due to 1) left temporal (and possibly very occasionally right temporal) slowing and definitively epileptiform discharges (with an aftergoing slow wave distinguishing from wickets).  This finding  would be suggestive of an epileptiform foci in the left temporal area (possibly relatively deep with  occasional representation in the contralateral hemisphere on surface EEG).    This is superimposed upon diminished background organization as described above, most clinically compatible with a mild diffuse encephalopathy.    There were no seizures however.    Antimicrobials:       Subjective: Patient had several bowel movements yesterday after she was disimpacted by nursing staff  Objective: Vitals:   12/31/20 2000 01/01/21 0000 01/01/21 0538 01/01/21 2044  BP: (!) 158/94 (!) 144/87 (!) 156/87 (!) 148/99  Pulse: 83 88 98 100  Resp: 14 14 17 17   Temp: 99 F (37.2 C)  98.9 F (37.2 C) 98.9 F (37.2 C)  TempSrc: Axillary  Axillary Axillary  SpO2: 97% 96% 96% 97%  Weight:      Height:        Intake/Output Summary (Last 24 hours) at 01/02/2021 1019 Last data filed at 01/02/2021 0537 Gross per 24 hour  Intake 360 ml  Output 2300 ml  Net -1940 ml   Filed Weights   12/28/20 1612  Weight: 43.1 kg    Examination:  General exam: Alert, awake, oriented x 3 Respiratory system: Clear to auscultation. Respiratory effort normal. Cardiovascular system:RRR. No murmurs, rubs, gallops. Gastrointestinal system: Abdomen is nondistended, soft and nontender. No organomegaly or masses felt. Normal bowel sounds heard. Central nervous system: Alert and oriented. No focal neurological deficits. Extremities: No C/C/E, +pedal pulses Skin: No rashes, lesions or ulcers Psychiatry: Judgement and insight appear normal. Mood & affect appropriate.    Data Reviewed: I have personally reviewed following labs and imaging studies  CBC: Recent Labs  Lab 12/28/20 1631 12/29/20 0311 12/30/20 0043 12/31/20 0019  WBC 21.8* 19.5* 13.2* 8.5  NEUTROABS 19.6*  --  11.7*  --   HGB 14.1 12.5 11.4* 10.2*  HCT 43.0 37.4 34.1* 30.9*  MCV 93.5 92.1 91.9 94.2  PLT 350 281 227 188   Basic Metabolic Panel: Recent Labs  Lab 12/28/20 1631 12/29/20 0311 12/30/20 0043 12/30/20 2005 12/31/20 0019  01/01/21 0230  NA 141 142 146*  --  139 134*  K 3.2* 3.3* 3.6  --  3.1* 3.3*  CL 107 110 108  --  102 102  CO2 15* 19* 32  --  32 25  GLUCOSE 155* 127* 172*  --  112* 111*  BUN 58* 52* 35*  --  20 15  CREATININE 1.27* 1.11* 0.79  --  0.59 0.54  CALCIUM 9.5 9.4 9.1  --  8.4* 8.4*  MG  --   --  2.6*  --   --   --   PHOS  --   --  1.1* 2.8 2.3* 2.4*   GFR: Estimated Creatinine Clearance: 44.5 mL/min (by C-G formula based on SCr of 0.54 mg/dL). Liver Function Tests: Recent Labs  Lab 12/28/20 1631 12/29/20 0311 12/30/20 0043 12/31/20 0019 01/01/21 0230  AST 142* 171*  --   --   --   ALT 66* 74*  --   --   --   ALKPHOS 69 59  --   --   --   BILITOT 1.6* 1.5*  --   --   --   PROT 6.7 5.5*  --   --   --   ALBUMIN 4.1 3.3* 2.8* 2.5* 2.2*   No results for input(s): LIPASE, AMYLASE in the last 168 hours. Recent Labs  Lab 12/29/20 0311  AMMONIA 32  Coagulation Profile: No results for input(s): INR, PROTIME in the last 168 hours. Cardiac Enzymes: Recent Labs  Lab 12/28/20 1631 12/29/20 0311 12/30/20 0043 12/31/20 0019 01/01/21 0230  CKTOTAL 5,980* 6,268* 3,843* 2,150* 870*   BNP (last 3 results) No results for input(s): PROBNP in the last 8760 hours. HbA1C: Recent Labs    01/01/21 0230  HGBA1C 5.4   CBG: Recent Labs  Lab 12/28/20 1741  GLUCAP 140*   Lipid Profile: No results for input(s): CHOL, HDL, LDLCALC, TRIG, CHOLHDL, LDLDIRECT in the last 72 hours. Thyroid Function Tests: No results for input(s): TSH, T4TOTAL, FREET4, T3FREE, THYROIDAB in the last 72 hours. Anemia Panel: No results for input(s): VITAMINB12, FOLATE, FERRITIN, TIBC, IRON, RETICCTPCT in the last 72 hours. Sepsis Labs: Recent Labs  Lab 12/28/20 2140 12/28/20 2352  LATICACIDVEN 2.6* 1.2    Recent Results (from the past 240 hour(s))  Culture, blood (routine x 2)     Status: None (Preliminary result)   Collection Time: 12/28/20  9:40 PM   Specimen: BLOOD  Result Value Ref Range  Status   Specimen Description BLOOD SITE NOT SPECIFIED  Final   Special Requests   Final    BOTTLES DRAWN AEROBIC AND ANAEROBIC Blood Culture adequate volume   Culture   Final    NO GROWTH 4 DAYS Performed at [facility], [address], [place], [place] [address]    Report Status PENDING  Incomplete  Culture, blood (routine x 2)     Status: None (Preliminary result)   Collection Time: 12/28/20  9:40 PM   Specimen: BLOOD  Result Value Ref Range Status   Specimen Description BLOOD SITE NOT SPECIFIED  Final   Special Requests   Final    BOTTLES DRAWN AEROBIC AND ANAEROBIC Blood Culture adequate volume   Culture   Final    NO GROWTH 4 DAYS Performed at [facility], [address], [place], [place] [address]    Report Status PENDING  Incomplete  Resp Panel by RT-PCR (Flu A&B, Covid) Nasopharyngeal Swab     Status: None   Collection Time: 12/28/20 10:10 PM   Specimen: Nasopharyngeal Swab; Nasopharyngeal(NP) swabs in vial transport medium  Result Value Ref Range Status   SARS Coronavirus 2 by RT PCR NEGATIVE NEGATIVE Final    Comment: (NOTE) SARS-CoV-2 target nucleic acids are NOT DETECTED.  The SARS-CoV-2 RNA is generally detectable in upper respiratory specimens during the acute phase of infection. The lowest concentration of SARS-CoV-2 viral copies this assay can detect is 138 copies/mL. A negative result does not preclude SARS-Cov-2 infection and should not be used as the sole basis for treatment or other patient management decisions. A negative result may occur with  improper specimen collection/handling, submission of specimen other than nasopharyngeal swab, presence of viral mutation(s) within the areas targeted by this assay, and inadequate number of viral copies(<138 copies/mL). A negative result must be combined with clinical observations, patient history, and epidemiological information. The expected result is Negative.  Fact Sheet for Patients:   BloggerCourse.com  Fact Sheet for Healthcare Providers:  SeriousBroker.it  This test is no t yet approved or cleared by the [place] FDA and  has been authorized for detection and/or diagnosis of SARS-CoV-2 by FDA under an Emergency Use Authorization (EUA). This EUA will remain  in effect (meaning this test can be used) for the duration of the COVID-19 declaration under Section 564(b)(1) of the Act, 21 U.S.C.section 360bbb-3(b)(1), unless the authorization is terminated  or revoked sooner.       Influenza A by PCR NEGATIVE NEGATIVE Final   Influenza B by PCR NEGATIVE NEGATIVE Final    Comment: (NOTE) The Xpert Xpress SARS-CoV-2/FLU/RSV plus assay is intended as an aid in the diagnosis of influenza from Nasopharyngeal swab specimens and should not be used as a sole basis for treatment. Nasal washings and aspirates are unacceptable for Xpert Xpress SARS-CoV-2/FLU/RSV testing.  Fact Sheet for Patients: BloggerCourse.com  Fact Sheet for Healthcare Providers: SeriousBroker.it  This test is not yet approved or cleared by the [place] FDA and has been authorized for detection and/or diagnosis of SARS-CoV-2 by FDA under an Emergency Use Authorization (EUA). This EUA will remain in effect (meaning this test can be used) for the duration of the COVID-19 declaration under Section 564(b)(1) of the Act, 21 U.S.C. section 360bbb-3(b)(1), unless the authorization is terminated or revoked.  Performed at [facility], [address] [place], [place] [address]   Culture, Urine     Status: None   Collection Time: 12/30/20  5:45 AM   Specimen: Urine, Catheterized  Result Value Ref Range Status   Specimen Description URINE, CATHETERIZED  Final   Special Requests NONE  Final   Culture   Final    NO GROWTH Performed at [facility], [address], [place], [place]  [address]    Report Status 12/31/2020 FINAL  Final         Radiology Studies: DG HIP UNILAT WITH PELVIS 2-3 VIEWS LEFT  Result Date: 01/01/2021 CLINICAL DATA:  Left hip pain after a fall. EXAM: DG HIP (WITH OR WITHOUT PELVIS) 2-3V LEFT COMPARISON:  None. FINDINGS: Degenerative changes with the left hip with acetabular joint space narrowing, sclerosis, and small osteophyte formation. No evidence of acute fracture or dislocation. No focal bone lesions. SI joints and symphysis pubis are not displaced. Infiltration in the subcutaneous fat lateral to the left hip suggesting soft tissue contusion. IMPRESSION: Degenerative changes in the left hip. No acute fracture or dislocation. Electronically Signed   By: Burman Nieves M.D.   On: 01/01/2021 21:49        Scheduled Meds: . bethanechol  5 mg Oral TID  . Chlorhexidine Gluconate Cloth  6 each Topical Daily  . feeding supplement  237 mL Oral BID BM  . heparin injection (subcutaneous)  5,000 Units Subcutaneous Q8H  . levETIRAcetam  500 mg Oral BID  . multivitamin with minerals  1 tablet Oral Daily  . polyethylene glycol  17 g Oral Daily  . tamsulosin  0.4 mg Oral Daily  . terbinafine   Topical BID   Continuous Infusions:    LOS: 4 days    Time spent:    Erick Blinks, MD [facility]   If 7PM-7AM, please contact night-coverage [email or web address]  01/02/2021, 10:19 AM

## 2021-01-03 DIAGNOSIS — N179 Acute kidney failure, unspecified: Secondary | ICD-10-CM | POA: Diagnosis not present

## 2021-01-03 DIAGNOSIS — R7989 Other specified abnormal findings of blood chemistry: Secondary | ICD-10-CM | POA: Diagnosis not present

## 2021-01-03 DIAGNOSIS — E872 Acidosis: Secondary | ICD-10-CM | POA: Diagnosis not present

## 2021-01-03 DIAGNOSIS — G9340 Encephalopathy, unspecified: Secondary | ICD-10-CM | POA: Diagnosis not present

## 2021-01-03 LAB — RENAL FUNCTION PANEL
Albumin: 2.2 g/dL — ABNORMAL LOW (ref 3.5–5.0)
Anion gap: 6 (ref 5–15)
BUN: 16 mg/dL (ref 8–23)
CO2: 26 mmol/L (ref 22–32)
Calcium: 8.7 mg/dL — ABNORMAL LOW (ref 8.9–10.3)
Chloride: 103 mmol/L (ref 98–111)
Creatinine, Ser: 0.67 mg/dL (ref 0.44–1.00)
GFR, Estimated: >60 mL/min (ref >60)
Glucose, Bld: 106 mg/dL — ABNORMAL HIGH (ref 70–99)
Phosphorus: 3.3 mg/dL (ref 2.5–4.6)
Potassium: 3.7 mmol/L (ref 3.5–5.1)
Sodium: 135 mmol/L (ref 135–145)

## 2021-01-03 LAB — SARS CORONAVIRUS 2 (TAT 6-24 HRS): SARS Coronavirus 2: NEGATIVE

## 2021-01-03 MED ORDER — CYCLOBENZAPRINE HCL 5 MG PO TABS
5.0000 mg | ORAL_TABLET | Freq: Three times a day (TID) | ORAL | Status: DC | PRN
  Administered 2021-01-03: 5 mg via ORAL
  Filled 2021-01-03: qty 1

## 2021-01-03 NOTE — Progress Notes (Addendum)
Physical Therapy Treatment Patient Details Name: Hannah Fisher MRN: [id number] DOB: [birth year] Today's Date: 01/03/2021    History of Present Illness 70 y.o. female with progressive weakness had MRI done in November 2020 one of the brain which was unremarkable, Daughter found her on floor and confused 3/29 and brought her to ED. Last known well 3/27. In ED found to be confused and tremulous, labs remarkable for elevated creatine and CK levels MRI unremarkable. EEG suggestive of epileptiform foci in the left temporal area Admitted for acute encephalopathy, and rhabdomyolysis PMH: hypertension, OA, osteoporosis    PT Comments    Pt is eager to see therapist but less eager to get out of bed to chair. Pt continues to have increased difficulty with cognition in particular slow processing and sequencing, requiring single step commands for completion of all tasks. Pt is able to come to EoB with maxAx2, and stand with maxAX2. Given need for increased velocity to pivot to chair pt requires totalAx2 to accomplish. Once up in chair pt perseverates on how long she will need to sit up in chair. Tried to distract with appealing television show, and did not give an exact time as OT to see later. Pt has not been given a diagnosis with medical necessity warranted for CIR, at this time PT changing recommendation to SNF. PT will continue to follow acutely.     Follow Up Recommendations  SNF     Equipment Recommendations  3in1 (PT);Wheelchair (measurements PT);Wheelchair cushion (measurements PT)       Precautions / Restrictions Precautions Precautions: Fall Restrictions Weight Bearing Restrictions: No    Mobility  Bed Mobility Overal bed mobility: Needs Assistance Bed Mobility: Sit to Supine     Supine to sit: Max assist;+2 for physical assistance;HOB elevated   General bed mobility comments: requires step by step instruction and increased encouragement to perform    Transfers Overall transfer  level: Needs assistance Equipment used: 2 person hand held assist Transfers: Stand Pivot Transfers;Sit to/from Stand Sit to Stand: Max assist;+2 physical assistance Stand pivot transfers: Total assist;+2 physical assistance Squat pivot transfers: Total assist     General transfer comment: maxAx2 with maximal multimodal cuing and very slow movement as well as blocking of bilateral knees to come to standing but able to achieve upright twice, on second attempt able to pivot to chair with total Ax2 due to speed needed to insure safety  Ambulation/Gait             General Gait Details: unable         Balance Overall balance assessment: Needs assistance Sitting-balance support: Feet supported;Bilateral upper extremity supported Sitting balance-Leahy Scale: Poor Sitting balance - Comments: able to progress to seated with hands in lap for short bout, constant cuing to make adjustments to combat leaning Postural control: Posterior lean;Left lateral lean Standing balance support: During functional activity Standing balance-Leahy Scale: Zero                              Cognition Arousal/Alertness: Awake/alert Behavior During Therapy: Flat affect Overall Cognitive Status: Impaired/Different from baseline Area of Impairment: Attention;Awareness;Problem solving;Safety/judgement;Following commands;Memory;Orientation                 Orientation Level: Disoriented to;Situation Current Attention Level: Selective Memory: Decreased short-term memory Following Commands: Follows one step commands inconsistently;Follows one step commands with increased time;Follows multi-step commands inconsistently Safety/Judgement: Decreased awareness of deficits;Decreased awareness of safety Awareness: Emergent Problem  Solving: Slow processing;Decreased initiation;Difficulty sequencing;Requires verbal cues;Requires tactile cues General Comments: continues to have poor problem solving and  sequencing. requires single step commands for almost all voluntary movement, however noted to exhibit spontaneous movement easily on a few occassions         General Comments General comments (skin integrity, edema, etc.): VSS on RA      Pertinent Vitals/Pain Pain Assessment: Faces Faces Pain Scale: Hurts a little bit Pain Location: generalized Pain Descriptors / Indicators: Grimacing;Guarding Pain Intervention(s): Monitored during session;Repositioned           PT Goals (current goals can now be found in the care plan section) Acute Rehab PT Goals Patient Stated Goal: get back in bed PT Goal Formulation: With patient/family Time For Goal Achievement: 01/13/21 Potential to Achieve Goals: Fair Progress towards PT goals: Progressing toward goals    Frequency    Min 2X/week      PT Plan Discharge plan needs to be updated;Frequency needs to be updated       AM-PAC PT "6 Clicks" Mobility   Outcome Measure  Help needed turning from your back to your side while in a flat bed without using bedrails?: A Lot Help needed moving from lying on your back to sitting on the side of a flat bed without using bedrails?: A Lot Help needed moving to and from a bed to a chair (including a wheelchair)?: Total Help needed standing up from a chair using your arms (e.g., wheelchair or bedside chair)?: A Lot Help needed to walk in hospital room?: Total Help needed climbing 3-5 steps with a railing? : Total 6 Click Score: 9    End of Session Equipment Utilized During Treatment: Gait belt Activity Tolerance: Patient tolerated treatment well Patient left: in chair;with call bell/phone within reach;with chair alarm set Nurse Communication: Mobility status PT Visit Diagnosis: Unsteadiness on feet (R26.81);Other abnormalities of gait and mobility (R26.89);Muscle weakness (generalized) (M62.81);History of falling (Z91.81);Difficulty in walking, not elsewhere classified (R26.2);Pain Pain -  Right/Left:  (bilateral) Pain - part of body: Leg     Time: 7824-2353 PT Time Calculation (min) (ACUTE ONLY): 27 min  Charges:  $Therapeutic Activity: 23-37 mins                     Elycia Woodside B. Beverely Risen PT, DPT Acute Rehabilitation Services Pager [phone] Office [phone]    Elon Alas Fleet 01/03/2021, 1:45 PM

## 2021-01-03 NOTE — Progress Notes (Signed)
Occupational Therapy Treatment Patient Details Name: Hannah Fisher MRN: [id number] DOB: 08-Apr-1951 Today's Date: 01/03/2021    History of present illness 70 y.o. female with progressive weakness had MRI done in November 2020 one of the brain which was unremarkable, Daughter found her on floor and confused 3/29 and brought her to ED. Last known well 3/27. In ED found to be confused and tremulous, labs remarkable for elevated creatine and CK levels MRI unremarkable. EEG suggestive of epileptiform foci in the left temporal area Admitted for acute encephalopathy, and rhabdomyolysis PMH: hypertension, OA, osteoporosis   OT comments  Pt with gradual progress towards goals, noted with poor sequencing and problem solving of BADLs, requiring Mod A to successfully brush teeth and hair while seated at sink. At end of session, pt noted to be sliding down in chair and attempted to guide pt in scooting back in chair with minimal initiation noted. Pt requesting to get back to bed as she "had been up for a while" with PT session prior. Educated and encouraged purpose of OOB activity though pt ultimately opted to return to bed. Total A for squat pivot and return to supine. Pt reports feeling LOB sitting EOB, but minimal effort to correct and requested OT assistance. Pt benefits from encouragement to attempt tasks throughout to regain independence. Noted no clear diagnosis for CIR consult, so updated DC recs to SNF for short term rehab.    Follow Up Recommendations  SNF;Supervision/Assistance - 24 hour    Equipment Recommendations  3 in 1 bedside commode;Wheelchair (measurements OT);Wheelchair cushion (measurements OT)    Recommendations for Other Services      Precautions / Restrictions Precautions Precautions: Fall Restrictions Weight Bearing Restrictions: No       Mobility Bed Mobility Overal bed mobility: Needs Assistance Bed Mobility: Sit to Supine       Sit to supine: Total assist   General  bed mobility comments: poor initiation    Transfers Overall transfer level: Needs assistance Equipment used: 1 person hand held assist Transfers: Squat Pivot Transfers     Squat pivot transfers: Total assist     General transfer comment: Total A for pivot from chair to bed - no muscle activation noted to assist in task    Balance Overall balance assessment: Needs assistance Sitting-balance support: Feet supported;Bilateral upper extremity supported Sitting balance-Leahy Scale: Poor Sitting balance - Comments: reliant on UE support sitting EOB Postural control: Posterior lean;Left lateral lean Standing balance support: During functional activity Standing balance-Leahy Scale: Zero                             ADL either performed or assessed with clinical judgement   ADL Overall ADL's : Needs assistance/impaired     Grooming: Moderate assistance;Sitting;Oral care;Brushing hair Grooming Details (indicate cue type and reason): Heavy cueing needed for sequencing brushing teeth and problem solving. Pt initially wanted to keep feet reclined in recliner at sink for task, extended time educating how that would be more difficult than keeping feet down. Pt brushed sides of hair but reported unable to brush back of hair (cued to lean forward with continued difficulty noted). Pt requesting OT assist for this task. Hands on assist needed to assist pt in leaning forward to spit in sink, lean forward to brush hair, etc.                  Toilet Transfer: Total assistance;Squat-pivot Toilet Transfer Details (indicate cue  type and reason): simulated from chair to bed           General ADL Comments: Great difficulty in problem solving/sequencing BADLs though inconsistencies noted. Heavy cueing needed to attempt tasks with poor initiation noted. Pt received in chair after PT session, requesting adamantly to go back to bed at end of OT session - unable to redirect and pt noted to  be sliding in chair (due to not wanting to be up?).     Vision   Vision Assessment?: Vision impaired- to be further tested in functional context   Perception     Praxis      Cognition Arousal/Alertness: Awake/alert Behavior During Therapy: Flat affect Overall Cognitive Status: Impaired/Different from baseline Area of Impairment: Attention;Awareness;Problem solving;Safety/judgement;Following commands;Memory;Orientation                 Orientation Level: Disoriented to;Situation Current Attention Level: Selective Memory: Decreased short-term memory Following Commands: Follows one step commands inconsistently;Follows one step commands with increased time;Follows multi-step commands inconsistently Safety/Judgement: Decreased awareness of deficits;Decreased awareness of safety Awareness: Emergent Problem Solving: Slow processing;Decreased initiation;Difficulty sequencing;Requires verbal cues;Requires tactile cues General Comments: Poor problem solving and slow processing continued to be noted, inconsistently. Pt with great difficulty sequencing and following directions for brushing teeth at sink, but became very involved in problem solving when it came to being able to get back into the bed as pt did not want to stay up in chair. (ex. Moving chair to [facility] so tray table could fit over pt).        Exercises     Shoulder Instructions       General Comments VSS on RA.    Pertinent Vitals/ Pain       Pain Assessment: Faces Faces Pain Scale: Hurts a little bit Pain Location: bottom, generalized Pain Descriptors / Indicators: Grimacing;Guarding Pain Intervention(s): Monitored during session;Repositioned  Home Living                                          Prior Functioning/Environment              Frequency  Min 2X/week        Progress Toward Goals  OT Goals(current goals can now be found in the care plan section)  Progress towards OT goals:  Progressing toward goals  Acute Rehab OT Goals Patient Stated Goal: get back in bed OT Goal Formulation: With patient Time For Goal Achievement: 01/14/21 Potential to Achieve Goals: Good ADL Goals Pt Will Perform Grooming: with min assist;standing Pt Will Perform Upper Body Dressing: with min assist;sitting Pt Will Perform Lower Body Dressing: with mod assist;sitting/lateral leans;sit to/from stand Pt Will Transfer to Toilet: with min assist;stand pivot transfer;bedside commode Pt Will Perform Toileting - Clothing Manipulation and hygiene: sitting/lateral leans;sit to/from stand;with mod assist Additional ADL Goal #1: Pt to demonstrate ability to follow one step commands during ADLs > 75% of the time Additional ADL Goal #2: Pt to demonstrate ability to locate and name 5/5 ADL items without verbal cues during tasks for further visual/cognitive assessment  Plan Discharge plan needs to be updated    Co-evaluation                 AM-PAC OT "6 Clicks" Daily Activity     Outcome Measure   Help from another person eating meals?: A Little Help from another person taking care  of personal grooming?: A Lot Help from another person toileting, which includes using toliet, bedpan, or urinal?: Total Help from another person bathing (including washing, rinsing, drying)?: Total Help from another person to put on and taking off regular upper body clothing?: A Lot Help from another person to put on and taking off regular lower body clothing?: Total 6 Click Score: 10    End of Session Equipment Utilized During Treatment: Gait belt  OT Visit Diagnosis: Unsteadiness on feet (R26.81);Other abnormalities of gait and mobility (R26.89);Muscle weakness (generalized) (M62.81);History of falling (Z91.81);Other symptoms and signs involving cognitive function;Apraxia (R48.2);Ataxia, unspecified (R27.0)   Activity Tolerance Patient limited by fatigue   Patient Left in bed;with call bell/phone within  reach;with bed alarm set   Nurse Communication          Time: 5366-4403 OT Time Calculation (min): 50 min  Charges: OT General Charges $OT Visit: 1 Visit OT Treatments $Self Care/Home Management : 23-37 mins $Therapeutic Activity: 8-22 mins  Bradd Canary, OTR/L Acute Rehab Services Office: [phone]   Lorre Munroe 01/03/2021, 12:44 PM

## 2021-01-03 NOTE — TOC Progression Note (Addendum)
Transition of Care [facility]) - Progression Note    Patient Details  Name: Hannah Fisher MRN: [id number] Date of Birth: 05-28-1951  Transition of Care [facility]) CM/SW Contact  Mearl Latin, LCSW Phone Number: 01/03/2021, 11:45 AM  Clinical Narrative:    11:45am-CSW left voicemail for patient's daughter to provide SNF offers as CIR does not have an appropriate diagnosis to admit patient at this time. CSW appreciates PT seeing patient today for insurance approval.    12pm-CSW received call back from patient's daughter and provided SNF bed offers. She stated she will review list and call CSW back.  2:20pm-CSW received call from patient's daughter. She is requesting 1-[facility], 2-[facility]. CSW awaiting response from [facility] on bed availability and will start insurance authorization process.   2:44pm-[facility] requests CSW contact them in the morning to check on beds as they are rearranging rooms. [facility] stated that patient must be covid vaccine boosted. CSW updated patient's daughter and faxed insurance clinicals pending a facility.    3:30pm-CSW received call from [facility]. They can accept patient tomorrow and requested family come at 11am for paperwork. CSW updated patient's daughter and she is able to do so. Will follow up on insurance approval.      Barriers to Discharge: Insurance Authorization,Continued Medical Work up  [facility]   In-house Referral: Clinical Social Work   Post Acute Care Choice: IP Rehab,Skilled Nursing Facility,Home Health Living arrangements for the past 2 months: Single Family Home                                       Social Determinants of Health (SDOH) Interventions    Readmission Risk Interventions No flowsheet data found.

## 2021-01-03 NOTE — Progress Notes (Signed)
PROGRESS NOTE    Hannah Fisher  JSH:[id number] DOB: 10/08/1950 DOA: 12/28/2020 PCP: Daisy Floro, MD    Brief Narrative:  71 year old female with a history of hypertension, admitted to the hospital after she was found laying on the floor for unknown period of time.  She was noted to be somnolent and confused.  Work-up including MRI imaging, metabolic work-up has been unrevealing.  She was noted to have rhabdomyolysis and acute kidney injury.  She is receiving IV fluids.  Holding sedating medications.  EEG abnormal with possible epileptiform discharges in left temporal area. Neurology consulted. Continue to follow mental status.   Assessment & Plan:   Principal Problem:   Acute encephalopathy Active Problems:   Rhabdomyolysis   Acute encephalopathy -etiology is unclear, may possibly be related to renal failure/dehydration -Daughter feels it is unlikely that patient took excess medications -MRI brain does not show any acute intracranial abnormality -Urinalysis did not show any signs of infection, urine culture negative -TSH, ammonia, B12 are unremarkable -EEG shows possible epileptiform discharges in the left and possibly right temporal areas -appreciate neurology assistance -started on keppra -LTM EEG monitoring did not show seizure activity -Overall mental status appears to be improving -with fall and head injury, may have an element of post concussive syndrome -Continue to monitor  Acute kidney injury -Suspect related to dehydration in the setting of ACE inhibitor/thiazide use as well as rhabdomyolysis -Resolved with hydration  Urinary retention -had foley catheter placed  -abd xray with constipation which may be contributing -started on bethanecol and tamsulosin -patient failed voiding trial on 4/2 -discussed with Dr. Arita Miss on call for urology with recommendations to discharge patient with foley catheter in place and follow up in 1 week with  urology  Leukocytosis -Possibly related to hemoconcentration -No obvious sources of infection at this time -Resolved with IV fluids  Hypertension -Holding ACE inhibitor as well as thiazide due to renal failure, blood pressure currently stable  Acute traumatic rhabdomyolysis -Secondary to fall -recent CK level of 6268, repeat level down to  870 -Monitor urine output  Fall and deconditioning -Daughter reports that patient has been falling more frequently -She does have some pain in her hips bilaterally, xray of pelvis/L hip unremarkable -Physical therapy evaluation with recommendations for CIR  Elevated LFTs -Imaging of gallbladder unrevealing -Suspect this is related to rhabdomyolysis -Hepatitis panel negative -No abdominal tenderness on exam  Hypokalemia -Replaced -Magnesium normal  Hypophosphatemia -replaced  History of chronic weakness of upper and lower extremities -B12 is normal -She is followed by an outpatient neurologist  Constipation -noted on abd xray -patient was disimpacted by nursing staff and had several large bowel movements -she is on daily miralax   DVT prophylaxis: heparin injection 5,000 Units Start: 12/29/20 1630 SCDs Start: 12/28/20 2226   Code Status: full code Family Communication: discussed with daughter over the phone Disposition Plan: Status is: Inpatient  The patient will require care spanning > 2 midnights and should be moved to inpatient because: Unsafe d/c plan  Dispo: The patient is from: Home              Anticipated d/c is to: TBD              Patient currently is not medically stable to d/c.   Difficult to place patient No  Consultants:   Neurology  Procedures:  EEG: This is an abnormal waiting and drowsy study due to 1) left temporal (and possibly very occasionally right temporal) slowing and definitively  epileptiform discharges (with an aftergoing slow wave distinguishing from wickets).  This finding would be  suggestive of an epileptiform foci in the left temporal area (possibly relatively deep with occasional representation in the contralateral hemisphere on surface EEG).    This is superimposed upon diminished background organization as described above, most clinically compatible with a mild diffuse encephalopathy.    There were no seizures however.    Antimicrobials:       Subjective: Still feels weak. Says she ate better yesterday. No shortness of breath  Objective: Vitals:   01/02/21 1402 01/02/21 2105 01/03/21 0409 01/03/21 0800  BP: (!) 145/96 139/88 133/87 131/81  Pulse: 100 100 93 92  Resp: 18 15 18 14   Temp: 98 F (36.7 C) 98.3 F (36.8 C) 98 F (36.7 C)   TempSrc: Oral Axillary Oral   SpO2: 95% 98% 97% 97%  Weight:      Height:        Intake/Output Summary (Last 24 hours) at 01/03/2021 1141 Last data filed at 01/03/2021 1004 Gross per 24 hour  Intake 240 ml  Output 1000 ml  Net -760 ml   Filed Weights   12/28/20 1612  Weight: 43.1 kg    Examination:  General exam: Alert, awake, oriented x 3 Respiratory system: Clear to auscultation. Respiratory effort normal. Cardiovascular system:RRR. No murmurs, rubs, gallops. Gastrointestinal system: Abdomen is nondistended, soft and nontender. No organomegaly or masses felt. Normal bowel sounds heard. Central nervous system: Alert and oriented. No focal neurological deficits. Extremities: No C/C/E, +pedal pulses Skin: No rashes, lesions or ulcers Psychiatry: Judgement and insight appear normal. Mood & affect appropriate.    Data Reviewed: I have personally reviewed following labs and imaging studies  CBC: Recent Labs  Lab 12/28/20 1631 12/29/20 0311 12/30/20 0043 12/31/20 0019  WBC 21.8* 19.5* 13.2* 8.5  NEUTROABS 19.6*  --  11.7*  --   HGB 14.1 12.5 11.4* 10.2*  HCT 43.0 37.4 34.1* 30.9*  MCV 93.5 92.1 91.9 94.2  PLT 350 281 227 188   Basic Metabolic Panel: Recent Labs  Lab 12/30/20 0043  12/30/20 2005 12/31/20 0019 01/01/21 0230 01/02/21 1106 01/03/21 0141  NA 146*  --  139 134* 139 135  K 3.6  --  3.1* 3.3* 3.4* 3.7  CL 108  --  102 102 105 103  CO2 32  --  32 25 28 26   GLUCOSE 172*  --  112* 111* 131* 106*  BUN 35*  --  20 15 12 16   CREATININE 0.79  --  0.59 0.54 0.65 0.67  CALCIUM 9.1  --  8.4* 8.4* 8.8* 8.7*  MG 2.6*  --   --   --   --   --   PHOS 1.1* 2.8 2.3* 2.4* 3.0 3.3   GFR: Estimated Creatinine Clearance: 44.5 mL/min (by C-G formula based on SCr of 0.67 mg/dL). Liver Function Tests: Recent Labs  Lab 12/28/20 1631 12/29/20 0311 12/30/20 0043 12/31/20 0019 01/01/21 0230 01/02/21 1106 01/03/21 0141  AST 142* 171*  --   --   --   --   --   ALT 66* 74*  --   --   --   --   --   ALKPHOS 69 59  --   --   --   --   --   BILITOT 1.6* 1.5*  --   --   --   --   --   PROT 6.7 5.5*  --   --   --   --   --  ALBUMIN 4.1 3.3* 2.8* 2.5* 2.2* 2.3* 2.2*   No results for input(s): LIPASE, AMYLASE in the last 168 hours. Recent Labs  Lab 12/29/20 0311  AMMONIA 32   Coagulation Profile: No results for input(s): INR, PROTIME in the last 168 hours. Cardiac Enzymes: Recent Labs  Lab 12/28/20 1631 12/29/20 0311 12/30/20 0043 12/31/20 0019 01/01/21 0230  CKTOTAL 5,980* 6,268* 3,843* 2,150* 870*   BNP (last 3 results) No results for input(s): PROBNP in the last 8760 hours. HbA1C: Recent Labs    01/01/21 0230  HGBA1C 5.4   CBG: Recent Labs  Lab 12/28/20 1741  GLUCAP 140*   Lipid Profile: No results for input(s): CHOL, HDL, LDLCALC, TRIG, CHOLHDL, LDLDIRECT in the last 72 hours. Thyroid Function Tests: No results for input(s): TSH, T4TOTAL, FREET4, T3FREE, THYROIDAB in the last 72 hours. Anemia Panel: No results for input(s): VITAMINB12, FOLATE, FERRITIN, TIBC, IRON, RETICCTPCT in the last 72 hours. Sepsis Labs: Recent Labs  Lab 12/28/20 2140 12/28/20 2352  LATICACIDVEN 2.6* 1.2    Recent Results (from the past 240 hour(s))  Culture,  blood (routine x 2)     Status: None   Collection Time: 12/28/20  9:40 PM   Specimen: BLOOD  Result Value Ref Range Status   Specimen Description BLOOD SITE NOT SPECIFIED  Final   Special Requests   Final    BOTTLES DRAWN AEROBIC AND ANAEROBIC Blood Culture adequate volume   Culture   Final    NO GROWTH 5 DAYS Performed at [facility], [address] [place], [place] [address]    Report Status 01/02/2021 FINAL  Final  Culture, blood (routine x 2)     Status: None   Collection Time: 12/28/20  9:40 PM   Specimen: BLOOD  Result Value Ref Range Status   Specimen Description BLOOD SITE NOT SPECIFIED  Final   Special Requests   Final    BOTTLES DRAWN AEROBIC AND ANAEROBIC Blood Culture adequate volume   Culture   Final    NO GROWTH 5 DAYS Performed at [facility], [address] [place], [place] [address]    Report Status 01/02/2021 FINAL  Final  Resp Panel by RT-PCR (Flu A&B, Covid) Nasopharyngeal Swab     Status: None   Collection Time: 12/28/20 10:10 PM   Specimen: Nasopharyngeal Swab; Nasopharyngeal(NP) swabs in vial transport medium  Result Value Ref Range Status   SARS Coronavirus 2 by RT PCR NEGATIVE NEGATIVE Final    Comment: (NOTE) SARS-CoV-2 target nucleic acids are NOT DETECTED.  The SARS-CoV-2 RNA is generally detectable in upper respiratory specimens during the acute phase of infection. The lowest concentration of SARS-CoV-2 viral copies this assay can detect is 138 copies/mL. A negative result does not preclude SARS-Cov-2 infection and should not be used as the sole basis for treatment or other patient management decisions. A negative result may occur with  improper specimen collection/handling, submission of specimen other than nasopharyngeal swab, presence of viral mutation(s) within the areas targeted by this assay, and inadequate number of viral copies(<138 copies/mL). A negative result must be combined with clinical observations, patient history,  and epidemiological information. The expected result is Negative.  Fact Sheet for Patients:  BloggerCourse.com  Fact Sheet for Healthcare Providers:  SeriousBroker.it  This test is no t yet approved or cleared by the [place] FDA and  has been authorized for detection and/or diagnosis of SARS-CoV-2 by FDA under an Emergency Use Authorization (EUA). This EUA will remain  in effect (meaning this test can be used) for the duration of the COVID-19 declaration under Section 564(b)(1) of the Act, 21 U.S.C.section 360bbb-3(b)(1), unless the authorization is terminated  or revoked sooner.       Influenza A by PCR NEGATIVE NEGATIVE Final   Influenza B by PCR NEGATIVE NEGATIVE Final    Comment: (NOTE) The Xpert Xpress SARS-CoV-2/FLU/RSV plus assay is intended as an aid in the diagnosis of influenza from Nasopharyngeal swab specimens and should not be used as a sole basis for treatment. Nasal washings and aspirates are unacceptable for Xpert Xpress SARS-CoV-2/FLU/RSV testing.  Fact Sheet for Patients: BloggerCourse.com  Fact Sheet for Healthcare Providers: SeriousBroker.it  This test is not yet approved or cleared by the [place] FDA and has been authorized for detection and/or diagnosis of SARS-CoV-2 by FDA under an Emergency Use Authorization (EUA). This EUA will remain in effect (meaning this test can be used) for the duration of the COVID-19 declaration under Section 564(b)(1) of the Act, 21 U.S.C. section 360bbb-3(b)(1), unless the authorization is terminated or revoked.  Performed at [facility], [address] [place], [place] [address]   Culture, Urine     Status: None   Collection Time: 12/30/20  5:45 AM   Specimen: Urine, Catheterized  Result Value Ref Range Status   Specimen Description URINE, CATHETERIZED  Final   Special Requests NONE  Final    Culture   Final    NO GROWTH Performed at [facility], [address] [place], [place] [address]    Report Status 12/31/2020 FINAL  Final         Radiology Studies: DG HIP UNILAT WITH PELVIS 2-3 VIEWS LEFT  Result Date: 01/01/2021 CLINICAL DATA:  Left hip pain after a fall. EXAM: DG HIP (WITH OR WITHOUT PELVIS) 2-3V LEFT COMPARISON:  None. FINDINGS: Degenerative changes with the left hip with acetabular joint space narrowing, sclerosis, and small osteophyte formation. No evidence of acute fracture or dislocation. No focal bone lesions. SI joints and symphysis pubis are not displaced. Infiltration in the subcutaneous fat lateral to the left hip suggesting soft tissue contusion. IMPRESSION: Degenerative changes in the left hip. No acute fracture or dislocation. Electronically Signed   By: Burman Nieves M.D.   On: 01/01/2021 21:49        Scheduled Meds: . Chlorhexidine Gluconate Cloth  6 each Topical Daily  . feeding supplement  237 mL Oral BID BM  . heparin injection (subcutaneous)  5,000 Units Subcutaneous Q8H  . levETIRAcetam  500 mg Oral BID  . multivitamin with minerals  1 tablet Oral Daily  . polyethylene glycol  17 g Oral Daily  . rOPINIRole  0.25 mg Oral QHS  . terbinafine   Topical BID   Continuous Infusions:    LOS: 5 days    Time spent:    Erick Blinks, MD [facility]   If 7PM-7AM, please contact night-coverage [email or web address]  01/03/2021, 11:41 AM

## 2021-01-04 DIAGNOSIS — G9340 Encephalopathy, unspecified: Secondary | ICD-10-CM | POA: Diagnosis not present

## 2021-01-04 DIAGNOSIS — W19XXXD Unspecified fall, subsequent encounter: Secondary | ICD-10-CM

## 2021-01-04 DIAGNOSIS — N179 Acute kidney failure, unspecified: Secondary | ICD-10-CM | POA: Diagnosis not present

## 2021-01-04 DIAGNOSIS — R7989 Other specified abnormal findings of blood chemistry: Secondary | ICD-10-CM | POA: Diagnosis not present

## 2021-01-04 MED ORDER — ENSURE ENLIVE PO LIQD: 237.0000 mL | Freq: Two times a day (BID) | ORAL | 12 refills | Status: DC

## 2021-01-04 MED ORDER — LEVETIRACETAM 500 MG PO TABS: 500.0000 mg | ORAL_TABLET | Freq: Two times a day (BID) | ORAL | Status: DC

## 2021-01-04 MED ORDER — CYCLOBENZAPRINE HCL 5 MG PO TABS: 5.0000 mg | ORAL_TABLET | Freq: Three times a day (TID) | ORAL | 0 refills | Status: DC | PRN

## 2021-01-04 MED ORDER — POLYETHYLENE GLYCOL 3350 17 G PO PACK: 17.0000 g | PACK | Freq: Every day | ORAL | 0 refills | Status: DC

## 2021-01-04 MED ORDER — ALPRAZOLAM 0.25 MG PO TABS: 0.2500 mg | ORAL_TABLET | Freq: Two times a day (BID) | ORAL | 0 refills | Status: DC | PRN

## 2021-01-04 NOTE — Progress Notes (Addendum)
Attempted to call report to facility. Will try again later.   Addendum: left phone number for RN at facility to call me to get report.

## 2021-01-04 NOTE — Care Management Important Message (Signed)
Important Message  Patient Details  Name: Hannah Fisher MRN: [id number] Date of Birth: July 14, 1951   Medicare Important Message Given:  Yes - Important Message mailed due to current National Emergency  Verbal consent obtained due to current National Emergency  Relationship to patient: Self Contact Name: Azalee Call Date: 01/04/21  Time: 1339 Phone: [phone] Outcome: Spoke with contact Important Message mailed to: Patient address on file    Orson Aloe 01/04/2021, 1:40 PM

## 2021-01-04 NOTE — TOC Progression Note (Signed)
Transition of Care [facility]) - Progression Note    Patient Details  Name: Hannah Fisher MRN: [id number] Date of Birth: 08-28-1951  Transition [facility] Care [facility]) CM/SW Contact  Mearl Latin, LCSW Phone Number: 01/04/2021, 8:39 AM  Clinical Narrative:    CSW received insurance approval from [place] for patient to discharge to [facility] today: #[id number], effective 01/03/21-01/05/21. COVID test returned negative. Patient's daughter completing admission paperwork at 11am at [facility].      Barriers to Discharge: Insurance Authorization,Continued Medical Work up  [facility]   In-house Referral: Clinical Social Work   Post Acute Care Choice: IP Rehab,Skilled Nursing Facility,Home Health Living arrangements for the past 2 months: Single Family Home                                       Social Determinants of Health (SDOH) Interventions    Readmission Risk Interventions No flowsheet data found.

## 2021-01-04 NOTE — Discharge Summary (Signed)
Physician Discharge Summary  Hannah Fisher ZOX:[id number] DOB: 05-21-1951 DOA: 12/28/2020  PCP: Daisy Floro, MD  Admit date: 12/28/2020 Discharge date: 01/04/2021  Admitted From: home  Disposition:  SNF  Recommendations for Outpatient Follow-up:  1. Follow up with PCP in 1-2 weeks 2. Please obtain BMP/CBC in one week 3. Patient has follow up scheduled at [facility] neurology towards the end of May 4. She has been scheduled to follow up with urology on 4/13 at 10:45a for urinary retention   Discharge Condition:stable CODE STATUS:full code Diet recommendation: regular diet  Brief/Interim Summary: 69 year old female with a history of hypertension, admitted to the hospital after she was found laying on the floor for unknown period of time.  She was noted to be somnolent and confused.  Work-up including MRI imaging, metabolic work-up has been unrevealing.  She was noted to have rhabdomyolysis and acute kidney injury.  She is receiving IV fluids.  Holding sedating medications.  EEG abnormal with possible epileptiform discharges in left temporal area. Neurology consulted.   Discharge Diagnoses:  Principal Problem:   Acute encephalopathy Active Problems:   Rhabdomyolysis   Acute encephalopathy -Likely related to renal failure/dehydration -Daughter feels it is unlikely that patient took excess medications -MRI brain did not show any acute intracranial abnormality -Urinalysis did not show any signs of infection, urine culture negative -TSH, ammonia, B12 are unremarkable -EEG showed possible epileptiform discharges in the left and possibly right temporal areas -appreciate neurology assistance -started on keppra -subsequent LTM EEG monitoring did not show seizure activity -Overall mental status appears to be improving and back to baseline  Acute kidney injury -Suspect related to dehydration in the setting of ACE inhibitor/thiazide use as well as rhabdomyolysis -ACE inhibitor has been  held -Resolved with hydration  Urinary retention -had foley catheter placed  -abd xray with constipation which may be contributing -started on bethanecol and tamsulosin -patient failed voiding trial on 4/2 -discussed with Dr. Arita Miss on call for urology with recommendations to discharge patient with foley catheter in place and follow up in 1 week with urology  Leukocytosis -Possibly related to hemoconcentration -No obvious sources of infection at this time -Resolved with IV fluids  Hypertension -Holding ACE inhibitor as well as thiazide due to renal failure, blood pressure currently stable  Acute traumatic rhabdomyolysis -Secondary to fall -CK level peaked at 6268, repeat level down to  870 -improved with IV fluids   Fall and deconditioning -Daughter reports that patient has been falling more frequently -She does have some pain in her hips bilaterally, xray of pelvis/L hip unremarkable -Physical therapy evaluation with recommendations for SNF  Elevated LFTs -Imaging of gallbladder unrevealing -Suspect this is related to rhabdomyolysis -Hepatitis panel negative -No abdominal tenderness on exam  Hypokalemia -Replaced -Magnesium normal  Hypophosphatemia -replaced  History of chronic weakness of upper and lower extremities -B12 is normal -She is followed by an outpatient neurologist  Constipation -noted on abd xray -patient was disimpacted by nursing and has since been having bowel movements -continue daily miralax  Discharge Instructions  Discharge Instructions    Diet - low sodium heart healthy   Complete by: As directed    Increase activity slowly   Complete by: As directed      Allergies as of 01/04/2021      Reactions   Penicillin V Swelling   Penicillins Hives   Sulfa Antibiotics Rash      Medication List    STOP taking these medications   chlorthalidone 25 MG tablet  Commonly known as: HYGROTON   lisinopril 2.5 MG tablet Commonly known  as: ZESTRIL     TAKE these medications   ALPRAZolam 0.25 MG tablet Commonly known as: XANAX Take 1 tablet (0.25 mg total) by mouth 2 (two) times daily as needed for anxiety. What changed:   medication strength  how much to take   aspirin 81 MG tablet Take 81 mg by mouth daily.   b complex vitamins capsule Take 1 capsule by mouth daily.   cyclobenzaprine 5 MG tablet Commonly known as: FLEXERIL Take 1 tablet (5 mg total) by mouth 3 (three) times daily as needed for muscle spasms.   feeding supplement Liqd Take 237 mLs by mouth 2 (two) times daily between meals.   levETIRAcetam 500 MG tablet Commonly known as: KEPPRA Take 1 tablet (500 mg total) by mouth 2 (two) times daily.   magnesium oxide 400 MG tablet Commonly known as: MAG-OX Take 400 mg by mouth daily.   multivitamin tablet Take 1 tablet by mouth daily.   polyethylene glycol 17 g packet Commonly known as: MIRALAX / GLYCOLAX Take 17 g by mouth daily. Start taking on: January 05, 2021   rOPINIRole 0.25 MG tablet Commonly known as: REQUIP Take 0.125-0.25 mg by mouth as directed. Taking 1/2 to 1 tablet  1 to 3 hours before bedtime as needed for restless leg   sertraline 25 MG tablet Commonly known as: ZOLOFT Take 25 mg by mouth daily.   terbinafine 250 MG tablet Commonly known as: LAMISIL Take 250 mg by mouth daily.   Vitamin D 50 MCG (2000 UT) Caps Take 2,000 Units by mouth daily.       Contact information for after-discharge care    Destination    [facility] .   Service: Skilled Nursing Contact information: [address] [place] [address] [phone]                 Allergies  Allergen Reactions  . Penicillin V Swelling  . Penicillins Hives  . Sulfa Antibiotics Rash    Consultations:  Neurology   Procedures/Studies: DG Chest 2 View  Result Date: 12/28/2020 CLINICAL DATA:  Altered mental status, fall EXAM: CHEST - 2 VIEW  COMPARISON:  05/14/2020, 11/18/2020 FINDINGS: No focal opacity or pleural effusion. Cardiomediastinal silhouette within normal limits. Aortic atherosclerosis. No pneumothorax. IMPRESSION: No active cardiopulmonary disease. Electronically Signed   By: Jasmine Pang M.D.   On: 12/28/2020 17:00   DG Abd 1 View  Result Date: 12/29/2020 CLINICAL DATA:  Possible foreign body EXAM: ABDOMEN - 1 VIEW COMPARISON:  None. FINDINGS: Scattered large and small bowel gas is noted. Considerable retained fecal material is noted consistent with constipation. No free air is seen. No radiopaque foreign body is noted. IMPRESSION: Changes consistent with colonic constipation. No other focal abnormality is noted. Electronically Signed   By: Alcide Clever M.D.   On: 12/29/2020 02:55   CT Head Wo Contrast  Result Date: 12/28/2020 CLINICAL DATA:  Mental status change, fell, trauma EXAM: CT HEAD WITHOUT CONTRAST TECHNIQUE: Contiguous axial images were obtained from the base of the skull through the vertex without intravenous contrast. COMPARISON:  08/10/2020 FINDINGS: Brain: No acute infarct or hemorrhage. Lateral ventricles and midline structures are stable. No acute extra-axial fluid collections. No mass effect. Vascular: No hyperdense vessel or unexpected calcification. Skull: Normal. Negative for fracture or focal lesion. Sinuses/Orbits: No acute finding. Other: None. IMPRESSION: 1. No acute intracranial process. Electronically Signed   By:  Sharlet SalinaMichael  Brown M.D.   On: 12/28/2020 17:29   CT Cervical Spine Wo Contrast  Result Date: 12/28/2020 CLINICAL DATA:  Larey SeatFell, trauma EXAM: CT CERVICAL SPINE WITHOUT CONTRAST TECHNIQUE: Multidetector CT imaging of the cervical spine was performed without intravenous contrast. Multiplanar CT image reconstructions were also generated. COMPARISON:  09/07/2020 FINDINGS: Alignment: Right convex curvature of the cervical spine. Otherwise alignment is anatomic. Skull base and vertebrae: No acute  fracture. No primary bone lesion or focal pathologic process. Soft tissues and spinal canal: No prevertebral fluid or swelling. No visible canal hematoma. Disc levels: Prominent spondylosis at C4-5 and C5-6. Mild symmetrical neural foraminal encroachment at C4-5. Upper chest: Airway is patent.  Lung apices are clear. Other: Reconstructed images demonstrate no additional findings. IMPRESSION: 1. Mild cervical spondylosis and right convex scoliosis. No acute cervical spine fracture. Electronically Signed   By: Sharlet SalinaMichael  Brown M.D.   On: 12/28/2020 17:31   MR BRAIN WO CONTRAST  Result Date: 12/29/2020 CLINICAL DATA:  70 year old female with confusion, altered mental status. Weakness and tremors. EXAM: MRI HEAD WITHOUT CONTRAST TECHNIQUE: Multiplanar, multiecho pulse sequences of the brain and surrounding structures were obtained without intravenous contrast. COMPARISON:  Head CT 12/28/2020.  Brain MRI 08/10/2020. FINDINGS: Brain: Some T2 shine through is noted in the left parietal lobe white matter on series 9, image 33. No convincing restricted diffusion to suggest acute infarction. No midline shift, mass effect, evidence of mass lesion, ventriculomegaly, extra-axial collection or acute intracranial hemorrhage. Cervicomedullary junction and pituitary are within normal limits. Generally mild for age nonspecific white matter T2 and FLAIR hyperintensity, which is most pronounced in the left parietal lobe. No chronic cerebral blood products. No cortical encephalomalacia identified. Deep gray matter nuclei, brainstem and cerebellum are within normal limits for age. Vascular: Major intracranial vascular flow voids are stable since November with generalized intracranial artery tortuosity. Skull and upper cervical spine: Stable and negative for age visible cervical spine. Normal visible bone marrow signal. Sinuses/Orbits: Stable, negative. Other: Mastoids remain clear. Grossly stable and normal visible internal auditory  structures. Superior vertex and left posterior convexity scalp hematoma best demonstrated on series 7, images 93, 85. IMPRESSION: 1. No acute intracranial abnormality. Stable noncontrast MRI appearance of the brain since November with mild for age white matter changes, most commonly due to small vessel disease. 2. Superior and left posterior convexity scalp hematoma. Electronically Signed   By: Odessa FlemingH  Hall M.D.   On: 12/29/2020 05:09   DG Pelvis Portable  Result Date: 12/29/2020 CLINICAL DATA:  Fall. EXAM: PORTABLE PELVIS 1-2 VIEWS COMPARISON:  None. FINDINGS: The cortical margins of the bony pelvis are intact. No fracture. Pubic symphysis and sacroiliac joints are congruent. Both femoral heads are well-seated in the respective acetabula. IMPRESSION: No pelvic fracture. Electronically Signed   By: Narda RutherfordMelanie  Sanford M.D.   On: 12/29/2020 16:55   DG Abd Portable 1V  Result Date: 12/30/2020 CLINICAL DATA:  Urinary retention EXAM: PORTABLE ABDOMEN - 1 VIEW COMPARISON:  December 29, 2020 FINDINGS: Moderate air noted in small bowel. No bowel dilatation or air-fluid level to suggest bowel obstruction. No free air. There is fairly diffuse stool throughout colon. No abnormal calcifications. IMPRESSION: Fairly diffuse stool throughout colon. There may be a degree of underlying constipation. No bowel obstruction or free air evident. Electronically Signed   By: Bretta BangWilliam  Woodruff III M.D.   On: 12/30/2020 12:25   EEG adult  Result Date: 12/29/2020 Lilian ComaMeyers, Clifford W, MD     12/29/2020  7:09 PM EEG  Report Indication: possible seizure activity This study was recorded in the waiting and drowsy state.  The duration of the study was 26 minutes.  Electrodes were placed according to the International 10/20 system.  Video was reviewed/available for clinical correlation as needed. In the waking state, discernible but diminished background organization is seen with a mildly attenuated anterior - posterior voltage and frequency  gradient.  In the occipital leads there was a symmetric and reactive although indistinct and poorly sustained posterior dominant rhythm of approximately 7 hertz, which is slower than expected for age  Anteriorly, is the expected pattern of faster frequency, lower voltage waveforms, although in a reduced amount as compared to normal. During the drowsy state, there is further attenuation of the gradient, a general shift to slower frequencies diffusely, a waxing and waning of the posterior dominant rhythm, and slow roving eye movements. There were intermittently typically isolated Spike waveforms seen in the left temporal region, and very occasionally the right. In addition, there is some degree of slowing in the bitemporal regions, although to a very modest degree.  On occasion the spike waves have an aftergoing slow wave (e.g.,  at 11:37:01 of the recording)).  These are strongly favored to be epileptiform discharges. Hyperventilation: deferred Photic stimulation: deferred Impression: This is an abnormal waiting and drowsy study due to 1) left temporal (and possibly very occasionally right temporal) slowing and definitively epileptiform discharges (with an aftergoing slow wave distinguishing from wickets). This finding would be suggestive of an epileptiform foci in the left temporal area (possibly relatively deep with occasional representation in the contralateral hemisphere on surface EEG).  This is superimposed upon diminished background organization as described above, most clinically compatible with a mild diffuse encephalopathy.  There were no seizures however.   DG HIP UNILAT WITH PELVIS 2-3 VIEWS LEFT  Result Date: 01/01/2021 CLINICAL DATA:  Left hip pain after a fall. EXAM: DG HIP (WITH OR WITHOUT PELVIS) 2-3V LEFT COMPARISON:  None. FINDINGS: Degenerative changes with the left hip with acetabular joint space narrowing, sclerosis, and small osteophyte formation. No evidence of acute fracture or  dislocation. No focal bone lesions. SI joints and symphysis pubis are not displaced. Infiltration in the subcutaneous fat lateral to the left hip suggesting soft tissue contusion. IMPRESSION: Degenerative changes in the left hip. No acute fracture or dislocation. Electronically Signed   By: Burman Nieves M.D.   On: 01/01/2021 21:49   US Abdomen Limited RUQ (LIVER/GB)  Result Date: 12/29/2020 CLINICAL DATA:  Elevated liver function tests EXAM: ULTRASOUND ABDOMEN LIMITED RIGHT UPPER QUADRANT COMPARISON:  None. FINDINGS: Gallbladder: No gallstones or wall thickening visualized. No sonographic Murphy sign noted by sonographer. Common bile duct: Diameter: 4 mm in proximal diameter Liver: No focal lesion identified. Within normal limits in parenchymal echogenicity. Portal vein is patent on color Doppler imaging with normal direction of blood flow towards the liver. Other: None. IMPRESSION: Normal right upper quadrant sonogram. Electronically Signed   By: Helyn Numbers MD   On: 12/29/2020 06:29       Subjective: She feels weak. She says she gets full quickly when she eats  Discharge Exam: Vitals:   01/03/21 0800 01/03/21 1500 01/03/21 2041 01/04/21 0448  BP: 131/81 138/86 (!) 148/98 113/79  Pulse: 92 (!) 109 100 87  Resp: Temp:  98 F (36.7 C) 98.1 F (36.7 C) 98 F (36.7 C)  TempSrc:  Oral Oral Oral  SpO2: 97% 98% 97% 96%  Weight:  Height:        General: Pt is alert, awake, not in acute distress Cardiovascular: RRR, S1/S2 +, no rubs, no gallops Respiratory: CTA bilaterally, no wheezing, no rhonchi Abdominal: Soft, NT, ND, bowel sounds + Extremities: no edema, no cyanosis    The results of significant diagnostics from this hospitalization (including imaging, microbiology, ancillary and laboratory) are listed below for reference.     Microbiology: Recent Results (from the past 240 hour(s))  Culture, blood (routine x 2)     Status: None   Collection Time:  12/28/20  9:40 PM   Specimen: BLOOD  Result Value Ref Range Status   Specimen Description BLOOD SITE NOT SPECIFIED  Final   Special Requests   Final    BOTTLES DRAWN AEROBIC AND ANAEROBIC Blood Culture adequate volume   Culture   Final    NO GROWTH 5 DAYS Performed at [facility], [address] [place], [place] [address]    Report Status 01/02/2021 FINAL  Final  Culture, blood (routine x 2)     Status: None   Collection Time: 12/28/20  9:40 PM   Specimen: BLOOD  Result Value Ref Range Status   Specimen Description BLOOD SITE NOT SPECIFIED  Final   Special Requests   Final    BOTTLES DRAWN AEROBIC AND ANAEROBIC Blood Culture adequate volume   Culture   Final    NO GROWTH 5 DAYS Performed at [facility], [address], [place], [place] [address]    Report Status 01/02/2021 FINAL  Final  Resp Panel by RT-PCR (Flu A&B, Covid) Nasopharyngeal Swab     Status: None   Collection Time: 12/28/20 10:10 PM   Specimen: Nasopharyngeal Swab; Nasopharyngeal(NP) swabs in vial transport medium  Result Value Ref Range Status   SARS Coronavirus 2 by RT PCR NEGATIVE NEGATIVE Final    Comment: (NOTE) SARS-CoV-2 target nucleic acids are NOT DETECTED.  The SARS-CoV-2 RNA is generally detectable in upper respiratory specimens during the acute phase of infection. The lowest concentration of SARS-CoV-2 viral copies this assay can detect is 138 copies/mL. A negative result does not preclude SARS-Cov-2 infection and should not be used as the sole basis for treatment or other patient management decisions. A negative result may occur with  improper specimen collection/handling, submission of specimen other than nasopharyngeal swab, presence of viral mutation(s) within the areas targeted by this assay, and inadequate number of viral copies(<138 copies/mL). A negative result must be combined with clinical observations, patient history, and epidemiological information. The expected result is  Negative.  Fact Sheet for Patients:  BloggerCourse.com  Fact Sheet for Healthcare Providers:  SeriousBroker.it  This test is no t yet approved or cleared by the [place] FDA and  has been authorized for detection and/or diagnosis of SARS-CoV-2 by FDA under an Emergency Use Authorization (EUA). This EUA will remain  in effect (meaning this test can be used) for the duration of the COVID-19 declaration under Section 564(b)(1) of the Act, 21 U.S.C.section 360bbb-3(b)(1), unless the authorization is terminated  or revoked sooner.       Influenza A by PCR NEGATIVE NEGATIVE Final   Influenza B by PCR NEGATIVE NEGATIVE Final    Comment: (NOTE) The Xpert Xpress SARS-CoV-2/FLU/RSV plus assay is intended as an aid in the diagnosis of influenza from Nasopharyngeal swab specimens and should not be used as a sole basis for treatment. Nasal washings and aspirates are unacceptable for Xpert Xpress SARS-CoV-2/FLU/RSV testing.  Fact Sheet for  Patients: BloggerCourse.com  Fact Sheet for Healthcare Providers: SeriousBroker.it  This test is not yet approved or cleared by the [place] FDA and has been authorized for detection and/or diagnosis of SARS-CoV-2 by FDA under an Emergency Use Authorization (EUA). This EUA will remain in effect (meaning this test can be used) for the duration of the COVID-19 declaration under Section 564(b)(1) of the Act, 21 U.S.C. section 360bbb-3(b)(1), unless the authorization is terminated or revoked.  Performed at [facility], [address], [place], [place] [address]   Culture, Urine     Status: None   Collection Time: 12/30/20  5:45 AM   Specimen: Urine, Catheterized  Result Value Ref Range Status   Specimen Description URINE, CATHETERIZED  Final   Special Requests NONE  Final   Culture   Final    NO GROWTH Performed at [facility]  Lab, [address], [place], [place] [address]    Report Status 12/31/2020 FINAL  Final  SARS CORONAVIRUS 2 (TAT 6-24 HRS) Nasopharyngeal Nasopharyngeal Swab     Status: None   Collection Time: 01/03/21  2:52 PM   Specimen: Nasopharyngeal Swab  Result Value Ref Range Status   SARS Coronavirus 2 NEGATIVE NEGATIVE Final    Comment: (NOTE) SARS-CoV-2 target nucleic acids are NOT DETECTED.  The SARS-CoV-2 RNA is generally detectable in upper and lower respiratory specimens during the acute phase of infection. Negative results do not preclude SARS-CoV-2 infection, do not rule out co-infections with other pathogens, and should not be used as the sole basis for treatment or other patient management decisions. Negative results must be combined with clinical observations, patient history, and epidemiological information. The expected result is Negative.  Fact Sheet for Patients: HairSlick.no  Fact Sheet for Healthcare Providers: quierodirigir.com  This test is not yet approved or cleared by the [place] FDA and  has been authorized for detection and/or diagnosis of SARS-CoV-2 by FDA under an Emergency Use Authorization (EUA). This EUA will remain  in effect (meaning this test can be used) for the duration of the COVID-19 declaration under Se ction 564(b)(1) of the Act, 21 U.S.C. section 360bbb-3(b)(1), unless the authorization is terminated or revoked sooner.  Performed at [facility], [address], [place], [place] [address]      Labs: BNP (last 3 results) No results for input(s): BNP in the last 8760 hours. Basic Metabolic Panel: Recent Labs  Lab 12/30/20 0043 12/30/20 2005 12/31/20 0019 01/01/21 0230 01/02/21 1106 01/03/21 0141  NA 146*  --  139 134* 139 135  K 3.6  --  3.1* 3.3* 3.4* 3.7  CL 108  --  102 102 105 103  CO2 32  --  32 25 28 26   GLUCOSE 172*  --  112* 111* 131* 106*  BUN 35*  --  20 15 12 16    CREATININE 0.79  --  0.59 0.54 0.65 0.67  CALCIUM 9.1  --  8.4* 8.4* 8.8* 8.7*  MG 2.6*  --   --   --   --   --   PHOS 1.1* 2.8 2.3* 2.4* 3.0 3.3   Liver Function Tests: Recent Labs  Lab 12/28/20 1631 12/29/20 0311 12/30/20 0043 12/31/20 0019 01/01/21 0230 01/02/21 1106 01/03/21 0141  AST 142* 171*  --   --   --   --   --   ALT 66* 74*  --   --   --   --   --   03/04/21  69 59  --   --   --   --   --   BILITOT 1.6* 1.5*  --   --   --   --   --   PROT 6.7 5.5*  --   --   --   --   --   ALBUMIN 4.1 3.3* 2.8* 2.5* 2.2* 2.3* 2.2*   No results for input(s): LIPASE, AMYLASE in the last 168 hours. Recent Labs  Lab 12/29/20 0311  AMMONIA 32   CBC: Recent Labs  Lab 12/28/20 1631 12/29/20 0311 12/30/20 0043 12/31/20 0019  WBC 21.8* 19.5* 13.2* 8.5  NEUTROABS 19.6*  --  11.7*  --   HGB 14.1 12.5 11.4* 10.2*  HCT 43.0 37.4 34.1* 30.9*  MCV 93.5 92.1 91.9 94.2  PLT 350 281 227 188   Cardiac Enzymes: Recent Labs  Lab 12/28/20 1631 12/29/20 0311 12/30/20 0043 12/31/20 0019 01/01/21 0230  CKTOTAL 5,980* 6,268* 3,843* 2,150* 870*   BNP: Invalid input(s): POCBNP CBG: Recent Labs  Lab 12/28/20 1741  GLUCAP 140*   D-Dimer No results for input(s): DDIMER in the last 72 hours. Hgb A1c No results for input(s): HGBA1C in the last 72 hours. Lipid Profile No results for input(s): CHOL, HDL, LDLCALC, TRIG, CHOLHDL, LDLDIRECT in the last 72 hours. Thyroid function studies No results for input(s): TSH, T4TOTAL, T3FREE, THYROIDAB in the last 72 hours.  Invalid input(s): FREET3 Anemia work up No results for input(s): VITAMINB12, FOLATE, FERRITIN, TIBC, IRON, RETICCTPCT in the last 72 hours. Urinalysis    Component Value Date/Time   COLORURINE YELLOW 12/28/2020 1950   APPEARANCEUR CLEAR 12/28/2020 1950   LABSPEC 1.017 12/28/2020 1950   PHURINE 5.0 12/28/2020 1950   GLUCOSEU NEGATIVE 12/28/2020 1950   HGBUR MODERATE (A) 12/28/2020 1950   BILIRUBINUR NEGATIVE 12/28/2020  1950   KETONESUR 80 (A) 12/28/2020 1950   PROTEINUR NEGATIVE 12/28/2020 1950   UROBILINOGEN 0.2 03/03/2014 1640   NITRITE NEGATIVE 12/28/2020 1950   LEUKOCYTESUR NEGATIVE 12/28/2020 1950   Sepsis Labs Invalid input(s): PROCALCITONIN,  WBC,  LACTICIDVEN Microbiology Recent Results (from the past 240 hour(s))  Culture, blood (routine x 2)     Status: None   Collection Time: 12/28/20  9:40 PM   Specimen: BLOOD  Result Value Ref Range Status   Specimen Description BLOOD SITE NOT SPECIFIED  Final   Special Requests   Final    BOTTLES DRAWN AEROBIC AND ANAEROBIC Blood Culture adequate volume   Culture   Final    NO GROWTH 5 DAYS Performed at [facility], [address], [place], [place] [address]    Report Status 01/02/2021 FINAL  Final  Culture, blood (routine x 2)     Status: None   Collection Time: 12/28/20  9:40 PM   Specimen: BLOOD  Result Value Ref Range Status   Specimen Description BLOOD SITE NOT SPECIFIED  Final   Special Requests   Final    BOTTLES DRAWN AEROBIC AND ANAEROBIC Blood Culture adequate volume   Culture   Final    NO GROWTH 5 DAYS Performed at [facility], [address], [place], [place] [address]    Report Status 01/02/2021 FINAL  Final  Resp Panel by RT-PCR (Flu A&B, Covid) Nasopharyngeal Swab     Status: None   Collection Time: 12/28/20 10:10 PM   Specimen: Nasopharyngeal Swab; Nasopharyngeal(NP) swabs in vial transport medium  Result Value Ref Range Status   SARS Coronavirus 2 by RT PCR NEGATIVE  NEGATIVE Final    Comment: (NOTE) SARS-CoV-2 target nucleic acids are NOT DETECTED.  The SARS-CoV-2 RNA is generally detectable in upper respiratory specimens during the acute phase of infection. The lowest concentration of SARS-CoV-2 viral copies this assay can detect is 138 copies/mL. A negative result does not preclude SARS-Cov-2 infection and should not be used as the sole basis for treatment or other patient management decisions. A  negative result may occur with  improper specimen collection/handling, submission of specimen other than nasopharyngeal swab, presence of viral mutation(s) within the areas targeted by this assay, and inadequate number of viral copies(<138 copies/mL). A negative result must be combined with clinical observations, patient history, and epidemiological information. The expected result is Negative.  Fact Sheet for Patients:  BloggerCourse.com  Fact Sheet for Healthcare Providers:  SeriousBroker.it  This test is no t yet approved or cleared by the [place] FDA and  has been authorized for detection and/or diagnosis of SARS-CoV-2 by FDA under an Emergency Use Authorization (EUA). This EUA will remain  in effect (meaning this test can be used) for the duration of the COVID-19 declaration under Section 564(b)(1) of the Act, 21 U.S.C.section 360bbb-3(b)(1), unless the authorization is terminated  or revoked sooner.       Influenza A by PCR NEGATIVE NEGATIVE Final   Influenza B by PCR NEGATIVE NEGATIVE Final    Comment: (NOTE) The Xpert Xpress SARS-CoV-2/FLU/RSV plus assay is intended as an aid in the diagnosis of influenza from Nasopharyngeal swab specimens and should not be used as a sole basis for treatment. Nasal washings and aspirates are unacceptable for Xpert Xpress SARS-CoV-2/FLU/RSV testing.  Fact Sheet for Patients: BloggerCourse.com  Fact Sheet for Healthcare Providers: SeriousBroker.it  This test is not yet approved or cleared by the [place] FDA and has been authorized for detection and/or diagnosis of SARS-CoV-2 by FDA under an Emergency Use Authorization (EUA). This EUA will remain in effect (meaning this test can be used) for the duration of the COVID-19 declaration under Section 564(b)(1) of the Act, 21 U.S.C. section 360bbb-3(b)(1), unless the authorization  is terminated or revoked.  Performed at [facility], [address] [place], [place] [address]   Culture, Urine     Status: None   Collection Time: 12/30/20  5:45 AM   Specimen: Urine, Catheterized  Result Value Ref Range Status   Specimen Description URINE, CATHETERIZED  Final   Special Requests NONE  Final   Culture   Final    NO GROWTH Performed at [facility], [address] [place], [place] [address]    Report Status 12/31/2020 FINAL  Final  SARS CORONAVIRUS 2 (TAT 6-24 HRS) Nasopharyngeal Nasopharyngeal Swab     Status: None   Collection Time: 01/03/21  2:52 PM   Specimen: Nasopharyngeal Swab  Result Value Ref Range Status   SARS Coronavirus 2 NEGATIVE NEGATIVE Final    Comment: (NOTE) SARS-CoV-2 target nucleic acids are NOT DETECTED.  The SARS-CoV-2 RNA is generally detectable in upper and lower respiratory specimens during the acute phase of infection. Negative results do not preclude SARS-CoV-2 infection, do not rule out co-infections with other pathogens, and should not be used as the sole basis for treatment or other patient management decisions. Negative results must be combined with clinical observations, patient history, and epidemiological information. The expected result is Negative.  Fact Sheet for Patients: HairSlick.no  Fact Sheet for Healthcare Providers: quierodirigir.com  This test is not yet approved or cleared by the  [place] [employer] trader and  has been authorized for detection and/or diagnosis of SARS-CoV-2 by FDA under an TEFL teacher (EUA). This EUA will remain  in effect (meaning this test can be used) for the duration of the COVID-19 declaration under Se ction 564(b)(1) of the Act, 21 U.S.C. section 360bbb-3(b)(1), unless the authorization is terminated or revoked sooner.  Performed at [facility], [address], [place], [place] [address]      Time  coordinating discharge:  SIGNED:   Erick Blinks, MD  [facility] 01/04/2021, 9:22 AM   If 7PM-7AM, please contact night-coverage [email or web address]

## 2021-01-04 NOTE — Progress Notes (Signed)
[name] to be D/C'd Skilled nursing facility per MD order.  Discussed with the patient and all questions fully answered.  VSS, Skin clean, dry and intact without evidence of skin break down, blister on left foot. IV catheters discontinued intact. Site without signs and symptoms of complications. Dressing and pressure applied.  An After Visit Summary was printed and given to PTAR. Report called and given to Bluementhals RN.  Patient escorted via PTAR.  Hannah Fisher 01/04/2021 2:56 PM

## 2021-01-04 NOTE — Progress Notes (Signed)
Report given to bluementhal RN

## 2021-01-04 NOTE — TOC Transition Note (Signed)
Transition of Care [facility]) - CM/SW Discharge Note   Patient Details  Name: MEGUMI TREASTER MRN: [id number] Date of Birth: 03/01/1951  [facility] CM/SW Contact:  Mearl Latin, LCSW Phone Number: 01/04/2021, 11:47 AM   Clinical Narrative:    Patient will DC to: [facility] Anticipated DC date: 01/04/21 Family notified: Daughter, Data processing manager by: Sharin Mons   Per MD patient ready for DC to [facility]. RN to call report prior to discharge [phone] room 3222). RN, patient, patient's family, and facility notified of DC. Discharge Summary and FL2 sent to facility. DC packet on chart. Ambulance transport requested for patient.   CSW will sign off for now as social work intervention is no longer needed. Please consult [place] again if new needs arise.      Final next level of care: Skilled Nursing Facility Barriers to Discharge: Barriers Resolved   Patient Goals and CMS Choice Patient states their goals for this hospitalization and ongoing recovery are:: Rehab CMS Medicare.gov Compare Post Acute Care list provided to:: Patient Represenative (must comment) Choice offered to / list presented to : Adult Children  Discharge Placement   Existing PASRR number confirmed : 01/04/21          Patient chooses bed at: [facility] Patient to be transferred to facility by: PTAR Name of family member notified: Daughter, [name] Patient and family notified of of transfer: 01/04/21  Discharge Plan and Services In-house Referral: Clinical Social Work   Post Acute Care Choice: IP Rehab,Skilled Nursing Facility,Home Health                               Social Determinants of Health (SDOH) Interventions     Readmission Risk Interventions No flowsheet data found.

## 2021-01-05 DIAGNOSIS — G9340 Encephalopathy, unspecified: Secondary | ICD-10-CM | POA: Diagnosis not present

## 2021-01-05 DIAGNOSIS — M6282 Rhabdomyolysis: Secondary | ICD-10-CM | POA: Diagnosis not present

## 2021-01-05 DIAGNOSIS — R269 Unspecified abnormalities of gait and mobility: Secondary | ICD-10-CM | POA: Diagnosis not present

## 2021-01-05 DIAGNOSIS — K5900 Constipation, unspecified: Secondary | ICD-10-CM | POA: Diagnosis not present

## 2021-01-05 DIAGNOSIS — N179 Acute kidney failure, unspecified: Secondary | ICD-10-CM | POA: Diagnosis not present

## 2021-01-05 DIAGNOSIS — G2581 Restless legs syndrome: Secondary | ICD-10-CM | POA: Diagnosis not present

## 2021-01-05 DIAGNOSIS — I10 Essential (primary) hypertension: Secondary | ICD-10-CM | POA: Diagnosis not present

## 2021-01-12 DIAGNOSIS — R339 Retention of urine, unspecified: Secondary | ICD-10-CM | POA: Diagnosis not present

## 2021-01-13 DIAGNOSIS — R339 Retention of urine, unspecified: Secondary | ICD-10-CM | POA: Diagnosis not present

## 2021-01-13 DIAGNOSIS — I10 Essential (primary) hypertension: Secondary | ICD-10-CM | POA: Diagnosis not present

## 2021-01-13 DIAGNOSIS — K5900 Constipation, unspecified: Secondary | ICD-10-CM | POA: Diagnosis not present

## 2021-01-13 DIAGNOSIS — M6281 Muscle weakness (generalized): Secondary | ICD-10-CM | POA: Diagnosis not present

## 2021-01-18 DIAGNOSIS — F419 Anxiety disorder, unspecified: Secondary | ICD-10-CM | POA: Diagnosis not present

## 2021-01-18 DIAGNOSIS — F339 Major depressive disorder, recurrent, unspecified: Secondary | ICD-10-CM | POA: Diagnosis not present

## 2021-01-24 ENCOUNTER — Ambulatory Visit: Payer: Medicare Other | Admitting: Podiatry

## 2021-01-24 ENCOUNTER — Encounter: Payer: Self-pay | Admitting: Podiatry

## 2021-01-24 ENCOUNTER — Other Ambulatory Visit: Payer: Self-pay

## 2021-01-24 DIAGNOSIS — G2581 Restless legs syndrome: Secondary | ICD-10-CM | POA: Insufficient documentation

## 2021-01-24 DIAGNOSIS — B351 Tinea unguium: Secondary | ICD-10-CM | POA: Diagnosis not present

## 2021-01-24 DIAGNOSIS — J3081 Allergic rhinitis due to animal (cat) (dog) hair and dander: Secondary | ICD-10-CM | POA: Insufficient documentation

## 2021-01-24 DIAGNOSIS — G9340 Encephalopathy, unspecified: Secondary | ICD-10-CM | POA: Diagnosis not present

## 2021-01-24 DIAGNOSIS — R296 Repeated falls: Secondary | ICD-10-CM | POA: Diagnosis not present

## 2021-01-24 DIAGNOSIS — K625 Hemorrhage of anus and rectum: Secondary | ICD-10-CM | POA: Insufficient documentation

## 2021-01-24 DIAGNOSIS — E7800 Pure hypercholesterolemia, unspecified: Secondary | ICD-10-CM | POA: Insufficient documentation

## 2021-01-24 DIAGNOSIS — M5430 Sciatica, unspecified side: Secondary | ICD-10-CM | POA: Insufficient documentation

## 2021-01-24 DIAGNOSIS — J309 Allergic rhinitis, unspecified: Secondary | ICD-10-CM | POA: Insufficient documentation

## 2021-01-24 DIAGNOSIS — E46 Unspecified protein-calorie malnutrition: Secondary | ICD-10-CM | POA: Insufficient documentation

## 2021-01-24 DIAGNOSIS — R9389 Abnormal findings on diagnostic imaging of other specified body structures: Secondary | ICD-10-CM | POA: Insufficient documentation

## 2021-01-24 DIAGNOSIS — K5909 Other constipation: Secondary | ICD-10-CM | POA: Insufficient documentation

## 2021-01-24 DIAGNOSIS — R339 Retention of urine, unspecified: Secondary | ICD-10-CM | POA: Diagnosis not present

## 2021-01-24 DIAGNOSIS — M6282 Rhabdomyolysis: Secondary | ICD-10-CM | POA: Diagnosis not present

## 2021-01-24 DIAGNOSIS — R269 Unspecified abnormalities of gait and mobility: Secondary | ICD-10-CM | POA: Insufficient documentation

## 2021-01-24 DIAGNOSIS — G479 Sleep disorder, unspecified: Secondary | ICD-10-CM | POA: Insufficient documentation

## 2021-01-24 DIAGNOSIS — E559 Vitamin D deficiency, unspecified: Secondary | ICD-10-CM | POA: Insufficient documentation

## 2021-01-24 DIAGNOSIS — F321 Major depressive disorder, single episode, moderate: Secondary | ICD-10-CM | POA: Diagnosis not present

## 2021-01-24 DIAGNOSIS — F419 Anxiety disorder, unspecified: Secondary | ICD-10-CM | POA: Insufficient documentation

## 2021-01-24 DIAGNOSIS — I10 Essential (primary) hypertension: Secondary | ICD-10-CM | POA: Diagnosis not present

## 2021-01-24 DIAGNOSIS — F418 Other specified anxiety disorders: Secondary | ICD-10-CM | POA: Diagnosis not present

## 2021-01-24 DIAGNOSIS — K2970 Gastritis, unspecified, without bleeding: Secondary | ICD-10-CM | POA: Insufficient documentation

## 2021-01-24 MED ORDER — CICLOPIROX 8 % EX SOLN: Freq: Every day | CUTANEOUS | 2 refills | Status: DC

## 2021-01-24 NOTE — Patient Instructions (Signed)
Ciclopirox nail solution What is this medicine? CICLOPIROX (sye kloe PEER ox) NAIL SOLUTION is an antifungal medicine. It used to treat fungal infections of the nails. This medicine may be used for other purposes; ask your health care provider or pharmacist if you have questions. COMMON BRAND NAME(S): CNL8, Penlac What should I tell my health care provider before I take this medicine? They need to know if you have any of these conditions:  diabetes mellitus  history of seizures  HIV infection  immune system problems or organ transplant  large areas of burned or damaged skin  peripheral vascular disease or poor circulation  taking corticosteroid medication (including steroid inhalers, cream, or lotion)  an unusual or allergic reaction to ciclopirox, isopropyl alcohol, other medicines, foods, dyes, or preservatives  pregnant or trying to get pregnant  breast-feeding How should I use this medicine? This medicine is for external use only. Follow the directions that come with this medicine exactly. Wash and dry your hands before use. Avoid contact with the eyes, mouth or nose. If you do get this medicine in your eyes, rinse out with plenty of cool tap water. Contact your doctor or health care professional if eye irritation occurs. Use at regular intervals. Do not use your medicine more often than directed. Finish the full course prescribed by your doctor or health care professional even if you think you are better. Do not stop using except on your doctor's advice. Talk to your pediatrician regarding the use of this medicine in children. While this medicine may be prescribed for children as young as 12 years for selected conditions, precautions do apply. Overdosage: If you think you have taken too much of this medicine contact a poison control center or emergency room at once. NOTE: This medicine is only for you. Do not share this medicine with others. What if I miss a dose? If you miss a  dose, use it as soon as you can. If it is almost time for your next dose, use only that dose. Do not use double or extra doses. What may interact with this medicine? Interactions are not expected. Do not use any other skin products without telling your doctor or health care professional. This list may not describe all possible interactions. Give your health care provider a list of all the medicines, herbs, non-prescription drugs, or dietary supplements you use. Also tell them if you smoke, drink alcohol, or use illegal drugs. Some items may interact with your medicine. What should I watch for while using this medicine? Tell your doctor or health care professional if your symptoms get worse. Four to six months of treatment may be needed for the nail(s) to improve. Some people may not achieve a complete cure or clearing of the nails by this time. Tell your doctor or health care professional if you develop sores or blisters that do not heal properly. If your nail infection returns after stopping using this product, contact your doctor or health care professional. What side effects may I notice from receiving this medicine? Side effects that you should report to your doctor or health care professional as soon as possible:  allergic reactions like skin rash, itching or hives, swelling of the face, lips, or tongue  severe irritation, redness, burning, blistering, peeling, swelling, oozing Side effects that usually do not require medical attention (report to your doctor or health care professional if they continue or are bothersome):  mild reddening of the skin  nail discoloration  temporary burning or mild   stinging at the site of application This list may not describe all possible side effects. Call your doctor for medical advice about side effects. You may report side effects to FDA at 1-800-FDA-1088. Where should I keep my medicine? Keep out of the reach of children. Store at room temperature  between 15 and 30 degrees C (59 and 86 degrees F). Do not freeze. Protect from light by storing the bottle in the carton after every use. This medicine is flammable. Keep away from heat and flame. Throw away any unused medicine after the expiration date. NOTE: This sheet is a summary. It may not cover all possible information. If you have questions about this medicine, talk to your doctor, pharmacist, or health care provider.  2021 Elsevier/Gold Standard (2007-12-23 16:49:20)  

## 2021-01-25 DIAGNOSIS — R296 Repeated falls: Secondary | ICD-10-CM | POA: Diagnosis not present

## 2021-01-25 DIAGNOSIS — F418 Other specified anxiety disorders: Secondary | ICD-10-CM | POA: Diagnosis not present

## 2021-01-25 DIAGNOSIS — R339 Retention of urine, unspecified: Secondary | ICD-10-CM | POA: Diagnosis not present

## 2021-01-25 DIAGNOSIS — G9340 Encephalopathy, unspecified: Secondary | ICD-10-CM | POA: Diagnosis not present

## 2021-01-25 DIAGNOSIS — M6282 Rhabdomyolysis: Secondary | ICD-10-CM | POA: Diagnosis not present

## 2021-01-25 DIAGNOSIS — F321 Major depressive disorder, single episode, moderate: Secondary | ICD-10-CM | POA: Diagnosis not present

## 2021-01-25 DIAGNOSIS — I10 Essential (primary) hypertension: Secondary | ICD-10-CM | POA: Diagnosis not present

## 2021-01-26 DIAGNOSIS — F418 Other specified anxiety disorders: Secondary | ICD-10-CM | POA: Diagnosis not present

## 2021-01-26 DIAGNOSIS — I10 Essential (primary) hypertension: Secondary | ICD-10-CM | POA: Diagnosis not present

## 2021-01-26 DIAGNOSIS — R339 Retention of urine, unspecified: Secondary | ICD-10-CM | POA: Diagnosis not present

## 2021-01-26 DIAGNOSIS — G9340 Encephalopathy, unspecified: Secondary | ICD-10-CM | POA: Diagnosis not present

## 2021-01-26 DIAGNOSIS — F321 Major depressive disorder, single episode, moderate: Secondary | ICD-10-CM | POA: Diagnosis not present

## 2021-01-26 DIAGNOSIS — R296 Repeated falls: Secondary | ICD-10-CM | POA: Diagnosis not present

## 2021-01-26 DIAGNOSIS — M6282 Rhabdomyolysis: Secondary | ICD-10-CM | POA: Diagnosis not present

## 2021-01-27 DIAGNOSIS — G9340 Encephalopathy, unspecified: Secondary | ICD-10-CM | POA: Diagnosis not present

## 2021-01-27 DIAGNOSIS — R296 Repeated falls: Secondary | ICD-10-CM | POA: Diagnosis not present

## 2021-01-27 DIAGNOSIS — F418 Other specified anxiety disorders: Secondary | ICD-10-CM | POA: Diagnosis not present

## 2021-01-27 DIAGNOSIS — M6282 Rhabdomyolysis: Secondary | ICD-10-CM | POA: Diagnosis not present

## 2021-01-27 DIAGNOSIS — R339 Retention of urine, unspecified: Secondary | ICD-10-CM | POA: Diagnosis not present

## 2021-01-27 DIAGNOSIS — I10 Essential (primary) hypertension: Secondary | ICD-10-CM | POA: Diagnosis not present

## 2021-01-27 DIAGNOSIS — F321 Major depressive disorder, single episode, moderate: Secondary | ICD-10-CM | POA: Diagnosis not present

## 2021-01-28 DIAGNOSIS — G40909 Epilepsy, unspecified, not intractable, without status epilepticus: Secondary | ICD-10-CM | POA: Diagnosis not present

## 2021-01-28 DIAGNOSIS — I10 Essential (primary) hypertension: Secondary | ICD-10-CM | POA: Diagnosis not present

## 2021-01-28 DIAGNOSIS — G20A1 Parkinson's disease without dyskinesia, without mention of fluctuations: Secondary | ICD-10-CM | POA: Insufficient documentation

## 2021-01-28 DIAGNOSIS — R339 Retention of urine, unspecified: Secondary | ICD-10-CM | POA: Diagnosis not present

## 2021-01-28 DIAGNOSIS — M6282 Rhabdomyolysis: Secondary | ICD-10-CM | POA: Diagnosis not present

## 2021-01-28 DIAGNOSIS — G20 Parkinson's disease: Secondary | ICD-10-CM | POA: Diagnosis not present

## 2021-01-28 DIAGNOSIS — F321 Major depressive disorder, single episode, moderate: Secondary | ICD-10-CM | POA: Diagnosis not present

## 2021-01-28 DIAGNOSIS — F418 Other specified anxiety disorders: Secondary | ICD-10-CM | POA: Diagnosis not present

## 2021-01-28 DIAGNOSIS — M6281 Muscle weakness (generalized): Secondary | ICD-10-CM | POA: Diagnosis not present

## 2021-01-28 DIAGNOSIS — G9340 Encephalopathy, unspecified: Secondary | ICD-10-CM | POA: Diagnosis not present

## 2021-01-28 DIAGNOSIS — R296 Repeated falls: Secondary | ICD-10-CM | POA: Diagnosis not present

## 2021-01-28 DIAGNOSIS — R29898 Other symptoms and signs involving the musculoskeletal system: Secondary | ICD-10-CM | POA: Diagnosis not present

## 2021-01-28 DIAGNOSIS — R200 Anesthesia of skin: Secondary | ICD-10-CM | POA: Diagnosis not present

## 2021-01-28 NOTE — Progress Notes (Signed)
Subjective:   Patient ID: Hannah Fisher, female   DOB: 70 y.o.   MRN: [id number]   HPI 70 year old female presents the office today for concerns of underlying nail fungus.  She thinks it is almost gone hopefully.  She said the dermatologist a couple of big toenail but 2 months ago had a culture.  She was on Lamisil which did seem to help.  She denies any pain in the nails, denies any redness or drainage or any swelling.  She has no other concerns.   Review of Systems  All other systems reviewed and are negative.  Past Medical History:  Diagnosis Date  . Arthritis    hands, knees  . Deafness    left ear sue to shingles  . Hypertension   . Kidney stones   . Osteoporosis 2009   -2.6  . Shingles     Past Surgical History:  Procedure Laterality Date  . MASS EXCISION Right 05/30/2018   Procedure: EXCISION MASS RIGHT SMALL FINGER;  Surgeon: Cindee Salt, MD;  Location: [facility];  Service: Orthopedics;  Laterality: Right;  . TONSILLECTOMY    . VAGINAL HYSTERECTOMY  1992     Current Outpatient Medications:  .  ciclopirox (PENLAC) 8 % solution, Apply topically at bedtime. Apply over nail and surrounding skin. Apply daily over previous coat. After seven (7) days, may remove with alcohol and continue cycle., Disp: 6.6 mL, Rfl: 2 .  ALPRAZolam (XANAX) 0.25 MG tablet, Take 1 tablet (0.25 mg total) by mouth 2 (two) times daily as needed for anxiety., Disp: 10 tablet, Rfl: 0 .  aspirin 81 MG tablet, Take 81 mg by mouth daily.   (Patient not taking: No sig reported), Disp: , Rfl:  .  b complex vitamins capsule, Take 1 capsule by mouth daily., Disp: , Rfl:  .  Cholecalciferol (VITAMIN D) 50 MCG (2000 UT) CAPS, Take 2,000 Units by mouth daily., Disp: , Rfl:  .  cyclobenzaprine (FLEXERIL) 5 MG tablet, Take 1 tablet (5 mg total) by mouth 3 (three) times daily as needed for muscle spasms., Disp: 30 tablet, Rfl: 0 .  feeding supplement (ENSURE ENLIVE / ENSURE PLUS) LIQD, Take 237  mLs by mouth 2 (two) times daily between meals., Disp: 237 mL, Rfl: 12 .  levETIRAcetam (KEPPRA) 500 MG tablet, Take 1 tablet (500 mg total) by mouth 2 (two) times daily., Disp: , Rfl:  .  magnesium oxide (MAG-OX) 400 MG tablet, Take 400 mg by mouth daily., Disp: , Rfl:  .  Multiple Vitamin (MULTIVITAMIN) tablet, Take 1 tablet by mouth daily., Disp: , Rfl:  .  polyethylene glycol (MIRALAX / GLYCOLAX) 17 g packet, Take 17 g by mouth daily., Disp: 14 each, Rfl: 0 .  rOPINIRole (REQUIP) 0.25 MG tablet, Take 0.125-0.25 mg by mouth as directed. Taking 1/2 to 1 tablet  1 to 3 hours before bedtime as needed for restless leg, Disp: , Rfl:  .  sertraline (ZOLOFT) 25 MG tablet, Take 25 mg by mouth daily., Disp: , Rfl:  .  terbinafine (LAMISIL) 250 MG tablet, Take 250 mg by mouth daily., Disp: , Rfl:  .  Vitamin D, Ergocalciferol, 50 MCG (2000 UT) CAPS, Take 1 capsule by mouth daily., Disp: , Rfl:   Allergies  Allergen Reactions  . Penicillin V Swelling  . Penicillins Hives  . Sulfa Antibiotics Rash         Objective:  Physical Exam  General: AAO x3, NAD  Dermatological: The nails to appear  to be hypertrophic, dystrophic with yellow discoloration and slight brown discoloration.  No significant pain to the nail there is no redness or drainage or any signs of infection.  No open lesions.  Vascular: Dorsalis Pedis artery and Posterior Tibial artery pedal pulses are 2/4 bilateral with immedate capillary fill time. There is no pain with calf compression, swelling, warmth, erythema.   Neruologic: Grossly intact via light touch bilateral.   Musculoskeletal: No gross boney pedal deformities bilateral. No pain, crepitus, or limitation noted with foot and ankle range of motion bilateral. Muscular strength 5/5 in all groups tested bilateral.     Assessment:   Onychomycosis     Plan:  -Treatment options discussed including all alternatives, risks, and complications -Etiology of symptoms were  discussed -Presumed and treated with Lamisil and seems to doing better.  I do want her to continue with topical medication to allow the nails to continue to grow well.  I prescribed Penlac.  As a courtesy debrided the nails without any complications or bleeding.  Return in about 3 months (around 04/25/2021) for nail fungus .  Vivi Barrack DPM

## 2021-01-31 DIAGNOSIS — R3915 Urgency of urination: Secondary | ICD-10-CM | POA: Diagnosis not present

## 2021-01-31 DIAGNOSIS — R339 Retention of urine, unspecified: Secondary | ICD-10-CM | POA: Diagnosis not present

## 2021-02-01 DIAGNOSIS — K5901 Slow transit constipation: Secondary | ICD-10-CM | POA: Diagnosis not present

## 2021-02-01 DIAGNOSIS — G40909 Epilepsy, unspecified, not intractable, without status epilepticus: Secondary | ICD-10-CM | POA: Diagnosis not present

## 2021-02-01 DIAGNOSIS — R339 Retention of urine, unspecified: Secondary | ICD-10-CM | POA: Diagnosis not present

## 2021-02-02 DIAGNOSIS — M810 Age-related osteoporosis without current pathological fracture: Secondary | ICD-10-CM | POA: Diagnosis not present

## 2021-02-02 DIAGNOSIS — I10 Essential (primary) hypertension: Secondary | ICD-10-CM | POA: Diagnosis not present

## 2021-02-02 DIAGNOSIS — M170 Bilateral primary osteoarthritis of knee: Secondary | ICD-10-CM | POA: Diagnosis not present

## 2021-02-02 DIAGNOSIS — B351 Tinea unguium: Secondary | ICD-10-CM | POA: Diagnosis not present

## 2021-02-02 DIAGNOSIS — K5909 Other constipation: Secondary | ICD-10-CM | POA: Diagnosis not present

## 2021-02-02 DIAGNOSIS — G9340 Encephalopathy, unspecified: Secondary | ICD-10-CM | POA: Diagnosis not present

## 2021-02-02 DIAGNOSIS — G2581 Restless legs syndrome: Secondary | ICD-10-CM | POA: Diagnosis not present

## 2021-02-02 DIAGNOSIS — D72829 Elevated white blood cell count, unspecified: Secondary | ICD-10-CM | POA: Diagnosis not present

## 2021-02-02 DIAGNOSIS — M6282 Rhabdomyolysis: Secondary | ICD-10-CM | POA: Diagnosis not present

## 2021-02-02 DIAGNOSIS — M19041 Primary osteoarthritis, right hand: Secondary | ICD-10-CM | POA: Diagnosis not present

## 2021-02-02 DIAGNOSIS — M19042 Primary osteoarthritis, left hand: Secondary | ICD-10-CM | POA: Diagnosis not present

## 2021-02-02 DIAGNOSIS — M5430 Sciatica, unspecified side: Secondary | ICD-10-CM | POA: Diagnosis not present

## 2021-02-02 DIAGNOSIS — E46 Unspecified protein-calorie malnutrition: Secondary | ICD-10-CM | POA: Diagnosis not present

## 2021-02-02 DIAGNOSIS — K2970 Gastritis, unspecified, without bleeding: Secondary | ICD-10-CM | POA: Diagnosis not present

## 2021-02-02 DIAGNOSIS — G479 Sleep disorder, unspecified: Secondary | ICD-10-CM | POA: Diagnosis not present

## 2021-02-02 DIAGNOSIS — J3081 Allergic rhinitis due to animal (cat) (dog) hair and dander: Secondary | ICD-10-CM | POA: Diagnosis not present

## 2021-03-01 DIAGNOSIS — R350 Frequency of micturition: Secondary | ICD-10-CM | POA: Diagnosis not present

## 2021-03-01 DIAGNOSIS — R3914 Feeling of incomplete bladder emptying: Secondary | ICD-10-CM | POA: Diagnosis not present

## 2021-03-04 DIAGNOSIS — J3081 Allergic rhinitis due to animal (cat) (dog) hair and dander: Secondary | ICD-10-CM | POA: Diagnosis not present

## 2021-03-04 DIAGNOSIS — I10 Essential (primary) hypertension: Secondary | ICD-10-CM | POA: Diagnosis not present

## 2021-03-04 DIAGNOSIS — M6282 Rhabdomyolysis: Secondary | ICD-10-CM | POA: Diagnosis not present

## 2021-03-04 DIAGNOSIS — K2970 Gastritis, unspecified, without bleeding: Secondary | ICD-10-CM | POA: Diagnosis not present

## 2021-03-04 DIAGNOSIS — M19041 Primary osteoarthritis, right hand: Secondary | ICD-10-CM | POA: Diagnosis not present

## 2021-03-04 DIAGNOSIS — G479 Sleep disorder, unspecified: Secondary | ICD-10-CM | POA: Diagnosis not present

## 2021-03-04 DIAGNOSIS — M19042 Primary osteoarthritis, left hand: Secondary | ICD-10-CM | POA: Diagnosis not present

## 2021-03-04 DIAGNOSIS — M5430 Sciatica, unspecified side: Secondary | ICD-10-CM | POA: Diagnosis not present

## 2021-03-04 DIAGNOSIS — K5909 Other constipation: Secondary | ICD-10-CM | POA: Diagnosis not present

## 2021-03-04 DIAGNOSIS — M170 Bilateral primary osteoarthritis of knee: Secondary | ICD-10-CM | POA: Diagnosis not present

## 2021-03-04 DIAGNOSIS — G2581 Restless legs syndrome: Secondary | ICD-10-CM | POA: Diagnosis not present

## 2021-03-04 DIAGNOSIS — E46 Unspecified protein-calorie malnutrition: Secondary | ICD-10-CM | POA: Diagnosis not present

## 2021-03-04 DIAGNOSIS — D72829 Elevated white blood cell count, unspecified: Secondary | ICD-10-CM | POA: Diagnosis not present

## 2021-03-04 DIAGNOSIS — G9340 Encephalopathy, unspecified: Secondary | ICD-10-CM | POA: Diagnosis not present

## 2021-03-04 DIAGNOSIS — B351 Tinea unguium: Secondary | ICD-10-CM | POA: Diagnosis not present

## 2021-03-04 DIAGNOSIS — M810 Age-related osteoporosis without current pathological fracture: Secondary | ICD-10-CM | POA: Diagnosis not present

## 2021-03-08 DIAGNOSIS — R8271 Bacteriuria: Secondary | ICD-10-CM | POA: Diagnosis not present

## 2021-03-08 DIAGNOSIS — R350 Frequency of micturition: Secondary | ICD-10-CM | POA: Diagnosis not present

## 2021-03-08 DIAGNOSIS — R3914 Feeling of incomplete bladder emptying: Secondary | ICD-10-CM | POA: Diagnosis not present

## 2021-03-08 DIAGNOSIS — N3000 Acute cystitis without hematuria: Secondary | ICD-10-CM | POA: Diagnosis not present

## 2021-03-24 ENCOUNTER — Other Ambulatory Visit: Payer: Self-pay | Admitting: Podiatry

## 2021-03-24 ENCOUNTER — Telehealth: Payer: Self-pay | Admitting: Podiatry

## 2021-03-24 MED ORDER — CICLOPIROX 8 % EX SOLN: Freq: Every day | CUTANEOUS | 2 refills | Status: DC

## 2021-03-24 MED ORDER — KETOCONAZOLE 2 % EX CREA: 1.0000 "application " | TOPICAL_CREAM | Freq: Every day | CUTANEOUS | 0 refills | Status: DC

## 2021-03-24 NOTE — Telephone Encounter (Signed)
Patient called our office today stating that she wanted to be prescribed a cream for her foot fungus/toe fungus  and she would like it to be sent to the [facility] [address]

## 2021-03-25 NOTE — Telephone Encounter (Signed)
I called the patient back and left her a voicemail stating medication was sent over -the topical penlac for the nail fungus and then ketoconazole for the skin

## 2021-03-29 ENCOUNTER — Other Ambulatory Visit: Payer: Self-pay | Admitting: Podiatry

## 2021-04-07 ENCOUNTER — Telehealth: Payer: Self-pay | Admitting: *Deleted

## 2021-04-07 NOTE — Telephone Encounter (Signed)
I did the cover my meds prior authorization follow up for the patient today. Hannah Fisher

## 2021-04-14 ENCOUNTER — Ambulatory Visit: Payer: Medicare Other | Admitting: Podiatry

## 2021-04-14 ENCOUNTER — Other Ambulatory Visit: Payer: Self-pay

## 2021-04-14 DIAGNOSIS — M79675 Pain in left toe(s): Secondary | ICD-10-CM | POA: Diagnosis not present

## 2021-04-14 DIAGNOSIS — M79674 Pain in right toe(s): Secondary | ICD-10-CM

## 2021-04-14 DIAGNOSIS — B351 Tinea unguium: Secondary | ICD-10-CM

## 2021-04-19 NOTE — Progress Notes (Signed)
Subjective: 70 year old female presents the office today for evaluation of nail fungus.  She stopped taking the Lamisil due to concerns of liver.  She was started on ciclopirox.  Denies any swelling or redness or drainage to the toenail sites. Denies any systemic complaints such as fevers, chills, nausea, vomiting. No acute changes since last appointment, and no other complaints at this time.   Objective: AAO x3, NAD DP/PT pulses palpable bilaterally, CRT less than 3 seconds And skin to be hypertrophic and dystrophic with yellow-brown discoloration with subungual debris.  There is no edema, erythema or drainage or signs of infection.  She has slight tenderness nails 1-5 bilaterally.  No open lesions.   No pain with calf compression, swelling, warmth, erythema  Assessment: Onychomycosis, currently on Penlac  Plan: -All treatment options discussed with the patient including all alternatives, risks, complications.  -Sharply debrided nails x10 without any complications or bleeding.  Continue topical antifungal. -Patient encouraged to call the office with any questions, concerns, change in symptoms.   Vivi Barrack DPM

## 2021-04-22 DIAGNOSIS — G252 Other specified forms of tremor: Secondary | ICD-10-CM | POA: Diagnosis not present

## 2021-04-22 DIAGNOSIS — R296 Repeated falls: Secondary | ICD-10-CM | POA: Diagnosis not present

## 2021-04-22 DIAGNOSIS — R29898 Other symptoms and signs involving the musculoskeletal system: Secondary | ICD-10-CM | POA: Diagnosis not present

## 2021-04-25 ENCOUNTER — Ambulatory Visit: Payer: Medicare Other | Admitting: Podiatry

## 2021-04-25 DIAGNOSIS — G9340 Encephalopathy, unspecified: Secondary | ICD-10-CM | POA: Diagnosis not present

## 2021-04-25 DIAGNOSIS — I10 Essential (primary) hypertension: Secondary | ICD-10-CM | POA: Diagnosis not present

## 2021-04-25 DIAGNOSIS — M6282 Rhabdomyolysis: Secondary | ICD-10-CM | POA: Diagnosis not present

## 2021-05-17 ENCOUNTER — Ambulatory Visit: Payer: Medicare Other | Admitting: Podiatry

## 2021-05-19 DIAGNOSIS — R2689 Other abnormalities of gait and mobility: Secondary | ICD-10-CM | POA: Diagnosis not present

## 2021-05-24 DIAGNOSIS — R2689 Other abnormalities of gait and mobility: Secondary | ICD-10-CM | POA: Diagnosis not present

## 2021-05-26 DIAGNOSIS — R2689 Other abnormalities of gait and mobility: Secondary | ICD-10-CM | POA: Diagnosis not present

## 2021-05-30 ENCOUNTER — Other Ambulatory Visit: Payer: Self-pay | Admitting: Family Medicine

## 2021-05-30 DIAGNOSIS — N6310 Unspecified lump in the right breast, unspecified quadrant: Secondary | ICD-10-CM

## 2021-05-31 DIAGNOSIS — R2689 Other abnormalities of gait and mobility: Secondary | ICD-10-CM | POA: Diagnosis not present

## 2021-06-02 DIAGNOSIS — I10 Essential (primary) hypertension: Secondary | ICD-10-CM | POA: Diagnosis not present

## 2021-06-02 DIAGNOSIS — E7800 Pure hypercholesterolemia, unspecified: Secondary | ICD-10-CM | POA: Diagnosis not present

## 2021-06-02 DIAGNOSIS — E559 Vitamin D deficiency, unspecified: Secondary | ICD-10-CM | POA: Diagnosis not present

## 2021-06-02 DIAGNOSIS — E46 Unspecified protein-calorie malnutrition: Secondary | ICD-10-CM | POA: Diagnosis not present

## 2021-06-02 DIAGNOSIS — R7301 Impaired fasting glucose: Secondary | ICD-10-CM | POA: Diagnosis not present

## 2021-06-07 DIAGNOSIS — W19XXXA Unspecified fall, initial encounter: Secondary | ICD-10-CM | POA: Diagnosis not present

## 2021-06-07 DIAGNOSIS — G259 Extrapyramidal and movement disorder, unspecified: Secondary | ICD-10-CM | POA: Diagnosis not present

## 2021-06-07 DIAGNOSIS — R29898 Other symptoms and signs involving the musculoskeletal system: Secondary | ICD-10-CM | POA: Diagnosis not present

## 2021-06-07 DIAGNOSIS — R531 Weakness: Secondary | ICD-10-CM | POA: Diagnosis not present

## 2021-06-07 DIAGNOSIS — G20 Parkinson's disease: Secondary | ICD-10-CM | POA: Diagnosis not present

## 2021-06-07 DIAGNOSIS — G40909 Epilepsy, unspecified, not intractable, without status epilepticus: Secondary | ICD-10-CM | POA: Diagnosis not present

## 2021-06-09 DIAGNOSIS — R2689 Other abnormalities of gait and mobility: Secondary | ICD-10-CM | POA: Diagnosis not present

## 2021-06-13 DIAGNOSIS — Z0000 Encounter for general adult medical examination without abnormal findings: Secondary | ICD-10-CM | POA: Diagnosis not present

## 2021-06-13 DIAGNOSIS — R269 Unspecified abnormalities of gait and mobility: Secondary | ICD-10-CM | POA: Diagnosis not present

## 2021-06-13 DIAGNOSIS — M810 Age-related osteoporosis without current pathological fracture: Secondary | ICD-10-CM | POA: Diagnosis not present

## 2021-06-13 DIAGNOSIS — E46 Unspecified protein-calorie malnutrition: Secondary | ICD-10-CM | POA: Diagnosis not present

## 2021-06-14 DIAGNOSIS — R2689 Other abnormalities of gait and mobility: Secondary | ICD-10-CM | POA: Diagnosis not present

## 2021-06-16 DIAGNOSIS — R2689 Other abnormalities of gait and mobility: Secondary | ICD-10-CM | POA: Diagnosis not present

## 2021-06-21 ENCOUNTER — Encounter: Payer: Self-pay | Admitting: Podiatry

## 2021-06-21 ENCOUNTER — Ambulatory Visit: Payer: Medicare Other | Admitting: Podiatry

## 2021-06-21 ENCOUNTER — Other Ambulatory Visit: Payer: Self-pay

## 2021-06-21 DIAGNOSIS — M79675 Pain in left toe(s): Secondary | ICD-10-CM | POA: Diagnosis not present

## 2021-06-21 DIAGNOSIS — Z79899 Other long term (current) drug therapy: Secondary | ICD-10-CM

## 2021-06-21 DIAGNOSIS — M79674 Pain in right toe(s): Secondary | ICD-10-CM | POA: Diagnosis not present

## 2021-06-21 DIAGNOSIS — B351 Tinea unguium: Secondary | ICD-10-CM

## 2021-06-21 NOTE — Patient Instructions (Signed)
Terbinafine tablets What is this medication? TERBINAFINE (TER bin a feen) is an antifungal medicine. It is used to treatcertain kinds of fungal or yeast infections. This medicine may be used for other purposes; ask your health care provider orpharmacist if you have questions. COMMON BRAND NAME(S): Lamisil, Terbinex What should I tell my care team before I take this medication? They need to know if you have any of these conditions: drink alcoholic beverages kidney disease liver disease an unusual or allergic reaction to terbinafine, other medicines, foods, dyes, or preservatives pregnant or trying to get pregnant breast-feeding How should I use this medication? Take this medicine by mouth with a full glass of water. Follow the directions on the prescription label. You can take this medicine with food or on an empty stomach. Take your medicine at regular intervals. Do not take your medicine more often than directed. Do not skip doses or stop your medicine early even ifyou feel better. Do not stop taking except on your doctor's advice. A special MedGuide will be given to you by the pharmacist with eachprescription and refill. Be sure to read this information carefully each time. Talk to your pediatrician regarding the use of this medicine in children.Special care may be needed. Overdosage: If you think you have taken too much of this medicine contact apoison control center or emergency room at once. NOTE: This medicine is only for you. Do not share this medicine with others. What if I miss a dose? If you miss a dose, take it as soon as you can. If it is almost time for yournext dose, take only that dose. Do not take double or extra doses. What may interact with this medication? Do not take this medicine with any of the following medications: thioridazine This medicine may also interact with the following medications: beta-blockers caffeine cimetidine cyclosporine medicines for depression,  anxiety, or psychotic disturbances medicines for fungal infections like fluconazole and ketoconazole medicines for irregular heartbeat like amiodarone, flecainide and propafenone rifampin warfarin This list may not describe all possible interactions. Give your health care provider a list of all the medicines, herbs, non-prescription drugs, or dietary supplements you use. Also tell them if you smoke, drink alcohol, or use illegaldrugs. Some items may interact with your medicine. What should I watch for while using this medication? Visit your doctor or health care provider regularly. Tell your doctor right away if you have nausea or vomiting, loss of appetite, stomach pain on your right upper side, yellow skin, dark urine, light stools, or are over tired. Some fungal infections need many weeks or months of treatment to cure. If you are taking this medicine for a long time, you will need to have important bloodwork done. This medicine may cause serious skin reactions. They can happen weeks to months after starting the medicine. Contact your health care provider right away if you notice fevers or flu-like symptoms with a rash. The rash may be red or purple and then turn into blisters or peeling of the skin. Or, you might notice a red rash with swelling of the face, lips or lymph nodes in your neck or underyour arms. What side effects may I notice from receiving this medication? Side effects that you should report to your doctor or health care professionalas soon as possible: allergic reactions like skin rash or hives, swelling of the face, lips, or tongue changes in vision dark urine fever or infection general ill feeling or flu-like symptoms light-colored stools loss of appetite, nausea rash, fever,   and swollen lymph nodes redness, blistering, peeling or loosening of the skin, including inside the mouth right upper belly pain unusually weak or tired yellowing of the eyes or skin Side effects that  usually do not require medical attention (report to yourdoctor or health care professional if they continue or are bothersome): changes in taste diarrhea hair loss muscle or joint pain stomach gas stomach upset This list may not describe all possible side effects. Call your doctor for medical advice about side effects. You may report side effects to FDA at1-800-FDA-1088. Where should I keep my medication? Keep out of the reach of children. Store at room temperature below 25 degrees C (77 degrees F). Protect fromlight. Throw away any unused medicine after the expiration date. NOTE: This sheet is a summary. It may not cover all possible information. If you have questions about this medicine, talk to your doctor, pharmacist, orhealth care provider.  2022 Elsevier/Gold Standard (2018-12-27 15:37:07)  

## 2021-06-23 ENCOUNTER — Ambulatory Visit: Payer: Medicare Other | Admitting: Podiatry

## 2021-06-26 NOTE — Progress Notes (Signed)
Subjective: 70 year old female presents the office today for evaluation of nail fungus.  She does have the nails be trimmed dystrophic and she cannot do them herself.  She said that she recently follow-up with her primary care physician she was given the clearance to go to continue with Lamisil and she restarted this about 2 days ago.  So far no side effects.    Objective: AAO x3, NAD DP/PT pulses palpable bilaterally, CRT less than 3 seconds Toenails appear to be hypertrophic and dystrophic with yellow-brown discoloration with subungual debris.  There is no edema, erythema or drainage or signs of infection.  She has slight tenderness nails 1-5 bilaterally.  No open lesions.   No pain with calf compression, swelling, warmth, erythema  Assessment: Onychomycosis  Plan: -All treatment options discussed with the patient including all alternatives, risks, complications.  -Sharply debrided nails x10 without any complications or bleeding.  Continue topical antifungal. -She was given clearance to start Lamisil and she is already restarted this.  I will check blood work in about 4 weeks and gave her prescription to check a CBC and LFT.  Monitoring side effects of Lamisil. -Patient encouraged to call the office with any questions, concerns, change in symptoms.   Vivi Barrack DPM

## 2021-06-30 DIAGNOSIS — R2689 Other abnormalities of gait and mobility: Secondary | ICD-10-CM | POA: Diagnosis not present

## 2021-07-05 DIAGNOSIS — R2689 Other abnormalities of gait and mobility: Secondary | ICD-10-CM | POA: Diagnosis not present

## 2021-07-12 ENCOUNTER — Other Ambulatory Visit: Payer: Medicare Other

## 2021-07-20 DIAGNOSIS — R29898 Other symptoms and signs involving the musculoskeletal system: Secondary | ICD-10-CM | POA: Diagnosis not present

## 2021-07-20 DIAGNOSIS — Z9181 History of falling: Secondary | ICD-10-CM | POA: Diagnosis not present

## 2021-07-20 DIAGNOSIS — R29818 Other symptoms and signs involving the nervous system: Secondary | ICD-10-CM | POA: Diagnosis not present

## 2021-07-20 DIAGNOSIS — R569 Unspecified convulsions: Secondary | ICD-10-CM | POA: Diagnosis not present

## 2021-07-20 DIAGNOSIS — Z87820 Personal history of traumatic brain injury: Secondary | ICD-10-CM | POA: Diagnosis not present

## 2021-07-20 DIAGNOSIS — R9401 Abnormal electroencephalogram [EEG]: Secondary | ICD-10-CM | POA: Diagnosis not present

## 2021-07-28 DIAGNOSIS — R059 Cough, unspecified: Secondary | ICD-10-CM | POA: Diagnosis not present

## 2021-07-28 DIAGNOSIS — J019 Acute sinusitis, unspecified: Secondary | ICD-10-CM | POA: Diagnosis not present

## 2021-08-16 ENCOUNTER — Other Ambulatory Visit: Payer: Medicare Other

## 2021-08-23 ENCOUNTER — Ambulatory Visit: Payer: Medicare Other | Admitting: Podiatry

## 2021-08-23 ENCOUNTER — Other Ambulatory Visit: Payer: Self-pay

## 2021-08-23 ENCOUNTER — Encounter: Payer: Self-pay | Admitting: Podiatry

## 2021-08-23 DIAGNOSIS — M79674 Pain in right toe(s): Secondary | ICD-10-CM | POA: Diagnosis not present

## 2021-08-23 DIAGNOSIS — Z79899 Other long term (current) drug therapy: Secondary | ICD-10-CM | POA: Diagnosis not present

## 2021-08-23 DIAGNOSIS — M79675 Pain in left toe(s): Secondary | ICD-10-CM | POA: Diagnosis not present

## 2021-08-23 DIAGNOSIS — R2689 Other abnormalities of gait and mobility: Secondary | ICD-10-CM | POA: Insufficient documentation

## 2021-08-23 DIAGNOSIS — B351 Tinea unguium: Secondary | ICD-10-CM | POA: Diagnosis not present

## 2021-08-23 NOTE — Patient Instructions (Signed)
Terbinafine Tablets °What is this medication? °TERBINAFINE (TER bin a feen) treats fungal infections of the nails. It belongs to a group of medications called antifungals. It will not treat infections caused by bacteria or viruses. °This medicine may be used for other purposes; ask your health care provider or pharmacist if you have questions. °COMMON BRAND NAME(S): Lamisil, Terbinex °What should I tell my care team before I take this medication? °They need to know if you have any of these conditions: °Liver disease °An unusual or allergic reaction to terbinafine, other medications, foods, dyes, or preservatives °Pregnant or trying to get pregnant °Breast-feeding °How should I use this medication? °Take this medication by mouth with water. Take it as directed on the prescription label at the same time every day. You can take it with or without food. If it upsets your stomach, take it with food. Keep taking it unless your care team tells you to stop. °A special MedGuide will be given to you by the pharmacist with each prescription and refill. Be sure to read this information carefully each time. °Talk to your care team regarding the use of this medication in children. Special care may be needed. °Overdosage: If you think you have taken too much of this medicine contact a poison control center or emergency room at once. °NOTE: This medicine is only for you. Do not share this medicine with others. °What if I miss a dose? °If you miss a dose, take it as soon as you can unless it is more than 4 hours late. If it is more than 4 hours late, skip the missed dose. Take the next dose at the normal time. °What may interact with this medication? °Do not take this medication with any of the following: °Pimozide °Thioridazine °This medication may also interact with the following: °Beta blockers °Caffeine °Certain medications for mental health conditions °Cimetidine °Cyclosporine °Medications for fungal infections like fluconazole  and ketoconazole °Medications for irregular heartbeat like amiodarone, flecainide and propafenone °Rifampin °Warfarin °This list may not describe all possible interactions. Give your health care provider a list of all the medicines, herbs, non-prescription drugs, or dietary supplements you use. Also tell them if you smoke, drink alcohol, or use illegal drugs. Some items may interact with your medicine. °What should I watch for while using this medication? °Visit your care team for regular checks on your progress. You may need blood work while you are taking this medication. It may be some time before you see the benefit from this medication. °This medication may cause serious skin reactions. They can happen weeks to months after starting the medication. Contact your care team right away if you notice fevers or flu-like symptoms with a rash. The rash may be red or purple and then turn into blisters or peeling of the skin. Or, you might notice a red rash with swelling of the face, lips or lymph nodes in your neck or under your arms. °This medication can make you more sensitive to the sun. Keep out of the sun, If you cannot avoid being in the sun, wear protective clothing and sunscreen. Do not use sun lamps or tanning beds/booths. °What side effects may I notice from receiving this medication? °Side effects that you should report to your care team as soon as possible: °Allergic reactions--skin rash, itching, hives, swelling of the face, lips, tongue, or throat °Change in sense of smell °Change in taste °Infection--fever, chills, cough, or sore throat °Liver injury--right upper belly pain, loss of appetite, nausea,   light-colored stool, dark yellow or brown urine, yellowing skin or eyes, unusual weakness or fatigue °Low red blood cell level--unusual weakness or fatigue, dizziness, headache, trouble breathing °Lupus-like syndrome--joint pain, swelling, or stiffness, butterfly-shaped rash on the face, rashes that get worse  in the sun, fever, unusual weakness or fatigue °Rash, fever, and swollen lymph nodes °Redness, blistering, peeling, or loosening of the skin, including inside the mouth °Unusual bruising or bleeding °Worsening mood, feelings of depression °Side effects that usually do not require medical attention (report to your care team if they continue or are bothersome): °Diarrhea °Gas °Headache °Nausea °Stomach pain °Upset stomach °This list may not describe all possible side effects. Call your doctor for medical advice about side effects. You may report side effects to FDA at 1-800-FDA-1088. °Where should I keep my medication? °Keep out of the reach of children and pets. °Store between 20 and 25 degrees C (68 and 77 degrees F). Protect from light. Get rid of any unused medication after the expiration date. °To get rid of medications that are no longer needed or have expired: °Take the medication to a medication take-back program. Check with your pharmacy or law enforcement to find a location. °If you cannot return the medication, check the label or package insert to see if the medication should be thrown out in the garbage or flushed down the toilet. If you are not sure, ask your care team. If it is safe to put it in the trash, take the medication out of the container. Mix the medication with cat litter, dirt, coffee grounds, or other unwanted substance. Seal the mixture in a bag or container. Put it in the trash. °NOTE: This sheet is a summary. It may not cover all possible information. If you have questions about this medicine, talk to your doctor, pharmacist, or health care provider. °© 2022 [employer]vier/Gold Standard (2021-05-04 00:00:00) ° °

## 2021-08-24 LAB — HEPATIC FUNCTION PANEL
ALT: 10 IU/L (ref 0–32)
AST: 15 IU/L (ref 0–40)
Albumin: 4.8 g/dL — ABNORMAL HIGH (ref 3.7–4.7)
Alkaline Phosphatase: 101 IU/L (ref 44–121)
Bilirubin Total: 0.3 mg/dL (ref 0.0–1.2)
Bilirubin, Direct: <0.1 mg/dL (ref 0.00–0.40)
Total Protein: 7 g/dL (ref 6.0–8.5)

## 2021-08-24 LAB — CBC WITH DIFFERENTIAL/PLATELET
Basophils Absolute: 0 10*3/uL (ref 0.0–0.2)
Basos: 0 %
EOS (ABSOLUTE): 0.2 10*3/uL (ref 0.0–0.4)
Eos: 3 %
Hematocrit: 40.9 % (ref 34.0–46.6)
Hemoglobin: 13.7 g/dL (ref 11.1–15.9)
Immature Grans (Abs): 0 10*3/uL (ref 0.0–0.1)
Immature Granulocytes: 0 %
Lymphocytes Absolute: 1.6 10*3/uL (ref 0.7–3.1)
Lymphs: 24 %
MCH: 30.6 pg (ref 26.6–33.0)
MCHC: 33.5 g/dL (ref 31.5–35.7)
MCV: 91 fL (ref 79–97)
Monocytes Absolute: 0.4 10*3/uL (ref 0.1–0.9)
Monocytes: 6 %
Neutrophils Absolute: 4.6 10*3/uL (ref 1.4–7.0)
Neutrophils: 67 %
Platelets: 318 10*3/uL (ref 150–450)
RBC: 4.48 x10E6/uL (ref 3.77–5.28)
RDW: 13 % (ref 11.7–15.4)
WBC: 6.8 10*3/uL (ref 3.4–10.8)

## 2021-08-29 ENCOUNTER — Telehealth: Payer: Self-pay | Admitting: *Deleted

## 2021-08-29 NOTE — Telephone Encounter (Signed)
-----   Message from Vivi Barrack, DPM sent at 08/24/2021  2:33 PM EST ----- The liver function is normal. Can you see how many of the Lamisil tablets you have left?   Misty Stanley- can you please call her to follow up.

## 2021-08-29 NOTE — Telephone Encounter (Signed)
Called and spoke with the patient's daughter and relayed the message and the daughter stated that she would call back to the office and let us know how many more pills the patient has left. Misty Stanley

## 2021-08-30 NOTE — Progress Notes (Signed)
Subjective: 70 year old female presents the office today for evaluation of nail fungus.  She is on Lamisil any side effects.  She has not had the blood work I ordered last appointment.  She does nails are getting better.  No swelling or redness or any drainage.    Objective: AAO x3, NAD DP/PT pulses palpable bilaterally, CRT less than 3 seconds Toenails appear to be hypertrophic and dystrophic with yellow-brown discoloration with subungual debris.  There is clearing of the proximal nail folds.  There is no edema, erythema or drainage or signs of infection.  She has slight tenderness nails 1-5 bilaterally.  No open lesions.   No pain with calf compression, swelling, warmth, erythema  Assessment: Onychomycosis  Plan: -All treatment options discussed with the patient including all alternatives, risks, complications.  -Sharply debrided nails x10 without any complications or bleeding.  Continue topical antifungal. -She did not get the blood work rechecked CBC, LFT.  Continue monitoring side effects of Lamisil.  She still has a lab order form. -Patient encouraged to call the office with any questions, concerns, change in symptoms.   Vivi Barrack DPM

## 2021-09-06 DIAGNOSIS — H903 Sensorineural hearing loss, bilateral: Secondary | ICD-10-CM | POA: Diagnosis not present

## 2021-09-07 DIAGNOSIS — H5712 Ocular pain, left eye: Secondary | ICD-10-CM | POA: Diagnosis not present

## 2021-09-07 DIAGNOSIS — H0102B Squamous blepharitis left eye, upper and lower eyelids: Secondary | ICD-10-CM | POA: Diagnosis not present

## 2021-09-07 DIAGNOSIS — H43393 Other vitreous opacities, bilateral: Secondary | ICD-10-CM | POA: Diagnosis not present

## 2021-09-07 DIAGNOSIS — H2513 Age-related nuclear cataract, bilateral: Secondary | ICD-10-CM | POA: Diagnosis not present

## 2021-09-20 ENCOUNTER — Other Ambulatory Visit: Payer: Medicare Other

## 2021-09-23 ENCOUNTER — Emergency Department (HOSPITAL_COMMUNITY): Payer: Medicare Other

## 2021-09-23 ENCOUNTER — Encounter (HOSPITAL_COMMUNITY): Payer: Self-pay | Admitting: Emergency Medicine

## 2021-09-23 ENCOUNTER — Other Ambulatory Visit: Payer: Self-pay

## 2021-09-23 ENCOUNTER — Emergency Department (HOSPITAL_COMMUNITY)
Admission: EM | Admit: 2021-09-23 | Discharge: 2021-09-24 | Disposition: A | Discharge status: Home / Self Care | Payer: Medicare Other | Attending: Emergency Medicine | Admitting: Emergency Medicine

## 2021-09-23 DIAGNOSIS — G20 Parkinson's disease: Secondary | ICD-10-CM | POA: Diagnosis not present

## 2021-09-23 DIAGNOSIS — Z79899 Other long term (current) drug therapy: Secondary | ICD-10-CM | POA: Diagnosis not present

## 2021-09-23 DIAGNOSIS — K625 Hemorrhage of anus and rectum: Secondary | ICD-10-CM | POA: Insufficient documentation

## 2021-09-23 DIAGNOSIS — I10 Essential (primary) hypertension: Secondary | ICD-10-CM | POA: Insufficient documentation

## 2021-09-23 DIAGNOSIS — R531 Weakness: Secondary | ICD-10-CM | POA: Diagnosis present

## 2021-09-23 DIAGNOSIS — Z7982 Long term (current) use of aspirin: Secondary | ICD-10-CM | POA: Insufficient documentation

## 2021-09-23 LAB — COMPREHENSIVE METABOLIC PANEL
ALT: 14 U/L (ref 0–44)
AST: 20 U/L (ref 15–41)
Albumin: 4.5 g/dL (ref 3.5–5.0)
Alkaline Phosphatase: 58 U/L (ref 38–126)
Anion gap: 11 (ref 5–15)
BUN: 14 mg/dL (ref 8–23)
CO2: 27 mmol/L (ref 22–32)
Calcium: 9.9 mg/dL (ref 8.9–10.3)
Chloride: 103 mmol/L (ref 98–111)
Creatinine, Ser: 0.73 mg/dL (ref 0.44–1.00)
GFR, Estimated: >60 mL/min (ref >60)
Glucose, Bld: 110 mg/dL — ABNORMAL HIGH (ref 70–99)
Potassium: 3.1 mmol/L — ABNORMAL LOW (ref 3.5–5.1)
Sodium: 141 mmol/L (ref 135–145)
Total Bilirubin: 0.9 mg/dL (ref 0.3–1.2)
Total Protein: 7 g/dL (ref 6.5–8.1)

## 2021-09-23 LAB — DIFFERENTIAL
Abs Immature Granulocytes: 0.04 10*3/uL (ref 0.00–0.07)
Basophils Absolute: 0 10*3/uL (ref 0.0–0.1)
Basophils Relative: 0 %
Eosinophils Absolute: 0.3 10*3/uL (ref 0.0–0.5)
Eosinophils Relative: 3 %
Immature Granulocytes: 0 %
Lymphocytes Relative: 20 %
Lymphs Abs: 1.9 10*3/uL (ref 0.7–4.0)
Monocytes Absolute: 0.5 10*3/uL (ref 0.1–1.0)
Monocytes Relative: 5 %
Neutro Abs: 6.6 10*3/uL (ref 1.7–7.7)
Neutrophils Relative %: 72 %

## 2021-09-23 LAB — CBC
HCT: 41.2 % (ref 36.0–46.0)
Hemoglobin: 13.9 g/dL (ref 12.0–15.0)
MCH: 31.4 pg (ref 26.0–34.0)
MCHC: 33.7 g/dL (ref 30.0–36.0)
MCV: 93 fL (ref 80.0–100.0)
Platelets: 327 10*3/uL (ref 150–400)
RBC: 4.43 MIL/uL (ref 3.87–5.11)
RDW: 13.1 % (ref 11.5–15.5)
WBC: 9.4 10*3/uL (ref 4.0–10.5)
nRBC: 0 % (ref 0.0–0.2)

## 2021-09-23 LAB — URINALYSIS, ROUTINE W REFLEX MICROSCOPIC
Bilirubin Urine: NEGATIVE
Glucose, UA: NEGATIVE mg/dL
Hgb urine dipstick: NEGATIVE
Ketones, ur: NEGATIVE mg/dL
Leukocytes,Ua: NEGATIVE
Nitrite: NEGATIVE
Protein, ur: NEGATIVE mg/dL
Specific Gravity, Urine: 1.015 (ref 1.005–1.030)
pH: 7 (ref 5.0–8.0)

## 2021-09-23 LAB — TROPONIN I (HIGH SENSITIVITY)
Troponin I (High Sensitivity): 5 ng/L (ref <18)
Troponin I (High Sensitivity): 4 ng/L (ref <18)

## 2021-09-23 LAB — PROTIME-INR
INR: 0.9 (ref 0.8–1.2)
Prothrombin Time: 12.1 seconds (ref 11.4–15.2)

## 2021-09-23 LAB — APTT: aPTT: 25 seconds (ref 24–36)

## 2021-09-23 MED ORDER — POTASSIUM CHLORIDE CRYS ER 20 MEQ PO TBCR
40.0000 meq | EXTENDED_RELEASE_TABLET | Freq: Once | ORAL | Status: AC
  Administered 2021-09-24: 40 meq via ORAL
  Filled 2021-09-23: qty 2

## 2021-09-23 NOTE — ED Provider Notes (Signed)
[facility] EMERGENCY DEPARTMENT Provider Note   CSN: [id number] Arrival date & time: 09/23/21  1449     History Chief Complaint  Patient presents with   Weakness   Hypertension    Hannah Fisher is a 70 y.o. female with new diagnosis of Parkinson's and history of hypertension who presents to the emergency department for elevated blood pressures, generalized weakness, and frequent falls.  Patient was recently diagnosed with Parkinson's disease last week and was can to be started on dopamine therapy but needed her blood pressures to get controlled.  Blood pressures been running anywhere from 1 60-1 90 systolic.  She typically takes lisinopril for her blood pressure.  Patient states that she has been just generally weak and unsteady on her feet and has had 4 falls in the last 7 days.  Patient does endorse some associated left-sided chest wall pain from a previous fall.  She denies any focal weakness or numbness.  No history of ACS or stroke.  She currently does not take any anticoagulation.   Weakness Hypertension      Past Medical History:  Diagnosis Date   Arthritis    hands, knees   Deafness    left ear sue to shingles   Hypertension    Kidney stones    Osteoporosis 2009   -2.6   Shingles     Patient Active Problem List   Diagnosis Date Noted   Impairment of balance 08/23/2021   Parkinson's disease (HCC) 01/28/2021   Abnormal gait 01/24/2021   Allergic rhinitis 01/24/2021   Allergic rhinitis due to animal (cat) (dog) hair and dander 01/24/2021   Anxiety 01/24/2021   Chronic constipation 01/24/2021   Difficulty sleeping 01/24/2021   Gastritis 01/24/2021   High cholesterol 01/24/2021   Protein calorie malnutrition (HCC) 01/24/2021   Rectal bleeding 01/24/2021   Recurrent falls 01/24/2021   Restless legs 01/24/2021   Sciatica 01/24/2021   Standard chest x-ray abnormal 01/24/2021   Vitamin D deficiency 01/24/2021   Acute encephalopathy  12/28/2020   Rhabdomyolysis 12/28/2020   Lightheadedness 06/22/2020   Other abnormalities of gait and mobility 05/03/2020   Lumbar radiculopathy 04/28/2020   Dysphonia 12/03/2019   Age-related osteoporosis without current pathological fracture 11/25/2019   Dysfunction of both eustachian tubes 05/19/2019   Presbycusis of both ears 05/19/2019   Unilateral deafness, left 04/07/2019   Mass 04/17/2018   Deafness    Shingles    Osteoporosis    Hypertension     Past Surgical History:  Procedure Laterality Date   MASS EXCISION Right 05/30/2018   Procedure: EXCISION MASS RIGHT SMALL FINGER;  Surgeon: Cindee Salt, MD;  Location: [facility];  Service: Orthopedics;  Laterality: Right;   TONSILLECTOMY     VAGINAL HYSTERECTOMY  1992     OB History     Gravida  2   Para  2   Term      Preterm      AB      Living  2      SAB      IAB      Ectopic      Multiple      Live Births              Family History  Problem Relation Age of Onset   Hypertension Mother    Stroke Mother    Hypertension Sister    Hypertension Brother    Breast cancer Neg Hx  Social History   Tobacco Use   Smoking status: Never   Smokeless tobacco: Never  Vaping Use   Vaping Use: Never used  Substance Use Topics   Alcohol use: Yes    Comment: ocaas   Drug use: No    Home Medications Prior to Admission medications   Medication Sig Start Date End Date Taking? Authorizing Provider  acetaminophen (TYLENOL) 650 MG CR tablet Take 650 mg by mouth in the morning and at bedtime.   Yes [provider]  ALPRAZolam (XANAX) 0.25 MG tablet Take 1 tablet (0.25 mg total) by mouth 2 (two) times daily as needed for anxiety. 01/04/21  Yes Erick Blinks, MD  aspirin 81 MG EC tablet Take 81 mg by mouth daily.   Yes [provider]  lisinopril (ZESTRIL) 2.5 MG tablet Take 2.5 mg by mouth daily.   Yes [provider]  naproxen sodium (ALEVE) 220 MG tablet Take  220-440 mg by mouth See admin instructions. 220 mg in the morning 440 mg at bedtime   Yes [provider]  rOPINIRole (REQUIP) 0.25 MG tablet Take 0.125 mg by mouth at bedtime.   Yes [provider]  ciclopirox (PENLAC) 8 % solution Apply topically at bedtime. Apply over nail and surrounding skin. Apply daily over previous coat. After seven (7) days, may remove with alcohol and continue cycle. Patient not taking: Reported on 09/23/2021 03/24/21   Vivi Barrack, DPM  cyclobenzaprine (FLEXERIL) 5 MG tablet Take 1 tablet (5 mg total) by mouth 3 (three) times daily as needed for muscle spasms. Patient not taking: Reported on 09/23/2021 01/04/21   Erick Blinks, MD  feeding supplement (ENSURE ENLIVE / ENSURE PLUS) LIQD Take 237 mLs by mouth 2 (two) times daily between meals. Patient not taking: Reported on 09/23/2021 01/04/21   Erick Blinks, MD  ketoconazole (NIZORAL) 2 % cream APPLY 1 APPLICATION TO THE AFFECTED AREA(S) DAILY Patient not taking: Reported on 09/23/2021 03/30/21   Vivi Barrack, DPM  levETIRAcetam (KEPPRA) 500 MG tablet Take 1 tablet (500 mg total) by mouth 2 (two) times daily. Patient not taking: Reported on 09/23/2021 01/04/21   Erick Blinks, MD  polyethylene glycol (MIRALAX / GLYCOLAX) 17 g packet Take 17 g by mouth daily. Patient not taking: Reported on 09/23/2021 01/05/21   Erick Blinks, MD    Allergies    Penicillin v, Penicillins, and Sulfa antibiotics  Review of Systems   Review of Systems  Neurological:  Positive for weakness.  All other systems reviewed and are negative.  Physical Exam Updated Vital Signs BP (!) 171/92    Pulse 96    Temp 98.6 F (37 C) (Oral)    Resp 16    LMP 10/02/1990    SpO2 98%   Physical Exam Vitals and nursing note reviewed.  Constitutional:      General: She is not in acute distress.    Appearance: Normal appearance.  HENT:     Head: Normocephalic and atraumatic.  Eyes:     General:        Right eye: No  discharge.        Left eye: No discharge.  Cardiovascular:     Comments: Regular rate and rhythm.  S1/S2 are distinct without any evidence of murmur, rubs, or gallops.  Radial pulses are 2+ bilaterally.  Dorsalis pedis pulses are 2+ bilaterally.  No evidence of pedal edema. Pulmonary:     Comments: Clear to auscultation bilaterally.  Normal effort.  No respiratory distress.  No evidence of wheezes, rales, or rhonchi heard throughout. Abdominal:     General: Abdomen is flat. Bowel sounds are normal. There is no distension.     Tenderness: There is no abdominal tenderness. There is no guarding or rebound.  Musculoskeletal:        General: Normal range of motion.     Cervical back: Neck supple.  Skin:    General: Skin is warm and dry.     Findings: No rash.  Neurological:     General: No focal deficit present.     Mental Status: She is alert.     Comments: She does have symmetrical strength to her lower and upper extremities.  Normal sensation to the upper and lower extremities.  Normal heel-to-shin.    Psychiatric:        Mood and Affect: Mood normal.        Behavior: Behavior normal. Behavior is cooperative.    ED Results / Procedures / Treatments   Labs (all labs ordered are listed, but only abnormal results are displayed) Labs Reviewed  COMPREHENSIVE METABOLIC PANEL - Abnormal; Notable for the following components:      Result Value   Potassium 3.1 (*)    Glucose, Bld 110 (*)    All other components within normal limits  PROTIME-INR  APTT  CBC  DIFFERENTIAL  URINALYSIS, ROUTINE W REFLEX MICROSCOPIC  CBG MONITORING, ED  TROPONIN I (HIGH SENSITIVITY)  TROPONIN I (HIGH SENSITIVITY)    EKG None  Radiology DG Chest 2 View  Result Date: 09/23/2021 CLINICAL DATA:  Chest pain EXAM: CHEST - 2 VIEW COMPARISON:  Chest x-ray 12/28/2020 FINDINGS: Heart size is normal. Mediastinum appears stable. Calcified plaques in the aortic arch. Lungs are hyperinflated with prominent chronic  interstitial lung markings. No focal consolidation identified. No pleural effusion or pneumothorax. IMPRESSION: Emphysematous changes with no acute process identified. Electronically Signed   By: Jannifer Hick M.D.   On: 09/23/2021 16:26   CT HEAD WO CONTRAST  Result Date: 09/23/2021 CLINICAL DATA:  Neuro deficit, acute, stroke suspected worsening dizziness / confusion -- but known hx of parkinsons EXAM: CT HEAD WITHOUT CONTRAST TECHNIQUE: Contiguous axial images were obtained from the base of the skull through the vertex without intravenous contrast. COMPARISON:  MR head 12/29/2020 BRAIN: BRAIN Patchy and confluent areas of decreased attenuation are noted throughout the deep and periventricular white matter of the cerebral hemispheres bilaterally, compatible with chronic microvascular ischemic disease. No evidence of large-territorial acute infarction. No parenchymal hemorrhage. No mass lesion. No extra-axial collection. No mass effect or midline shift. No hydrocephalus. Basilar cisterns are patent. Vascular: No hyperdense vessel. Skull: No acute fracture or focal lesion. Sinuses/Orbits: Paranasal sinuses and mastoid air cells are clear. The orbits are unremarkable. Other: None. IMPRESSION: No acute intracranial abnormality. Electronically Signed   By: Tish Frederickson M.D.   On: 09/23/2021 17:11    Procedures Procedures   Medications Ordered in ED Medications  potassium chloride SA (KLOR-CON M) CR tablet 40 mEq (40 mEq Oral Given 09/24/21 0018)    ED Course  I have reviewed the triage vital signs and the nursing notes.  Pertinent labs & imaging results that were available during my care of the patient were reviewed by me and considered in my medical decision making (see chart for details).    MDM Rules/Calculators/A&P                          Harvie Bridge  Gasser is a 70 y.o. female who presents to the emergency department with elevated blood pressures, generalized weakness, and frequent  falls.  Initial work-up was started in triage to include CBC, CMP, coagulation studies, troponin, UA, CT head, and chest x-ray.  Labs were reassuring and otherwise normal.  Chest x-ray was normal.  CT head was normal.  At this time I have a low suspicion for cerebellar stroke.  Patient was able to walk but does have a shuffling gait consistent with her new diagnosis of Parkinson's.  Patient does not feel comfortable at home. Patient is adamant about not going to a facility.  Patient wishes to have home health.  We discussed keeping the patient here until shift change at 7 AM and will discharge and putting a home health in place upon discharge.  Patient does live by herself. Patient was amenable to this plan.  All questions or concerns addressed. The rest of her care was transferred to Dr. Wilkie Aye.     Final Clinical Impression(s) / ED Diagnoses Final diagnoses:  None    Rx / DC Orders ED Discharge Orders          Ordered    Home Health        09/24/21 0047    Face-to-face encounter (required for Medicare/Medicaid patients)       Comments: I Teressa Lower certify that this patient is under my care and that I, or a nurse practitioner or physician's assistant working with me, had a face-to-face encounter that meets the physician face-to-face encounter requirements with this patient on 09/24/2021. The encounter with the patient was in whole, or in part for the following medical condition(s) which is the primary reason for home health care (List medical condition): New diagnosis of Parkinson's disease and shuffling gait.   09/24/21 0047             Teressa Lower, PA-C 09/24/21 2956    Cheryll Cockayne, MD 09/29/21 [phone]

## 2021-09-23 NOTE — ED Triage Notes (Addendum)
Pt here from home w/ daughter for multiple falls over the last 1.5 weeks and high BP readings. Pt takes lisinopril daily, compliant w/ meds. Pt was dx w/ Parkinson's last Friday, reports feeling weak and dizzy, leading to falls. Pt uses a walker at home. Pt c/o R shoulder/upper back pain since falling last week.

## 2021-09-23 NOTE — ED Provider Notes (Signed)
Emergency Medicine Provider Triage Evaluation Note  Hannah Fisher , a 70 y.o. female  was evaluated in triage.  Pt complains of worsening dizziness, confusion, frequent falls, htn over the last 6 days. Patient with recent diagnosis of parkinson's, known balance issues, htn. Has been taking medication as directed but BP elevated to systolic of 180-200 at home despite this. Does endorse some chest pain that is sharp in nature, non-radiating. Did have a fall around 6 days ago and reports some ongoing left shoulder pain occasionally. Denies numbness / tingling. No hx ACS, stroke. Does not take blood thinners.  Review of Systems  Positive: As above Negative: As above  Physical Exam  BP (!) 182/114 (BP Location: Right Arm)    Pulse (!) 108    Temp 98 F (36.7 C) (Oral)    Resp 17    LMP 10/02/1990    SpO2 100%  Gen:   Awake, no distress   Resp:  Normal effort  MSK:   Moves extremities without difficulty  Other:  CN3-12 grossly intact, 5/5 strength bil UE/LE. Some hesitation and jerky movement on standing -- known hx of parkinsons  Medical Decision Making  Medically screening exam initiated at 3:50 PM.  Appropriate orders placed.  MARISSAH VANDEMARK was informed that the remainder of the evaluation will be completed by another provider, this initial triage assessment does not replace that evaluation, and the importance of remaining in the ED until their evaluation is complete.  Concern for HTN, weakness, worsening dizziness / confusion   [place] 09/23/21 1553    Gerhard Munch, MD 09/26/21 2007

## 2021-09-24 MED ORDER — ACETAMINOPHEN 325 MG PO TABS
650.0000 mg | ORAL_TABLET | Freq: Once | ORAL | Status: AC
  Administered 2021-09-24: 03:00:00 650 mg via ORAL
  Filled 2021-09-24: qty 2

## 2021-09-24 NOTE — ED Provider Notes (Signed)
Patient monitored overnight to be discharged in the morning.  She lives alone and has had frequent falls.  Home health orders placed by primary provider.  Will discharge home with daughter.      Shon Baton, MD 09/24/21 [phone]

## 2021-09-24 NOTE — ED Notes (Signed)
Reviewed discharge instructions with patient and daughter. Follow-up care reviewed. Patient and daughter verbalized understanding. Patient A&Ox4, VSS, and ambulatory with steady gait upon discharge.  

## 2021-09-24 NOTE — Discharge Instructions (Addendum)
You were seen today for worsening falls at home.  Orders were put in for home health evaluation.  Given the holiday, it may take several days to follow-up.  In the meantime, make sure you are taking precautions at home to ambulate and transition safely.

## 2021-09-29 ENCOUNTER — Emergency Department (HOSPITAL_COMMUNITY): Payer: Medicare Other

## 2021-09-29 ENCOUNTER — Other Ambulatory Visit: Payer: Self-pay

## 2021-09-29 ENCOUNTER — Encounter (HOSPITAL_COMMUNITY): Payer: Self-pay | Admitting: Emergency Medicine

## 2021-09-29 ENCOUNTER — Emergency Department (HOSPITAL_COMMUNITY)
Admission: EM | Admit: 2021-09-29 | Discharge: 2021-10-03 | Disposition: A | Discharge status: Home / Self Care | Payer: Medicare Other | Attending: Emergency Medicine | Admitting: Emergency Medicine

## 2021-09-29 DIAGNOSIS — Z7982 Long term (current) use of aspirin: Secondary | ICD-10-CM | POA: Insufficient documentation

## 2021-09-29 DIAGNOSIS — W06XXXA Fall from bed, initial encounter: Secondary | ICD-10-CM | POA: Insufficient documentation

## 2021-09-29 DIAGNOSIS — S32040A Wedge compression fracture of fourth lumbar vertebra, initial encounter for closed fracture: Secondary | ICD-10-CM | POA: Diagnosis not present

## 2021-09-29 DIAGNOSIS — Z79899 Other long term (current) drug therapy: Secondary | ICD-10-CM | POA: Diagnosis not present

## 2021-09-29 DIAGNOSIS — S34109A Unspecified injury to unspecified level of lumbar spinal cord, initial encounter: Secondary | ICD-10-CM | POA: Diagnosis present

## 2021-09-29 DIAGNOSIS — G20 Parkinson's disease: Secondary | ICD-10-CM | POA: Diagnosis not present

## 2021-09-29 DIAGNOSIS — K5900 Constipation, unspecified: Secondary | ICD-10-CM | POA: Insufficient documentation

## 2021-09-29 DIAGNOSIS — I10 Essential (primary) hypertension: Secondary | ICD-10-CM | POA: Diagnosis not present

## 2021-09-29 LAB — CBC
HCT: 38.1 % (ref 36.0–46.0)
Hemoglobin: 12.4 g/dL (ref 12.0–15.0)
MCH: 30.4 pg (ref 26.0–34.0)
MCHC: 32.5 g/dL (ref 30.0–36.0)
MCV: 93.4 fL (ref 80.0–100.0)
Platelets: 253 10*3/uL (ref 150–400)
RBC: 4.08 MIL/uL (ref 3.87–5.11)
RDW: 13.3 % (ref 11.5–15.5)
WBC: 6.7 10*3/uL (ref 4.0–10.5)
nRBC: 0 % (ref 0.0–0.2)

## 2021-09-29 LAB — COMPREHENSIVE METABOLIC PANEL
ALT: 23 U/L (ref 0–44)
AST: 43 U/L — ABNORMAL HIGH (ref 15–41)
Albumin: 3.7 g/dL (ref 3.5–5.0)
Alkaline Phosphatase: 57 U/L (ref 38–126)
Anion gap: 11 (ref 5–15)
BUN: 26 mg/dL — ABNORMAL HIGH (ref 8–23)
CO2: 23 mmol/L (ref 22–32)
Calcium: 9.2 mg/dL (ref 8.9–10.3)
Chloride: 105 mmol/L (ref 98–111)
Creatinine, Ser: 0.97 mg/dL (ref 0.44–1.00)
GFR, Estimated: >60 mL/min (ref >60)
Glucose, Bld: 111 mg/dL — ABNORMAL HIGH (ref 70–99)
Potassium: 3.6 mmol/L (ref 3.5–5.1)
Sodium: 139 mmol/L (ref 135–145)
Total Bilirubin: 0.6 mg/dL (ref 0.3–1.2)
Total Protein: 6.3 g/dL — ABNORMAL LOW (ref 6.5–8.1)

## 2021-09-29 LAB — LIPASE, BLOOD: Lipase: 52 U/L — ABNORMAL HIGH (ref 11–51)

## 2021-09-29 LAB — URINALYSIS, ROUTINE W REFLEX MICROSCOPIC
Bilirubin Urine: NEGATIVE
Glucose, UA: NEGATIVE mg/dL
Hgb urine dipstick: NEGATIVE
Ketones, ur: NEGATIVE mg/dL
Leukocytes,Ua: NEGATIVE
Nitrite: NEGATIVE
Protein, ur: NEGATIVE mg/dL
Specific Gravity, Urine: >1.046 — ABNORMAL HIGH (ref 1.005–1.030)
pH: 5 (ref 5.0–8.0)

## 2021-09-29 MED ORDER — AMLODIPINE BESYLATE 5 MG PO TABS
2.5000 mg | ORAL_TABLET | Freq: Every day | ORAL | Status: DC
  Administered 2021-10-01 – 2021-10-03 (×3): 2.5 mg via ORAL
  Filled 2021-09-29 (×5): qty 1

## 2021-09-29 MED ORDER — HYDROCODONE-ACETAMINOPHEN 5-325 MG PO TABS
1.0000 | ORAL_TABLET | Freq: Four times a day (QID) | ORAL | Status: DC | PRN
  Administered 2021-09-29 – 2021-10-03 (×7): 1 via ORAL
  Filled 2021-09-29 (×7): qty 1

## 2021-09-29 MED ORDER — FLEET ENEMA 7-19 GM/118ML RE ENEM
1.0000 | ENEMA | Freq: Once | RECTAL | Status: AC
  Administered 2021-09-29: 18:00:00 1 via RECTAL
  Filled 2021-09-29: qty 1

## 2021-09-29 MED ORDER — ALPRAZOLAM 0.25 MG PO TABS
0.2500 mg | ORAL_TABLET | Freq: Two times a day (BID) | ORAL | Status: DC | PRN
  Administered 2021-09-29 – 2021-10-02 (×3): 0.25 mg via ORAL
  Filled 2021-09-29 (×4): qty 1

## 2021-09-29 MED ORDER — POLYETHYLENE GLYCOL 3350 17 G PO PACK
17.0000 g | PACK | Freq: Every day | ORAL | Status: DC
  Administered 2021-10-02: 17 g via ORAL
  Filled 2021-09-29 (×2): qty 1

## 2021-09-29 MED ORDER — MORPHINE SULFATE (PF) 4 MG/ML IV SOLN
4.0000 mg | Freq: Once | INTRAVENOUS | Status: AC
  Administered 2021-09-29: 16:00:00 4 mg via INTRAVENOUS
  Filled 2021-09-29: qty 1

## 2021-09-29 MED ORDER — LISINOPRIL 2.5 MG PO TABS
2.5000 mg | ORAL_TABLET | Freq: Every day | ORAL | Status: DC
  Administered 2021-09-30 – 2021-10-03 (×4): 2.5 mg via ORAL
  Filled 2021-09-29 (×7): qty 1

## 2021-09-29 MED ORDER — IOHEXOL 300 MG/ML  SOLN
100.0000 mL | Freq: Once | INTRAMUSCULAR | Status: AC | PRN
  Administered 2021-09-29: 17:00:00 100 mL via INTRAVENOUS

## 2021-09-29 MED ORDER — NAPROXEN 250 MG PO TABS
500.0000 mg | ORAL_TABLET | Freq: Two times a day (BID) | ORAL | Status: DC | PRN
  Administered 2021-10-01 – 2021-10-03 (×3): 500 mg via ORAL
  Filled 2021-09-29 (×3): qty 2

## 2021-09-29 MED ORDER — CYCLOBENZAPRINE HCL 10 MG PO TABS
5.0000 mg | ORAL_TABLET | Freq: Three times a day (TID) | ORAL | Status: DC | PRN
  Administered 2021-09-29 – 2021-10-03 (×4): 5 mg via ORAL
  Filled 2021-09-29 (×5): qty 1

## 2021-09-29 MED ORDER — NAPROXEN SODIUM 220 MG PO TABS: 220.0000 mg | ORAL_TABLET | Freq: Two times a day (BID) | ORAL | Status: DC | PRN

## 2021-09-29 MED ORDER — ROPINIROLE HCL 0.25 MG PO TABS
0.1250 mg | ORAL_TABLET | Freq: Every day | ORAL | Status: DC
  Administered 2021-09-29 – 2021-10-02 (×4): 0.125 mg via ORAL
  Filled 2021-09-29 (×6): qty 0.5

## 2021-09-29 NOTE — ED Notes (Signed)
Verbal report given to Emeline Gins RN at this time. Pt moving from room 30 to room 46

## 2021-09-29 NOTE — [place] FL2 (Signed)
[place] MEDICAID FL2 LEVEL OF CARE SCREENING TOOL     IDENTIFICATION  Patient Name: Hannah Fisher Birthdate: March 13, 1951 Sex: female Admission Date (Current Location): 09/29/2021  [facility] and [place] Number:  Producer, television/film/video and Address:  [facility] [place], [address] [place], [place] [address]      Provider Number: [id number]  Attending Physician Name and Address:  Charlynne Pander, MD  Relative Name and Phone Number:  Maud Deed Daughter [phone]    Current Level of Care: Hospital Recommended Level of Care: Skilled Nursing Facility Prior Approval Number:    Date Approved/Denied:   PASRR Number: [id number]  Discharge Plan: SNF    Current Diagnoses: Patient Active Problem List   Diagnosis Date Noted   Impairment of balance 08/23/2021   Parkinson's disease (HCC) 01/28/2021   Abnormal gait 01/24/2021   Allergic rhinitis 01/24/2021   Allergic rhinitis due to animal (cat) (dog) hair and dander 01/24/2021   Anxiety 01/24/2021   Chronic constipation 01/24/2021   Difficulty sleeping 01/24/2021   Gastritis 01/24/2021   High cholesterol 01/24/2021   Protein calorie malnutrition (HCC) 01/24/2021   Rectal bleeding 01/24/2021   Recurrent falls 01/24/2021   Restless legs 01/24/2021   Sciatica 01/24/2021   Standard chest x-ray abnormal 01/24/2021   Vitamin D deficiency 01/24/2021   Acute encephalopathy 12/28/2020   Rhabdomyolysis 12/28/2020   Lightheadedness 06/22/2020   Other abnormalities of gait and mobility 05/03/2020   Lumbar radiculopathy 04/28/2020   Dysphonia 12/03/2019   Age-related osteoporosis without current pathological fracture 11/25/2019   Dysfunction of both eustachian tubes 05/19/2019   Presbycusis of both ears 05/19/2019   Unilateral deafness, left 04/07/2019   Mass 04/17/2018   Deafness    Shingles    Osteoporosis    Hypertension     Orientation RESPIRATION BLADDER Height & Weight     Self, Time,  Situation, Place  Normal Continent Weight:   Height:     BEHAVIORAL SYMPTOMS/MOOD NEUROLOGICAL BOWEL NUTRITION STATUS      Continent Diet (Regular)  AMBULATORY STATUS COMMUNICATION OF NEEDS Skin   Limited Assist Verbally Bruising (Bruising on back from fall)                       Personal Care Assistance Level of Assistance  Bathing, Feeding, Dressing Bathing Assistance: Limited assistance Feeding assistance: Independent Dressing Assistance: Limited assistance     Functional Limitations Info  Sight, Hearing, Speech Sight Info: Adequate Hearing Info: Adequate Speech Info: Adequate    SPECIAL CARE FACTORS FREQUENCY  PT (By licensed PT), OT (By licensed OT)     PT Frequency: 5x weekly OT Frequency: 5x weekly            Contractures Contractures Info: Not present    Additional Factors Info  Code Status, Allergies Code Status Info: Full Allergies Info: Beta Adrenergic Blockers,Penicillin V, Penicillin,Sulfa Antibiotics           Current Medications (09/29/2021):  This is the current hospital active medication list Current Facility-Administered Medications  Medication Dose Route Frequency Provider Last Rate Last Admin   sodium phosphate (FLEET) 7-19 GM/118ML enema 1 enema  1 enema Rectal Once Charlynne Pander, MD       Current Outpatient Medications  Medication Sig Dispense Refill   acetaminophen (TYLENOL) 650 MG CR tablet Take 650 mg by mouth in the morning and at bedtime.     ALPRAZolam (XANAX) 0.25 MG tablet Take 1 tablet (0.25 mg total)  by mouth 2 (two) times daily as needed for anxiety. 10 tablet 0   aspirin 81 MG EC tablet Take 81 mg by mouth daily.     lisinopril (ZESTRIL) 2.5 MG tablet Take 2.5 mg by mouth daily.     naproxen sodium (ALEVE) 220 MG tablet Take 220-440 mg by mouth See admin instructions. 220 mg in the morning 440 mg at bedtime     rOPINIRole (REQUIP) 0.25 MG tablet Take 0.125 mg by mouth at bedtime.     amLODipine (NORVASC) 2.5 MG  tablet Take 2.5 mg by mouth daily.     ciclopirox (PENLAC) 8 % solution Apply topically at bedtime. Apply over nail and surrounding skin. Apply daily over previous coat. After seven (7) days, may remove with alcohol and continue cycle. (Patient not taking: Reported on 09/23/2021) 6.6 mL 2   cyclobenzaprine (FLEXERIL) 5 MG tablet Take 1 tablet (5 mg total) by mouth 3 (three) times daily as needed for muscle spasms. (Patient not taking: Reported on 09/23/2021) 30 tablet 0   feeding supplement (ENSURE ENLIVE / ENSURE PLUS) LIQD Take 237 mLs by mouth 2 (two) times daily between meals. (Patient not taking: Reported on 09/23/2021) 237 mL 12   ketoconazole (NIZORAL) 2 % cream APPLY 1 APPLICATION TO THE AFFECTED AREA(S) DAILY (Patient not taking: Reported on 09/23/2021) 60 g 0   levETIRAcetam (KEPPRA) 500 MG tablet Take 1 tablet (500 mg total) by mouth 2 (two) times daily. (Patient not taking: Reported on 09/23/2021)     polyethylene glycol (MIRALAX / GLYCOLAX) 17 g packet Take 17 g by mouth daily. (Patient not taking: Reported on 09/23/2021) 14 each 0     Discharge Medications: Please see discharge summary for a list of discharge medications.  Relevant Imaging Results:  Relevant Lab Results:   Additional Information SSN: [id number]. Pfizer vaccines on 10/21/20 and 11/11/20  Zharia Conrow, LCSW

## 2021-09-29 NOTE — Evaluation (Signed)
Physical Therapy Evaluation Patient Details Name: Hannah Fisher MRN: [id number] DOB: Jun 07, 1951 Today's Date: 09/29/2021  History of Present Illness  70 y.o. female  presented to ED 09/29/21.  Pt complains of fall and constipation. PMH-new diagnosis of Parkinson's and history of hypertension  Clinical Impression   Pt admitted secondary to problem above with deficits below. PTA patient was living alone, using RW and has had multiple falls (see recent hospital admission notes).  Pt currently requires min assist with mobility and demonstrates some typical Parkinson disease movement patterns which contribute to her decreased safety and independence. She can benefit from further Parkinson's specific therapy techniques to improve her mobilty and safety.  Anticipate patient will benefit from PT to address problems listed below.Will continue to follow acutely to maximize functional mobility independence and safety.          Recommendations for follow up therapy are one component of a multi-disciplinary discharge planning process, led by the attending physician.  Recommendations may be updated based on patient status, additional functional criteria and insurance authorization.  Follow Up Recommendations Skilled nursing-short term rehab (<3 hours/day)    Assistance Recommended at Discharge Frequent or constant Supervision/Assistance  Functional Status Assessment Patient has had a recent decline in their functional status and demonstrates the ability to make significant improvements in function in a reasonable and predictable amount of time.  Equipment Recommendations  None recommended by PT    Recommendations for Other Services OT consult     Precautions / Restrictions Precautions Precautions: Fall Precaution Comments: multiple falls in recent weeks      Mobility  Bed Mobility Overal bed mobility: Needs Assistance Bed Mobility: Rolling;Sidelying to Sit;Sit to Supine Rolling: Min  assist Sidelying to sit: Min assist   Sit to supine: Max assist   General bed mobility comments: pt fearful of falling off ED stretcher; required guiding/facilitation to complete rolling; assist to raise torso off bed; return to bed max assist due to high stretcher and pt only 5'1"    Transfers Overall transfer level: Needs assistance Equipment used: Rolling walker (2 wheels) Transfers: Sit to/from Stand Sit to Stand: Min assist           General transfer comment: coming off ED stretcher (high surface); anticipate she would need more assist from standard height surface    Ambulation/Gait Ambulation/Gait assistance: Min assist Gait Distance (Feet): 12 Feet Assistive device: Rolling walker (2 wheels) Gait Pattern/deviations: Step-through pattern;Decreased stride length;Decreased weight shift to right;Decreased weight shift to left;Shuffle Gait velocity: very slow Gait velocity interpretation: <1.31 ft/sec, indicative of household ambulator   General Gait Details: pt fearful and required assist with maneuvering RW (esp in turning); difficulty initiating movement and required cues to march in place and then walk forward; walking backward and sideways more difficult and with worse balance (posterior lean)  Stairs            Wheelchair Mobility    Modified Rankin (Stroke Patients Only)       Balance Overall balance assessment: Needs assistance Sitting-balance support: Bilateral upper extremity supported;Feet unsupported Sitting balance-Leahy Scale: Poor Sitting balance - Comments: required bil UE support and still with posterior lean/imbalance   Standing balance support: Bilateral upper extremity supported Standing balance-Leahy Scale: Poor                               Pertinent Vitals/Pain Pain Assessment: No/denies pain    Home Living Family/patient expects to be  discharged to:: Private residence Living Arrangements: Alone Available Help at  Discharge: Family;Available PRN/intermittently Type of Home: House Home Access: Stairs to enter   Entrance Stairs-Number of Steps: 1   Home Layout: One level Home Equipment: Agricultural consultant (2 wheels)      Prior Function Prior Level of Function : Independent/Modified Independent;History of Falls (last six months)             Mobility Comments: uses her RW 99% of the time; fell getting out of bed       Hand Dominance   Dominant Hand: Right    Extremity/Trunk Assessment   Upper Extremity Assessment Upper Extremity Assessment: Generalized weakness    Lower Extremity Assessment Lower Extremity Assessment: Generalized weakness    Cervical / Trunk Assessment Cervical / Trunk Assessment: Kyphotic  Communication   Communication: Expressive difficulties  Cognition Arousal/Alertness: Awake/alert Behavior During Therapy: Anxious Overall Cognitive Status: Within Functional Limits for tasks assessed                                 General Comments: able to provide home set up without correction from daughter        General Comments General comments (skin integrity, edema, etc.): Daughter present    Exercises     Assessment/Plan    PT Assessment Patient needs continued PT services  PT Problem List Decreased strength;Decreased balance;Decreased mobility;Decreased knowledge of use of DME;Decreased safety awareness       PT Treatment Interventions DME instruction;Gait training;Stair training;Functional mobility training;Therapeutic activities;Therapeutic exercise;Balance training;Patient/family education    PT Goals (Current goals can be found in the Care Plan section)  Acute Rehab PT Goals Patient Stated Goal: go to rehab and improve her mobility prior to return home PT Goal Formulation: With patient Time For Goal Achievement: 10/13/21 Potential to Achieve Goals: Good    Frequency Min 2X/week   Barriers to discharge Decreased caregiver  support lives alone    Co-evaluation               AM-PAC PT "6 Clicks" Mobility  Outcome Measure Help needed turning from your back to your side while in a flat bed without using bedrails?: A Little Help needed moving from lying on your back to sitting on the side of a flat bed without using bedrails?: A Little Help needed moving to and from a bed to a chair (including a wheelchair)?: A Little Help needed standing up from a chair using your arms (e.g., wheelchair or bedside chair)?: A Little Help needed to walk in hospital room?: A Little Help needed climbing 3-5 steps with a railing? : Total 6 Click Score: 16    End of Session Equipment Utilized During Treatment: Gait belt Activity Tolerance: Patient tolerated treatment well Patient left: in bed;with call bell/phone within reach;with family/visitor present (in ED) Nurse Communication: Mobility status PT Visit Diagnosis: Unsteadiness on feet (R26.81);Muscle weakness (generalized) (M62.81);History of falling (Z91.81)    Time: 4081-4481 PT Time Calculation (min) (ACUTE ONLY): 25 min   Charges:   PT Evaluation $PT Eval Moderate Complexity: 1 Mod PT Treatments $Gait Training: 8-22 mins         Jerolyn Center, PT Acute Rehabilitation Services  Pager [phone] Office [phone]   Zena Amos 09/29/2021, 4:38 PM

## 2021-09-29 NOTE — ED Notes (Signed)
Pt had diarrhea bm at this time. Pt was cleaned and changed at this time

## 2021-09-29 NOTE — Discharge Instructions (Addendum)
Follow up with neurosurgery regarding your compression fracture   Take naprosyn for pain   Take vicodin for severe pain and flexeril for muscle spasms   Take miralax daily for constipation   Return to ER if you have worse back pain, weakness, numbness, abdominal pain, vomiting

## 2021-09-29 NOTE — ED Notes (Signed)
Received verbal report from Cameron C RN at this time 

## 2021-09-29 NOTE — ED Provider Notes (Signed)
[facility] EMERGENCY DEPARTMENT Provider Note   CSN: [id number] Arrival date & time: 09/29/21  1013     History Chief Complaint  Patient presents with   Fall   Constipation    Hannah Fisher is a 70 y.o. female history of hypertension, kidney stones here presenting with fall and back pain and constipation. Patient states that she had multiple falls recently.  She came in a week ago and was noted to be weak and recommended rehab placement.  At that time she elected to go home with home PT and home health.  Patient states that since then, she had another fall 2 days ago.  Patient states that she has low back pain afterwards.  She patient states that she walks with a walker usually and she has hard time walking with her walker now.  She states that her legs just gave out.  She denies any incontinence.  Denies any numbness in her legs.  Patient also is constipated for several days and was concerned that she may have a stool impaction.  Denies any vomiting or fevers   The history is provided by the patient.      Past Medical History:  Diagnosis Date   Arthritis    hands, knees   Deafness    left ear sue to shingles   Hypertension    Kidney stones    Osteoporosis 2009   -2.6   Shingles     Patient Active Problem List   Diagnosis Date Noted   Impairment of balance 08/23/2021   Parkinson's disease (HCC) 01/28/2021   Abnormal gait 01/24/2021   Allergic rhinitis 01/24/2021   Allergic rhinitis due to animal (cat) (dog) hair and dander 01/24/2021   Anxiety 01/24/2021   Chronic constipation 01/24/2021   Difficulty sleeping 01/24/2021   Gastritis 01/24/2021   High cholesterol 01/24/2021   Protein calorie malnutrition (HCC) 01/24/2021   Rectal bleeding 01/24/2021   Recurrent falls 01/24/2021   Restless legs 01/24/2021   Sciatica 01/24/2021   Standard chest x-ray abnormal 01/24/2021   Vitamin D deficiency 01/24/2021   Acute encephalopathy 12/28/2020    Rhabdomyolysis 12/28/2020   Lightheadedness 06/22/2020   Other abnormalities of gait and mobility 05/03/2020   Lumbar radiculopathy 04/28/2020   Dysphonia 12/03/2019   Age-related osteoporosis without current pathological fracture 11/25/2019   Dysfunction of both eustachian tubes 05/19/2019   Presbycusis of both ears 05/19/2019   Unilateral deafness, left 04/07/2019   Mass 04/17/2018   Deafness    Shingles    Osteoporosis    Hypertension     Past Surgical History:  Procedure Laterality Date   MASS EXCISION Right 05/30/2018   Procedure: EXCISION MASS RIGHT SMALL FINGER;  Surgeon: Cindee Salt, MD;  Location: [facility];  Service: Orthopedics;  Laterality: Right;   TONSILLECTOMY     VAGINAL HYSTERECTOMY  1992     OB History     Gravida  2   Para  2   Term      Preterm      AB      Living  2      SAB      IAB      Ectopic      Multiple      Live Births              Family History  Problem Relation Age of Onset   Hypertension Mother    Stroke Mother    Hypertension Sister  Hypertension Brother    Breast cancer Neg Hx     Social History   Tobacco Use   Smoking status: Never   Smokeless tobacco: Never  Vaping Use   Vaping Use: Never used  Substance Use Topics   Alcohol use: Yes    Comment: ocaas   Drug use: No    Home Medications Prior to Admission medications   Medication Sig Start Date End Date Taking? Authorizing Provider  acetaminophen (TYLENOL) 650 MG CR tablet Take 650 mg by mouth in the morning and at bedtime.   Yes [provider]  ALPRAZolam (XANAX) 0.25 MG tablet Take 1 tablet (0.25 mg total) by mouth 2 (two) times daily as needed for anxiety. 01/04/21  Yes Erick Blinks, MD  aspirin 81 MG EC tablet Take 81 mg by mouth daily.   Yes [provider]  lisinopril (ZESTRIL) 2.5 MG tablet Take 2.5 mg by mouth daily.   Yes [provider]  naproxen sodium (ALEVE) 220 MG tablet Take 220-440 mg by  mouth See admin instructions. 220 mg in the morning 440 mg at bedtime   Yes [provider]  rOPINIRole (REQUIP) 0.25 MG tablet Take 0.125 mg by mouth at bedtime.   Yes [provider]  amLODipine (NORVASC) 2.5 MG tablet Take 2.5 mg by mouth daily. 09/28/21   [provider]  ciclopirox (PENLAC) 8 % solution Apply topically at bedtime. Apply over nail and surrounding skin. Apply daily over previous coat. After seven (7) days, may remove with alcohol and continue cycle. Patient not taking: Reported on 09/23/2021 03/24/21   Vivi Barrack, DPM  cyclobenzaprine (FLEXERIL) 5 MG tablet Take 1 tablet (5 mg total) by mouth 3 (three) times daily as needed for muscle spasms. Patient not taking: Reported on 09/23/2021 01/04/21   Erick Blinks, MD  feeding supplement (ENSURE ENLIVE / ENSURE PLUS) LIQD Take 237 mLs by mouth 2 (two) times daily between meals. Patient not taking: Reported on 09/23/2021 01/04/21   Erick Blinks, MD  ketoconazole (NIZORAL) 2 % cream APPLY 1 APPLICATION TO THE AFFECTED AREA(S) DAILY Patient not taking: Reported on 09/23/2021 03/30/21   Vivi Barrack, DPM  levETIRAcetam (KEPPRA) 500 MG tablet Take 1 tablet (500 mg total) by mouth 2 (two) times daily. Patient not taking: Reported on 09/23/2021 01/04/21   Erick Blinks, MD  polyethylene glycol (MIRALAX / GLYCOLAX) 17 g packet Take 17 g by mouth daily. Patient not taking: Reported on 09/23/2021 01/05/21   Erick Blinks, MD    Allergies    Beta adrenergic blockers, Penicillin v, Penicillins, and Sulfa antibiotics  Review of Systems   Review of Systems  Gastrointestinal:  Positive for constipation.  All other systems reviewed and are negative.  Physical Exam Updated Vital Signs BP 102/69 (BP Location: Right Arm)    Pulse 92    Temp 99 F (37.2 C) (Oral)    Resp 20    LMP 10/02/1990    SpO2 100%   Physical Exam Vitals and nursing note reviewed.  Constitutional:      Appearance: Normal  appearance.     Comments: Chronically ill, uncomfortable  HENT:     Head: Normocephalic.     Nose: Nose normal.     Mouth/Throat:     Mouth: Mucous membranes are moist.  Eyes:     Extraocular Movements: Extraocular movements intact.     Pupils: Pupils are equal, round, and reactive to light.  Cardiovascular:     Rate and Rhythm: Normal  rate and regular rhythm.     Pulses: Normal pulses.     Heart sounds: Normal heart sounds.  Pulmonary:     Effort: Pulmonary effort is normal.     Breath sounds: Normal breath sounds.  Abdominal:     General: Abdomen is flat.     Palpations: Abdomen is soft.  Genitourinary:    Comments: Rectal-stool impaction. Musculoskeletal:     Cervical back: Normal range of motion and neck supple.     Comments: Mild lower lumbar tenderness.  Patient has no saddle anesthesia.  Patient's strength is 4 out of 5 bilateral legs.  Patient has normal reflexes bilateral knees  Neurological:     General: No focal deficit present.     Mental Status: She is alert and oriented to person, place, and time.  Psychiatric:        Mood and Affect: Mood normal.        Behavior: Behavior normal.    ED Results / Procedures / Treatments   Labs (all labs ordered are listed, but only abnormal results are displayed) Labs Reviewed  LIPASE, BLOOD - Abnormal; Notable for the following components:      Result Value   Lipase 52 (*)    All other components within normal limits  COMPREHENSIVE METABOLIC PANEL - Abnormal; Notable for the following components:   Glucose, Bld 111 (*)    BUN 26 (*)    Total Protein 6.3 (*)    AST 43 (*)    All other components within normal limits  CBC  URINALYSIS, ROUTINE W REFLEX MICROSCOPIC    EKG None  Radiology DG Thoracic Spine 2 View  Result Date: 09/29/2021 CLINICAL DATA:  Multiple falls, tender over thoracic and lumbar area EXAM: THORACIC SPINE 2 VIEWS; LUMBAR SPINE - COMPLETE 4+ VIEW COMPARISON:  Chest radiograph 09/23/2021, lumbar  spine MRI 05/10/2020 FINDINGS: Thoracic: Upper thoracic vertebral bodies are suboptimally assessed due to overlying structures. The imaged vertebral body heights are preserved. Alignment is normal. There is minimal degenerative endplate change in the thoracic spine. A rounded density projecting over the T10 vertebral body is favored to reflect a blood vessel seen on end. The imaged heart and lungs are unremarkable. Lumbar: There are 5 non-rib-bearing lumbar type vertebral bodies. There is compression deformity of the L4 vertebral body which is new since the prior MRI of 05/10/2020. There is up to approximately 25% loss of vertebral body height anteriorly. There is minimal bony retropulsion. The other vertebral body heights are preserved. There is minimal degenerative change in the lumbar spine. Alignment is normal. There is no spondylolysis. IMPRESSION: 1. Compression deformity of the L4 vertebral body with up to approximately 25% loss of vertebral body height and minimal bony retropulsion, new since 05/10/2020 and possibly acute. Recommend correlation with point tenderness, and consider MRI for further evaluation. 2. No acute findings in the thoracic spine. Electronically Signed   By: Lesia Hausen M.D.   On: 09/29/2021 12:00   DG Lumbar Spine Complete  Result Date: 09/29/2021 CLINICAL DATA:  Multiple falls, tender over thoracic and lumbar area EXAM: THORACIC SPINE 2 VIEWS; LUMBAR SPINE - COMPLETE 4+ VIEW COMPARISON:  Chest radiograph 09/23/2021, lumbar spine MRI 05/10/2020 FINDINGS: Thoracic: Upper thoracic vertebral bodies are suboptimally assessed due to overlying structures. The imaged vertebral body heights are preserved. Alignment is normal. There is minimal degenerative endplate change in the thoracic spine. A rounded density projecting over the T10 vertebral body is favored to reflect a blood vessel seen on  end. The imaged heart and lungs are unremarkable. Lumbar: There are 5 non-rib-bearing lumbar type  vertebral bodies. There is compression deformity of the L4 vertebral body which is new since the prior MRI of 05/10/2020. There is up to approximately 25% loss of vertebral body height anteriorly. There is minimal bony retropulsion. The other vertebral body heights are preserved. There is minimal degenerative change in the lumbar spine. Alignment is normal. There is no spondylolysis. IMPRESSION: 1. Compression deformity of the L4 vertebral body with up to approximately 25% loss of vertebral body height and minimal bony retropulsion, new since 05/10/2020 and possibly acute. Recommend correlation with point tenderness, and consider MRI for further evaluation. 2. No acute findings in the thoracic spine. Electronically Signed   By: Lesia Hausen M.D.   On: 09/29/2021 12:00   CT HEAD WO CONTRAST ( )  Result Date: 09/29/2021 CLINICAL DATA:  Persistent/recurrent dizziness. Cardiac or vascular cause suspected. Fall from bed 2-3 days ago. EXAM: CT HEAD WITHOUT CONTRAST TECHNIQUE: Contiguous axial images were obtained from the base of the skull through the vertex without intravenous contrast. COMPARISON:  CT head 09/23/2021. FINDINGS: Brain: There is no evidence of acute intracranial hemorrhage, mass lesion, brain edema or extra-axial fluid collection. The ventricles and subarachnoid spaces are appropriately sized for age. There are stable chronic small vessel ischemic changes in the periventricular white matter and a stable prominent perivascular space in the right basal ganglia. No evidence of acute cortical infarct. Vascular: Intracranial vascular calcifications. No hyperdense vessel identified. Skull: Negative for fracture or focal lesion. Sinuses/Orbits: Mild ethmoid sinus mucosal thickening. The additional visualized paranasal sinuses, mastoid air cells and middle ears are clear. No orbital abnormalities. Other: None. IMPRESSION: No acute intracranial findings. Stable mild chronic small vessel ischemic changes in  the periventricular white matter. Mild ethmoid sinus mucosal thickening. Electronically Signed   By: Carey Bullocks M.D.   On: 09/29/2021 17:08   CT ABDOMEN PELVIS W CONTRAST  Result Date: 09/29/2021 CLINICAL DATA:  Suspected bowel obstruction. Acute abdominal pain and constipation. EXAM: CT ABDOMEN AND PELVIS WITH CONTRAST TECHNIQUE: Multidetector CT imaging of the abdomen and pelvis was performed using the standard protocol following bolus administration of intravenous contrast. CONTRAST:  100 mL Omnipaque 300 COMPARISON:  07/27/2010 FINDINGS: Lower chest: Mild dependent atelectasis in the lung bases. Hepatobiliary: No focal liver abnormality is seen. No gallstones, gallbladder wall thickening, or biliary dilatation. Pancreas: Unremarkable. No pancreatic ductal dilatation or surrounding inflammatory changes. Spleen: Normal in size without focal abnormality. Adrenals/Urinary Tract: Adrenal glands are unremarkable. Kidneys are normal, without renal calculi, focal lesion, or hydronephrosis. Bladder is unremarkable. Stomach/Bowel: Stomach, small bowel, and colon are not abnormally distended. No wall thickening or inflammatory changes are demonstrated. Scattered stool in the colon. Appendix is not identified. Vascular/Lymphatic: Aortic atherosclerosis. No enlarged abdominal or pelvic lymph nodes. Reproductive: Status post hysterectomy. No adnexal masses. Other: No abdominal wall hernia or abnormality. No abdominopelvic ascites. Musculoskeletal: Compression of the superior endplate of L4 with about 25% loss of vertebral height. This is new since prior study and could represent acute fracture. IMPRESSION: 1. No evidence of bowel obstruction or inflammation. 2. Aortic atherosclerosis. 3. Compression of the L4 vertebra, indeterminate age, possibly acute. Electronically Signed   By: Burman Nieves M.D.   On: 09/29/2021 17:09   CT L-SPINE NO CHARGE  Result Date: 09/29/2021 CLINICAL DATA:  Fall from bed 2-3 days  ago. Low back pain. Constipation. EXAM: CT LUMBAR SPINE WITHOUT CONTRAST TECHNIQUE: Multiplanar CT image reconstructions of the  lumbar spine were generated from data acquired for abdominopelvic CT at the same time. COMPARISON:  Radiographs 09/29/2021. Lumbar MRI 05/10/2020. Abdominopelvic CT of current date dictated separately. FINDINGS: Segmentation: There are 5 lumbar type vertebral bodies. Alignment: Normal. Vertebrae: As shown on earlier studies, there is a superior endplate compression deformity at L4 which is new compared with previous lumbar MRI. This results in approximately 30% loss of vertebral body height and 4 mm of osseous retropulsion. The margins of this fracture are somewhat sclerotic, and this could reflect a subacute fracture. The posterior elements are intact. No other fractures are identified. Paraspinal and other soft tissues: No paraspinal inflammatory changes or fluid collections. Abdominopelvic CT findings dictated separately. Disc levels: Very mild multilevel disc bulging and facet hypertrophy. No significant disc herniation, spinal stenosis or nerve root encroachment. IMPRESSION: 1. Superior endplate compression deformity at L4, new from 05/10/2020, possibly acute or subacute in age. 2. No other acute osseous findings or malalignment. 3. Stable mild spondylosis without significant disc herniation or spinal stenosis. Electronically Signed   By: Carey Bullocks M.D.   On: 09/29/2021 17:19    Procedures Fecal disimpaction  Date/Time: 09/29/2021 5:55 PM Performed by: Charlynne Pander, MD Authorized by: Charlynne Pander, MD  Consent: Verbal consent obtained. Risks and benefits: risks, benefits and alternatives were discussed Consent given by: patient Patient understanding: patient states understanding of the procedure being performed Patient consent: the patient's understanding of the procedure matches consent given Patient identity confirmed: verbally with patient Local  anesthesia used: no  Anesthesia: Local anesthesia used: no  Sedation: Patient sedated: no  Patient tolerance: patient tolerated the procedure well with no immediate complications       Medications Ordered in ED Medications  sodium phosphate (FLEET) 7-19 GM/118ML enema 1 enema (has no administration in time range)  morphine 4 MG/ML injection 4 mg (4 mg Intravenous Given 09/29/21 1552)  iohexol (OMNIPAQUE) 300 MG/ML solution 100 mL (100 mLs Intravenous Contrast Given 09/29/21 1646)    ED Course  I have reviewed the triage vital signs and the nursing notes.  Pertinent labs & imaging results that were available during my care of the patient were reviewed by me and considered in my medical decision making (see chart for details).    MDM Rules/Calculators/A&P                         Hannah Fisher is a 70 y.o. female here presenting with back pain after fall.  Patient also has worsening weakness.  At baseline she walks with a walker and now she is unable to walk with her walker.  We will get x-rays to rule out fracture.  Patient also has a stool impaction.  I was able to disimpact patient and will give enema.  We will get CT abdomen pelvis as well. Will get physical therapy eval and social work regarding placement  5:55 PM Labs showed minimally elevated lipase of 52.  CT showed no bowel obstruction.  Patient does have a L4 compression fracture.  Physical therapy saw patient and recommended rehab.  Case management is involved.  Her pain is under control after pain medicine.  At this point, I do not think she would qualify for admission.  Started patient on pain medicine and muscle relaxant.       Final Clinical Impression(s) / ED Diagnoses Final diagnoses:  None    Rx / DC Orders ED Discharge Orders     None  Charlynne Pander, MD 09/29/21 [phone]

## 2021-09-29 NOTE — Progress Notes (Signed)
CSW met with Pt and daughter, Vicente Males, at bedside to complete TOC initial assessment.  Per PT eval Pt recommended for SNF. FL2 completed and referrals sent to area SNFs.  Insurance auth started via [employer].

## 2021-09-29 NOTE — Care Management (Signed)
TOC team will meet with patient to discuss PT recommendations.

## 2021-09-29 NOTE — ED Provider Notes (Signed)
Emergency Medicine Provider Triage Evaluation Note  Hannah Fisher , a 70 y.o. female  was evaluated in triage.  Pt complains of fall and constipation.  Patient states that she was recently seen here in the hospital after fall, but she had another 1.  She states 2 mornings ago she was getting out of bed and fell, she believes that she hit her middle back on the bedpost before she hit the ground.  There is no head trauma or loss of consciousness.  She also feels she has a "compaction". Last BM yesterday morning, but was less than normal for her.  Review of Systems  Positive: Fall, back pain, constipation, abdominal pain Negative: Fever, chills, nausea, vomiting, numbness  Physical Exam  BP 103/76 (BP Location: Right Arm)    Pulse (!) 102    Temp 99 F (37.2 C) (Oral)    Resp 14    LMP 10/02/1990    SpO2 97%  Gen:   Awake, no distress   Resp:  Normal effort  MSK:   Moves extremities without difficulty  Other:    Medical Decision Making  Medically screening exam initiated at 10:58 AM.  Appropriate orders placed.  ARMIYAH CAPRON was informed that the remainder of the evaluation will be completed by another provider, this initial triage assessment does not replace that evaluation, and the importance of remaining in the ED until their evaluation is complete.     Jeanella Flattery 09/29/21 1058    Terald Sleeper, MD 09/29/21 [phone]

## 2021-09-29 NOTE — ED Triage Notes (Signed)
Pt states she fell 2-3 days ago while getting out of bed.  States she normally has problems getting in and out of bed and did a twirl and was unable to catch herself.  Reports lower back pain.  Also reports constipation x 2 days and thinks she is impacted.

## 2021-09-30 NOTE — Progress Notes (Signed)
Pt has been accepted to [facility] for SNF. However, due to upcoming change in insurance on 10/02/2021, Pt cannot d/c until new [facility] Harvey is completed with new [facility] on 1/1.   If Berkley Harvey is obtained on Sunday Pt can d/c on 1/1. [facility] accepts weekend admissions.

## 2021-09-30 NOTE — Progress Notes (Signed)
CSW spoke with Marchelle Folks with admissions at [facility] and she stated that nursing is still reviewing. CSW asked if they take [facility] and she stated yes. Patient told CSW that she will have [facility] insurance for next year 2023.    CSW also updated the daughter Maud Deed

## 2021-09-30 NOTE — Progress Notes (Signed)
[facility] and [facility] have no beds today.

## 2021-09-30 NOTE — ED Notes (Signed)
Patients linens changed, patient repositioned with legs elevated. Pt given fluids to drink

## 2021-09-30 NOTE — Progress Notes (Signed)
Bed offers discussed with patient. Patient stated she wants to know what [facility] and [facility] say before she makes a decision. Patient asked to discuss offers with her daughter.

## 2021-09-30 NOTE — ED Notes (Signed)
Full linen and brief change completed at this time. Purewick placed on patient

## 2021-09-30 NOTE — Progress Notes (Signed)
CSW reached out to [facility] to discuss SNF offer

## 2021-09-30 NOTE — ED Notes (Signed)
Patient is resting comfortably. 

## 2021-09-30 NOTE — Progress Notes (Signed)
CSW is awaiting a response from [facility] and [facility] on placement. Both admissions are currently reviewing

## 2021-10-01 MED ORDER — ACETAMINOPHEN 500 MG PO TABS
500.0000 mg | ORAL_TABLET | Freq: Four times a day (QID) | ORAL | Status: DC | PRN
  Administered 2021-10-01 – 2021-10-03 (×5): 500 mg via ORAL
  Filled 2021-10-01 (×5): qty 1

## 2021-10-01 NOTE — Progress Notes (Signed)
Physical Therapy Treatment Patient Details Name: Hannah Fisher MRN: [id number] DOB: [birth year] Today's Date: 10/01/2021   History of Present Illness 70 y.o. female  presented to ED 09/29/21.  Pt complains of fall and constipation. PMH-new diagnosis of Parkinson's and history of hypertension    PT Comments    Pt was seen for mobility on side of bed as she is declining to get up to stand or move the purwick.  Pt did review stretches and strengthening on BLE's, and was able to reposition her to lengthen the hamstrings more than the previous set up did.  Pt was concerned about heels being NWB and was able to make that posture possible with pillow. Follow for acute PT goals, and anticipate with her ability to move should make good progress in SNF toward going home.  Pt is anxious but will respond well with consistent help and patience.  Follow up with SNF admission as a bed is available.   Recommendations for follow up therapy are one component of a multi-disciplinary discharge planning process, led by the attending physician.  Recommendations may be updated based on patient status, additional functional criteria and insurance authorization.  Follow Up Recommendations  Skilled nursing-short term rehab (<3 hours/day)     Assistance Recommended at Discharge Frequent or constant Supervision/Assistance  Equipment Recommendations  None recommended by PT    Recommendations for Other Services       Precautions / Restrictions Precautions Precautions: Fall Precaution Comments: falling a great deal at home Restrictions Weight Bearing Restrictions: No     Mobility  Bed Mobility Overal bed mobility: Needs Assistance Bed Mobility: Rolling;Sidelying to Sit;Sit to Sidelying Rolling: Min guard Sidelying to sit: Min assist     Sit to sidelying: Mod assist General bed mobility comments: pt is perseverating on her family having to help he rmove and reluctant to do herself     Transfers Overall transfer level: Needs assistance                 General transfer comment: pt declined due to not wanting to remove purwick adn replace it    Ambulation/Gait                   Stairs             Wheelchair Mobility    Modified Rankin (Stroke Patients Only)       Balance Overall balance assessment: Needs assistance Sitting-balance support: Bilateral upper extremity supported Sitting balance-Leahy Scale: Fair Sitting balance - Comments: sits but reports she cannot control sitting balance Postural control: Posterior lean                                  Cognition Arousal/Alertness: Awake/alert Behavior During Therapy: Agitated;Anxious Overall Cognitive Status: Within Functional Limits for tasks assessed                                 General Comments: has plan to go home        Exercises General Exercises - Lower Extremity Ankle Circles/Pumps: AROM;AAROM;5 reps Long Arc Quad: AAROM;AROM;10 reps Heel Slides: AROM;AAROM;10 reps Hip Flexion/Marching: AROM;AAROM;10 reps    General Comments General comments (skin integrity, edema, etc.): pt is without family and reluctant to move far, just up to side of bed with improved balance since her last PT visit  Pertinent Vitals/Pain Pain Assessment: No/denies pain    Home Living                          Prior Function            PT Goals (current goals can now be found in the care plan section) Acute Rehab PT Goals Patient Stated Goal: go to rehab and improve her mobility prior to return home    Frequency    Min 2X/week      PT Plan Current plan remains appropriate    Co-evaluation              AM-PAC PT "6 Clicks" Mobility   Outcome Measure  Help needed turning from your back to your side while in a flat bed without using bedrails?: A Little Help needed moving from lying on your back to sitting on the side of a  flat bed without using bedrails?: A Little Help needed moving to and from a bed to a chair (including a wheelchair)?: A Little Help needed standing up from a chair using your arms (e.g., wheelchair or bedside chair)?: A Little Help needed to walk in hospital room?: A Little Help needed climbing 3-5 steps with a railing? : Total 6 Click Score: 16    End of Session Equipment Utilized During Treatment: Gait belt Activity Tolerance: Patient tolerated treatment well Patient left: in bed;with call bell/phone within reach;with family/visitor present Nurse Communication: Mobility status PT Visit Diagnosis: Unsteadiness on feet (R26.81);Muscle weakness (generalized) (M62.81);History of falling (Z91.81)     Time: 4970-2637 PT Time Calculation (min) (ACUTE ONLY): 29 min  Charges:  $Therapeutic Exercise: 8-22 mins $Therapeutic Activity: 8-22 mins                Ivar Drape 10/01/2021, 12:45 PM  Samul Dada, PT PhD Acute Rehab Dept. Number: [facility] [id number] and [facility] [phone]

## 2021-10-03 NOTE — Progress Notes (Signed)
CSW contacted Hannah Beat [phone] again to find out about discharge. No one answered the phone.

## 2021-10-03 NOTE — Progress Notes (Signed)
Auth approved. [id number]  CSW left a message for administration at [facility].

## 2021-10-03 NOTE — Progress Notes (Signed)
CSW contacted the main number at [facility] and was told Admissions is off today. CSW left a message with nursing to see if they can facilitate discharge.

## 2021-10-03 NOTE — ED Notes (Signed)
Breakfast Orders Placed °

## 2021-10-03 NOTE — ED Notes (Signed)
Pt discharged to [facility]. Pt is stable upon d/c. Pt being transported via [facility]. Brief changed and peri care provided before departure.

## 2021-10-03 NOTE — ED Provider Notes (Signed)
°  Physical Exam  BP (!) 130/95    Pulse 88    Temp 98 F (36.7 C) (Oral)    Resp 18    Ht 5\' 1"  (1.549 m)    Wt 54.4 kg    LMP 10/02/1990    SpO2 100%    BMI 22.67 kg/m   Physical Exam  ED Course/Procedures     Procedures  MDM  Patient has been here for 96 hours.  Pending skilled nursing placement       Davonna Belling, MD 10/03/21 1021

## 2021-10-03 NOTE — ED Notes (Signed)
Pt called out stating that her purwick has stopped working. This RN into the room. Brief clean and dry. Pt made aware. Purwick changed. Pt updated on the plan of care. Pt verbalizes understanding.

## 2021-10-03 NOTE — Progress Notes (Signed)
CSW received a call back from [place] with nursing at [facility] [phone]. Judson Roch stated she would call CSW back when she finds out the room situation.

## 2021-10-03 NOTE — Progress Notes (Signed)
CSW reecieved call from [facility] admissions.  Pt is ready to be d/c'ed to [facility]. Room #316A # for report [phone]

## 2021-10-18 ENCOUNTER — Other Ambulatory Visit: Payer: Medicare Other

## 2021-10-25 ENCOUNTER — Other Ambulatory Visit: Payer: Self-pay

## 2021-10-25 ENCOUNTER — Ambulatory Visit: Payer: Medicare PPO | Admitting: Podiatry

## 2021-10-25 DIAGNOSIS — B351 Tinea unguium: Secondary | ICD-10-CM | POA: Diagnosis not present

## 2021-10-25 DIAGNOSIS — M79674 Pain in right toe(s): Secondary | ICD-10-CM

## 2021-10-25 DIAGNOSIS — M79675 Pain in left toe(s): Secondary | ICD-10-CM | POA: Diagnosis not present

## 2021-10-31 NOTE — Progress Notes (Signed)
Subjective: 71 year old female presents the office today for thick, elongated toes that she cannot trim her self.  No swelling or redness to the toenail sites.  She did not take the Lamisil.  No open sores.  She has no other concerns.    Objective: AAO x3, NAD DP/PT pulses palpable bilaterally, CRT less than 3 seconds Toenails appear to be hypertrophic and dystrophic with yellow-brown discoloration with subungual debris.  Tenderness nails 1-5 bilaterally.  No edema, erythema or signs of infection of the toenail sites.  There is no open lesions.  Symptomatic onychomycosis No pain with calf compression, swelling, warmth, erythema  Assessment: Onychomycosis  Plan: -All treatment options discussed with the patient including all alternatives, risks, complications.  -Sharply debrided nails x10 without any complications or bleeding.  Continue topical antifungal.  At this point recommend holding off on oral medication.  Recent blood work from the hospital showed increased AST. -Patient encouraged to call the office with any questions, concerns, change in symptoms.   Vivi Barrack DPM

## 2021-11-04 ENCOUNTER — Other Ambulatory Visit: Payer: Self-pay | Admitting: Family Medicine

## 2021-11-04 DIAGNOSIS — M810 Age-related osteoporosis without current pathological fracture: Secondary | ICD-10-CM

## 2021-11-08 ENCOUNTER — Other Ambulatory Visit: Payer: Medicare PPO

## 2021-11-15 ENCOUNTER — Other Ambulatory Visit: Payer: Medicare PPO

## 2021-12-13 ENCOUNTER — Ambulatory Visit
Admission: RE | Admit: 2021-12-13 | Discharge: 2021-12-13 | Disposition: A | Discharge status: Home / Self Care | Payer: Medicare PPO | Source: Ambulatory Visit | Attending: Family Medicine | Admitting: Family Medicine

## 2021-12-13 ENCOUNTER — Other Ambulatory Visit: Payer: Self-pay | Admitting: Family Medicine

## 2021-12-13 ENCOUNTER — Other Ambulatory Visit: Payer: Self-pay

## 2021-12-13 DIAGNOSIS — N6320 Unspecified lump in the left breast, unspecified quadrant: Secondary | ICD-10-CM

## 2021-12-13 DIAGNOSIS — N6310 Unspecified lump in the right breast, unspecified quadrant: Secondary | ICD-10-CM

## 2021-12-27 ENCOUNTER — Ambulatory Visit: Payer: Medicare PPO | Admitting: Podiatry

## 2021-12-27 ENCOUNTER — Other Ambulatory Visit: Payer: Self-pay

## 2021-12-27 DIAGNOSIS — M79675 Pain in left toe(s): Secondary | ICD-10-CM | POA: Diagnosis not present

## 2021-12-27 DIAGNOSIS — B351 Tinea unguium: Secondary | ICD-10-CM

## 2021-12-27 DIAGNOSIS — M79674 Pain in right toe(s): Secondary | ICD-10-CM

## 2022-01-01 NOTE — Progress Notes (Signed)
Subjective: ?71 year old female presents the office today for thick, elongated toes that she cannot trim her self.  No swelling or redness to the toenail sites. She has no other concerns.   ? ?Objective: ?AAO x3, NAD ?DP/PT pulses palpable bilaterally, CRT less than 3 seconds ?Toenails appear to be hypertrophic and dystrophic with yellow-brown discoloration with subungual debris.  Tenderness nails 1-5 bilaterally.  No edema, erythema or signs of infection of the toenail sites.  There is no open lesions.  Symptomatic onychomycosis ?No pain with calf compression, swelling, warmth, erythema ? ?Assessment: ?Onychomycosis ? ?Plan: ?-All treatment options discussed with the patient including all alternatives, risks, complications.  ?-Sharply debrided nails x10 without any complications or bleeding.  Continue topical antifungal.  We will hold off on any oral medication. ?-Patient encouraged to call the office with any questions, concerns, change in symptoms.  ? ?Vivi Barrack DPM ? ?

## 2022-01-04 DIAGNOSIS — E611 Iron deficiency: Secondary | ICD-10-CM | POA: Diagnosis not present

## 2022-01-04 DIAGNOSIS — Z79899 Other long term (current) drug therapy: Secondary | ICD-10-CM | POA: Diagnosis not present

## 2022-01-04 DIAGNOSIS — G2581 Restless legs syndrome: Secondary | ICD-10-CM | POA: Diagnosis not present

## 2022-01-04 DIAGNOSIS — G20 Parkinson's disease: Secondary | ICD-10-CM | POA: Diagnosis not present

## 2022-01-04 DIAGNOSIS — K5900 Constipation, unspecified: Secondary | ICD-10-CM | POA: Diagnosis not present

## 2022-03-02 ENCOUNTER — Ambulatory Visit: Payer: Medicare PPO | Admitting: Podiatry

## 2022-03-17 ENCOUNTER — Ambulatory Visit: Payer: Medicare PPO | Admitting: Podiatry

## 2022-03-26 IMAGING — MG DIGITAL DIAGNOSTIC BILAT W/ TOMO W/ CAD
8 of 14 series · 8 of 40 positions shown · non-contrast
Comparison: Previous exam(s).

CLINICAL DATA: Delayed follow-up for a likely benign right breast
mass. The patient has lost at least 30 pounds from her prior exam.



[L XCCL synth-2D (1 of 2)]
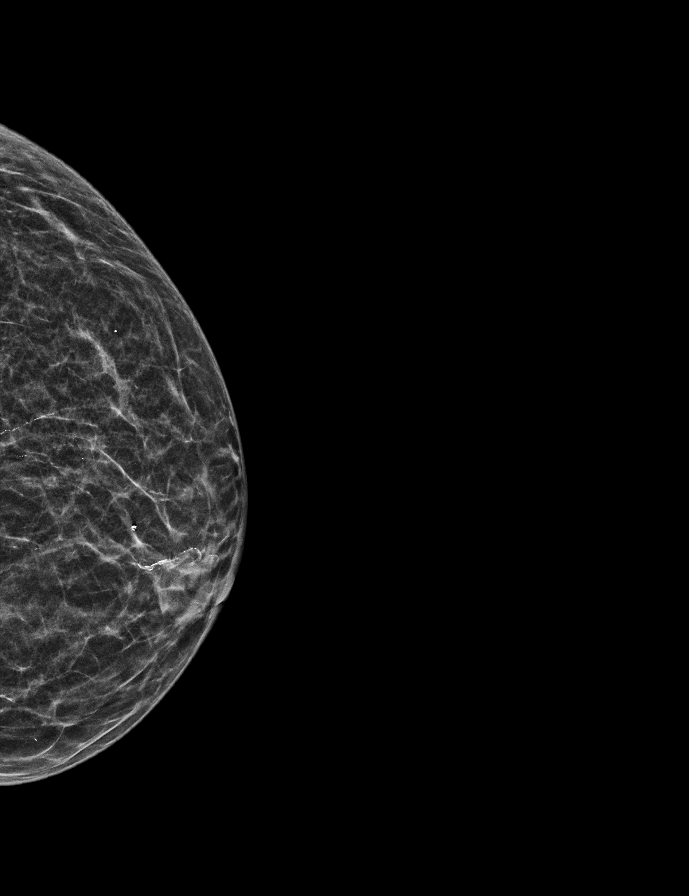

[L CC synth-2D]
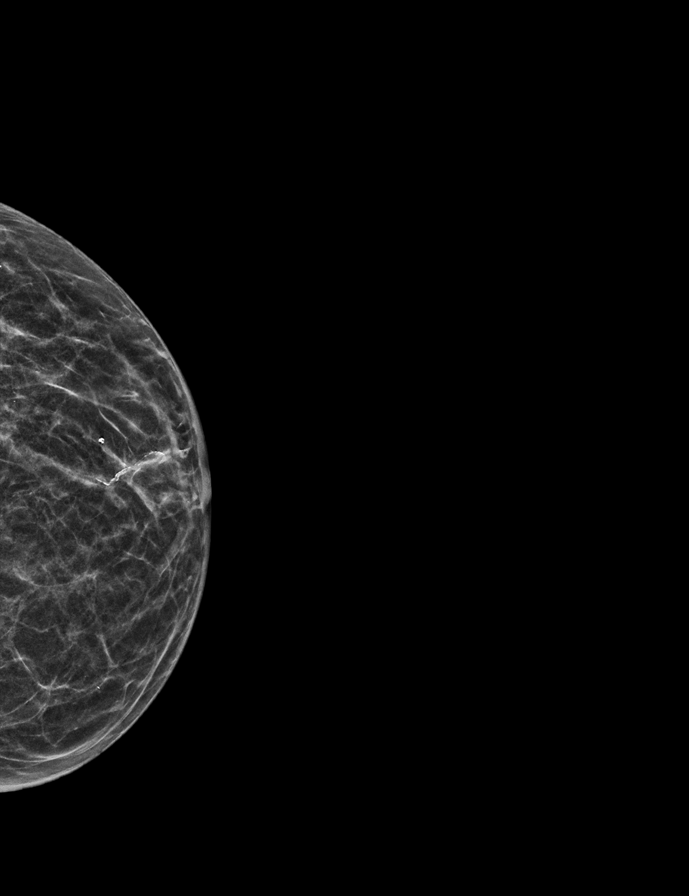

[L MLO synth-2D (1 of 2)]
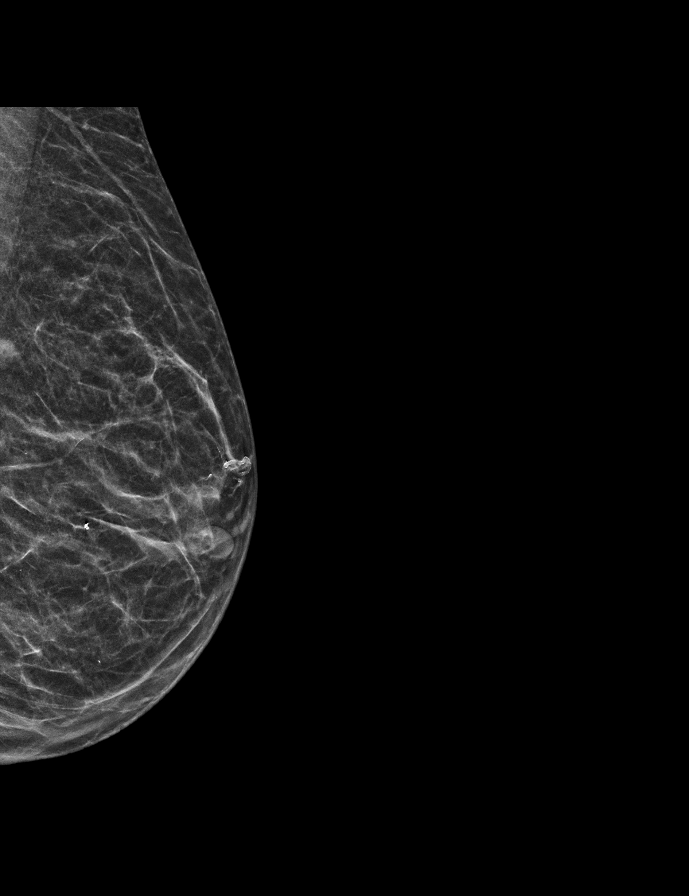

[R MLO synth-2D]
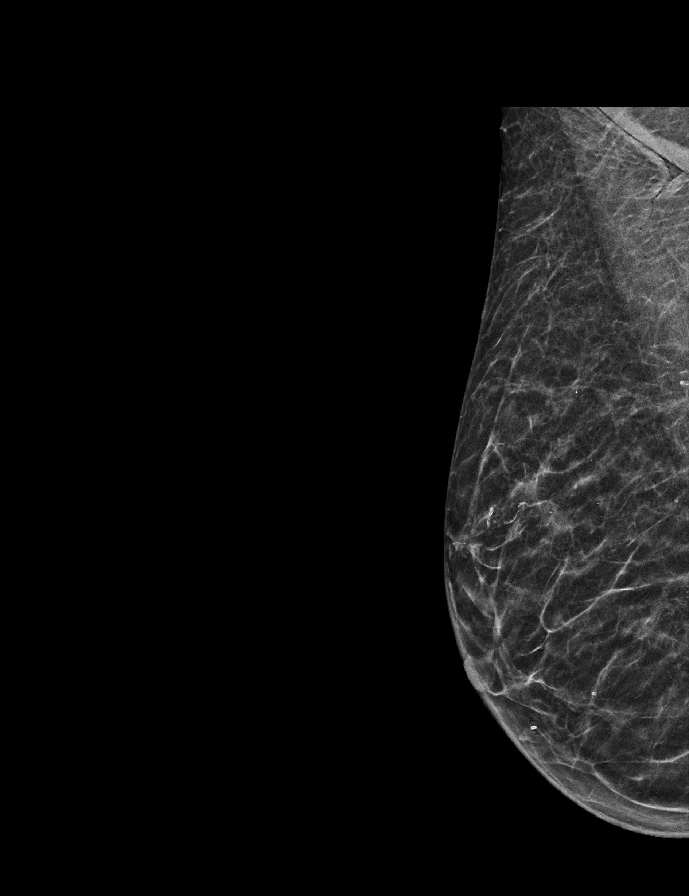

[R CC synth-2D]
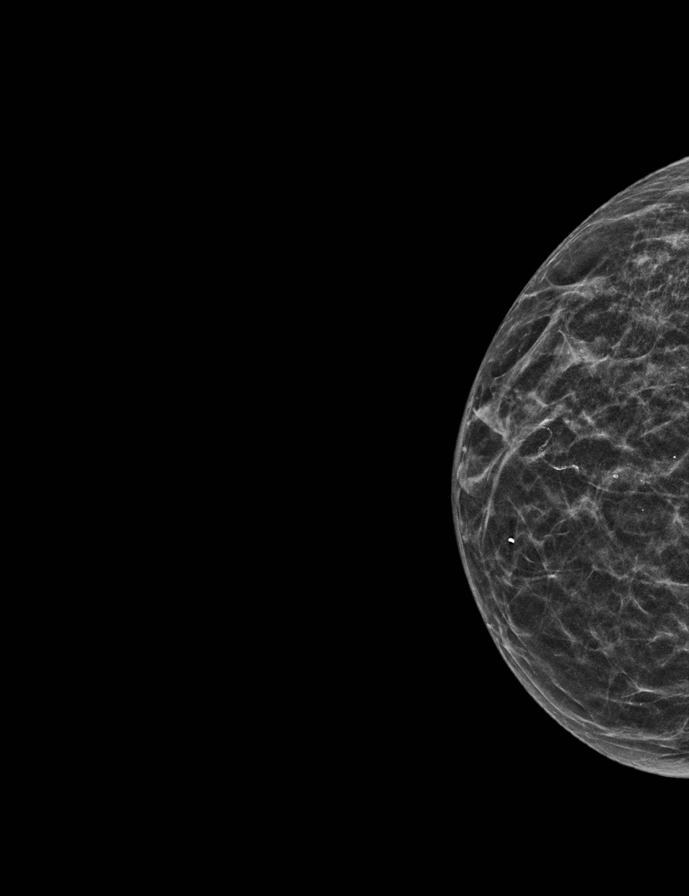

[L XCCL synth-2D (2 of 2)]
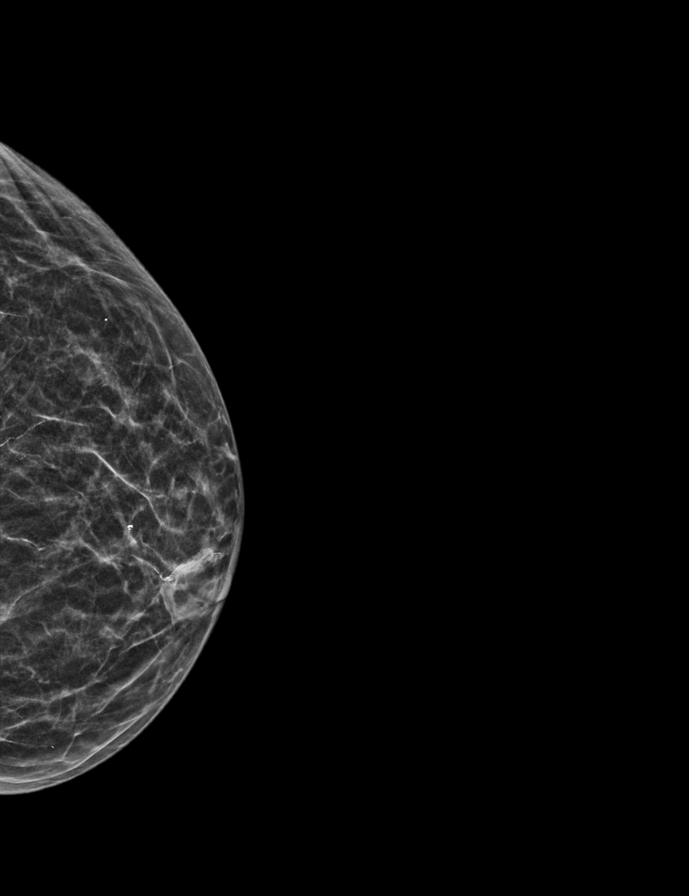

[L MLO synth-2D (2 of 2)]
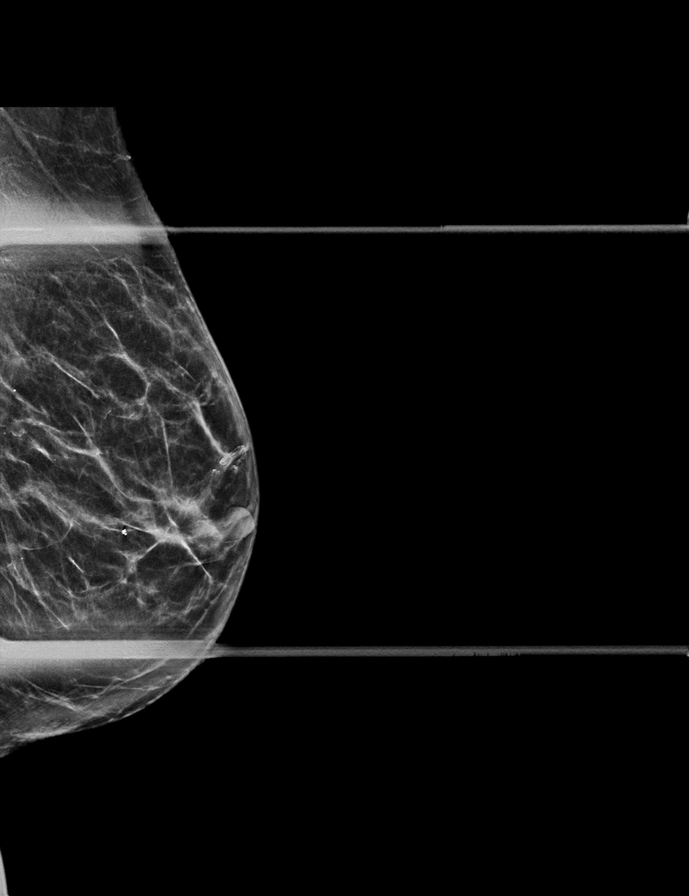

[R CC tomo · tomo slice 17/34.0]
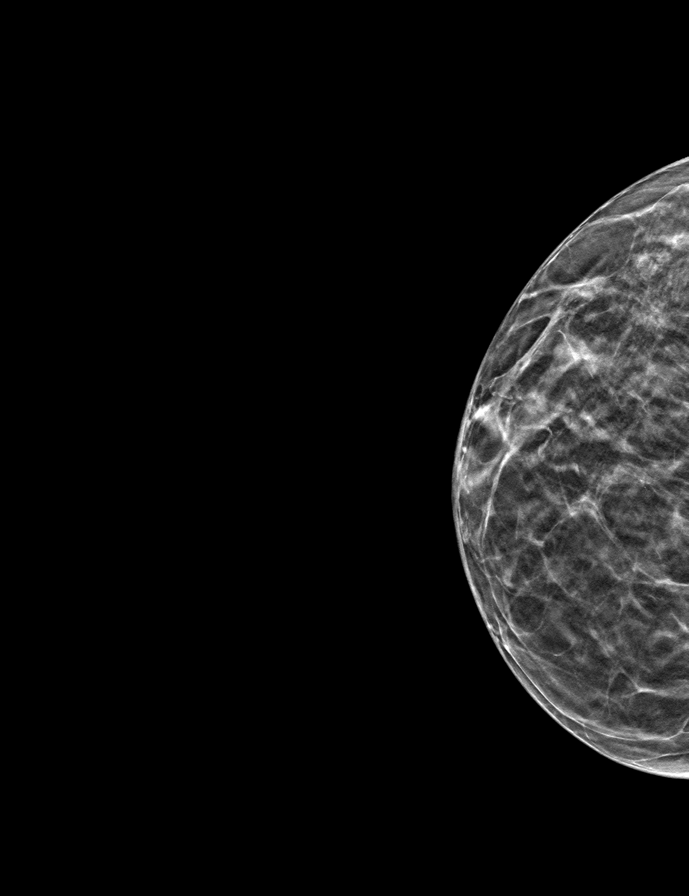

[8 of 40 positions shown; findings below may reference images not displayed]

ACR Breast Density Category c: The breast tissue is heterogeneously
dense, which may obscure small masses.
FINDINGS: In the upper outer left breast, there is a low-density oval mass
measuring 7 mm. No other suspicious calcifications, masses or areas
of distortion are seen in the bilateral breasts.

Ultrasound targeted to the right breast at 11 o'clock, 2 cm from the
nipple demonstrates a stable oval hypoechoic mass measuring 7 x 4 x
5 mm, previously measuring 8 x 4 x 5 mm.

Ultrasound targeted to the left breast at 2 o'clock, 5 cm from the
nipple demonstrates an anechoic oval circumscribed mass measuring 8
x 3 x 8 mm.
IMPRESSION: 1. The right breast mass at 11 o'clock has demonstrated over 2 years
of stability, and is therefore benign.

2.  The mass in the left breast at 2 o'clock is a benign cyst.

3. No suspicious calcifications, masses or areas of distortion are
seen in the bilateral breasts.

RECOMMENDATION:
Screening mammogram in one year.(Code:ZH-[id number])

I have discussed the findings and recommendations with the patient.
If applicable, a reminder letter will be sent to the patient
regarding the next appointment.

BI-RADS CATEGORY  2: Benign.

## 2022-03-28 ENCOUNTER — Other Ambulatory Visit: Payer: Medicare PPO

## 2022-04-01 ENCOUNTER — Ambulatory Visit: Payer: Medicare PPO | Admitting: Podiatry

## 2022-04-24 ENCOUNTER — Ambulatory Visit: Payer: Medicare PPO | Admitting: Podiatry

## 2022-04-27 DIAGNOSIS — H0102B Squamous blepharitis left eye, upper and lower eyelids: Secondary | ICD-10-CM | POA: Diagnosis not present

## 2022-04-27 DIAGNOSIS — H0101B Ulcerative blepharitis left eye, upper and lower eyelids: Secondary | ICD-10-CM | POA: Diagnosis not present

## 2022-05-03 DIAGNOSIS — R2681 Unsteadiness on feet: Secondary | ICD-10-CM | POA: Diagnosis not present

## 2022-05-03 DIAGNOSIS — I10 Essential (primary) hypertension: Secondary | ICD-10-CM | POA: Diagnosis not present

## 2022-05-03 DIAGNOSIS — E46 Unspecified protein-calorie malnutrition: Secondary | ICD-10-CM | POA: Diagnosis not present

## 2022-05-03 DIAGNOSIS — R634 Abnormal weight loss: Secondary | ICD-10-CM | POA: Diagnosis not present

## 2022-05-04 ENCOUNTER — Other Ambulatory Visit: Payer: Self-pay | Admitting: Family Medicine

## 2022-05-04 DIAGNOSIS — R634 Abnormal weight loss: Secondary | ICD-10-CM

## 2022-05-09 ENCOUNTER — Other Ambulatory Visit: Payer: Medicare PPO

## 2022-05-12 ENCOUNTER — Ambulatory Visit: Payer: Medicare PPO | Admitting: Podiatrist

## 2022-05-17 DIAGNOSIS — G20 Parkinson's disease: Secondary | ICD-10-CM | POA: Diagnosis not present

## 2022-05-17 DIAGNOSIS — F419 Anxiety disorder, unspecified: Secondary | ICD-10-CM | POA: Diagnosis not present

## 2022-05-17 DIAGNOSIS — Z79899 Other long term (current) drug therapy: Secondary | ICD-10-CM | POA: Diagnosis not present

## 2022-05-17 DIAGNOSIS — R634 Abnormal weight loss: Secondary | ICD-10-CM | POA: Diagnosis not present

## 2022-05-17 DIAGNOSIS — G2581 Restless legs syndrome: Secondary | ICD-10-CM | POA: Diagnosis not present

## 2022-05-26 ENCOUNTER — Ambulatory Visit: Payer: Medicare PPO | Admitting: Podiatrist

## 2022-05-26 ENCOUNTER — Encounter: Payer: Self-pay | Admitting: Podiatrist

## 2022-05-26 DIAGNOSIS — M79609 Pain in unspecified limb: Secondary | ICD-10-CM | POA: Diagnosis not present

## 2022-05-26 DIAGNOSIS — B351 Tinea unguium: Secondary | ICD-10-CM

## 2022-05-26 NOTE — Progress Notes (Signed)
Chief Complaint  Patient presents with   Nail Problem    Nail fungus- nail trim      Subjective: Hannah Fisher is a 71 y.o. female patient who presents to office today with concern of long,mildly painful nails  while ambulating in shoes; unable to trim.  Patient denies any new cramping, numbness, burning or tingling in the legs or feet.  Patient Active Problem List   Diagnosis Date Noted   Impairment of balance 08/23/2021   Parkinson's disease (HCC) 01/28/2021   Abnormal gait 01/24/2021   Allergic rhinitis 01/24/2021   Allergic rhinitis due to animal (cat) (dog) hair and dander 01/24/2021   Anxiety 01/24/2021   Chronic constipation 01/24/2021   Difficulty sleeping 01/24/2021   Gastritis 01/24/2021   High cholesterol 01/24/2021   Protein calorie malnutrition (HCC) 01/24/2021   Rectal bleeding 01/24/2021   Recurrent falls 01/24/2021   Restless legs 01/24/2021   Sciatica 01/24/2021   Standard chest x-ray abnormal 01/24/2021   Vitamin D deficiency 01/24/2021   Acute encephalopathy 12/28/2020   Rhabdomyolysis 12/28/2020   Lightheadedness 06/22/2020   Other abnormalities of gait and mobility 05/03/2020   Lumbar radiculopathy 04/28/2020   Dysphonia 12/03/2019   Age-related osteoporosis without current pathological fracture 11/25/2019   Dysfunction of both eustachian tubes 05/19/2019   Presbycusis of both ears 05/19/2019   Unilateral deafness, left 04/07/2019   Mass 04/17/2018   Deafness    Shingles    Osteoporosis    Hypertension    Current Outpatient Medications on File Prior to Visit  Medication Sig Dispense Refill   acetaminophen (TYLENOL) 650 MG CR tablet Take 650 mg by mouth in the morning and at bedtime.     ALPRAZolam (XANAX) 0.25 MG tablet Take 1 tablet (0.25 mg total) by mouth 2 (two) times daily as needed for anxiety. 10 tablet 0   amLODipine (NORVASC) 2.5 MG tablet Take 2.5 mg by mouth daily.     aspirin 81 MG EC tablet Take 81 mg by mouth daily.      ciclopirox (PENLAC) 8 % solution Apply topically at bedtime. Apply over nail and surrounding skin. Apply daily over previous coat. After seven (7) days, may remove with alcohol and continue cycle. (Patient not taking: Reported on 09/23/2021) 6.6 mL 2   cyclobenzaprine (FLEXERIL) 5 MG tablet Take 1 tablet (5 mg total) by mouth 3 (three) times daily as needed for muscle spasms. (Patient not taking: Reported on 09/23/2021) 30 tablet 0   feeding supplement (ENSURE ENLIVE / ENSURE PLUS) LIQD Take 237 mLs by mouth 2 (two) times daily between meals. (Patient not taking: Reported on 09/23/2021) 237 mL 12   ketoconazole (NIZORAL) 2 % cream APPLY 1 APPLICATION TO THE AFFECTED AREA(S) DAILY (Patient not taking: Reported on 09/23/2021) 60 g 0   levETIRAcetam (KEPPRA) 500 MG tablet Take 1 tablet (500 mg total) by mouth 2 (two) times daily. (Patient not taking: Reported on 09/23/2021)     lisinopril (ZESTRIL) 2.5 MG tablet Take 2.5 mg by mouth daily.     naproxen sodium (ALEVE) 220 MG tablet Take 220-440 mg by mouth See admin instructions. 220 mg in the morning 440 mg at bedtime     polyethylene glycol (MIRALAX / GLYCOLAX) 17 g packet Take 17 g by mouth daily. (Patient not taking: Reported on 09/23/2021) 14 each 0   rOPINIRole (REQUIP) 0.25 MG tablet Take 0.125 mg by mouth at bedtime.     No current facility-administered medications on file prior to visit.   Allergies  Allergen Reactions   Beta Adrenergic Blockers     Nausea, felling bad.    Penicillin V Swelling   Penicillins Hives   Sulfa Antibiotics Rash     Objective: General: Patient is awake, alert, and oriented x 3 and in no acute distress.  Integument: Skin is warm, dry and supple bilateral. Nails are tender, long, thickened and  dystrophic  1-5 bilateral. No signs of infection. No open lesions or preulcerative lesions present bilateral. Remaining integument unremarkable.  Vasculature:  Dorsalis Pedis pulse 2/4 bilateral. Posterior Tibial  pulse  1/4 bilateral.  Capillary fill time <3 sec 1-5 bilateral.   Neurology: The patient has intact sensation measured with a 5.07/10g Semmes Weinstein Monofilament at all pedal sites bilateral .  Gross sensation is intact bilateral.  Musculoskeletal: No symptomatic pedal deformities noted bilateral. Muscular strength 5/5 in all lower extremity muscular groups bilateral without pain on range of motion . No tenderness with calf compression bilateral.  Assessment and Plan:   ICD-10-CM   1. Pain due to onychomycosis of nail  B35.1    M79.609        -Examined patient. -Mechanically debrided all nails 1-5 bilateral using sterile nail nipper and filed with sterile dremel without incident  -Answered all patient questions -she prefers her nails be cut short and she does not like any corners poking out.  I tried my best to trim them as she requested.  She requests Dr. Ardelle Anton who came by to say Hello today.

## 2022-06-01 ENCOUNTER — Other Ambulatory Visit: Payer: Medicare PPO

## 2022-06-13 ENCOUNTER — Other Ambulatory Visit: Payer: Medicare PPO

## 2022-06-15 DIAGNOSIS — F411 Generalized anxiety disorder: Secondary | ICD-10-CM | POA: Diagnosis not present

## 2022-06-15 DIAGNOSIS — G2581 Restless legs syndrome: Secondary | ICD-10-CM | POA: Diagnosis not present

## 2022-06-15 DIAGNOSIS — E46 Unspecified protein-calorie malnutrition: Secondary | ICD-10-CM | POA: Diagnosis not present

## 2022-06-15 DIAGNOSIS — Z681 Body mass index (BMI) 19 or less, adult: Secondary | ICD-10-CM | POA: Diagnosis not present

## 2022-06-15 DIAGNOSIS — Z0000 Encounter for general adult medical examination without abnormal findings: Secondary | ICD-10-CM | POA: Diagnosis not present

## 2022-06-15 DIAGNOSIS — G20 Parkinson's disease: Secondary | ICD-10-CM | POA: Diagnosis not present

## 2022-06-15 DIAGNOSIS — I10 Essential (primary) hypertension: Secondary | ICD-10-CM | POA: Diagnosis not present

## 2022-06-15 DIAGNOSIS — E559 Vitamin D deficiency, unspecified: Secondary | ICD-10-CM | POA: Diagnosis not present

## 2022-06-15 DIAGNOSIS — E7800 Pure hypercholesterolemia, unspecified: Secondary | ICD-10-CM | POA: Diagnosis not present

## 2022-07-07 ENCOUNTER — Other Ambulatory Visit: Payer: Medicare PPO

## 2022-07-12 DIAGNOSIS — H0101B Ulcerative blepharitis left eye, upper and lower eyelids: Secondary | ICD-10-CM | POA: Diagnosis not present

## 2022-07-12 DIAGNOSIS — H0101A Ulcerative blepharitis right eye, upper and lower eyelids: Secondary | ICD-10-CM | POA: Diagnosis not present

## 2022-07-12 DIAGNOSIS — H0011 Chalazion right upper eyelid: Secondary | ICD-10-CM | POA: Diagnosis not present

## 2022-07-27 ENCOUNTER — Ambulatory Visit: Payer: Medicare PPO | Admitting: Podiatry

## 2022-08-11 ENCOUNTER — Ambulatory Visit: Payer: Medicare PPO | Admitting: Podiatrist

## 2022-08-30 DIAGNOSIS — H0101B Ulcerative blepharitis left eye, upper and lower eyelids: Secondary | ICD-10-CM | POA: Diagnosis not present

## 2022-08-30 DIAGNOSIS — H0101A Ulcerative blepharitis right eye, upper and lower eyelids: Secondary | ICD-10-CM | POA: Diagnosis not present

## 2022-08-30 DIAGNOSIS — H2513 Age-related nuclear cataract, bilateral: Secondary | ICD-10-CM | POA: Diagnosis not present

## 2022-08-30 DIAGNOSIS — H43393 Other vitreous opacities, bilateral: Secondary | ICD-10-CM | POA: Diagnosis not present

## 2022-09-04 ENCOUNTER — Ambulatory Visit: Payer: Medicare PPO | Admitting: Podiatry

## 2022-09-06 ENCOUNTER — Other Ambulatory Visit: Payer: Self-pay | Admitting: Family Medicine

## 2022-09-06 ENCOUNTER — Other Ambulatory Visit: Payer: Medicare PPO

## 2022-09-06 DIAGNOSIS — M810 Age-related osteoporosis without current pathological fracture: Secondary | ICD-10-CM

## 2022-09-12 ENCOUNTER — Other Ambulatory Visit: Payer: Medicare PPO

## 2022-09-18 DIAGNOSIS — G20A1 Parkinson's disease without dyskinesia, without mention of fluctuations: Secondary | ICD-10-CM | POA: Diagnosis not present

## 2022-09-18 DIAGNOSIS — Z79899 Other long term (current) drug therapy: Secondary | ICD-10-CM | POA: Diagnosis not present

## 2022-09-18 DIAGNOSIS — G20B2 Parkinson's disease with dyskinesia, with fluctuations: Secondary | ICD-10-CM | POA: Diagnosis not present

## 2022-09-18 DIAGNOSIS — F419 Anxiety disorder, unspecified: Secondary | ICD-10-CM | POA: Diagnosis not present

## 2022-09-19 ENCOUNTER — Ambulatory Visit: Payer: Medicare PPO | Admitting: Podiatry

## 2022-10-30 DIAGNOSIS — Z9181 History of falling: Secondary | ICD-10-CM | POA: Diagnosis not present

## 2022-10-30 DIAGNOSIS — E46 Unspecified protein-calorie malnutrition: Secondary | ICD-10-CM | POA: Diagnosis not present

## 2022-10-30 DIAGNOSIS — E785 Hyperlipidemia, unspecified: Secondary | ICD-10-CM | POA: Diagnosis not present

## 2022-10-30 DIAGNOSIS — I10 Essential (primary) hypertension: Secondary | ICD-10-CM | POA: Diagnosis not present

## 2022-10-30 DIAGNOSIS — G20B2 Parkinson's disease with dyskinesia, with fluctuations: Secondary | ICD-10-CM | POA: Diagnosis not present

## 2022-10-30 DIAGNOSIS — Z993 Dependence on wheelchair: Secondary | ICD-10-CM | POA: Diagnosis not present

## 2022-10-30 DIAGNOSIS — F419 Anxiety disorder, unspecified: Secondary | ICD-10-CM | POA: Diagnosis not present

## 2022-11-07 DIAGNOSIS — G20B2 Parkinson's disease with dyskinesia, with fluctuations: Secondary | ICD-10-CM | POA: Diagnosis not present

## 2022-11-07 DIAGNOSIS — I10 Essential (primary) hypertension: Secondary | ICD-10-CM | POA: Diagnosis not present

## 2022-11-07 DIAGNOSIS — Z9181 History of falling: Secondary | ICD-10-CM | POA: Diagnosis not present

## 2022-11-07 DIAGNOSIS — F419 Anxiety disorder, unspecified: Secondary | ICD-10-CM | POA: Diagnosis not present

## 2022-11-07 DIAGNOSIS — Z993 Dependence on wheelchair: Secondary | ICD-10-CM | POA: Diagnosis not present

## 2022-11-07 DIAGNOSIS — E46 Unspecified protein-calorie malnutrition: Secondary | ICD-10-CM | POA: Diagnosis not present

## 2022-11-07 DIAGNOSIS — E785 Hyperlipidemia, unspecified: Secondary | ICD-10-CM | POA: Diagnosis not present

## 2022-11-15 DIAGNOSIS — E785 Hyperlipidemia, unspecified: Secondary | ICD-10-CM | POA: Diagnosis not present

## 2022-11-15 DIAGNOSIS — Z993 Dependence on wheelchair: Secondary | ICD-10-CM | POA: Diagnosis not present

## 2022-11-15 DIAGNOSIS — G20B2 Parkinson's disease with dyskinesia, with fluctuations: Secondary | ICD-10-CM | POA: Diagnosis not present

## 2022-11-15 DIAGNOSIS — F419 Anxiety disorder, unspecified: Secondary | ICD-10-CM | POA: Diagnosis not present

## 2022-11-15 DIAGNOSIS — Z9181 History of falling: Secondary | ICD-10-CM | POA: Diagnosis not present

## 2022-11-15 DIAGNOSIS — I10 Essential (primary) hypertension: Secondary | ICD-10-CM | POA: Diagnosis not present

## 2022-11-15 DIAGNOSIS — E46 Unspecified protein-calorie malnutrition: Secondary | ICD-10-CM | POA: Diagnosis not present

## 2022-11-16 DIAGNOSIS — E785 Hyperlipidemia, unspecified: Secondary | ICD-10-CM | POA: Diagnosis not present

## 2022-11-16 DIAGNOSIS — G20B2 Parkinson's disease with dyskinesia, with fluctuations: Secondary | ICD-10-CM | POA: Diagnosis not present

## 2022-11-16 DIAGNOSIS — Z993 Dependence on wheelchair: Secondary | ICD-10-CM | POA: Diagnosis not present

## 2022-11-16 DIAGNOSIS — F419 Anxiety disorder, unspecified: Secondary | ICD-10-CM | POA: Diagnosis not present

## 2022-11-16 DIAGNOSIS — I10 Essential (primary) hypertension: Secondary | ICD-10-CM | POA: Diagnosis not present

## 2022-11-16 DIAGNOSIS — E46 Unspecified protein-calorie malnutrition: Secondary | ICD-10-CM | POA: Diagnosis not present

## 2022-11-16 DIAGNOSIS — Z9181 History of falling: Secondary | ICD-10-CM | POA: Diagnosis not present

## 2022-11-17 DIAGNOSIS — F419 Anxiety disorder, unspecified: Secondary | ICD-10-CM | POA: Diagnosis not present

## 2022-11-17 DIAGNOSIS — Z993 Dependence on wheelchair: Secondary | ICD-10-CM | POA: Diagnosis not present

## 2022-11-17 DIAGNOSIS — G20B2 Parkinson's disease with dyskinesia, with fluctuations: Secondary | ICD-10-CM | POA: Diagnosis not present

## 2022-11-17 DIAGNOSIS — I10 Essential (primary) hypertension: Secondary | ICD-10-CM | POA: Diagnosis not present

## 2022-11-17 DIAGNOSIS — Z9181 History of falling: Secondary | ICD-10-CM | POA: Diagnosis not present

## 2022-11-17 DIAGNOSIS — E785 Hyperlipidemia, unspecified: Secondary | ICD-10-CM | POA: Diagnosis not present

## 2022-11-17 DIAGNOSIS — E46 Unspecified protein-calorie malnutrition: Secondary | ICD-10-CM | POA: Diagnosis not present

## 2022-11-22 DIAGNOSIS — I10 Essential (primary) hypertension: Secondary | ICD-10-CM | POA: Diagnosis not present

## 2022-11-22 DIAGNOSIS — E46 Unspecified protein-calorie malnutrition: Secondary | ICD-10-CM | POA: Diagnosis not present

## 2022-11-22 DIAGNOSIS — Z9181 History of falling: Secondary | ICD-10-CM | POA: Diagnosis not present

## 2022-11-22 DIAGNOSIS — E785 Hyperlipidemia, unspecified: Secondary | ICD-10-CM | POA: Diagnosis not present

## 2022-11-22 DIAGNOSIS — Z993 Dependence on wheelchair: Secondary | ICD-10-CM | POA: Diagnosis not present

## 2022-11-22 DIAGNOSIS — G20B2 Parkinson's disease with dyskinesia, with fluctuations: Secondary | ICD-10-CM | POA: Diagnosis not present

## 2022-11-22 DIAGNOSIS — F419 Anxiety disorder, unspecified: Secondary | ICD-10-CM | POA: Diagnosis not present

## 2022-11-23 ENCOUNTER — Ambulatory Visit: Payer: Medicare PPO | Admitting: Podiatry

## 2022-11-23 DIAGNOSIS — R399 Unspecified symptoms and signs involving the genitourinary system: Secondary | ICD-10-CM | POA: Diagnosis not present

## 2022-11-23 DIAGNOSIS — B351 Tinea unguium: Secondary | ICD-10-CM | POA: Diagnosis not present

## 2022-11-23 DIAGNOSIS — E46 Unspecified protein-calorie malnutrition: Secondary | ICD-10-CM | POA: Diagnosis not present

## 2022-11-23 DIAGNOSIS — G20A1 Parkinson's disease without dyskinesia, without mention of fluctuations: Secondary | ICD-10-CM | POA: Diagnosis not present

## 2022-11-23 DIAGNOSIS — Z681 Body mass index (BMI) 19 or less, adult: Secondary | ICD-10-CM | POA: Diagnosis not present

## 2022-11-23 DIAGNOSIS — M79609 Pain in unspecified limb: Secondary | ICD-10-CM

## 2022-11-23 DIAGNOSIS — G2581 Restless legs syndrome: Secondary | ICD-10-CM | POA: Diagnosis not present

## 2022-11-23 DIAGNOSIS — G479 Sleep disorder, unspecified: Secondary | ICD-10-CM | POA: Diagnosis not present

## 2022-11-23 DIAGNOSIS — K5909 Other constipation: Secondary | ICD-10-CM | POA: Diagnosis not present

## 2022-11-24 DIAGNOSIS — F419 Anxiety disorder, unspecified: Secondary | ICD-10-CM | POA: Diagnosis not present

## 2022-11-24 DIAGNOSIS — I10 Essential (primary) hypertension: Secondary | ICD-10-CM | POA: Diagnosis not present

## 2022-11-24 DIAGNOSIS — Z993 Dependence on wheelchair: Secondary | ICD-10-CM | POA: Diagnosis not present

## 2022-11-24 DIAGNOSIS — Z9181 History of falling: Secondary | ICD-10-CM | POA: Diagnosis not present

## 2022-11-24 DIAGNOSIS — G20B2 Parkinson's disease with dyskinesia, with fluctuations: Secondary | ICD-10-CM | POA: Diagnosis not present

## 2022-11-24 DIAGNOSIS — E785 Hyperlipidemia, unspecified: Secondary | ICD-10-CM | POA: Diagnosis not present

## 2022-11-24 DIAGNOSIS — E46 Unspecified protein-calorie malnutrition: Secondary | ICD-10-CM | POA: Diagnosis not present

## 2022-11-25 NOTE — Progress Notes (Signed)
  Subjective: Chief Complaint  Patient presents with   Nail Problem    Routine foot care, nail trim     Hannah Fisher is a 71 y.o. female patient who presents to office today with concern of long,mildly painful nails.  No open lesions that she reports.  No swelling redness or drainage.  No other concerns.  Objective: General: Patient is awake, alert, and oriented x 3 and in no acute distress.  Integument: Skin is warm, dry and supple bilateral. Nails are tender, long, thickened and  dystrophic  1-5 bilateral. No signs of infection. No open lesions or preulcerative lesions present bilateral. Remaining integument unremarkable.  Vasculature:  Dorsalis Pedis pulse 2/4 bilateral. Posterior Tibial pulse  1/4 bilateral.  Capillary fill time <3 sec 1-5 bilateral.   Neurology: The patient has intact sensation intact.  Musculoskeletal: No other areas of discomfort.  Assessment and Plan: Symptomatic onychomycosis  -Treatment options discussed including all alternatives, risks, and complications -Etiology of symptoms were discussed -Nails debrided 10 without complications or bleeding. -Daily foot inspection -Follow-up in 3 months or sooner if any problems arise. In the meantime, encouraged to call the office with any questions, concerns, change in symptoms.   Celesta Gentile, DPM

## 2022-11-27 DIAGNOSIS — E785 Hyperlipidemia, unspecified: Secondary | ICD-10-CM | POA: Diagnosis not present

## 2022-11-27 DIAGNOSIS — Z993 Dependence on wheelchair: Secondary | ICD-10-CM | POA: Diagnosis not present

## 2022-11-27 DIAGNOSIS — Z9181 History of falling: Secondary | ICD-10-CM | POA: Diagnosis not present

## 2022-11-27 DIAGNOSIS — E46 Unspecified protein-calorie malnutrition: Secondary | ICD-10-CM | POA: Diagnosis not present

## 2022-11-27 DIAGNOSIS — G20B2 Parkinson's disease with dyskinesia, with fluctuations: Secondary | ICD-10-CM | POA: Diagnosis not present

## 2022-11-27 DIAGNOSIS — I10 Essential (primary) hypertension: Secondary | ICD-10-CM | POA: Diagnosis not present

## 2022-11-27 DIAGNOSIS — F419 Anxiety disorder, unspecified: Secondary | ICD-10-CM | POA: Diagnosis not present

## 2022-11-28 DIAGNOSIS — Z9181 History of falling: Secondary | ICD-10-CM | POA: Diagnosis not present

## 2022-11-28 DIAGNOSIS — E46 Unspecified protein-calorie malnutrition: Secondary | ICD-10-CM | POA: Diagnosis not present

## 2022-11-28 DIAGNOSIS — G20B2 Parkinson's disease with dyskinesia, with fluctuations: Secondary | ICD-10-CM | POA: Diagnosis not present

## 2022-11-28 DIAGNOSIS — Z993 Dependence on wheelchair: Secondary | ICD-10-CM | POA: Diagnosis not present

## 2022-11-28 DIAGNOSIS — I10 Essential (primary) hypertension: Secondary | ICD-10-CM | POA: Diagnosis not present

## 2022-11-28 DIAGNOSIS — F419 Anxiety disorder, unspecified: Secondary | ICD-10-CM | POA: Diagnosis not present

## 2022-11-28 DIAGNOSIS — E785 Hyperlipidemia, unspecified: Secondary | ICD-10-CM | POA: Diagnosis not present

## 2022-11-29 DIAGNOSIS — G20B2 Parkinson's disease with dyskinesia, with fluctuations: Secondary | ICD-10-CM | POA: Diagnosis not present

## 2022-11-29 DIAGNOSIS — E785 Hyperlipidemia, unspecified: Secondary | ICD-10-CM | POA: Diagnosis not present

## 2022-11-29 DIAGNOSIS — E46 Unspecified protein-calorie malnutrition: Secondary | ICD-10-CM | POA: Diagnosis not present

## 2022-11-29 DIAGNOSIS — Z9181 History of falling: Secondary | ICD-10-CM | POA: Diagnosis not present

## 2022-11-29 DIAGNOSIS — F419 Anxiety disorder, unspecified: Secondary | ICD-10-CM | POA: Diagnosis not present

## 2022-11-29 DIAGNOSIS — I10 Essential (primary) hypertension: Secondary | ICD-10-CM | POA: Diagnosis not present

## 2022-11-29 DIAGNOSIS — Z993 Dependence on wheelchair: Secondary | ICD-10-CM | POA: Diagnosis not present

## 2022-12-04 DIAGNOSIS — I10 Essential (primary) hypertension: Secondary | ICD-10-CM | POA: Diagnosis not present

## 2022-12-04 DIAGNOSIS — Z993 Dependence on wheelchair: Secondary | ICD-10-CM | POA: Diagnosis not present

## 2022-12-04 DIAGNOSIS — E785 Hyperlipidemia, unspecified: Secondary | ICD-10-CM | POA: Diagnosis not present

## 2022-12-04 DIAGNOSIS — E46 Unspecified protein-calorie malnutrition: Secondary | ICD-10-CM | POA: Diagnosis not present

## 2022-12-04 DIAGNOSIS — Z9181 History of falling: Secondary | ICD-10-CM | POA: Diagnosis not present

## 2022-12-04 DIAGNOSIS — F419 Anxiety disorder, unspecified: Secondary | ICD-10-CM | POA: Diagnosis not present

## 2022-12-04 DIAGNOSIS — G20B2 Parkinson's disease with dyskinesia, with fluctuations: Secondary | ICD-10-CM | POA: Diagnosis not present

## 2022-12-11 DIAGNOSIS — G20B2 Parkinson's disease with dyskinesia, with fluctuations: Secondary | ICD-10-CM | POA: Diagnosis not present

## 2022-12-11 DIAGNOSIS — Z993 Dependence on wheelchair: Secondary | ICD-10-CM | POA: Diagnosis not present

## 2022-12-11 DIAGNOSIS — F419 Anxiety disorder, unspecified: Secondary | ICD-10-CM | POA: Diagnosis not present

## 2022-12-11 DIAGNOSIS — E785 Hyperlipidemia, unspecified: Secondary | ICD-10-CM | POA: Diagnosis not present

## 2022-12-11 DIAGNOSIS — Z9181 History of falling: Secondary | ICD-10-CM | POA: Diagnosis not present

## 2022-12-11 DIAGNOSIS — I10 Essential (primary) hypertension: Secondary | ICD-10-CM | POA: Diagnosis not present

## 2022-12-11 DIAGNOSIS — E46 Unspecified protein-calorie malnutrition: Secondary | ICD-10-CM | POA: Diagnosis not present

## 2022-12-12 DIAGNOSIS — F419 Anxiety disorder, unspecified: Secondary | ICD-10-CM | POA: Diagnosis not present

## 2022-12-12 DIAGNOSIS — E46 Unspecified protein-calorie malnutrition: Secondary | ICD-10-CM | POA: Diagnosis not present

## 2022-12-12 DIAGNOSIS — G20B2 Parkinson's disease with dyskinesia, with fluctuations: Secondary | ICD-10-CM | POA: Diagnosis not present

## 2022-12-12 DIAGNOSIS — Z9181 History of falling: Secondary | ICD-10-CM | POA: Diagnosis not present

## 2022-12-12 DIAGNOSIS — Z993 Dependence on wheelchair: Secondary | ICD-10-CM | POA: Diagnosis not present

## 2022-12-12 DIAGNOSIS — E785 Hyperlipidemia, unspecified: Secondary | ICD-10-CM | POA: Diagnosis not present

## 2022-12-12 DIAGNOSIS — I10 Essential (primary) hypertension: Secondary | ICD-10-CM | POA: Diagnosis not present

## 2022-12-18 DIAGNOSIS — E785 Hyperlipidemia, unspecified: Secondary | ICD-10-CM | POA: Diagnosis not present

## 2022-12-18 DIAGNOSIS — Z993 Dependence on wheelchair: Secondary | ICD-10-CM | POA: Diagnosis not present

## 2022-12-18 DIAGNOSIS — F419 Anxiety disorder, unspecified: Secondary | ICD-10-CM | POA: Diagnosis not present

## 2022-12-18 DIAGNOSIS — E46 Unspecified protein-calorie malnutrition: Secondary | ICD-10-CM | POA: Diagnosis not present

## 2022-12-18 DIAGNOSIS — Z9181 History of falling: Secondary | ICD-10-CM | POA: Diagnosis not present

## 2022-12-18 DIAGNOSIS — G20B2 Parkinson's disease with dyskinesia, with fluctuations: Secondary | ICD-10-CM | POA: Diagnosis not present

## 2022-12-18 DIAGNOSIS — I10 Essential (primary) hypertension: Secondary | ICD-10-CM | POA: Diagnosis not present

## 2023-01-29 DIAGNOSIS — G20B2 Parkinson's disease with dyskinesia, with fluctuations: Secondary | ICD-10-CM | POA: Diagnosis not present

## 2023-02-19 ENCOUNTER — Other Ambulatory Visit: Payer: Self-pay

## 2023-02-19 ENCOUNTER — Encounter (HOSPITAL_COMMUNITY): Payer: Self-pay | Admitting: Family Medicine

## 2023-02-19 ENCOUNTER — Emergency Department (HOSPITAL_COMMUNITY): Payer: Medicare PPO

## 2023-02-19 ENCOUNTER — Inpatient Hospital Stay (HOSPITAL_COMMUNITY)
Admission: EM | Admit: 2023-02-19 | Discharge: 2023-02-23 | DRG: 641 | Disposition: A | Discharge status: Skilled Nursing Facility | Payer: Medicare PPO | Attending: Family Medicine | Admitting: Family Medicine

## 2023-02-19 DIAGNOSIS — R1311 Dysphagia, oral phase: Secondary | ICD-10-CM | POA: Diagnosis not present

## 2023-02-19 DIAGNOSIS — R197 Diarrhea, unspecified: Secondary | ICD-10-CM | POA: Diagnosis present

## 2023-02-19 DIAGNOSIS — R296 Repeated falls: Secondary | ICD-10-CM | POA: Diagnosis present

## 2023-02-19 DIAGNOSIS — G20A1 Parkinson's disease without dyskinesia, without mention of fluctuations: Secondary | ICD-10-CM | POA: Diagnosis present

## 2023-02-19 DIAGNOSIS — Z7982 Long term (current) use of aspirin: Secondary | ICD-10-CM | POA: Diagnosis not present

## 2023-02-19 DIAGNOSIS — R636 Underweight: Secondary | ICD-10-CM | POA: Diagnosis present

## 2023-02-19 DIAGNOSIS — M810 Age-related osteoporosis without current pathological fracture: Secondary | ICD-10-CM | POA: Diagnosis present

## 2023-02-19 DIAGNOSIS — Z7401 Bed confinement status: Secondary | ICD-10-CM | POA: Diagnosis not present

## 2023-02-19 DIAGNOSIS — Z880 Allergy status to penicillin: Secondary | ICD-10-CM | POA: Diagnosis not present

## 2023-02-19 DIAGNOSIS — Z87442 Personal history of urinary calculi: Secondary | ICD-10-CM

## 2023-02-19 DIAGNOSIS — Z888 Allergy status to other drugs, medicaments and biological substances status: Secondary | ICD-10-CM

## 2023-02-19 DIAGNOSIS — F0280 Dementia in other diseases classified elsewhere without behavioral disturbance: Secondary | ICD-10-CM | POA: Diagnosis present

## 2023-02-19 DIAGNOSIS — F02A0 Dementia in other diseases classified elsewhere, mild, without behavioral disturbance, psychotic disturbance, mood disturbance, and anxiety: Secondary | ICD-10-CM | POA: Diagnosis not present

## 2023-02-19 DIAGNOSIS — Z681 Body mass index (BMI) 19 or less, adult: Secondary | ICD-10-CM

## 2023-02-19 DIAGNOSIS — I10 Essential (primary) hypertension: Secondary | ICD-10-CM | POA: Diagnosis present

## 2023-02-19 DIAGNOSIS — Z823 Family history of stroke: Secondary | ICD-10-CM | POA: Diagnosis not present

## 2023-02-19 DIAGNOSIS — Z882 Allergy status to sulfonamides status: Secondary | ICD-10-CM | POA: Diagnosis not present

## 2023-02-19 DIAGNOSIS — Z9181 History of falling: Secondary | ICD-10-CM

## 2023-02-19 DIAGNOSIS — Z79899 Other long term (current) drug therapy: Secondary | ICD-10-CM | POA: Diagnosis not present

## 2023-02-19 DIAGNOSIS — Z90710 Acquired absence of both cervix and uterus: Secondary | ICD-10-CM

## 2023-02-19 DIAGNOSIS — R2689 Other abnormalities of gait and mobility: Secondary | ICD-10-CM | POA: Diagnosis not present

## 2023-02-19 DIAGNOSIS — G20B2 Parkinson's disease with dyskinesia, with fluctuations: Secondary | ICD-10-CM | POA: Diagnosis not present

## 2023-02-19 DIAGNOSIS — M6281 Muscle weakness (generalized): Secondary | ICD-10-CM | POA: Diagnosis not present

## 2023-02-19 DIAGNOSIS — E538 Deficiency of other specified B group vitamins: Secondary | ICD-10-CM | POA: Diagnosis present

## 2023-02-19 DIAGNOSIS — Z8249 Family history of ischemic heart disease and other diseases of the circulatory system: Secondary | ICD-10-CM | POA: Diagnosis not present

## 2023-02-19 DIAGNOSIS — I6381 Other cerebral infarction due to occlusion or stenosis of small artery: Secondary | ICD-10-CM | POA: Diagnosis not present

## 2023-02-19 DIAGNOSIS — E876 Hypokalemia: Principal | ICD-10-CM | POA: Diagnosis present

## 2023-02-19 DIAGNOSIS — R531 Weakness: Secondary | ICD-10-CM | POA: Diagnosis not present

## 2023-02-19 DIAGNOSIS — W19XXXA Unspecified fall, initial encounter: Secondary | ICD-10-CM | POA: Diagnosis present

## 2023-02-19 DIAGNOSIS — H9192 Unspecified hearing loss, left ear: Secondary | ICD-10-CM | POA: Diagnosis present

## 2023-02-19 DIAGNOSIS — R41841 Cognitive communication deficit: Secondary | ICD-10-CM | POA: Diagnosis not present

## 2023-02-19 DIAGNOSIS — R54 Age-related physical debility: Secondary | ICD-10-CM | POA: Diagnosis present

## 2023-02-19 LAB — CBC WITH DIFFERENTIAL/PLATELET
Abs Immature Granulocytes: 0.02 10*3/uL (ref 0.00–0.07)
Basophils Absolute: 0 10*3/uL (ref 0.0–0.1)
Basophils Relative: 0 %
Eosinophils Absolute: 0.1 10*3/uL (ref 0.0–0.5)
Eosinophils Relative: 1 %
HCT: 40.8 % (ref 36.0–46.0)
Hemoglobin: 13.2 g/dL (ref 12.0–15.0)
Immature Granulocytes: 0 %
Lymphocytes Relative: 33 %
Lymphs Abs: 2.3 10*3/uL (ref 0.7–4.0)
MCH: 30.9 pg (ref 26.0–34.0)
MCHC: 32.4 g/dL (ref 30.0–36.0)
MCV: 95.6 fL (ref 80.0–100.0)
Monocytes Absolute: 0.6 10*3/uL (ref 0.1–1.0)
Monocytes Relative: 9 %
Neutro Abs: 3.8 10*3/uL (ref 1.7–7.7)
Neutrophils Relative %: 57 %
Platelets: 232 10*3/uL (ref 150–400)
RBC: 4.27 MIL/uL (ref 3.87–5.11)
RDW: 13.4 % (ref 11.5–15.5)
WBC: 6.8 10*3/uL (ref 4.0–10.5)
nRBC: 0 % (ref 0.0–0.2)

## 2023-02-19 LAB — URINALYSIS, ROUTINE W REFLEX MICROSCOPIC
Bilirubin Urine: NEGATIVE
Glucose, UA: NEGATIVE mg/dL
Hgb urine dipstick: NEGATIVE
Ketones, ur: 5 mg/dL — AB
Nitrite: NEGATIVE
Protein, ur: NEGATIVE mg/dL
Specific Gravity, Urine: 1.02 (ref 1.005–1.030)
pH: 6 (ref 5.0–8.0)

## 2023-02-19 LAB — BASIC METABOLIC PANEL
Anion gap: 10 (ref 5–15)
BUN: 19 mg/dL (ref 8–23)
CO2: 26 mmol/L (ref 22–32)
Calcium: 9.5 mg/dL (ref 8.9–10.3)
Chloride: 106 mmol/L (ref 98–111)
Creatinine, Ser: 0.78 mg/dL (ref 0.44–1.00)
GFR, Estimated: >60 mL/min (ref >60)
Glucose, Bld: 92 mg/dL (ref 70–99)
Potassium: 2.4 mmol/L — CL (ref 3.5–5.1)
Sodium: 142 mmol/L (ref 135–145)

## 2023-02-19 LAB — TROPONIN I (HIGH SENSITIVITY)
Troponin I (High Sensitivity): 3 ng/L (ref <18)
Troponin I (High Sensitivity): 3 ng/L (ref <18)

## 2023-02-19 LAB — CK: Total CK: 60 U/L (ref 38–234)

## 2023-02-19 MED ORDER — LISINOPRIL 5 MG PO TABS
2.5000 mg | ORAL_TABLET | Freq: Every day | ORAL | Status: DC
  Administered 2023-02-19 – 2023-02-23 (×5): 2.5 mg via ORAL
  Filled 2023-02-19 (×6): qty 1

## 2023-02-19 MED ORDER — CARBIDOPA-LEVODOPA 25-100 MG PO TABS
1.5000 | ORAL_TABLET | ORAL | Status: DC
  Administered 2023-02-20 – 2023-02-22 (×3): 1.5 via ORAL
  Filled 2023-02-19 (×5): qty 1.5

## 2023-02-19 MED ORDER — POTASSIUM CHLORIDE CRYS ER 20 MEQ PO TBCR
40.0000 meq | EXTENDED_RELEASE_TABLET | Freq: Once | ORAL | Status: AC
  Administered 2023-02-19: 40 meq via ORAL
  Filled 2023-02-19: qty 2

## 2023-02-19 MED ORDER — ENSURE ENLIVE PO LIQD
237.0000 mL | Freq: Two times a day (BID) | ORAL | Status: DC
  Administered 2023-02-20 – 2023-02-21 (×3): 237 mL via ORAL

## 2023-02-19 MED ORDER — POLYETHYLENE GLYCOL 3350 17 G PO PACK
17.0000 g | PACK | Freq: Every day | ORAL | Status: DC
  Administered 2023-02-19 – 2023-02-23 (×5): 17 g via ORAL
  Filled 2023-02-19 (×5): qty 1

## 2023-02-19 MED ORDER — CARBIDOPA-LEVODOPA 25-100 MG PO TABS
2.0000 | ORAL_TABLET | ORAL | Status: DC
  Administered 2023-02-20 – 2023-02-23 (×8): 2 via ORAL
  Filled 2023-02-19 (×9): qty 2

## 2023-02-19 MED ORDER — CARBIDOPA-LEVODOPA 25-100 MG PO TABS: 1.5000 | ORAL_TABLET | ORAL | Status: DC

## 2023-02-19 MED ORDER — ENOXAPARIN SODIUM 40 MG/0.4ML IJ SOSY
40.0000 mg | PREFILLED_SYRINGE | INTRAMUSCULAR | Status: DC
  Administered 2023-02-19 – 2023-02-20 (×2): 40 mg via SUBCUTANEOUS
  Filled 2023-02-19 (×4): qty 0.4

## 2023-02-19 MED ORDER — ROPINIROLE HCL 0.25 MG PO TABS
0.1250 mg | ORAL_TABLET | Freq: Every day | ORAL | Status: DC
  Administered 2023-02-19 – 2023-02-22 (×4): 0.125 mg via ORAL
  Filled 2023-02-19 (×6): qty 0.5

## 2023-02-19 MED ORDER — POTASSIUM CHLORIDE 10 MEQ/100ML IV SOLN
10.0000 meq | Freq: Once | INTRAVENOUS | Status: AC
  Administered 2023-02-19: 10 meq via INTRAVENOUS
  Filled 2023-02-19 (×2): qty 100

## 2023-02-19 MED ORDER — ASPIRIN 81 MG PO TBEC
81.0000 mg | DELAYED_RELEASE_TABLET | Freq: Every day | ORAL | Status: DC
  Administered 2023-02-19 – 2023-02-23 (×5): 81 mg via ORAL
  Filled 2023-02-19 (×5): qty 1

## 2023-02-19 MED ORDER — ACETAMINOPHEN 325 MG PO TABS
650.0000 mg | ORAL_TABLET | Freq: Two times a day (BID) | ORAL | Status: DC
  Administered 2023-02-19 – 2023-02-23 (×8): 650 mg via ORAL
  Filled 2023-02-19 (×9): qty 2

## 2023-02-19 MED ORDER — POTASSIUM CHLORIDE 10 MEQ/100ML IV SOLN
10.0000 meq | Freq: Once | INTRAVENOUS | Status: AC
  Administered 2023-02-19: 10 meq via INTRAVENOUS
  Filled 2023-02-19: qty 100

## 2023-02-19 NOTE — Assessment & Plan Note (Signed)
-  suspect due to decrease oral intake, hypokalemia in the setting of Parkinson's disease -PT evaluation -daughter ultimately wants her place back into ALF or SNF

## 2023-02-19 NOTE — H&P (Signed)
History and Physical    Patient: Hannah Fisher WUJ:[id number] DOB: 1951-04-03 DOA: 02/19/2023 DOS: the patient was seen and examined on 02/19/2023 PCP: Daisy Floro, MD  Patient coming from: Home  Chief Complaint:  Chief Complaint  Patient presents with   Fall   HPI: Hannah Fisher is a 72 y.o. female with medical history significant of Parkinson's disease, HTN, osteoporosis who presents with fall.   Pt lives alone. Appears to be unreliable historian and changes her answers frequently She is oriented to self, place but not time. Report falls in the past few days up to 4 times daily. Says she feels very lightheaded. Normally ambulates with walker. Had physical therapy but this ended a few weeks ago. Reports diarrhea. Denies low appetite.  Occasionally alcohol and no illicit drug use.   I later spoke with daughter over the phone. She does not feel that pt is safe to live alone and would like her to be placed in assisted or skilled nursing facility. Pt was previously in an assisted living facility about a year ago but was able to convince other family members to let her go back to living alone which the daughter does not agree with. Pt has been weaker the past few weeks especially in her legs. More confusion. Baseline she has walker and she uses it most of the time when family watches her on camera. Has not fallen in some time. Has been complaining of nausea but has not seen her vomited. Has been constipated. Has been dizziness going to laying to sitting but this is chronic. Daughter is not sure if she has been taking her medication or eating since her memory has worsen. Family has tried to help her set out her medication but pt a lot of times wants to still management them herself.    In the ED, she was afebrile, normotensive on room air.   No leukocytosis or anemia.   Has hypokalemia of 2.4. creatinine of 0.78. CBG of 92.  CK within normal limits.   CT head and C-spine  unremarkable.   Pt was given oral and IV potassium in ED and hospitalist consulted for admission for weakness and recurrent falls.    Review of Systems: As mentioned in the history of present illness. All other systems reviewed and are negative. Past Medical History:  Diagnosis Date   Arthritis    hands, knees   Deafness    left ear sue to shingles   Hypertension    Kidney stones    Osteoporosis 2009   -2.6   Shingles    Past Surgical History:  Procedure Laterality Date   MASS EXCISION Right 05/30/2018   Procedure: EXCISION MASS RIGHT SMALL FINGER;  Surgeon: Cindee Salt, MD;  Location: [facility];  Service: Orthopedics;  Laterality: Right;   TONSILLECTOMY     VAGINAL HYSTERECTOMY  1992   Social History:  reports that she has never smoked. She has never used smokeless tobacco. She reports current alcohol use. She reports that she does not use drugs.  Allergies  Allergen Reactions   Prednisone Shortness Of Breath, Swelling and Other (See Comments)    Tightness in chest, trouble breathing, edema, and chest pressure   Beta Adrenergic Blockers Nausea Only and Other (See Comments)    Made the patient feel badly   Penicillins Hives, Swelling and Other (See Comments)    Edema    Sulfa Antibiotics Rash    Family History  Problem Relation Age of  Onset   Hypertension Mother    Stroke Mother    Hypertension Sister    Hypertension Brother    Breast cancer Neg Hx     Prior to Admission medications   Medication Sig Start Date End Date Taking? Authorizing Provider  carbidopa-levodopa (SINEMET IR) 25-100 MG tablet Take by mouth. 09/18/22  Yes [provider]  acetaminophen (TYLENOL) 650 MG CR tablet Take 650 mg by mouth in the morning and at bedtime.    [provider]  ALPRAZolam Prudy Feeler) 0.25 MG tablet Take 1 tablet (0.25 mg total) by mouth 2 (two) times daily as needed for anxiety. 01/04/21   Erick Blinks, MD  amLODipine (NORVASC) 2.5 MG tablet  Take 2.5 mg by mouth daily. 09/28/21   [provider]  aspirin 81 MG EC tablet Take 81 mg by mouth daily.    [provider]  ciclopirox (PENLAC) 8 % solution Apply topically at bedtime. Apply over nail and surrounding skin. Apply daily over previous coat. After seven (7) days, may remove with alcohol and continue cycle. Patient not taking: Reported on 09/23/2021 03/24/21   Vivi Barrack, DPM  cyclobenzaprine (FLEXERIL) 5 MG tablet Take 1 tablet (5 mg total) by mouth 3 (three) times daily as needed for muscle spasms. Patient not taking: Reported on 09/23/2021 01/04/21   Erick Blinks, MD  feeding supplement (ENSURE ENLIVE / ENSURE PLUS) LIQD Take 237 mLs by mouth 2 (two) times daily between meals. Patient not taking: Reported on 09/23/2021 01/04/21   Erick Blinks, MD  ketoconazole (NIZORAL) 2 % cream APPLY 1 APPLICATION TO THE AFFECTED AREA(S) DAILY Patient not taking: Reported on 09/23/2021 03/30/21   Vivi Barrack, DPM  levETIRAcetam (KEPPRA) 500 MG tablet Take 1 tablet (500 mg total) by mouth 2 (two) times daily. Patient not taking: Reported on 09/23/2021 01/04/21   Erick Blinks, MD  lisinopril (ZESTRIL) 2.5 MG tablet Take 2.5 mg by mouth daily.    [provider]  naproxen sodium (ALEVE) 220 MG tablet Take 220-440 mg by mouth See admin instructions. 220 mg in the morning 440 mg at bedtime    [provider]  polyethylene glycol (MIRALAX / GLYCOLAX) 17 g packet Take 17 g by mouth daily. Patient not taking: Reported on 09/23/2021 01/05/21   Erick Blinks, MD  rOPINIRole (REQUIP) 0.25 MG tablet Take 0.125 mg by mouth at bedtime.    [provider]    Physical Exam: Vitals:   02/19/23 1659 02/19/23 1915 02/19/23 2117  BP: (!) 127/98 (!) 162/103 (!) 169/106  Pulse: 96 88 78  Resp: 16  15  Temp: 97.8 F (36.6 C)  97.6 F (36.4 C)  TempSrc: Oral    SpO2: 100% 98% 100%   Constitutional: NAD, calm, comfortable, elderly female laying  in bed Eyes: PERRL, lids and conjunctivae normal ENMT: Mucous membranes are moist.  Neck: normal, supple Respiratory: clear to auscultation bilaterally, no wheezing, no crackles. Normal respiratory effort. No accessory muscle use.  Cardiovascular: Regular rate and rhythm, no murmurs / rubs / gallops. No extremity edema.   Abdomen: no tenderness, soft, non-distended. Bowel sounds positive.  Musculoskeletal: no clubbing / cyanosis. No joint deformity upper and lower extremities.  Skin: no rashes, lesions, ulcers. No induration Neurologic: CN 2-12 grossly intact.Alert and oriented only to self, place but not time. Strength 5/5 in all 4.  Psychiatric: Has cognitive impairment. Alert and oriented x 3. Normal mood.   Data Reviewed:  See HPI  Assessment and Plan: * Hypokalemia -  daughter suspect pt has not been eating well the past few weeks -has been administered oral and IV potassium  -follow repeat in the morning  Weakness -suspect due to decrease oral intake, hypokalemia in the setting of Parkinson's disease -PT evaluation -daughter ultimately wants her place back into ALF or SNF  HTN (hypertension) -continue lisinopril  Parkinson's disease -follows with [facility] neurology -continue carbidopa-levodopa -continue requip      Advance Care Planning: Full  Consults: none  Family Communication: daughter Hannah Fisher   Severity of Illness: The appropriate patient status for this patient is OBSERVATION. Observation status is judged to be reasonable and necessary in order to provide the required intensity of service to ensure the patient's safety. The patient's presenting symptoms, physical exam findings, and initial radiographic and laboratory data in the context of their medical condition is felt to place them at decreased risk for further clinical deterioration. Furthermore, it is anticipated that the patient will be medically stable for discharge from the hospital within 2  midnights of admission.   Author: Anselm Jungling, DO 02/19/2023 9:17 PM  For on call review [email or web address].

## 2023-02-19 NOTE — Assessment & Plan Note (Signed)
-   continue lisinopril

## 2023-02-19 NOTE — ED Provider Triage Note (Signed)
Emergency Medicine Provider Triage Evaluation Note  Hannah Fisher , a 72 y.o. female  was evaluated in triage.  Pt complains of falls.  Patient has been having multiple falls over the past few days that are unwitnessed.  Daughter is here to assist in history and states that patient has been on the ground after the falls for possibly hours at a time that she lives alone.  Daughter states patient has history of dementia and Parkinson's and has been endorsing night sweats to her.  Patient does state that she gets short of breath with a feeling of warmth before she falls.  Patient is unsure if she is hit her head or neck but does endorse pain in those areas.  Patient denies any fevers, chest pain, cough, recent illness, dysuria, medication changes, blood thinners  Review of Systems  Positive: See HPI Negative: HPI  Physical Exam  BP (!) 127/98 (BP Location: Left Arm)   Pulse 96   Temp 97.8 F (36.6 C) (Oral)   Resp 16   LMP 10/02/1990   SpO2 100%  Gen:   Awake, no distress   Resp:  Normal effort  MSK:   Moves extremities without difficulty  Other:    Medical Decision Making  Medically screening exam initiated at 5:03 PM.  Appropriate orders placed.  CHERI CAINS was informed that the remainder of the evaluation will be completed by another provider, this initial triage assessment does not replace that evaluation, and the importance of remaining in the ED until their evaluation is complete.  Workup initiated, patient stable this time   Remi Deter 02/19/23 1705

## 2023-02-19 NOTE — Assessment & Plan Note (Addendum)
-  follows with [facility] neurology -continue carbidopa-levodopa -continue requip

## 2023-02-19 NOTE — ED Notes (Signed)
Patient is given her evening dose of Sinemet by her daughter with provider's permission.  Pharmacy notified

## 2023-02-19 NOTE — Assessment & Plan Note (Signed)
-  daughter suspect pt has not been eating well the past few weeks -has been administered oral and IV potassium  -follow repeat in the morning

## 2023-02-19 NOTE — ED Notes (Signed)
Pt right pupil is significantly larger than left. Pupils are responsive to light

## 2023-02-19 NOTE — ED Triage Notes (Signed)
Patient c/o multiple falls yesterday. Pt report she was been in the floor for few hours and eventually got up by herself. Pt report hit her head in the floor. Pt report head and neck pain. Pt denies LOC. Pt denies taking blood thinners. Pt a/ox4.

## 2023-02-19 NOTE — ED Provider Notes (Signed)
[place] EMERGENCY DEPARTMENT AT [facility] Provider Note   CSN: [id number] Arrival date & time: 02/19/23  1627     History  Chief Complaint  Patient presents with   Hannah Fisher    [name] is a 72 y.o. female.  Patient has history of Parkinson disease.  She fell recently and was unable to get up  The history is provided by the patient and medical records. No language interpreter was used.  Fall This is a new problem. The current episode started 12 to 24 hours ago. The problem occurs constantly. The problem has not changed since onset.Pertinent negatives include no chest pain, no abdominal pain and no headaches. Nothing aggravates the symptoms. Nothing relieves the symptoms. She has tried nothing for the symptoms.       Home Medications Prior to Admission medications   Medication Sig Start Date End Date Taking? Authorizing Provider  carbidopa-levodopa (SINEMET IR) 25-100 MG tablet Take by mouth. 09/18/22  Yes [provider]  acetaminophen (TYLENOL) 650 MG CR tablet Take 650 mg by mouth in the morning and at bedtime.    [provider]  ALPRAZolam Prudy Feeler) 0.25 MG tablet Take 1 tablet (0.25 mg total) by mouth 2 (two) times daily as needed for anxiety. 01/04/21   Erick Blinks, MD  amLODipine (NORVASC) 2.5 MG tablet Take 2.5 mg by mouth daily. 09/28/21   [provider]  aspirin 81 MG EC tablet Take 81 mg by mouth daily.    [provider]  ciclopirox (PENLAC) 8 % solution Apply topically at bedtime. Apply over nail and surrounding skin. Apply daily over previous coat. After seven (7) days, may remove with alcohol and continue cycle. Patient not taking: Reported on 09/23/2021 03/24/21   Vivi Barrack, DPM  cyclobenzaprine (FLEXERIL) 5 MG tablet Take 1 tablet (5 mg total) by mouth 3 (three) times daily as needed for muscle spasms. Patient not taking: Reported on 09/23/2021 01/04/21   Erick Blinks, MD  feeding supplement  (ENSURE ENLIVE / ENSURE PLUS) LIQD Take 237 mLs by mouth 2 (two) times daily between meals. Patient not taking: Reported on 09/23/2021 01/04/21   Erick Blinks, MD  ketoconazole (NIZORAL) 2 % cream APPLY 1 APPLICATION TO THE AFFECTED AREA(S) DAILY Patient not taking: Reported on 09/23/2021 03/30/21   Vivi Barrack, DPM  levETIRAcetam (KEPPRA) 500 MG tablet Take 1 tablet (500 mg total) by mouth 2 (two) times daily. Patient not taking: Reported on 09/23/2021 01/04/21   Erick Blinks, MD  lisinopril (ZESTRIL) 2.5 MG tablet Take 2.5 mg by mouth daily.    [provider]  naproxen sodium (ALEVE) 220 MG tablet Take 220-440 mg by mouth See admin instructions. 220 mg in the morning 440 mg at bedtime    [provider]  polyethylene glycol (MIRALAX / GLYCOLAX) 17 g packet Take 17 g by mouth daily. Patient not taking: Reported on 09/23/2021 01/05/21   Erick Blinks, MD  rOPINIRole (REQUIP) 0.25 MG tablet Take 0.125 mg by mouth at bedtime.    [provider]      Allergies    Beta adrenergic blockers, Penicillin v, Penicillins, and Sulfa antibiotics    Review of Systems   Review of Systems  Constitutional:  Positive for fatigue. Negative for appetite change.  HENT:  Negative for congestion, ear discharge and sinus pressure.   Eyes:  Negative for discharge.  Respiratory:  Negative for cough.   Cardiovascular:  Negative for chest pain.  Gastrointestinal:  Negative for  abdominal pain and diarrhea.  Genitourinary:  Negative for frequency and hematuria.  Musculoskeletal:  Negative for back pain.  Skin:  Negative for rash.  Neurological:  Negative for seizures and headaches.  Psychiatric/Behavioral:  Negative for hallucinations.     Physical Exam Updated Vital Signs BP (!) 127/98 (BP Location: Left Arm)   Pulse 96   Temp 97.8 F (36.6 C) (Oral)   Resp 16   LMP 10/02/1990   SpO2 100%  Physical Exam Vitals and nursing note reviewed.  Constitutional:       Appearance: She is well-developed.  HENT:     Head: Normocephalic.     Mouth/Throat:     Mouth: Mucous membranes are moist.  Eyes:     General: No scleral icterus.    Conjunctiva/sclera: Conjunctivae normal.  Neck:     Thyroid: No thyromegaly.  Cardiovascular:     Rate and Rhythm: Normal rate and regular rhythm.     Heart sounds: No murmur heard.    No friction rub. No gallop.  Pulmonary:     Breath sounds: No stridor. No wheezing or rales.  Chest:     Chest wall: No tenderness.  Abdominal:     General: There is no distension.     Tenderness: There is no abdominal tenderness. There is no rebound.  Musculoskeletal:        General: Normal range of motion.     Cervical back: Neck supple.  Lymphadenopathy:     Cervical: No cervical adenopathy.  Skin:    Findings: No erythema or rash.  Neurological:     Mental Status: She is oriented to person, place, and time.     Motor: No abnormal muscle tone.     Coordination: Coordination normal.  Psychiatric:        Behavior: Behavior normal.     ED Results / Procedures / Treatments   Labs (all labs ordered are listed, but only abnormal results are displayed) Labs Reviewed  BASIC METABOLIC PANEL - Abnormal; Notable for the following components:      Result Value   Potassium 2.4 (*)    All other components within normal limits  CBC WITH DIFFERENTIAL/PLATELET  CK  URINALYSIS, ROUTINE W REFLEX MICROSCOPIC  TROPONIN I (HIGH SENSITIVITY)  TROPONIN I (HIGH SENSITIVITY)    EKG EKG Interpretation  Date/Time:  Monday Feb 19 2023 17:46:43 EDT Ventricular Rate:  101 PR Interval:  120 QRS Duration: 81 QT Interval:  337 QTC Calculation: 437 R Axis:   78 Text Interpretation: Sinus tachycardia Probable left atrial enlargement Borderline repolarization abnormality Confirmed by Bethann Berkshire [phone]) on 02/19/2023 7:21:01 PM  Radiology DG Chest 2 View  Result Date: 02/19/2023 CLINICAL DATA:  Multiple falls. EXAM: CHEST - 2 VIEW  COMPARISON:  September 23, 2021. FINDINGS: The heart size and mediastinal contours are within normal limits. Both lungs are clear. The visualized skeletal structures are unremarkable. IMPRESSION: No active cardiopulmonary disease. Electronically Signed   By: Lupita Raider M.D.   On: 02/19/2023 18:26   CT Head Wo Contrast  Result Date: 02/19/2023 CLINICAL DATA:  Multiple falls over the last few days that are unwitnessed. History of dementia and Parkinson's disease EXAM: CT HEAD WITHOUT CONTRAST CT CERVICAL SPINE WITHOUT CONTRAST TECHNIQUE: Multidetector CT imaging of the head and cervical spine was performed following the standard protocol without intravenous contrast. Multiplanar CT image reconstructions of the cervical spine were also generated. RADIATION DOSE REDUCTION: This exam was performed according to the departmental dose-optimization program which  includes automated exposure control, adjustment of the mA and/or kV according to patient size and/or use of iterative reconstruction technique. COMPARISON:  CT head 08/30/2021 and CT cervical spine 08/30/2021 FINDINGS: CT HEAD FINDINGS Brain: No intracranial hemorrhage, mass effect, or evidence of acute infarct. No hydrocephalus. No extra-axial fluid collection. Generalized cerebral atrophy. Ill-defined hypoattenuation within the cerebral white matter is nonspecific but consistent with chronic small vessel ischemic disease. Chronic bilateral lacunar infarcts in the basal ganglia. Vascular: No hyperdense vessel. Intracranial arterial calcification. Skull: No fracture or focal lesion. Sinuses/Orbits: No acute finding. Paranasal sinuses and mastoid air cells are well aerated. Other: None. CT CERVICAL SPINE FINDINGS Alignment: No evidence of traumatic malalignment. Skull base and vertebrae: No acute fracture. No primary bone lesion or focal pathologic process. Soft tissues and spinal canal: No prevertebral fluid or swelling. No visible canal hematoma. Disc  levels: Multilevel spondylosis, disc space height loss, and degenerative endplate changes greatest at C4-C5 and C5-C6 where it is moderate. No severe spinal canal narrowing. Upper chest: Biapical pleural-parenchymal scarring greater on the right. No acute abnormality. Other: None. IMPRESSION: 1. No acute intracranial abnormality. Generalized atrophy and small vessel white matter disease. 2. No acute fracture in the cervical spine. Multilevel degenerative spondylosis. Electronically Signed   By: Minerva Fester M.D.   On: 02/19/2023 18:15   CT Cervical Spine Wo Contrast  Result Date: 02/19/2023 CLINICAL DATA:  Multiple falls over the last few days that are unwitnessed. History of dementia and Parkinson's disease EXAM: CT HEAD WITHOUT CONTRAST CT CERVICAL SPINE WITHOUT CONTRAST TECHNIQUE: Multidetector CT imaging of the head and cervical spine was performed following the standard protocol without intravenous contrast. Multiplanar CT image reconstructions of the cervical spine were also generated. RADIATION DOSE REDUCTION: This exam was performed according to the departmental dose-optimization program which includes automated exposure control, adjustment of the mA and/or kV according to patient size and/or use of iterative reconstruction technique. COMPARISON:  CT head 08/30/2021 and CT cervical spine 08/30/2021 FINDINGS: CT HEAD FINDINGS Brain: No intracranial hemorrhage, mass effect, or evidence of acute infarct. No hydrocephalus. No extra-axial fluid collection. Generalized cerebral atrophy. Ill-defined hypoattenuation within the cerebral white matter is nonspecific but consistent with chronic small vessel ischemic disease. Chronic bilateral lacunar infarcts in the basal ganglia. Vascular: No hyperdense vessel. Intracranial arterial calcification. Skull: No fracture or focal lesion. Sinuses/Orbits: No acute finding. Paranasal sinuses and mastoid air cells are well aerated. Other: None. CT CERVICAL SPINE FINDINGS  Alignment: No evidence of traumatic malalignment. Skull base and vertebrae: No acute fracture. No primary bone lesion or focal pathologic process. Soft tissues and spinal canal: No prevertebral fluid or swelling. No visible canal hematoma. Disc levels: Multilevel spondylosis, disc space height loss, and degenerative endplate changes greatest at C4-C5 and C5-C6 where it is moderate. No severe spinal canal narrowing. Upper chest: Biapical pleural-parenchymal scarring greater on the right. No acute abnormality. Other: None. IMPRESSION: 1. No acute intracranial abnormality. Generalized atrophy and small vessel white matter disease. 2. No acute fracture in the cervical spine. Multilevel degenerative spondylosis. Electronically Signed   By: Minerva Fester M.D.   On: 02/19/2023 18:15    Procedures Procedures   Medications Ordered in ED Medications  potassium chloride 10 mEq in 100 mL IVPB (has no administration in time range)  potassium chloride 10 mEq in 100 mL IVPB (has no administration in time range)  potassium chloride SA (KLOR-CON M) CR tablet 40 mEq (has no administration in time range)    ED Course/ Medical  Decision Making/ A&P   {  CRITICAL CARE Performed by: Bethann Berkshire Total critical care time: 35 minutes Critical care time was exclusive of separately billable procedures and treating other patients. Critical care was necessary to treat or prevent imminent or life-threatening deterioration. Critical care was time spent personally by me on the following activities: development of treatment plan with patient and/or surrogate as well as nursing, discussions with consultants, evaluation of patient's response to treatment, examination of patient, obtaining history from patient or surrogate, ordering and performing treatments and interventions, ordering and review of laboratory studies, ordering and review of radiographic studies, pulse oximetry and re-evaluation of patient's condition.  This  patient presents to the ED for concern of fatigue, this involves an extensive number of treatment options, and is a complaint that carries with it a high risk of complications and morbidity.  The differential diagnosis includes worsening Parkinson disease, infection   Co morbidities that complicate the patient evaluation  Parkinson disease   Additional history obtained:  Additional history obtained from family External records from outside source obtained and reviewed including all records   Lab Tests:  I Ordered, and personally interpreted labs.  The pertinent results include: Potassium 2.4   Imaging Studies ordered:  I ordered imaging studies including chest x-ray, CT head and cervical spine I independently visualized and interpreted imaging which showed unremarkable I agree with the radiologist interpretation   Cardiac Monitoring: / EKG:  The patient was maintained on a cardiac monitor.  I personally viewed and interpreted the cardiac monitored which showed an underlying rhythm of: Normal sinus rhythm   Consultations Obtained:  I requested consultation with the hospitalist,  and discussed lab and imaging findings as well as pertinent plan - they recommend: Admit   Problem List / ED Course / Critical interventions / Medication management  Parkinson's disease and hypokalemia I ordered medication including potassium IV for hypokalemia Reevaluation of the patient after these medicines showed that the patient improved I have reviewed the patients home medicines and have made adjustments as needed   Social Determinants of Health:  None   Test / Admission - Considered:  None  Patient with worsening weakness from Parkinson disease and hypokalemia.  She will be admitted to medicine   Final Clinical Impression(s) / ED Diagnoses Final diagnoses:  Fall, initial encounter  Hypokalemia    Rx / DC Orders ED Discharge Orders     None         Bethann Berkshire,  MD 02/20/23 1752

## 2023-02-19 NOTE — ED Notes (Signed)
ED TO INPATIENT HANDOFF REPORT  ED Nurse Name and Phone #: Mohamad Bruso  S Name/Age/Gender Hannah Fisher 72 y.o. female Room/Bed: WA08/WA08  Code Status   Code Status: Prior  Home/SNF/Other Home Patient oriented to: self and situation Is this baseline? Yes   Triage Complete: Triage complete  Chief Complaint Hypokalemia [E87.6]  Triage Note Patient c/o multiple falls yesterday. Pt report she was been in the floor for few hours and eventually got up by herself. Pt report hit her head in the floor. Pt report head and neck pain. Pt denies LOC. Pt denies taking blood thinners. Pt a/ox4.   Allergies Allergies  Allergen Reactions   Beta Adrenergic Blockers Nausea Only and Other (See Comments)    Made the patient feel badly   Penicillin V Swelling    Other Reaction(s): edema   Penicillins Hives and Swelling   Prednisone Swelling    Tightness in chest, trouble breathing chest pressure  Other Reaction(s): edema   Sulfa Antibiotics Rash    Level of Care/Admitting Diagnosis ED Disposition     ED Disposition  Admit   Condition  --   Comment  Hospital Area: [facility] [[id number]]  Level of Care: Med-Surg [16]  May place patient in observation at [facility] or [facility] if equivalent level of care is available:: No  Covid Evaluation: Asymptomatic - no recent exposure (last 10 days) testing not required  Diagnosis: Hypokalemia [172180]  Admitting Physician: Anselm Jungling [[id number]]  Attending Physician: Anselm Jungling [[id number]]          B Medical/Surgery History Past Medical History:  Diagnosis Date   Arthritis    hands, knees   Deafness    left ear sue to shingles   Hypertension    Kidney stones    Osteoporosis 2009   -2.6   Shingles    Past Surgical History:  Procedure Laterality Date   MASS EXCISION Right 05/30/2018   Procedure: EXCISION MASS RIGHT SMALL FINGER;  Surgeon: Cindee Salt, MD;  Location: [facility];  Service:  Orthopedics;  Laterality: Right;   TONSILLECTOMY     VAGINAL HYSTERECTOMY  1992     A IV Location/Drains/Wounds Patient Lines/Drains/Airways Status     Active Line/Drains/Airways     Name Placement date Placement time Site Days   Peripheral IV 02/19/23 20 G Right Antecubital 02/19/23  1927  Antecubital  less than 1   Incision (Closed) 05/30/18 Hand Right 05/30/18  0851  -- 1726            Intake/Output Last 24 hours No intake or output data in the 24 hours ending 02/19/23 2048  Labs/Imaging Results for orders placed or performed during the hospital encounter of 02/19/23 (from the past 48 hour(s))  Basic metabolic panel     Status: Abnormal   Collection Time: 02/19/23  5:32 PM  Result Value Ref Range   Sodium 142 135 - 145 mmol/L   Potassium 2.4 (LL) 3.5 - 5.1 mmol/L    Comment: CRITICAL RESULT CALLED TO, READ BACK BY AND VERIFIED WITH RN S SHAW AT 1904 02/19/23 CRUICKSHANK A    Chloride 106 98 - 111 mmol/L   CO2 26 22 - 32 mmol/L   Glucose, Bld 92 70 - 99 mg/dL    Comment: Glucose reference range applies only to samples taken after fasting for at least 8 hours.   BUN 19 8 - 23 mg/dL   Creatinine, Ser 1.91 0.44 - 1.00 mg/dL  Calcium 9.5 8.9 - 10.3 mg/dL   GFR, Estimated >16 >10 mL/min    Comment: (NOTE) Calculated using the CKD-EPI Creatinine Equation (2021)    Anion gap 10 5 - 15    Comment: Performed at [facility], [address], [place], [place] [address]  Troponin I (High Sensitivity)     Status: None   Collection Time: 02/19/23  5:32 PM  Result Value Ref Range   Troponin I (High Sensitivity) 3 <18 ng/L    Comment: (NOTE) Elevated high sensitivity troponin I (hsTnI) values and significant  changes across serial measurements may suggest ACS but many other  chronic and acute conditions are known to elevate hsTnI results.  Refer to the "Links" section for chest pain algorithms and additional  guidance. Performed at [facility], [address] [place], [place] [address]   CBC with Differential     Status: None   Collection Time: 02/19/23  5:32 PM  Result Value Ref Range   WBC 6.8 4.0 - 10.5 K/uL   RBC 4.27 3.87 - 5.11 MIL/uL   Hemoglobin 13.2 12.0 - 15.0 g/dL   HCT 19.1 47.8 - 29.5 %   MCV 95.6 80.0 - 100.0 fL   MCH 30.9 26.0 - 34.0 pg   MCHC 32.4 30.0 - 36.0 g/dL   RDW 62.1 30.8 - 65.7 %   Platelets 232 150 - 400 K/uL   nRBC 0.0 0.0 - 0.2 %   Neutrophils Relative % 57 %   Neutro Abs 3.8 1.7 - 7.7 K/uL   Lymphocytes Relative 33 %   Lymphs Abs 2.3 0.7 - 4.0 K/uL   Monocytes Relative 9 %   Monocytes Absolute 0.6 0.1 - 1.0 K/uL   Eosinophils Relative 1 %   Eosinophils Absolute 0.1 0.0 - 0.5 K/uL   Basophils Relative 0 %   Basophils Absolute 0.0 0.0 - 0.1 K/uL   Immature Granulocytes 0 %   Abs Immature Granulocytes 0.02 0.00 - 0.07 K/uL    Comment: Performed at [facility], [address], [place], [place] [address]  CK     Status: None   Collection Time: 02/19/23  5:32 PM  Result Value Ref Range   Total CK 60 38 - 234 U/L    Comment: Performed at [facility], [address] [place], [place] [address]  Troponin I (High Sensitivity)     Status: None   Collection Time: 02/19/23  7:41 PM  Result Value Ref Range   Troponin I (High Sensitivity) 3 <18 ng/L    Comment: (NOTE) Elevated high sensitivity troponin I (hsTnI) values and significant  changes across serial measurements may suggest ACS but many other  chronic and acute conditions are known to elevate hsTnI results.  Refer to the "Links" section for chest pain algorithms and additional  guidance. Performed at [facility], [address] [place], [place] [address]    DG Chest 2 View  Result Date: 02/19/2023 CLINICAL DATA:  Multiple falls. EXAM: CHEST - 2 VIEW COMPARISON:  September 23, 2021. FINDINGS: The heart size and mediastinal contours are within normal limits. Both  lungs are clear. The visualized skeletal structures are unremarkable. IMPRESSION: No active cardiopulmonary disease. Electronically Signed   By: Lupita Raider M.D.   On: 02/19/2023 18:26   CT Head Wo Contrast  Result Date: 02/19/2023 CLINICAL DATA:  Multiple falls over the last few days that are unwitnessed. History of dementia and Parkinson's  disease EXAM: CT HEAD WITHOUT CONTRAST CT CERVICAL SPINE WITHOUT CONTRAST TECHNIQUE: Multidetector CT imaging of the head and cervical spine was performed following the standard protocol without intravenous contrast. Multiplanar CT image reconstructions of the cervical spine were also generated. RADIATION DOSE REDUCTION: This exam was performed according to the departmental dose-optimization program which includes automated exposure control, adjustment of the mA and/or kV according to patient size and/or use of iterative reconstruction technique. COMPARISON:  CT head 08/30/2021 and CT cervical spine 08/30/2021 FINDINGS: CT HEAD FINDINGS Brain: No intracranial hemorrhage, mass effect, or evidence of acute infarct. No hydrocephalus. No extra-axial fluid collection. Generalized cerebral atrophy. Ill-defined hypoattenuation within the cerebral white matter is nonspecific but consistent with chronic small vessel ischemic disease. Chronic bilateral lacunar infarcts in the basal ganglia. Vascular: No hyperdense vessel. Intracranial arterial calcification. Skull: No fracture or focal lesion. Sinuses/Orbits: No acute finding. Paranasal sinuses and mastoid air cells are well aerated. Other: None. CT CERVICAL SPINE FINDINGS Alignment: No evidence of traumatic malalignment. Skull base and vertebrae: No acute fracture. No primary bone lesion or focal pathologic process. Soft tissues and spinal canal: No prevertebral fluid or swelling. No visible canal hematoma. Disc levels: Multilevel spondylosis, disc space height loss, and degenerative endplate changes greatest at C4-C5 and C5-C6  where it is moderate. No severe spinal canal narrowing. Upper chest: Biapical pleural-parenchymal scarring greater on the right. No acute abnormality. Other: None. IMPRESSION: 1. No acute intracranial abnormality. Generalized atrophy and small vessel white matter disease. 2. No acute fracture in the cervical spine. Multilevel degenerative spondylosis. Electronically Signed   By: Minerva Fester M.D.   On: 02/19/2023 18:15   CT Cervical Spine Wo Contrast  Result Date: 02/19/2023 CLINICAL DATA:  Multiple falls over the last few days that are unwitnessed. History of dementia and Parkinson's disease EXAM: CT HEAD WITHOUT CONTRAST CT CERVICAL SPINE WITHOUT CONTRAST TECHNIQUE: Multidetector CT imaging of the head and cervical spine was performed following the standard protocol without intravenous contrast. Multiplanar CT image reconstructions of the cervical spine were also generated. RADIATION DOSE REDUCTION: This exam was performed according to the departmental dose-optimization program which includes automated exposure control, adjustment of the mA and/or kV according to patient size and/or use of iterative reconstruction technique. COMPARISON:  CT head 08/30/2021 and CT cervical spine 08/30/2021 FINDINGS: CT HEAD FINDINGS Brain: No intracranial hemorrhage, mass effect, or evidence of acute infarct. No hydrocephalus. No extra-axial fluid collection. Generalized cerebral atrophy. Ill-defined hypoattenuation within the cerebral white matter is nonspecific but consistent with chronic small vessel ischemic disease. Chronic bilateral lacunar infarcts in the basal ganglia. Vascular: No hyperdense vessel. Intracranial arterial calcification. Skull: No fracture or focal lesion. Sinuses/Orbits: No acute finding. Paranasal sinuses and mastoid air cells are well aerated. Other: None. CT CERVICAL SPINE FINDINGS Alignment: No evidence of traumatic malalignment. Skull base and vertebrae: No acute fracture. No primary bone lesion  or focal pathologic process. Soft tissues and spinal canal: No prevertebral fluid or swelling. No visible canal hematoma. Disc levels: Multilevel spondylosis, disc space height loss, and degenerative endplate changes greatest at C4-C5 and C5-C6 where it is moderate. No severe spinal canal narrowing. Upper chest: Biapical pleural-parenchymal scarring greater on the right. No acute abnormality. Other: None. IMPRESSION: 1. No acute intracranial abnormality. Generalized atrophy and small vessel white matter disease. 2. No acute fracture in the cervical spine. Multilevel degenerative spondylosis. Electronically Signed   By: Minerva Fester M.D.   On: 02/19/2023 18:15    Pending Labs Unresulted Labs (From admission,  onward)     Start     Ordered   02/19/23 1703  Urinalysis, Routine w reflex microscopic -Urine, Clean Catch  Once,   URGENT       Question:  Specimen Source  Answer:  Urine, Clean Catch   02/19/23 1703            Vitals/Pain Today's Vitals   02/19/23 1659 02/19/23 1700  BP: (!) 127/98   Pulse: 96   Resp: 16   Temp: 97.8 F (36.6 C)   TempSrc: Oral   SpO2: 100%   PainSc:  8     Isolation Precautions No active isolations  Medications Medications  potassium chloride 10 mEq in 100 mL IVPB (has no administration in time range)  potassium chloride 10 mEq in 100 mL IVPB (10 mEq Intravenous New Bag/Given 02/19/23 1956)  potassium chloride SA (KLOR-CON M) CR tablet 40 mEq (40 mEq Oral Given 02/19/23 1949)    Mobility walks with device     Focused Assessments    R Recommendations: See Admitting Provider Note  Report given to:   Additional Notes: patient is A+O to self and situation, her daughter is her caregiver

## 2023-02-20 DIAGNOSIS — E876 Hypokalemia: Secondary | ICD-10-CM | POA: Diagnosis not present

## 2023-02-20 LAB — BASIC METABOLIC PANEL
Anion gap: 6 (ref 5–15)
BUN: 16 mg/dL (ref 8–23)
CO2: 25 mmol/L (ref 22–32)
Calcium: 8.7 mg/dL — ABNORMAL LOW (ref 8.9–10.3)
Chloride: 108 mmol/L (ref 98–111)
Creatinine, Ser: 0.58 mg/dL (ref 0.44–1.00)
GFR, Estimated: >60 mL/min (ref >60)
Glucose, Bld: 95 mg/dL (ref 70–99)
Potassium: 3.8 mmol/L (ref 3.5–5.1)
Sodium: 139 mmol/L (ref 135–145)

## 2023-02-20 MED ORDER — THIAMINE HCL 100 MG/ML IJ SOLN: 250.0000 mg | Freq: Every day | INTRAVENOUS | Status: DC

## 2023-02-20 MED ORDER — THIAMINE HCL 100 MG/ML IJ SOLN
500.0000 mg | Freq: Three times a day (TID) | INTRAVENOUS | Status: DC
  Administered 2023-02-20 – 2023-02-22 (×5): 500 mg via INTRAVENOUS
  Filled 2023-02-20 (×9): qty 5

## 2023-02-20 MED ORDER — THIAMINE MONONITRATE 100 MG PO TABS: 100.0000 mg | ORAL_TABLET | Freq: Every day | ORAL | Status: DC

## 2023-02-20 MED ORDER — THIAMINE HCL 100 MG/ML IJ SOLN: 100.0000 mg | Freq: Every day | INTRAMUSCULAR | Status: DC

## 2023-02-20 NOTE — TOC Initial Note (Signed)
Transition of Care [facility]) - Initial/Assessment Note    Patient Details  Name: Hannah Fisher MRN: [id number] Date of Birth: 03/20/1951  Transition of Care [facility] CM/SW Contact:    Coralyn Helling, LCSW Phone Number: 02/20/2023, 11:52 AM  Clinical Narrative:   [facility] consulted for medication assistance. Patient has insurance and is not approriate for medication assistance. Per daughter Hannah Fisher, patient is able to get medications without issues. She reports patient has general financial concerns being that she is retired.  TOC attempted to assess patient at bedside. Patient working with mobility specialist. TOC spoke with patient daughter Hannah Fisher by phone. She reports that patient lives alone and needs assistance with ambulation. She reports patient uses walker most of the time and has a wheelchair for " bad days" Patient was previously at and ALF 2 years ago and daughter hopes to get her in another one. Patient went to SNF about a year ago following a hospitalization. Hannah Fisher hopes that patient can go to SNF for rehab at dc to get stronger. PT eval pending.    PLAN: TBD            Expected Discharge Plan: Skilled Nursing Facility Barriers to Discharge: Continued Medical Work up   Patient Goals and CMS Choice            Expected Discharge Plan and Services   Discharge Planning Services: NA   Living arrangements for the past 2 months: Single Family Home                                      Prior Living Arrangements/Services Living arrangements for the past 2 months: Single Family Home Lives with:: Self Patient language and need for interpreter reviewed:: Yes        Need for Family Participation in Patient Care: Yes (Comment) Care giver support system in place?: Yes (comment) Current home services: DME Criminal Activity/Legal Involvement Pertinent to Current Situation/Hospitalization: No - Comment as needed  Activities of Daily Living Home Assistive Devices/Equipment: Shower  chair with back, Environmental consultant (specify type) ADL Screening (condition at time of admission) Patient's cognitive ability adequate to safely complete daily activities?: Yes Is the patient deaf or have difficulty hearing?: No Does the patient have difficulty seeing, even when wearing glasses/contacts?: No Does the patient have difficulty concentrating, remembering, or making decisions?: No Patient able to express need for assistance with ADLs?: Yes Does the patient have difficulty dressing or bathing?: Yes Independently performs ADLs?: No Communication: Independent Dressing (OT): Needs assistance Is this a change from baseline?: Pre-admission baseline Grooming: Needs assistance Is this a change from baseline?: Pre-admission baseline Feeding: Independent Is this a change from baseline?: Pre-admission baseline Bathing: Needs assistance Is this a change from baseline?: Pre-admission baseline Toileting: Needs assistance Is this a change from baseline?: Pre-admission baseline In/Out Bed: Needs assistance Is this a change from baseline?: Pre-admission baseline Walks in Home: Needs assistance Does the patient have difficulty walking or climbing stairs?: Yes Weakness of Legs: Both Weakness of Arms/Hands: None  Permission Sought/Granted Permission sought to share information with : Family Supports    Share Information with NAME: Hannah Fisher, daughter           Emotional Assessment Appearance:: Appears stated age Attitude/Demeanor/Rapport: Unable to Assess Affect (typically observed): Unable to Assess Orientation: : Oriented to Self, Oriented to Place Alcohol / Substance Use: Not Applicable Psych Involvement: No (comment)  Admission diagnosis:  Hypokalemia [  E87.6] Fall, initial encounter [W19.XXXA] Patient Active Problem List   Diagnosis Date Noted   Hypokalemia 02/19/2023   HTN (hypertension) 02/19/2023   Weakness 02/19/2023   Impairment of balance 08/23/2021   Parkinson's disease  01/28/2021   Abnormal gait 01/24/2021   Allergic rhinitis 01/24/2021   Allergic rhinitis due to animal (cat) (dog) hair and dander 01/24/2021   Anxiety 01/24/2021   Chronic constipation 01/24/2021   Difficulty sleeping 01/24/2021   Gastritis 01/24/2021   High cholesterol 01/24/2021   Protein calorie malnutrition (HCC) 01/24/2021   Rectal bleeding 01/24/2021   Recurrent falls 01/24/2021   Restless legs 01/24/2021   Sciatica 01/24/2021   Standard chest x-ray abnormal 01/24/2021   Vitamin D deficiency 01/24/2021   Acute encephalopathy 12/28/2020   Rhabdomyolysis 12/28/2020   Lightheadedness 06/22/2020   Other abnormalities of gait and mobility 05/03/2020   Lumbar radiculopathy 04/28/2020   Dysphonia 12/03/2019   Age-related osteoporosis without current pathological fracture 11/25/2019   Dysfunction of both eustachian tubes 05/19/2019   Presbycusis of both ears 05/19/2019   Unilateral deafness, left 04/07/2019   Mass 04/17/2018   Deafness    Shingles    Osteoporosis    Hypertension    PCP:  Daisy Floro, MD Pharmacy:   [facility] [id number] - [place], [place] - [address] [place] [address] Phone: [phone] Fax: [phone]     Social Determinants of Health (SDOH) Social History: SDOH Screenings   Food Insecurity: Food Insecurity Present (02/19/2023)  Housing: Low Risk  (02/19/2023)  Transportation Needs: No Transportation Needs (02/19/2023)  Utilities: At Risk (02/19/2023)  Tobacco Use: Low Risk  (02/19/2023)   SDOH Interventions:     Readmission Risk Interventions     No data to display

## 2023-02-20 NOTE — Progress Notes (Signed)
PROGRESS NOTE    Hannah Fisher  HQI:[id number] DOB: Apr 11, 1951 DOA: 02/19/2023 PCP: Daisy Floro, MD  Chief Complaint  Patient presents with   Fall    Brief Narrative:   Hannah Fisher is Hannah Fisher 72 y.o. female with medical history significant of Parkinson's disease, HTN, osteoporosis who presented with fall. Concern from family she needs SNF.   Assessment & Plan:   Principal Problem:   Hypokalemia Active Problems:   Parkinson's disease   HTN (hypertension)   Weakness  Hypokalemia -daughter suspect pt has not been eating well the past few weeks -has been administered oral and IV potassium  -improved, follow  Weakness  Fall  -suspect due to decrease oral intake, hypokalemia in the setting of Parkinson's disease -follow B12, folate, TSH -PT evaluation - negative orthostatics -daughter ultimately wants her place back into ALF or SNF -- unable to reach daughter today, therapy recommending ALF vs SNF -> will have to follow with TOC   HTN (hypertension) -continue lisinopril   Parkinson's disease -follows with [facility] neurology -continue carbidopa-levodopa -continue requip  Confusion - noted per daughter, maybe related to parkinsons - b12, folate, tsh - high dose thiamine given malnutrition - UA not particularly concerning for UTI  Severe Protein Calorie Malnutrition RD c/s Wt Readings from Last 3 Encounters:  02/19/23 37.7 kg  09/29/21 54.4 kg  12/28/20 43.1 kg  Body mass index is 15.7 kg/m.    DVT prophylaxis: lovenox Code Status: full Family Communication: none - called daughter [name], no answer Disposition:   Status is: Observation The patient remains OBS appropriate and will d/c before 2 midnights.   Consultants:  none  Procedures:  none  Antimicrobials:  Anti-infectives (From admission, onward)    None       Subjective: No new complaints  Objective: Vitals:   02/20/23 0105 02/20/23 0456 02/20/23 0929 02/20/23 1231  BP: (!)  140/90 (!) 151/94 (!) 157/86 114/77  Pulse: 66 75 80 79  Resp: 16 15 16 16   Temp: 97.6 F (36.4 C) 97.6 F (36.4 C) 97.9 F (36.6 C) 98.2 F (36.8 C)  TempSrc:   Oral Oral  SpO2: 99% 100% 100% 99%  Weight:      Height:        Intake/Output Summary (Last 24 hours) at 02/20/2023 1956 Last data filed at 02/20/2023 1700 Gross per 24 hour  Intake 458 ml  Output 300 ml  Net 158 ml   Filed Weights   02/19/23 2328  Weight: 37.7 kg    Examination:  General exam: Appears calm and comfortable - frail Respiratory system: Clear to auscultation. Respiratory effort normal. Cardiovascular system: RRR Gastrointestinal system: Abdomen is nondistended, soft and nontender. Central nervous system: Alert and oriented. No focal neurological deficits. Extremities: no LEE   Data Reviewed: I have personally reviewed following labs and imaging studies  CBC: Recent Labs  Lab 02/19/23 1732  WBC 6.8  NEUTROABS 3.8  HGB 13.2  HCT 40.8  MCV 95.6  PLT 232    Basic Metabolic Panel: Recent Labs  Lab 02/19/23 1732 02/20/23 0421  NA 142 139  K 2.4* 3.8  CL 106 108  CO2 26 25  GLUCOSE 92 95  BUN 19 16  CREATININE 0.78 0.58  CALCIUM 9.5 8.7*    GFR: Estimated Creatinine Clearance: 37.8 mL/min (by C-G formula based on SCr of 0.58 mg/dL).  Liver Function Tests: No results for input(s): "AST", "ALT", "ALKPHOS", "BILITOT", "PROT", "ALBUMIN" in the last 168 hours.  CBG: No results for input(s): "GLUCAP" in the last 168 hours.   No results found for this or any previous visit (from the past 240 hour(s)).       Radiology Studies: DG Chest 2 View  Result Date: 02/19/2023 CLINICAL DATA:  Multiple falls. EXAM: CHEST - 2 VIEW COMPARISON:  September 23, 2021. FINDINGS: The heart size and mediastinal contours are within normal limits. Both lungs are clear. The visualized skeletal structures are unremarkable. IMPRESSION: No active cardiopulmonary disease. Electronically Signed   By: Lupita Raider M.D.   On: 02/19/2023 18:26   CT Head Wo Contrast  Result Date: 02/19/2023 CLINICAL DATA:  Multiple falls over the last few days that are unwitnessed. History of dementia and Parkinson's disease EXAM: CT HEAD WITHOUT CONTRAST CT CERVICAL SPINE WITHOUT CONTRAST TECHNIQUE: Multidetector CT imaging of the head and cervical spine was performed following the standard protocol without intravenous contrast. Multiplanar CT image reconstructions of the cervical spine were also generated. RADIATION DOSE REDUCTION: This exam was performed according to the departmental dose-optimization program which includes automated exposure control, adjustment of the mA and/or kV according to patient size and/or use of iterative reconstruction technique. COMPARISON:  CT head 08/30/2021 and CT cervical spine 08/30/2021 FINDINGS: CT HEAD FINDINGS Brain: No intracranial hemorrhage, mass effect, or evidence of acute infarct. No hydrocephalus. No extra-axial fluid collection. Generalized cerebral atrophy. Ill-defined hypoattenuation within the cerebral white matter is nonspecific but consistent with chronic small vessel ischemic disease. Chronic bilateral lacunar infarcts in the basal ganglia. Vascular: No hyperdense vessel. Intracranial arterial calcification. Skull: No fracture or focal lesion. Sinuses/Orbits: No acute finding. Paranasal sinuses and mastoid air cells are well aerated. Other: None. CT CERVICAL SPINE FINDINGS Alignment: No evidence of traumatic malalignment. Skull base and vertebrae: No acute fracture. No primary bone lesion or focal pathologic process. Soft tissues and spinal canal: No prevertebral fluid or swelling. No visible canal hematoma. Disc levels: Multilevel spondylosis, disc space height loss, and degenerative endplate changes greatest at C4-C5 and C5-C6 where it is moderate. No severe spinal canal narrowing. Upper chest: Biapical pleural-parenchymal scarring greater on the right. No acute abnormality.  Other: None. IMPRESSION: 1. No acute intracranial abnormality. Generalized atrophy and small vessel white matter disease. 2. No acute fracture in the cervical spine. Multilevel degenerative spondylosis. Electronically Signed   By: Minerva Fester M.D.   On: 02/19/2023 18:15   CT Cervical Spine Wo Contrast  Result Date: 02/19/2023 CLINICAL DATA:  Multiple falls over the last few days that are unwitnessed. History of dementia and Parkinson's disease EXAM: CT HEAD WITHOUT CONTRAST CT CERVICAL SPINE WITHOUT CONTRAST TECHNIQUE: Multidetector CT imaging of the head and cervical spine was performed following the standard protocol without intravenous contrast. Multiplanar CT image reconstructions of the cervical spine were also generated. RADIATION DOSE REDUCTION: This exam was performed according to the departmental dose-optimization program which includes automated exposure control, adjustment of the mA and/or kV according to patient size and/or use of iterative reconstruction technique. COMPARISON:  CT head 08/30/2021 and CT cervical spine 08/30/2021 FINDINGS: CT HEAD FINDINGS Brain: No intracranial hemorrhage, mass effect, or evidence of acute infarct. No hydrocephalus. No extra-axial fluid collection. Generalized cerebral atrophy. Ill-defined hypoattenuation within the cerebral white matter is nonspecific but consistent with chronic small vessel ischemic disease. Chronic bilateral lacunar infarcts in the basal ganglia. Vascular: No hyperdense vessel. Intracranial arterial calcification. Skull: No fracture or focal lesion. Sinuses/Orbits: No acute finding. Paranasal sinuses and mastoid air cells are well aerated. Other: None.  CT CERVICAL SPINE FINDINGS Alignment: No evidence of traumatic malalignment. Skull base and vertebrae: No acute fracture. No primary bone lesion or focal pathologic process. Soft tissues and spinal canal: No prevertebral fluid or swelling. No visible canal hematoma. Disc levels: Multilevel  spondylosis, disc space height loss, and degenerative endplate changes greatest at C4-C5 and C5-C6 where it is moderate. No severe spinal canal narrowing. Upper chest: Biapical pleural-parenchymal scarring greater on the right. No acute abnormality. Other: None. IMPRESSION: 1. No acute intracranial abnormality. Generalized atrophy and small vessel white matter disease. 2. No acute fracture in the cervical spine. Multilevel degenerative spondylosis. Electronically Signed   By: Minerva Fester M.D.   On: 02/19/2023 18:15        Scheduled Meds:  acetaminophen  650 mg Oral BID   aspirin EC  81 mg Oral Daily   carbidopa-levodopa  1.5 tablet Oral Q24H   carbidopa-levodopa  2 tablet Oral 2 times per day   enoxaparin (LOVENOX) injection  40 mg Subcutaneous Q24H   feeding supplement  237 mL Oral BID BM   lisinopril  2.5 mg Oral Daily   polyethylene glycol  17 g Oral Daily   rOPINIRole  0.125 mg Oral QHS   thiamine (VITAMIN B1) injection  100 mg Intravenous Daily   Continuous Infusions:   LOS: 0 days    Time spent: over 30 min    Lacretia Nicks, MD [facility]   To contact the attending provider between 7A-7P or the covering provider during after hours 7P-7A, please log into the web site [email or web address] and access using universal [place] password for that web site. If you do not have the password, please call the hospital operator.  02/20/2023, 7:56 PM

## 2023-02-20 NOTE — Evaluation (Signed)
Physical Therapy Evaluation Patient Details Name: Hannah Fisher MRN: [id number] DOB: [birth year] Today's Date: 02/20/2023  History of Present Illness  72 yo female admitted to hospital on 02/19/2023 due to recurrent falls one of witch pt struck her head, denies LOC or new onset of head/neck pain, pt reports feeling lightheaded. Pt is a poor historian and family indicates poor PO intake and possible non compliance with medication management. Pt was found to have abn labs, CXR negative, head and neck CT negative for acute findings with mild degenerative changes of c-spine, small vessel and white matter dz.. Pt has PMH including but not limited to PD, HTN, OA, deafness L ear and shingles.  Clinical Impression    Pt admitted with above diagnosis.  Pt currently with functional limitations due to the deficits listed below (see PT Problem List). Pt in bed when PT arrived, pt reported having B LE pain, nurse arrived and provided pain medication. Pt appears to be oriented to self and location with increased time, pt is confused and required encouragement for participation with therapy and apparent self limiting behaviors. Pt required mod A for supine to sit, max A for sit to supine, pt required min A for STS from EOB with cues and gait tasks 30 feet with RW, min guard and noted narrow BOS with deficits with turns. Bp assessed for orthostatic hypotension, nurse aware please see below. Pt declined to sit up in recliner and returned to bed. Pt will benefit from acute skilled PT in current and next venue to increase their independence and safety with mobility to allow discharge.   Bp supine 149/101 (89 PR) Bp Seated EOB 155/111 (96 PR) Bp immediate standing 142/94 (102 PR) Bp s/p 3 min stand 160/104 (107 PR) no reports of dizziness       Recommendations for follow up therapy are one component of a multi-disciplinary discharge planning process, led by the attending physician.  Recommendations may be updated based  on patient status, additional functional criteria and insurance authorization.  Follow Up Recommendations       Assistance Recommended at Discharge Frequent or constant Supervision/Assistance  Patient can return home with the following  A little help with walking and/or transfers;A little help with bathing/dressing/bathroom;Assistance with cooking/housework;Direct supervision/assist for medications management;Direct supervision/assist for financial management;Assist for transportation;Help with stairs or ramp for entrance    Equipment Recommendations None recommended by PT (pt reports DME in home setting)  Recommendations for Other Services       Functional Status Assessment Patient has had a recent decline in their functional status and demonstrates the ability to make significant improvements in function in a reasonable and predictable amount of time.     Precautions / Restrictions Precautions Precautions: Fall Restrictions Weight Bearing Restrictions: No      Mobility  Bed Mobility Overal bed mobility: Needs Assistance Bed Mobility: Supine to Sit, Sit to Supine     Supine to sit: Mod assist, HOB elevated Sit to supine: Max assist   General bed mobility comments: pt required cues for encouragement and participation. pt reported she was unable to attempt to lift her own legs back into bed, once in bed pt declined to assist to reposition reporting she could not do it    Transfers Overall transfer level: Needs assistance Equipment used: Rolling walker (2 wheels) Transfers: Sit to/from Stand Sit to Stand: Min assist           General transfer comment: cues for proper UE placement, power up and extension  posture once in standing    Ambulation/Gait Ambulation/Gait assistance: Min guard Gait Distance (Feet): 30 Feet Assistive device: Rolling walker (2 wheels) Gait Pattern/deviations: Step-to pattern, Trunk flexed Gait velocity: decreased     General Gait Details:  narrow BOS with deficits with turns  Information systems manager [place] (Stroke Patients Only)       Balance Overall balance assessment: Needs assistance, History of Falls Sitting-balance support: Feet supported Sitting balance-Leahy Scale: Fair     Standing balance support: Bilateral upper extremity supported, During functional activity, Reliant on assistive device for balance Standing balance-Leahy Scale: Poor                               Pertinent Vitals/Pain Pain Assessment Pain Assessment: 0-10 Pain Score: 8  Pain Location: B LEs Pain Descriptors / Indicators: Aching, Constant, Discomfort Pain Intervention(s): Limited activity within patient's tolerance, Monitored during session, Patient requesting pain meds-RN notified, Repositioned    Home Living Family/patient expects to be discharged to:: Private residence Living Arrangements: Alone Available Help at Discharge: Family;Friend(s);Available PRN/intermittently Type of Home: House Home Access: Stairs to enter Entrance Stairs-Rails: None Entrance Stairs-Number of Steps: 2   Home Layout: One level Home Equipment: Agricultural consultant (2 wheels)      Prior Function Prior Level of Function : Independent/Modified Independent;History of Falls (last six months)             Mobility Comments: mod I with RW for ADLs and self care, family or social support for IADLs ADLs Comments: pt will need incresaed S and A in d/c venue for medication management, ADL, functional mobility tasks and meals. pt reports she was not eating at home and or taking her medications.     Hand Dominance        Extremity/Trunk Assessment        Lower Extremity Assessment Lower Extremity Assessment: Generalized weakness    Cervical / Trunk Assessment Cervical / Trunk Assessment: Other exceptions (slight head forward)  Communication   Communication: No difficulties  Cognition Arousal/Alertness:  Awake/alert Behavior During Therapy: Agitated Overall Cognitive Status: Impaired/Different from baseline Area of Impairment: Attention, Memory, Safety/judgement                     Memory: Decreased short-term memory   Safety/Judgement: Decreased awareness of safety     General Comments: pt is oriented to self and with increased time able to communicate location, pt is confused , often repeats self and requests with minimal initiation to participate with therapy        General Comments      Exercises     Assessment/Plan    PT Assessment Patient needs continued PT services  PT Problem List Decreased strength;Decreased activity tolerance;Decreased balance;Decreased mobility;Decreased coordination;Decreased cognition;Decreased safety awareness;Pain       PT Treatment Interventions DME instruction;Gait training;Stair training;Functional mobility training;Therapeutic activities;Therapeutic exercise;Balance training;Neuromuscular re-education;Patient/family education    PT Goals (Current goals can be found in the Care Plan section)  Acute Rehab PT Goals Patient Stated Goal: to take a nap PT Goal Formulation: With patient Time For Goal Achievement: 03/06/23 Potential to Achieve Goals: Good    Frequency Min 1X/week     Co-evaluation               AM-PAC PT "6 Clicks" Mobility  Outcome Measure Help needed turning from  your back to your side while in a flat bed without using bedrails?: A Little Help needed moving from lying on your back to sitting on the side of a flat bed without using bedrails?: A Little Help needed moving to and from a bed to a chair (including a wheelchair)?: A Little Help needed standing up from a chair using your arms (e.g., wheelchair or bedside chair)?: A Little Help needed to walk in hospital room?: A Little Help needed climbing 3-5 steps with a railing? : Total 6 Click Score: 16    End of Session Equipment Utilized During Treatment:  Gait belt Activity Tolerance: Patient limited by fatigue;Patient limited by pain Patient left: in bed;with call bell/phone within reach;with bed alarm set Nurse Communication: Mobility status;Other (comment) (Bp findings) PT Visit Diagnosis: Unsteadiness on feet (R26.81);Other abnormalities of gait and mobility (R26.89);Repeated falls (R29.6);Muscle weakness (generalized) (M62.81);Difficulty in walking, not elsewhere classified (R26.2);Pain    Time: 1610-9604 PT Time Calculation (min) (ACUTE ONLY): 31 min   Charges:   PT Evaluation $PT Eval Low Complexity: 1 Low PT Treatments $Gait Training: 8-22 mins        Johnny Bridge, PT Acute Rehab   Jacqualyn Posey 02/20/2023, 12:42 PM

## 2023-02-20 NOTE — Progress Notes (Signed)
Mobility Specialist - Progress Note   02/20/23 1021  Mobility  Activity Ambulated with assistance to bathroom  Level of Assistance Minimal assist, patient does 75% or more  Assistive Device Front wheel walker  Distance Ambulated (ft) 50 ft  Range of Motion/Exercises Active  Activity Response Tolerated well  Mobility Referral Yes  $Mobility charge 1 Mobility  Mobility Specialist Start Time (ACUTE ONLY) 1000  Mobility Specialist Stop Time (ACUTE ONLY) 1009  Mobility Specialist Time Calculation (min) (ACUTE ONLY) 9 min   Pt received getting OOB, requested assistance to restroom. Pt sat on toilet then immediately stood up, stated that she used restroom with nothing left in toilet.   Pt returned to bed with all needs met.  Marilynne Halsted Mobility Specialist

## 2023-02-21 DIAGNOSIS — Z9181 History of falling: Secondary | ICD-10-CM | POA: Diagnosis not present

## 2023-02-21 DIAGNOSIS — E538 Deficiency of other specified B group vitamins: Secondary | ICD-10-CM | POA: Diagnosis present

## 2023-02-21 DIAGNOSIS — G20B2 Parkinson's disease with dyskinesia, with fluctuations: Secondary | ICD-10-CM

## 2023-02-21 DIAGNOSIS — Z79899 Other long term (current) drug therapy: Secondary | ICD-10-CM | POA: Diagnosis not present

## 2023-02-21 DIAGNOSIS — I10 Essential (primary) hypertension: Secondary | ICD-10-CM | POA: Diagnosis present

## 2023-02-21 DIAGNOSIS — G20A1 Parkinson's disease without dyskinesia, without mention of fluctuations: Secondary | ICD-10-CM | POA: Diagnosis present

## 2023-02-21 DIAGNOSIS — R197 Diarrhea, unspecified: Secondary | ICD-10-CM | POA: Diagnosis present

## 2023-02-21 DIAGNOSIS — Z681 Body mass index (BMI) 19 or less, adult: Secondary | ICD-10-CM | POA: Diagnosis not present

## 2023-02-21 DIAGNOSIS — R531 Weakness: Secondary | ICD-10-CM | POA: Diagnosis not present

## 2023-02-21 DIAGNOSIS — Z823 Family history of stroke: Secondary | ICD-10-CM | POA: Diagnosis not present

## 2023-02-21 DIAGNOSIS — Z7982 Long term (current) use of aspirin: Secondary | ICD-10-CM | POA: Diagnosis not present

## 2023-02-21 DIAGNOSIS — Z880 Allergy status to penicillin: Secondary | ICD-10-CM | POA: Diagnosis not present

## 2023-02-21 DIAGNOSIS — Z8249 Family history of ischemic heart disease and other diseases of the circulatory system: Secondary | ICD-10-CM | POA: Diagnosis not present

## 2023-02-21 DIAGNOSIS — Z888 Allergy status to other drugs, medicaments and biological substances status: Secondary | ICD-10-CM | POA: Diagnosis not present

## 2023-02-21 DIAGNOSIS — W19XXXA Unspecified fall, initial encounter: Secondary | ICD-10-CM | POA: Diagnosis present

## 2023-02-21 DIAGNOSIS — Z882 Allergy status to sulfonamides status: Secondary | ICD-10-CM | POA: Diagnosis not present

## 2023-02-21 DIAGNOSIS — R54 Age-related physical debility: Secondary | ICD-10-CM | POA: Diagnosis present

## 2023-02-21 DIAGNOSIS — R636 Underweight: Secondary | ICD-10-CM | POA: Diagnosis present

## 2023-02-21 DIAGNOSIS — F0280 Dementia in other diseases classified elsewhere without behavioral disturbance: Secondary | ICD-10-CM | POA: Diagnosis present

## 2023-02-21 DIAGNOSIS — Z90710 Acquired absence of both cervix and uterus: Secondary | ICD-10-CM | POA: Diagnosis not present

## 2023-02-21 DIAGNOSIS — Z87442 Personal history of urinary calculi: Secondary | ICD-10-CM | POA: Diagnosis not present

## 2023-02-21 DIAGNOSIS — E876 Hypokalemia: Secondary | ICD-10-CM | POA: Diagnosis present

## 2023-02-21 DIAGNOSIS — M810 Age-related osteoporosis without current pathological fracture: Secondary | ICD-10-CM | POA: Diagnosis present

## 2023-02-21 DIAGNOSIS — H9192 Unspecified hearing loss, left ear: Secondary | ICD-10-CM | POA: Diagnosis present

## 2023-02-21 DIAGNOSIS — R296 Repeated falls: Secondary | ICD-10-CM | POA: Diagnosis present

## 2023-02-21 LAB — COMPREHENSIVE METABOLIC PANEL
ALT: <5 U/L (ref 0–44)
AST: 15 U/L (ref 15–41)
Albumin: 3.6 g/dL (ref 3.5–5.0)
Alkaline Phosphatase: 58 U/L (ref 38–126)
Anion gap: 5 (ref 5–15)
BUN: 17 mg/dL (ref 8–23)
CO2: 24 mmol/L (ref 22–32)
Calcium: 8.8 mg/dL — ABNORMAL LOW (ref 8.9–10.3)
Chloride: 107 mmol/L (ref 98–111)
Creatinine, Ser: 0.66 mg/dL (ref 0.44–1.00)
GFR, Estimated: >60 mL/min (ref >60)
Glucose, Bld: 111 mg/dL — ABNORMAL HIGH (ref 70–99)
Potassium: 4.1 mmol/L (ref 3.5–5.1)
Sodium: 136 mmol/L (ref 135–145)
Total Bilirubin: 0.6 mg/dL (ref 0.3–1.2)
Total Protein: 6 g/dL — ABNORMAL LOW (ref 6.5–8.1)

## 2023-02-21 LAB — CBC WITH DIFFERENTIAL/PLATELET
Abs Immature Granulocytes: 0.01 10*3/uL (ref 0.00–0.07)
Basophils Absolute: 0 10*3/uL (ref 0.0–0.1)
Basophils Relative: 0 %
Eosinophils Absolute: 0.1 10*3/uL (ref 0.0–0.5)
Eosinophils Relative: 2 %
HCT: 37.5 % (ref 36.0–46.0)
Hemoglobin: 12.2 g/dL (ref 12.0–15.0)
Immature Granulocytes: 0 %
Lymphocytes Relative: 31 %
Lymphs Abs: 1.5 10*3/uL (ref 0.7–4.0)
MCH: 30.9 pg (ref 26.0–34.0)
MCHC: 32.5 g/dL (ref 30.0–36.0)
MCV: 94.9 fL (ref 80.0–100.0)
Monocytes Absolute: 0.3 10*3/uL (ref 0.1–1.0)
Monocytes Relative: 7 %
Neutro Abs: 3 10*3/uL (ref 1.7–7.7)
Neutrophils Relative %: 60 %
Platelets: 188 10*3/uL (ref 150–400)
RBC: 3.95 MIL/uL (ref 3.87–5.11)
RDW: 13 % (ref 11.5–15.5)
WBC: 5 10*3/uL (ref 4.0–10.5)
nRBC: 0 % (ref 0.0–0.2)

## 2023-02-21 LAB — VITAMIN B12: Vitamin B-12: 164 pg/mL — ABNORMAL LOW (ref 180–914)

## 2023-02-21 LAB — MAGNESIUM: Magnesium: 2.2 mg/dL (ref 1.7–2.4)

## 2023-02-21 LAB — TSH: TSH: 0.696 u[IU]/mL (ref 0.350–4.500)

## 2023-02-21 LAB — FOLATE: Folate: 3.5 ng/mL — ABNORMAL LOW (ref >5.9)

## 2023-02-21 LAB — PHOSPHORUS: Phosphorus: 3.2 mg/dL (ref 2.5–4.6)

## 2023-02-21 MED ORDER — FOLIC ACID 1 MG PO TABS
1.0000 mg | ORAL_TABLET | Freq: Every day | ORAL | Status: DC
  Administered 2023-02-21 – 2023-02-23 (×3): 1 mg via ORAL
  Filled 2023-02-21 (×3): qty 1

## 2023-02-21 MED ORDER — ENSURE ENLIVE PO LIQD
237.0000 mL | Freq: Three times a day (TID) | ORAL | Status: DC
  Administered 2023-02-22 – 2023-02-23 (×4): 237 mL via ORAL

## 2023-02-21 MED ORDER — CYANOCOBALAMIN 1000 MCG/ML IJ SOLN
1000.0000 ug | Freq: Every day | INTRAMUSCULAR | Status: DC
  Administered 2023-02-21 – 2023-02-23 (×3): 1000 ug via SUBCUTANEOUS
  Filled 2023-02-21 (×3): qty 1

## 2023-02-21 MED ORDER — ROPINIROLE HCL 0.25 MG PO TABS
0.1250 mg | ORAL_TABLET | Freq: Once | ORAL | Status: AC
  Administered 2023-02-21: 0.125 mg via ORAL
  Filled 2023-02-21: qty 0.5

## 2023-02-21 MED ORDER — ALPRAZOLAM 0.25 MG PO TABS
0.2500 mg | ORAL_TABLET | Freq: Two times a day (BID) | ORAL | Status: DC | PRN
  Administered 2023-02-22 – 2023-02-23 (×3): 0.25 mg via ORAL
  Filled 2023-02-21 (×3): qty 1

## 2023-02-21 MED ORDER — ADULT MULTIVITAMIN W/MINERALS CH
1.0000 | ORAL_TABLET | Freq: Every day | ORAL | Status: DC
  Administered 2023-02-22 – 2023-02-23 (×2): 1 via ORAL
  Filled 2023-02-21 (×2): qty 1

## 2023-02-21 MED ORDER — HALOPERIDOL LACTATE 5 MG/ML IJ SOLN
2.0000 mg | Freq: Once | INTRAMUSCULAR | Status: AC
  Administered 2023-02-21: 2 mg via INTRAVENOUS
  Filled 2023-02-21: qty 1

## 2023-02-21 NOTE — Progress Notes (Signed)
Initial Nutrition Assessment  DOCUMENTATION CODES:   Underweight  INTERVENTION:  - Regular diet.  - Ensure Plus High Protein po TID, each supplement provides 350 kcal and 20 grams of protein. - Multivitamin with minerals daily - Monitor weight trends.   NUTRITION DIAGNOSIS:   Underweight related to chronic illness as evidenced by  (BMI 15.70).  GOAL:   Patient will meet greater than or equal to 90% of their needs  MONITOR:   PO intake, Supplement acceptance, Weight trends, Labs  REASON FOR ASSESSMENT:   Malnutrition Screening Tool    ASSESSMENT:   72 y.o. female with medical history significant of Parkinson's disease, HTN, osteoporosis who presented after a fall.  Patient reports a UBW of 100# and denied any recent changes in weight.  Per EMR, weight between 79-87# over the past year with no significant changes.   Endorses eating 2 large meals a day at home and Ensure 2x/day. Appetite good recently.   She reports her current appetite is decreased. Documented to be consuming 60-80% of meals since admit. Has been receiving Ensure BID and consuming.   Patient noted to be a little confused during visit and confusion noted in problem list. RN put in note this afternoon that patient ambulating halls without assistance and yelling "help me" so question accuracy of her nutrition history report. Patient started on high dose thiamine by MD.   Medications reviewed and include: Sinemet, Miralax, Thiamine  Labs reviewed:  -    NUTRITION - FOCUSED PHYSICAL EXAM:  Unable to perform, patient had to use to bathroom  Diet Order:   Diet Order             Diet regular Room service appropriate? Yes; Fluid consistency: Thin  Diet effective now                   EDUCATION NEEDS:  Education needs have been addressed  Skin:  Skin Assessment: Reviewed RN Assessment  Last BM:  5/18  Height:  Ht Readings from Last 1 Encounters:  02/19/23 5\' 1"  (1.549 m)   Weight:   Wt Readings from Last 1 Encounters:  02/19/23 37.7 kg    BMI:  Body mass index is 15.7 kg/m.  Estimated Nutritional Needs:  Kcal:  1500-1700 kcals Protein:  55-75 grams Fluid:  >/= 1.5L    Shelle Iron RD, LDN For contact information, refer to [facility].

## 2023-02-21 NOTE — [place] FL2 (Signed)
[place] MEDICAID FL2 LEVEL OF CARE FORM     IDENTIFICATION  Patient Name: Hannah Fisher Birthdate: August 27, 1951 Sex: female Admission Date (Current Location): 02/19/2023  [facility] and [place] Number:  Producer, television/film/video and Address:  [facility],  [address] [place], [place] [address]      Provider Number: [id number]  Attending Physician Name and Address:  Standley Brooking, MD  Relative Name and Phone Number:  Daughter, Maud Deed [phone]    Current Level of Care: Hospital Recommended Level of Care: Skilled Nursing Facility Prior Approval Number:    Date Approved/Denied:   PASRR Number: [id number]  Discharge Plan: SNF    Current Diagnoses: Patient Active Problem List   Diagnosis Date Noted   Hypokalemia 02/19/2023   HTN (hypertension) 02/19/2023   Weakness 02/19/2023   Impairment of balance 08/23/2021   Parkinson's disease 01/28/2021   Abnormal gait 01/24/2021   Allergic rhinitis 01/24/2021   Allergic rhinitis due to animal (cat) (dog) hair and dander 01/24/2021   Anxiety 01/24/2021   Chronic constipation 01/24/2021   Difficulty sleeping 01/24/2021   Gastritis 01/24/2021   High cholesterol 01/24/2021   Protein calorie malnutrition (HCC) 01/24/2021   Rectal bleeding 01/24/2021   Recurrent falls 01/24/2021   Restless legs 01/24/2021   Sciatica 01/24/2021   Standard chest x-ray abnormal 01/24/2021   Vitamin D deficiency 01/24/2021   Acute encephalopathy 12/28/2020   Rhabdomyolysis 12/28/2020   Lightheadedness 06/22/2020   Other abnormalities of gait and mobility 05/03/2020   Lumbar radiculopathy 04/28/2020   Dysphonia 12/03/2019   Age-related osteoporosis without current pathological fracture 11/25/2019   Dysfunction of both eustachian tubes 05/19/2019   Presbycusis of both ears 05/19/2019   Unilateral deafness, left 04/07/2019   Mass 04/17/2018   Deafness    Shingles    Osteoporosis    Hypertension     Orientation RESPIRATION  BLADDER Height & Weight     Self, Place, Situation  Normal Continent Weight: 83 lb 1.8 oz (37.7 kg) Height:  5\' 1"  (154.9 cm)  BEHAVIORAL SYMPTOMS/MOOD NEUROLOGICAL BOWEL NUTRITION STATUS      Incontinent Diet (See discharge summary)  AMBULATORY STATUS COMMUNICATION OF NEEDS Skin   Limited Assist Verbally Normal                       Personal Care Assistance Level of Assistance  Bathing, Feeding, Dressing Bathing Assistance: Limited assistance Feeding assistance: Independent Dressing Assistance: Limited assistance     Functional Limitations Info  Sight, Hearing, Speech Sight Info: Adequate Hearing Info: Adequate Speech Info: Adequate    SPECIAL CARE FACTORS FREQUENCY  PT (By licensed PT), OT (By licensed OT)     PT Frequency: 5x/wk OT Frequency: 5x/wk            Contractures Contractures Info: Not present    Additional Factors Info  Code Status, Allergies Code Status Info: FULL Allergies Info: Prednisone, Beta Adrenergic Blockers, Penicillins, Sulfa Antibiotics           Current Medications (02/21/2023):  This is the current hospital active medication list Current Facility-Administered Medications  Medication Dose Route Frequency Provider Last Rate Last Admin   acetaminophen (TYLENOL) tablet 650 mg  650 mg Oral BID Tu, Ching T, DO   650 mg at 02/21/23 0946   aspirin EC tablet 81 mg  81 mg Oral Daily Tu, Ching T, DO   81 mg at 02/21/23 8469   carbidopa-levodopa (SINEMET IR) 25-100 MG per tablet immediate release  1.5 tablet  1.5 tablet Oral Q24H Tu, Ching T, DO   1.5 tablet at 02/20/23 2018   carbidopa-levodopa (SINEMET IR) 25-100 MG per tablet immediate release 2 tablet  2 tablet Oral 2 times per day Tu, Ching T, DO   2 tablet at 02/21/23 1050   enoxaparin (LOVENOX) injection 40 mg  40 mg Subcutaneous Q24H Tu, Ching T, DO   40 mg at 02/20/23 2113   feeding supplement (ENSURE ENLIVE / ENSURE PLUS) liquid 237 mL  237 mL Oral BID BM Tu, Ching T, DO   237 mL at  02/21/23 0921   lisinopril (ZESTRIL) tablet 2.5 mg  2.5 mg Oral Daily Tu, Ching T, DO   2.5 mg at 02/21/23 1610   polyethylene glycol (MIRALAX / GLYCOLAX) packet 17 g  17 g Oral Daily Tu, Ching T, DO   17 g at 02/21/23 9604   rOPINIRole (REQUIP) tablet 0.125 mg  0.125 mg Oral QHS Tu, Ching T, DO   0.125 mg at 02/20/23 2113   thiamine (VITAMIN B1) 500 mg in sodium chloride 0.9 % 50 mL IVPB  500 mg Intravenous Q8H Zigmund Daniel., MD   Stopped at 02/21/23 0536   Followed by   Melene Muller ON 02/24/2023] thiamine (VITAMIN B1) 250 mg in sodium chloride 0.9 % 50 mL IVPB  250 mg Intravenous Daily Zigmund Daniel., MD       Followed by   Melene Muller ON 03/01/2023] thiamine (VITAMIN B1) tablet 100 mg  100 mg Oral Daily Zigmund Daniel., MD         Discharge Medications: Please see discharge summary for a list of discharge medications.  Relevant Imaging Results:  Relevant Lab Results:   Additional Information SSN: [id number] [address] [place], LCSW

## 2023-02-21 NOTE — Progress Notes (Signed)
  Progress Note   Patient: Hannah Fisher ZOX:[id number] DOB: August 19, 1951 DOA: 02/19/2023     0 DOS: the patient was seen and examined on 02/21/2023   Brief hospital course:   Assessment and Plan: Hypokalemia Resolved.   Weakness Fall Thought secondary to decreased oral intake, Parkinson's disease  B-12 and folate low.  Repletion ordered. Plan for SNF   Essential show HTN Stable.  Continue lisinopril.   Parkinson's disease Delirium likely related to Parkinson's disease follows with [facility] neurology continue carbidopa-levodopa continue requip      Subjective:  Feels ok Confused per nursing Up in hallway  Physical Exam: Vitals:   02/20/23 1231 02/20/23 2141 02/21/23 0626 02/21/23 1301  BP: 114/77 124/85 (!) 153/107 (!) 142/98  Pulse: 79 76 80 83  Resp: 16 18 18 16   Temp: 98.2 F (36.8 C) 98 F (36.7 C) (!) 97.5 F (36.4 C) 98.3 F (36.8 C)  TempSrc: Oral Oral Oral Oral  SpO2: 99% 98% 100% 100%  Weight:      Height:       Physical Exam Vitals reviewed.  Constitutional:      General: She is not in acute distress.    Appearance: She is not ill-appearing or toxic-appearing.  Cardiovascular:     Rate and Rhythm: Normal rate and regular rhythm.     Heart sounds: No murmur heard. Pulmonary:     Effort: Pulmonary effort is normal. No respiratory distress.     Breath sounds: No wheezing, rhonchi or rales.  Neurological:     Mental Status: She is alert.  Psychiatric:        Mood and Affect: Mood normal.        Behavior: Behavior normal.     Data Reviewed: CMP noted  Family Communication: daughter at bedside  Disposition: Status is: Inpatient   Planned Discharge Destination: Skilled nursing facility    Time spent: 35 minutes  Author: Brendia Sacks, MD 02/21/2023 6:24 PM  For on call review [email or web address].

## 2023-02-21 NOTE — Progress Notes (Signed)
Pt is walking in the hallway without walker, refusing to return to room. She is screaming "Help me, Help Help" and pushing the staff. She is refusing all medication. I notified MD for medication for agitation. I also updated her daughter who stated she would  come talk with her. The ordered medication has thus far had little effect on patient

## 2023-02-21 NOTE — TOC Progression Note (Signed)
Transition of Care [facility]) - Progression Note    Patient Details  Name: Hannah Fisher MRN: [id number] Date of Birth: Sep 26, 1951  [facility]) CM/SW Contact  Otelia Santee, LCSW Phone Number: 02/21/2023, 10:59 AM  Clinical Narrative:    Spoke with pt's daughter to discuss recommendation for SNF placement. Pt's daughter agreeable to SNF and shares that she is familiar with [facility] and [facility].  Referrals have been sent out for SNF placement and currently pending bed offers.    Expected Discharge Plan: Skilled Nursing Facility Barriers to Discharge: Continued Medical Work up  Expected Discharge Plan and Services   Discharge Planning Services: NA   Living arrangements for the past 2 months: Single Family Home                                       Social Determinants of Health (SDOH) Interventions SDOH Screenings   Food Insecurity: Food Insecurity Present (02/19/2023)  Housing: Low Risk  (02/19/2023)  Transportation Needs: No Transportation Needs (02/19/2023)  Utilities: At Risk (02/19/2023)  Tobacco Use: Low Risk  (02/19/2023)    Readmission Risk Interventions     No data to display

## 2023-02-22 DIAGNOSIS — G20B2 Parkinson's disease with dyskinesia, with fluctuations: Secondary | ICD-10-CM | POA: Diagnosis not present

## 2023-02-22 DIAGNOSIS — R531 Weakness: Secondary | ICD-10-CM | POA: Diagnosis not present

## 2023-02-22 DIAGNOSIS — G20A1 Parkinson's disease without dyskinesia, without mention of fluctuations: Secondary | ICD-10-CM

## 2023-02-22 DIAGNOSIS — E876 Hypokalemia: Secondary | ICD-10-CM | POA: Diagnosis not present

## 2023-02-22 DIAGNOSIS — F02A0 Dementia in other diseases classified elsewhere, mild, without behavioral disturbance, psychotic disturbance, mood disturbance, and anxiety: Secondary | ICD-10-CM

## 2023-02-22 MED ORDER — THIAMINE MONONITRATE 100 MG PO TABS
500.0000 mg | ORAL_TABLET | Freq: Three times a day (TID) | ORAL | Status: DC
  Administered 2023-02-22 – 2023-02-23 (×4): 500 mg via ORAL
  Filled 2023-02-22 (×6): qty 5

## 2023-02-22 MED ORDER — THIAMINE MONONITRATE 100 MG PO TABS: 250.0000 mg | ORAL_TABLET | Freq: Every day | ORAL | Status: DC

## 2023-02-22 NOTE — TOC Progression Note (Addendum)
Transition of Care [facility]) - Progression Note    Patient Details  Name: JOURNII VEREEN MRN: [id number] Date of Birth: Nov 28, 1950  Transition [facility]) CM/SW Contact  Otelia Santee, LCSW Phone Number: 02/22/2023, 11:18 AM  Clinical Narrative:    Reviewed bed offers with pt's daughter. Pt's daughter [name] plans to visit facilities prior to making decision for placement.   Update 1:30pm- Pt's daughter accepted bed offer for SNF at [facility]. Insurance Berkley Harvey has been requesting and currently is pending. Pt able to transfer to SNF once insurance approved.   Expected Discharge Plan: Skilled Nursing Facility Barriers to Discharge: Continued Medical Work up  Expected Discharge Plan and Services   Discharge Planning Services: NA   Living arrangements for the past 2 months: Single Family Home                                       Social Determinants of Health (SDOH) Interventions SDOH Screenings   Food Insecurity: Food Insecurity Present (02/19/2023)  Housing: Low Risk  (02/19/2023)  Transportation Needs: No Transportation Needs (02/19/2023)  Utilities: At Risk (02/19/2023)  Tobacco Use: Low Risk  (02/19/2023)    Readmission Risk Interventions     No data to display

## 2023-02-22 NOTE — Progress Notes (Signed)
Physical Therapy Treatment Patient Details Name: Hannah Fisher MRN: [id number] DOB: 1950/10/11 Today's Date: 02/22/2023   History of Present Illness 72 yo female admitted to hospital on 02/19/2023 due to recurrent falls one of witch pt struck her head, denies LOC or new onset of head/neck pain, pt reports feeling lightheaded. Pt is a poor historian and family indicates poor PO intake and possible non compliance with medication management. Pt was found to have abn labs, CXR negative, head and neck CT negative for acute findings with mild degenerative changes of c-spine, small vessel and white matter dz.. Pt has PMH including but not limited to PD, HTN, OA, deafness L ear and shingles.    PT Comments    General Comments: pt is oriented to self and with increased time able to communicate location, pt is confused , often repeats self and requests with minimal initiation to participate with therapy Pt in bed with safety sitter in room.  Assisted OOB to amb in hallway was challenging.  General bed mobility comments: pt required cues for encouragement and participation. pt reported she was unable to attempt to lift her own legs back into bed.  General transfer comment: cues for proper UE placement, power up and extension posture once in standing.  Also asissted with a toilet transfer.  General Gait Details: amb with and without RW.  Without walker pt required Mod Asisst to prevent LOB present with anterior lean and "toe stepping" gait with a narrow BOS ans well as lateral staggering.  HIGH FALL RISK.  Amb with walker required Min Assist straight away but Mod asisst with turns.  Poor coordination.  Slight festination and ataxia.  Poor self coorection reaction responce.  HIGH FALL RISK.  Assisted to bathroom then back to bed.   Pt will need ST Rehab at SNF to address mobility and functional decline prior to safely returning home alone.   Recommendations for follow up therapy are one component of a  multi-disciplinary discharge planning process, led by the attending physician.  Recommendations may be updated based on patient status, additional functional criteria and insurance authorization.  Follow Up Recommendations  Can patient physically be transported by private vehicle: Yes    Assistance Recommended at Discharge Frequent or constant Supervision/Assistance  Patient can return home with the following A little help with walking and/or transfers;A little help with bathing/dressing/bathroom;Assistance with cooking/housework;Direct supervision/assist for medications management;Direct supervision/assist for financial management;Assist for transportation;Help with stairs or ramp for entrance   Equipment Recommendations  None recommended by PT    Recommendations for Other Services       Precautions / Restrictions Precautions Precautions: Fall Precaution Comments: Hx Parkinson's Restrictions Weight Bearing Restrictions: No     Mobility  Bed Mobility Overal bed mobility: Needs Assistance Bed Mobility: Supine to Sit, Sit to Supine     Supine to sit: Min assist Sit to supine: Min assist   General bed mobility comments: pt required cues for encouragement and participation. pt reported she was unable to attempt to lift her own legs back into bed    Transfers Overall transfer level: Needs assistance Equipment used: Rolling walker (2 wheels), None Transfers: Sit to/from Stand Sit to Stand: Min assist           General transfer comment: cues for proper UE placement, power up and extension posture once in standing.  Also asissted with a toilet transfer    Ambulation/Gait Ambulation/Gait assistance: Min assist, Mod assist Gait Distance (Feet): 24 Feet Assistive device: Rolling walker (  2 wheels), None Gait Pattern/deviations: Step-to pattern, Trunk flexed, Staggering left, Staggering right, Narrow base of support Gait velocity: decreased     General Gait Details: amb  with and without RW.  Without walker pt required Mod Asisst to prevent LOB present with anterior lean and "toe stepping" gait with a narrow BOS ans well as lateral staggering.  HIGH FALL RISK.  Amb with walker required Min Assist straight away but Mod asisst with turns.  Poor coordination.  Slight festination and ataxia.  Poor self coorection reaction responce.  HIGH FALL RISK.   Stairs             Wheelchair Mobility    Modified Rankin (Stroke Patients Only)       Balance                                            Cognition Arousal/Alertness: Awake/alert Behavior During Therapy: Restless Overall Cognitive Status: Impaired/Different from baseline Area of Impairment: Attention, Memory, Safety/judgement                     Memory: Decreased short-term memory   Safety/Judgement: Decreased awareness of safety     General Comments: pt is oriented to self and with increased time able to communicate location, pt is confused , often repeats self and requests with minimal initiation to participate with therapy        Exercises      General Comments        Pertinent Vitals/Pain Pain Assessment Pain Assessment: No/denies pain    Home Living                          Prior Function            PT Goals (current goals can now be found in the care plan section) Progress towards PT goals: Progressing toward goals    Frequency    Min 1X/week      PT Plan Current plan remains appropriate    Co-evaluation              AM-PAC PT "6 Clicks" Mobility   Outcome Measure  Help needed turning from your back to your side while in a flat bed without using bedrails?: A Little Help needed moving from lying on your back to sitting on the side of a flat bed without using bedrails?: A Little Help needed moving to and from a bed to a chair (including a wheelchair)?: A Lot Help needed standing up from a chair using your arms (e.g.,  wheelchair or bedside chair)?: A Lot Help needed to walk in hospital room?: A Lot Help needed climbing 3-5 steps with a railing? : Total 6 Click Score: 13    End of Session Equipment Utilized During Treatment: Gait belt Activity Tolerance: Patient limited by fatigue Patient left: in bed;with call bell/phone within reach;with bed alarm set Nurse Communication: Mobility status PT Visit Diagnosis: Unsteadiness on feet (R26.81);Other abnormalities of gait and mobility (R26.89);Repeated falls (R29.6);Muscle weakness (generalized) (M62.81);Difficulty in walking, not elsewhere classified (R26.2);Pain     Time: 1610-9604 PT Time Calculation (min) (ACUTE ONLY): 21 min  Charges:  $Gait Training: 8-22 mins                     {Cerrone Debold  PTA Acute  Rehabilitation Services  Office M-F          [phone]

## 2023-02-22 NOTE — Progress Notes (Signed)
  Progress Note   Patient: Hannah Fisher WJX:[id number] DOB: [birth year] DOA: 02/19/2023     1 DOS: the patient was seen and examined on 02/22/2023   Brief hospital course: No notes on file  Assessment and Plan: Hypokalemia Resolved.   Weakness Fall Thought secondary to decreased oral intake, Parkinson's disease  B-12 and folate low.  Repletion ordered. Plan for SNF   Essential show HTN Stable.  Continue lisinopril.   Parkinson's disease Delirium likely related to Parkinson's disease Follows with [facility] neurology Continue carbidopa-levodopa continue requip      Subjective:  Feels ok today  Physical Exam: Vitals:   02/21/23 1301 02/21/23 1845 02/21/23 2159 02/22/23 0700  BP: (!) 142/98 136/78 (!) 150/92 (!) 162/99  Pulse: 83 88 82 88  Resp: 16 18 19 14   Temp: 98.3 F (36.8 C) 97.6 F (36.4 C) (!) 97.4 F (36.3 C) (!) 97.5 F (36.4 C)  TempSrc: Oral Oral Oral Oral  SpO2: 100% 99% 98% 100%  Weight:      Height:       Physical Exam Vitals reviewed.  Constitutional:      General: She is not in acute distress.    Appearance: She is not ill-appearing or toxic-appearing.  Cardiovascular:     Rate and Rhythm: Normal rate and regular rhythm.     Heart sounds: No murmur heard. Pulmonary:     Effort: Pulmonary effort is normal. No respiratory distress.     Breath sounds: No wheezing, rhonchi or rales.  Neurological:     Mental Status: She is alert.     Comments: Mildly confused  Psychiatric:        Mood and Affect: Mood normal.        Behavior: Behavior normal.     Data Reviewed: No new data  Family Communication: none  Disposition: Status is: Inpatient Remains inpatient appropriate because: needs SNF  Planned Discharge Destination: Skilled nursing facility    Time spent: 20 minutes  Author: Brendia Sacks, MD 02/22/2023 9:04 AM  For on call review [email or web address].

## 2023-02-22 NOTE — Progress Notes (Signed)
Patient removed IV, and refuses new IV. MD informed. Patient explained the benefits of having an IV in place for medication and refuses.

## 2023-02-23 ENCOUNTER — Other Ambulatory Visit: Payer: Medicare PPO

## 2023-02-23 DIAGNOSIS — F413 Other mixed anxiety disorders: Secondary | ICD-10-CM | POA: Diagnosis not present

## 2023-02-23 DIAGNOSIS — G20B2 Parkinson's disease with dyskinesia, with fluctuations: Secondary | ICD-10-CM | POA: Diagnosis not present

## 2023-02-23 DIAGNOSIS — M6281 Muscle weakness (generalized): Secondary | ICD-10-CM | POA: Diagnosis not present

## 2023-02-23 DIAGNOSIS — E43 Unspecified severe protein-calorie malnutrition: Secondary | ICD-10-CM | POA: Diagnosis not present

## 2023-02-23 DIAGNOSIS — Z79899 Other long term (current) drug therapy: Secondary | ICD-10-CM | POA: Diagnosis not present

## 2023-02-23 DIAGNOSIS — R531 Weakness: Secondary | ICD-10-CM | POA: Diagnosis not present

## 2023-02-23 DIAGNOSIS — E639 Nutritional deficiency, unspecified: Secondary | ICD-10-CM | POA: Diagnosis not present

## 2023-02-23 DIAGNOSIS — F0390 Unspecified dementia without behavioral disturbance: Secondary | ICD-10-CM | POA: Diagnosis not present

## 2023-02-23 DIAGNOSIS — R5381 Other malaise: Secondary | ICD-10-CM | POA: Diagnosis not present

## 2023-02-23 DIAGNOSIS — R1311 Dysphagia, oral phase: Secondary | ICD-10-CM | POA: Diagnosis not present

## 2023-02-23 DIAGNOSIS — M810 Age-related osteoporosis without current pathological fracture: Secondary | ICD-10-CM | POA: Diagnosis not present

## 2023-02-23 DIAGNOSIS — Z9181 History of falling: Secondary | ICD-10-CM | POA: Diagnosis not present

## 2023-02-23 DIAGNOSIS — R627 Adult failure to thrive: Secondary | ICD-10-CM | POA: Diagnosis not present

## 2023-02-23 DIAGNOSIS — R4189 Other symptoms and signs involving cognitive functions and awareness: Secondary | ICD-10-CM | POA: Diagnosis not present

## 2023-02-23 DIAGNOSIS — F419 Anxiety disorder, unspecified: Secondary | ICD-10-CM | POA: Diagnosis not present

## 2023-02-23 DIAGNOSIS — G20A1 Parkinson's disease without dyskinesia, without mention of fluctuations: Secondary | ICD-10-CM | POA: Diagnosis not present

## 2023-02-23 DIAGNOSIS — I10 Essential (primary) hypertension: Secondary | ICD-10-CM | POA: Diagnosis not present

## 2023-02-23 DIAGNOSIS — R296 Repeated falls: Secondary | ICD-10-CM | POA: Diagnosis not present

## 2023-02-23 DIAGNOSIS — E46 Unspecified protein-calorie malnutrition: Secondary | ICD-10-CM | POA: Diagnosis not present

## 2023-02-23 DIAGNOSIS — R41841 Cognitive communication deficit: Secondary | ICD-10-CM | POA: Diagnosis not present

## 2023-02-23 DIAGNOSIS — R2689 Other abnormalities of gait and mobility: Secondary | ICD-10-CM | POA: Diagnosis not present

## 2023-02-23 DIAGNOSIS — Z681 Body mass index (BMI) 19 or less, adult: Secondary | ICD-10-CM | POA: Diagnosis not present

## 2023-02-23 DIAGNOSIS — F5102 Adjustment insomnia: Secondary | ICD-10-CM | POA: Diagnosis not present

## 2023-02-23 DIAGNOSIS — E559 Vitamin D deficiency, unspecified: Secondary | ICD-10-CM | POA: Diagnosis not present

## 2023-02-23 DIAGNOSIS — E876 Hypokalemia: Secondary | ICD-10-CM | POA: Diagnosis not present

## 2023-02-23 DIAGNOSIS — F1320 Sedative, hypnotic or anxiolytic dependence, uncomplicated: Secondary | ICD-10-CM | POA: Diagnosis not present

## 2023-02-23 DIAGNOSIS — Z7401 Bed confinement status: Secondary | ICD-10-CM | POA: Diagnosis not present

## 2023-02-23 MED ORDER — FOLIC ACID 1 MG PO TABS: 1.0000 mg | ORAL_TABLET | Freq: Every day | ORAL | Status: DC

## 2023-02-23 MED ORDER — ALPRAZOLAM 0.25 MG PO TABS: 0.2500 mg | ORAL_TABLET | Freq: Two times a day (BID) | ORAL | 0 refills | Status: DC | PRN

## 2023-02-23 MED ORDER — THIAMINE MONONITRATE 250 MG PO TABS: 250.0000 mg | ORAL_TABLET | Freq: Every day | ORAL | Status: DC

## 2023-02-23 MED ORDER — CYANOCOBALAMIN 1000 MCG/ML IJ SOLN: INTRAMUSCULAR | 0 refills | Status: AC

## 2023-02-23 NOTE — Discharge Summary (Addendum)
Physician Discharge Summary   Patient: Hannah Fisher MRN: [id number] DOB: 01/22/1951  Admit date:     02/19/2023  Discharge date: 02/23/23  Discharge Physician: Brendia Sacks   PCP: Daisy Floro, MD   Recommendations at discharge:   Folate deficiency B-12 deficiency Replete. Suggest checking B-12 level in 5 weeks   Chronic Xanax use Consider slow taper off under medical supervision given falls I confirmed instructions w/ Dr. Charlott Rakes office  Discharge Diagnoses: Principal Problem:   Hypokalemia Active Problems:   Parkinson's disease   HTN (hypertension)   Weakness Delirium likely related to Parkinson's disease  Underweight only Severe malnutrition has been ruled out  Resolved Problems:   * No resolved hospital problems. *  Hospital Course: 72 year old woman PMH Parkinson disease, cognitive dysfunction, living alone, presented with a history of multiple falls, concern for safety at home.  Admitted for hypokalemia and generalized weakness.  Seen by therapy with recommendation for SNF.  Hospitalization uncomplicated.  Hypokalemia Resolved.   Weakness Fall Thought secondary to decreased oral intake, Parkinson's disease  B-12 and folate low.  Repletion ordered. Plan for SNF  Folate deficiency B-12 deficiency Replete    Essential show HTN Stable.  Continue lisinopril.   Parkinson's disease Delirium likely related to Parkinson's disease Follows with [facility] neurology Continue carbidopa-levodopa Continue requip  Chronic Xanax use Consider slow taper off under medical supervision given falls      Pain control - [facility] Controlled Substance Reporting System database was reviewed. Consultants:  None  Procedures performed:  None   Disposition: Skilled nursing facility Diet recommendation:  Regular diet DISCHARGE MEDICATION: Allergies as of 02/23/2023       Reactions   Prednisone Shortness Of Breath, Swelling, Other (See Comments)   Tightness  in chest, trouble breathing, edema, and chest pressure   Beta Adrenergic Blockers Nausea Only, Other (See Comments)   Made the patient feel badly   Penicillins Hives, Swelling, Other (See Comments)   Edema   Sulfa Antibiotics Rash        Medication List     STOP taking these medications    ciclopirox 8 % solution Commonly known as: Penlac   cyclobenzaprine 5 MG tablet Commonly known as: FLEXERIL   ketoconazole 2 % cream Commonly known as: NIZORAL   levETIRAcetam 500 MG tablet Commonly known as: KEPPRA       TAKE these medications    acetaminophen 650 MG CR tablet Commonly known as: TYLENOL Take 650 mg by mouth in the morning and at bedtime.   ALPRAZolam 0.25 MG tablet Commonly known as: XANAX Take 1 tablet (0.25 mg total) by mouth 2 (two) times daily as needed for anxiety. What changed:  when to take this reasons to take this additional instructions   aspirin EC 81 MG tablet Take 81 mg by mouth daily.   B COMPLEX PO Take 1 tablet by mouth daily with breakfast.   carbidopa-levodopa 25-100 MG tablet Commonly known as: SINEMET IR Take 1.5-2 tablets by mouth See admin instructions. Take 2 tablets by mouth at 9 AM & 12 NOON, then 1.5 tablets at 7 PM   cyanocobalamin 1000 MCG/ML injection Commonly known as: VITAMIN B12 Inject 1 mL (1,000 mcg total) into the skin daily for 4 days, THEN 1 mL (1,000 mcg total) once a week for 28 days. Start taking on: Feb 24, 2023   feeding supplement Liqd Take 237 mLs by mouth 2 (two) times daily between meals.   folic acid 1 MG tablet Commonly  known as: FOLVITE Take 1 tablet (1 mg total) by mouth daily. Start taking on: Feb 24, 2023   lisinopril 2.5 MG tablet Commonly known as: ZESTRIL Take 2.5 mg by mouth daily.   Multivitamin Women Tabs Take 1 tablet by mouth daily with breakfast.   naproxen sodium 220 MG tablet Commonly known as: ALEVE Take 220-440 mg by mouth 2 (two) times daily as needed (for pain).    polyethylene glycol 17 g packet Commonly known as: MIRALAX / GLYCOLAX Take 17 g by mouth daily.   rOPINIRole 0.25 MG tablet Commonly known as: REQUIP Take 0.125 mg by mouth at bedtime.   Thiamine Mononitrate 250 MG Tabs Take 250 mg by mouth daily. Start taking on: Feb 24, 2023        Follow-up Information     Daisy Floro, MD Follow up.   Specialty: Family Medicine Why: As needed Contact information: [address] [place] [address] [phone]                Feels ok  Discharge Exam: Ceasar Mons Weights   02/19/23 2328  Weight: 37.7 kg   Physical Exam Vitals reviewed.  Constitutional:      General: She is not in acute distress.    Appearance: She is not ill-appearing or toxic-appearing.  Cardiovascular:     Rate and Rhythm: Normal rate and regular rhythm.     Heart sounds: No murmur heard. Pulmonary:     Effort: Pulmonary effort is normal. No respiratory distress.     Breath sounds: No wheezing, rhonchi or rales.  Neurological:     Mental Status: She is alert.  Psychiatric:        Mood and Affect: Mood normal.        Behavior: Behavior normal.      Condition at discharge: good  The results of significant diagnostics from this hospitalization (including imaging, microbiology, ancillary and laboratory) are listed below for reference.   Imaging Studies: DG Chest 2 View  Result Date: 02/19/2023 CLINICAL DATA:  Multiple falls. EXAM: CHEST - 2 VIEW COMPARISON:  September 23, 2021. FINDINGS: The heart size and mediastinal contours are within normal limits. Both lungs are clear. The visualized skeletal structures are unremarkable. IMPRESSION: No active cardiopulmonary disease. Electronically Signed   By: Lupita Raider M.D.   On: 02/19/2023 18:26   CT Head Wo Contrast  Result Date: 02/19/2023 CLINICAL DATA:  Multiple falls over the last few days that are unwitnessed. History of dementia and Parkinson's disease EXAM: CT HEAD WITHOUT CONTRAST  CT CERVICAL SPINE WITHOUT CONTRAST TECHNIQUE: Multidetector CT imaging of the head and cervical spine was performed following the standard protocol without intravenous contrast. Multiplanar CT image reconstructions of the cervical spine were also generated. RADIATION DOSE REDUCTION: This exam was performed according to the departmental dose-optimization program which includes automated exposure control, adjustment of the mA and/or kV according to patient size and/or use of iterative reconstruction technique. COMPARISON:  CT head 08/30/2021 and CT cervical spine 08/30/2021 FINDINGS: CT HEAD FINDINGS Brain: No intracranial hemorrhage, mass effect, or evidence of acute infarct. No hydrocephalus. No extra-axial fluid collection. Generalized cerebral atrophy. Ill-defined hypoattenuation within the cerebral white matter is nonspecific but consistent with chronic small vessel ischemic disease. Chronic bilateral lacunar infarcts in the basal ganglia. Vascular: No hyperdense vessel. Intracranial arterial calcification. Skull: No fracture or focal lesion. Sinuses/Orbits: No acute finding. Paranasal sinuses and mastoid air cells are well aerated. Other: None. CT CERVICAL SPINE FINDINGS Alignment: No  evidence of traumatic malalignment. Skull base and vertebrae: No acute fracture. No primary bone lesion or focal pathologic process. Soft tissues and spinal canal: No prevertebral fluid or swelling. No visible canal hematoma. Disc levels: Multilevel spondylosis, disc space height loss, and degenerative endplate changes greatest at C4-C5 and C5-C6 where it is moderate. No severe spinal canal narrowing. Upper chest: Biapical pleural-parenchymal scarring greater on the right. No acute abnormality. Other: None. IMPRESSION: 1. No acute intracranial abnormality. Generalized atrophy and small vessel white matter disease. 2. No acute fracture in the cervical spine. Multilevel degenerative spondylosis. Electronically Signed   By: Minerva Fester M.D.   On: 02/19/2023 18:15   CT Cervical Spine Wo Contrast  Result Date: 02/19/2023 CLINICAL DATA:  Multiple falls over the last few days that are unwitnessed. History of dementia and Parkinson's disease EXAM: CT HEAD WITHOUT CONTRAST CT CERVICAL SPINE WITHOUT CONTRAST TECHNIQUE: Multidetector CT imaging of the head and cervical spine was performed following the standard protocol without intravenous contrast. Multiplanar CT image reconstructions of the cervical spine were also generated. RADIATION DOSE REDUCTION: This exam was performed according to the departmental dose-optimization program which includes automated exposure control, adjustment of the mA and/or kV according to patient size and/or use of iterative reconstruction technique. COMPARISON:  CT head 08/30/2021 and CT cervical spine 08/30/2021 FINDINGS: CT HEAD FINDINGS Brain: No intracranial hemorrhage, mass effect, or evidence of acute infarct. No hydrocephalus. No extra-axial fluid collection. Generalized cerebral atrophy. Ill-defined hypoattenuation within the cerebral white matter is nonspecific but consistent with chronic small vessel ischemic disease. Chronic bilateral lacunar infarcts in the basal ganglia. Vascular: No hyperdense vessel. Intracranial arterial calcification. Skull: No fracture or focal lesion. Sinuses/Orbits: No acute finding. Paranasal sinuses and mastoid air cells are well aerated. Other: None. CT CERVICAL SPINE FINDINGS Alignment: No evidence of traumatic malalignment. Skull base and vertebrae: No acute fracture. No primary bone lesion or focal pathologic process. Soft tissues and spinal canal: No prevertebral fluid or swelling. No visible canal hematoma. Disc levels: Multilevel spondylosis, disc space height loss, and degenerative endplate changes greatest at C4-C5 and C5-C6 where it is moderate. No severe spinal canal narrowing. Upper chest: Biapical pleural-parenchymal scarring greater on the right. No acute  abnormality. Other: None. IMPRESSION: 1. No acute intracranial abnormality. Generalized atrophy and small vessel white matter disease. 2. No acute fracture in the cervical spine. Multilevel degenerative spondylosis. Electronically Signed   By: Minerva Fester M.D.   On: 02/19/2023 18:15    Microbiology: Results for orders placed or performed during the hospital encounter of 12/28/20  Culture, blood (routine x 2)     Status: None   Collection Time: 12/28/20  9:40 PM   Specimen: BLOOD  Result Value Ref Range Status   Specimen Description BLOOD SITE NOT SPECIFIED  Final   Special Requests   Final    BOTTLES DRAWN AEROBIC AND ANAEROBIC Blood Culture adequate volume   Culture   Final    NO GROWTH 5 DAYS Performed at [facility], [address] [place], [place] [address]    Report Status 01/02/2021 FINAL  Final  Culture, blood (routine x 2)     Status: None   Collection Time: 12/28/20  9:40 PM   Specimen: BLOOD  Result Value Ref Range Status   Specimen Description BLOOD SITE NOT SPECIFIED  Final   Special Requests   Final    BOTTLES DRAWN AEROBIC AND ANAEROBIC Blood Culture adequate volume   Culture   Final  NO GROWTH 5 DAYS Performed at [facility], [address], [place], [place] [address]    Report Status 01/02/2021 FINAL  Final  Resp Panel by RT-PCR (Flu A&B, Covid) Nasopharyngeal Swab     Status: None   Collection Time: 12/28/20 10:10 PM   Specimen: Nasopharyngeal Swab; Nasopharyngeal(NP) swabs in vial transport medium  Result Value Ref Range Status   SARS Coronavirus 2 by RT PCR NEGATIVE NEGATIVE Final    Comment: (NOTE) SARS-CoV-2 target nucleic acids are NOT DETECTED.  The SARS-CoV-2 RNA is generally detectable in upper respiratory specimens during the acute phase of infection. The lowest concentration of SARS-CoV-2 viral copies this assay can detect is 138 copies/mL. A negative result does not preclude SARS-Cov-2 infection and should not be used as the  sole basis for treatment or other patient management decisions. A negative result may occur with  improper specimen collection/handling, submission of specimen other than nasopharyngeal swab, presence of viral mutation(s) within the areas targeted by this assay, and inadequate number of viral copies(<138 copies/mL). A negative result must be combined with clinical observations, patient history, and epidemiological information. The expected result is Negative.  Fact Sheet for Patients:  BloggerCourse.com  Fact Sheet for Healthcare Providers:  SeriousBroker.it  This test is no t yet approved or cleared by the [place] FDA and  has been authorized for detection and/or diagnosis of SARS-CoV-2 by FDA under an Emergency Use Authorization (EUA). This EUA will remain  in effect (meaning this test can be used) for the duration of the COVID-19 declaration under Section 564(b)(1) of the Act, 21 U.S.C.section 360bbb-3(b)(1), unless the authorization is terminated  or revoked sooner.       Influenza A by PCR NEGATIVE NEGATIVE Final   Influenza B by PCR NEGATIVE NEGATIVE Final    Comment: (NOTE) The Xpert Xpress SARS-CoV-2/FLU/RSV plus assay is intended as an aid in the diagnosis of influenza from Nasopharyngeal swab specimens and should not be used as a sole basis for treatment. Nasal washings and aspirates are unacceptable for Xpert Xpress SARS-CoV-2/FLU/RSV testing.  Fact Sheet for Patients: BloggerCourse.com  Fact Sheet for Healthcare Providers: SeriousBroker.it  This test is not yet approved or cleared by the [place] FDA and has been authorized for detection and/or diagnosis of SARS-CoV-2 by FDA under an Emergency Use Authorization (EUA). This EUA will remain in effect (meaning this test can be used) for the duration of the COVID-19 declaration under Section 564(b)(1) of the  Act, 21 U.S.C. section 360bbb-3(b)(1), unless the authorization is terminated or revoked.  Performed at [facility], [address] [place], [place] [address]   Culture, Urine     Status: None   Collection Time: 12/30/20  5:45 AM   Specimen: Urine, Catheterized  Result Value Ref Range Status   Specimen Description URINE, CATHETERIZED  Final   Special Requests NONE  Final   Culture   Final    NO GROWTH Performed at [facility], [address] [place], [place] [address]    Report Status 12/31/2020 FINAL  Final  SARS CORONAVIRUS 2 (TAT 6-24 HRS) Nasopharyngeal Nasopharyngeal Swab     Status: None   Collection Time: 01/03/21  2:52 PM   Specimen: Nasopharyngeal Swab  Result Value Ref Range Status   SARS Coronavirus 2 NEGATIVE NEGATIVE Final    Comment: (NOTE) SARS-CoV-2 target nucleic acids are NOT DETECTED.  The SARS-CoV-2 RNA is generally detectable in upper and lower respiratory specimens during the acute phase  of infection. Negative results do not preclude SARS-CoV-2 infection, do not rule out co-infections with other pathogens, and should not be used as the sole basis for treatment or other patient management decisions. Negative results must be combined with clinical observations, patient history, and epidemiological information. The expected result is Negative.  Fact Sheet for Patients: HairSlick.no  Fact Sheet for Healthcare Providers: quierodirigir.com  This test is not yet approved or cleared by the [place] FDA and  has been authorized for detection and/or diagnosis of SARS-CoV-2 by FDA under an Emergency Use Authorization (EUA). This EUA will remain  in effect (meaning this test can be used) for the duration of the COVID-19 declaration under Se ction 564(b)(1) of the Act, 21 U.S.C. section 360bbb-3(b)(1), unless the authorization is terminated or revoked sooner.  Performed at [facility], [address] [place], [place] [address]     Labs: CBC: Recent Labs  Lab 02/19/23 1732 02/21/23 0417  WBC 6.8 5.0  NEUTROABS 3.8 3.0  HGB 13.2 12.2  HCT 40.8 37.5  MCV 95.6 94.9  PLT 232 188   Basic Metabolic Panel: Recent Labs  Lab 02/19/23 1732 02/20/23 0421 02/21/23 0417  NA 142 139 136  K 2.4* 3.8 4.1  CL 106 108 107  CO2 26 25 24   GLUCOSE 92 95 111*  BUN 19 16 17   CREATININE 0.78 0.58 0.66  CALCIUM 9.5 8.7* 8.8*  MG  --   --  2.2  PHOS  --   --  3.2   Liver Function Tests: Recent Labs  Lab 02/21/23 0417  AST 15  ALT <5  ALKPHOS 58  BILITOT 0.6  PROT 6.0*  ALBUMIN 3.6   CBG: No results for input(s): "GLUCAP" in the last 168 hours.  Discharge time spent: greater than 30 minutes.  Signed: Brendia Sacks, MD [facility] 02/23/2023

## 2023-02-23 NOTE — Plan of Care (Addendum)
Patient discharge to SNF. Left unit via stretcher accompanied by BLS transporter x2. No signs or symptoms of acute distress at the time of discharge. Problem: Education: Goal: Knowledge of General Education information will improve Description: Including pain rating scale, medication(s)/side effects and non-pharmacologic comfort measures Outcome: Adequate for Discharge   Problem: Health Behavior/Discharge Planning: Goal: Ability to manage health-related needs will improve Outcome: Adequate for Discharge   Problem: Clinical Measurements: Goal: Ability to maintain clinical measurements within normal limits will improve Outcome: Adequate for Discharge Goal: Will remain free from infection Outcome: Adequate for Discharge Goal: Diagnostic test results will improve Outcome: Adequate for Discharge Goal: Respiratory complications will improve Outcome: Adequate for Discharge Goal: Cardiovascular complication will be avoided Outcome: Adequate for Discharge   Problem: Activity: Goal: Risk for activity intolerance will decrease Outcome: Adequate for Discharge   Problem: Nutrition: Goal: Adequate nutrition will be maintained Outcome: Adequate for Discharge   Problem: Coping: Goal: Level of anxiety will decrease Outcome: Adequate for Discharge   Problem: Elimination: Goal: Will not experience complications related to bowel motility Outcome: Adequate for Discharge Goal: Will not experience complications related to urinary retention Outcome: Adequate for Discharge   Problem: Pain Managment: Goal: General experience of comfort will improve Outcome: Adequate for Discharge   Problem: Safety: Goal: Ability to remain free from injury will improve Outcome: Adequate for Discharge   Problem: Skin Integrity: Goal: Risk for impaired skin integrity will decrease Outcome: Adequate for Discharge

## 2023-02-23 NOTE — TOC Transition Note (Addendum)
Transition of Care [facility]) - CM/SW Discharge Note   Patient Details  Name: Hannah Fisher MRN: [id number] Date of Birth: September 05, 1951  [facility]) CM/SW Contact:  Adrian Prows, RN Phone Number: 02/23/2023, 1:36 PM   Clinical Narrative:    D/C orders received; Star at [facility] notified; she will call back w/ RM # and call report #; pt's dtr Maud Deed [phone]) notified and she says pt will need transport;; d/c instructions and SNF transfer report sent via SNF hub; awaiting call back  -[phone]- pt assigned to RM # [id number], Call report # [phone]; pt notified and agrees to d/c plan; [facility] called at [phone]; spoke w/ Pearm; no TOC needs    Final next level of care: Skilled Nursing Facility Barriers to Discharge: No Barriers Identified   Patient Goals and CMS Choice      Discharge Placement                Patient chooses bed at: [facility] Patient to be transferred to facility by: [facility] Name of family member notified: Maud Deed (dtr) [phone] Patient and family notified of of transfer: 02/23/23  Discharge Plan and Services Additional resources added to the After Visit Summary for     Discharge Planning Services: NA                                 Social Determinants of Health (SDOH) Interventions SDOH Screenings   Food Insecurity: Food Insecurity Present (02/19/2023)  Housing: Low Risk  (02/19/2023)  Transportation Needs: No Transportation Needs (02/19/2023)  Utilities: At Risk (02/19/2023)  Tobacco Use: Low Risk  (02/19/2023)     Readmission Risk Interventions     No data to display

## 2023-02-23 NOTE — TOC Progression Note (Signed)
Transition of Care [facility]) - Progression Note    Patient Details  Name: Hannah Fisher MRN: [id number] Date of Birth: [birth year]  [facility]) CM/SW Contact  Adrian Prows, RN Phone Number: 02/23/2023, 12:11 PM  Clinical Narrative:    Sherron Monday w/ Star, Admissions Director at [facility]; she says ins Berkley Harvey has been received 02/22/23 - 02/27/23; she also says pt can be admitted today or over weekend; Dr Irene Limbo notified.   Expected Discharge Plan: Skilled Nursing Facility Barriers to Discharge: Continued Medical Work up  Expected Discharge Plan and Services   Discharge Planning Services: NA   Living arrangements for the past 2 months: Single Family Home                                       Social Determinants of Health (SDOH) Interventions SDOH Screenings   Food Insecurity: Food Insecurity Present (02/19/2023)  Housing: Low Risk  (02/19/2023)  Transportation Needs: No Transportation Needs (02/19/2023)  Utilities: At Risk (02/19/2023)  Tobacco Use: Low Risk  (02/19/2023)    Readmission Risk Interventions     No data to display

## 2023-02-23 NOTE — TOC Progression Note (Signed)
Transition of Care [facility]) - Progression Note    Patient Details  Name: LENEA CHERY MRN: [id number] Date of Birth: 1950/11/30  Transition of Care [facility]) CM/SW Contact  Adrian Prows, RN Phone Number: 02/23/2023, 9:34 AM  Clinical Narrative:    LVM for Lawerance Cruel, Admissions Director at [facility], to see if ins auth received; awaiting return call.   Expected Discharge Plan: Skilled Nursing Facility Barriers to Discharge: Continued Medical Work up  Expected Discharge Plan and Services   Discharge Planning Services: NA   Living arrangements for the past 2 months: Single Family Home                                       Social Determinants of Health (SDOH) Interventions SDOH Screenings   Food Insecurity: Food Insecurity Present (02/19/2023)  Housing: Low Risk  (02/19/2023)  Transportation Needs: No Transportation Needs (02/19/2023)  Utilities: At Risk (02/19/2023)  Tobacco Use: Low Risk  (02/19/2023)    Readmission Risk Interventions     No data to display

## 2023-02-27 DIAGNOSIS — R531 Weakness: Secondary | ICD-10-CM | POA: Diagnosis not present

## 2023-02-27 DIAGNOSIS — E876 Hypokalemia: Secondary | ICD-10-CM | POA: Diagnosis not present

## 2023-02-27 DIAGNOSIS — Z681 Body mass index (BMI) 19 or less, adult: Secondary | ICD-10-CM | POA: Diagnosis not present

## 2023-02-27 DIAGNOSIS — G20A1 Parkinson's disease without dyskinesia, without mention of fluctuations: Secondary | ICD-10-CM | POA: Diagnosis not present

## 2023-02-27 DIAGNOSIS — I10 Essential (primary) hypertension: Secondary | ICD-10-CM | POA: Diagnosis not present

## 2023-02-27 DIAGNOSIS — M810 Age-related osteoporosis without current pathological fracture: Secondary | ICD-10-CM | POA: Diagnosis not present

## 2023-02-27 DIAGNOSIS — R627 Adult failure to thrive: Secondary | ICD-10-CM | POA: Diagnosis not present

## 2023-02-27 DIAGNOSIS — E43 Unspecified severe protein-calorie malnutrition: Secondary | ICD-10-CM | POA: Diagnosis not present

## 2023-02-28 DIAGNOSIS — E46 Unspecified protein-calorie malnutrition: Secondary | ICD-10-CM | POA: Diagnosis not present

## 2023-02-28 DIAGNOSIS — Z9181 History of falling: Secondary | ICD-10-CM | POA: Diagnosis not present

## 2023-02-28 DIAGNOSIS — G20A1 Parkinson's disease without dyskinesia, without mention of fluctuations: Secondary | ICD-10-CM | POA: Diagnosis not present

## 2023-02-28 DIAGNOSIS — R5381 Other malaise: Secondary | ICD-10-CM | POA: Diagnosis not present

## 2023-02-28 DIAGNOSIS — F1320 Sedative, hypnotic or anxiolytic dependence, uncomplicated: Secondary | ICD-10-CM | POA: Diagnosis not present

## 2023-02-28 DIAGNOSIS — I10 Essential (primary) hypertension: Secondary | ICD-10-CM | POA: Diagnosis not present

## 2023-02-28 DIAGNOSIS — R4189 Other symptoms and signs involving cognitive functions and awareness: Secondary | ICD-10-CM | POA: Diagnosis not present

## 2023-02-28 DIAGNOSIS — F419 Anxiety disorder, unspecified: Secondary | ICD-10-CM | POA: Diagnosis not present

## 2023-02-28 DIAGNOSIS — E639 Nutritional deficiency, unspecified: Secondary | ICD-10-CM | POA: Diagnosis not present

## 2023-02-28 DIAGNOSIS — M810 Age-related osteoporosis without current pathological fracture: Secondary | ICD-10-CM | POA: Diagnosis not present

## 2023-02-28 DIAGNOSIS — M6281 Muscle weakness (generalized): Secondary | ICD-10-CM | POA: Diagnosis not present

## 2023-02-28 DIAGNOSIS — Z681 Body mass index (BMI) 19 or less, adult: Secondary | ICD-10-CM | POA: Diagnosis not present

## 2023-02-28 DIAGNOSIS — R296 Repeated falls: Secondary | ICD-10-CM | POA: Diagnosis not present

## 2023-03-05 DIAGNOSIS — M810 Age-related osteoporosis without current pathological fracture: Secondary | ICD-10-CM | POA: Diagnosis not present

## 2023-03-05 DIAGNOSIS — G20A1 Parkinson's disease without dyskinesia, without mention of fluctuations: Secondary | ICD-10-CM | POA: Diagnosis not present

## 2023-03-05 DIAGNOSIS — M6281 Muscle weakness (generalized): Secondary | ICD-10-CM | POA: Diagnosis not present

## 2023-03-05 DIAGNOSIS — Z9181 History of falling: Secondary | ICD-10-CM | POA: Diagnosis not present

## 2023-03-05 DIAGNOSIS — E46 Unspecified protein-calorie malnutrition: Secondary | ICD-10-CM | POA: Diagnosis not present

## 2023-03-05 DIAGNOSIS — F419 Anxiety disorder, unspecified: Secondary | ICD-10-CM | POA: Diagnosis not present

## 2023-03-07 DIAGNOSIS — E46 Unspecified protein-calorie malnutrition: Secondary | ICD-10-CM | POA: Diagnosis not present

## 2023-03-07 DIAGNOSIS — M6281 Muscle weakness (generalized): Secondary | ICD-10-CM | POA: Diagnosis not present

## 2023-03-07 DIAGNOSIS — M810 Age-related osteoporosis without current pathological fracture: Secondary | ICD-10-CM | POA: Diagnosis not present

## 2023-03-07 DIAGNOSIS — G20A1 Parkinson's disease without dyskinesia, without mention of fluctuations: Secondary | ICD-10-CM | POA: Diagnosis not present

## 2023-03-07 DIAGNOSIS — Z9181 History of falling: Secondary | ICD-10-CM | POA: Diagnosis not present

## 2023-03-07 DIAGNOSIS — F419 Anxiety disorder, unspecified: Secondary | ICD-10-CM | POA: Diagnosis not present

## 2023-03-09 DIAGNOSIS — G20A1 Parkinson's disease without dyskinesia, without mention of fluctuations: Secondary | ICD-10-CM | POA: Diagnosis not present

## 2023-03-09 DIAGNOSIS — M6281 Muscle weakness (generalized): Secondary | ICD-10-CM | POA: Diagnosis not present

## 2023-03-09 DIAGNOSIS — R2689 Other abnormalities of gait and mobility: Secondary | ICD-10-CM | POA: Diagnosis not present

## 2023-03-09 DIAGNOSIS — R1311 Dysphagia, oral phase: Secondary | ICD-10-CM | POA: Diagnosis not present

## 2023-03-09 DIAGNOSIS — R41841 Cognitive communication deficit: Secondary | ICD-10-CM | POA: Diagnosis not present

## 2023-03-10 DIAGNOSIS — R1311 Dysphagia, oral phase: Secondary | ICD-10-CM | POA: Diagnosis not present

## 2023-03-10 DIAGNOSIS — M6281 Muscle weakness (generalized): Secondary | ICD-10-CM | POA: Diagnosis not present

## 2023-03-10 DIAGNOSIS — G20A1 Parkinson's disease without dyskinesia, without mention of fluctuations: Secondary | ICD-10-CM | POA: Diagnosis not present

## 2023-03-10 DIAGNOSIS — R41841 Cognitive communication deficit: Secondary | ICD-10-CM | POA: Diagnosis not present

## 2023-03-10 DIAGNOSIS — R2689 Other abnormalities of gait and mobility: Secondary | ICD-10-CM | POA: Diagnosis not present

## 2023-03-11 DIAGNOSIS — R1311 Dysphagia, oral phase: Secondary | ICD-10-CM | POA: Diagnosis not present

## 2023-03-11 DIAGNOSIS — M6281 Muscle weakness (generalized): Secondary | ICD-10-CM | POA: Diagnosis not present

## 2023-03-11 DIAGNOSIS — R2689 Other abnormalities of gait and mobility: Secondary | ICD-10-CM | POA: Diagnosis not present

## 2023-03-11 DIAGNOSIS — G20A1 Parkinson's disease without dyskinesia, without mention of fluctuations: Secondary | ICD-10-CM | POA: Diagnosis not present

## 2023-03-11 DIAGNOSIS — R41841 Cognitive communication deficit: Secondary | ICD-10-CM | POA: Diagnosis not present

## 2023-03-12 DIAGNOSIS — G20A1 Parkinson's disease without dyskinesia, without mention of fluctuations: Secondary | ICD-10-CM | POA: Diagnosis not present

## 2023-03-12 DIAGNOSIS — M810 Age-related osteoporosis without current pathological fracture: Secondary | ICD-10-CM | POA: Diagnosis not present

## 2023-03-12 DIAGNOSIS — F419 Anxiety disorder, unspecified: Secondary | ICD-10-CM | POA: Diagnosis not present

## 2023-03-12 DIAGNOSIS — M6281 Muscle weakness (generalized): Secondary | ICD-10-CM | POA: Diagnosis not present

## 2023-03-12 DIAGNOSIS — R41841 Cognitive communication deficit: Secondary | ICD-10-CM | POA: Diagnosis not present

## 2023-03-12 DIAGNOSIS — R2689 Other abnormalities of gait and mobility: Secondary | ICD-10-CM | POA: Diagnosis not present

## 2023-03-12 DIAGNOSIS — Z9181 History of falling: Secondary | ICD-10-CM | POA: Diagnosis not present

## 2023-03-12 DIAGNOSIS — E46 Unspecified protein-calorie malnutrition: Secondary | ICD-10-CM | POA: Diagnosis not present

## 2023-03-12 DIAGNOSIS — R1311 Dysphagia, oral phase: Secondary | ICD-10-CM | POA: Diagnosis not present

## 2023-03-13 DIAGNOSIS — M6281 Muscle weakness (generalized): Secondary | ICD-10-CM | POA: Diagnosis not present

## 2023-03-13 DIAGNOSIS — R41841 Cognitive communication deficit: Secondary | ICD-10-CM | POA: Diagnosis not present

## 2023-03-13 DIAGNOSIS — G20A1 Parkinson's disease without dyskinesia, without mention of fluctuations: Secondary | ICD-10-CM | POA: Diagnosis not present

## 2023-03-13 DIAGNOSIS — R1311 Dysphagia, oral phase: Secondary | ICD-10-CM | POA: Diagnosis not present

## 2023-03-13 DIAGNOSIS — R2689 Other abnormalities of gait and mobility: Secondary | ICD-10-CM | POA: Diagnosis not present

## 2023-03-14 DIAGNOSIS — F419 Anxiety disorder, unspecified: Secondary | ICD-10-CM | POA: Diagnosis not present

## 2023-03-14 DIAGNOSIS — I10 Essential (primary) hypertension: Secondary | ICD-10-CM | POA: Diagnosis not present

## 2023-03-14 DIAGNOSIS — E876 Hypokalemia: Secondary | ICD-10-CM | POA: Diagnosis not present

## 2023-03-14 DIAGNOSIS — E639 Nutritional deficiency, unspecified: Secondary | ICD-10-CM | POA: Diagnosis not present

## 2023-03-14 DIAGNOSIS — G20A1 Parkinson's disease without dyskinesia, without mention of fluctuations: Secondary | ICD-10-CM | POA: Diagnosis not present

## 2023-03-14 DIAGNOSIS — E43 Unspecified severe protein-calorie malnutrition: Secondary | ICD-10-CM | POA: Diagnosis not present

## 2023-03-14 DIAGNOSIS — R296 Repeated falls: Secondary | ICD-10-CM | POA: Diagnosis not present

## 2023-03-19 DIAGNOSIS — M810 Age-related osteoporosis without current pathological fracture: Secondary | ICD-10-CM | POA: Diagnosis not present

## 2023-03-19 DIAGNOSIS — M19041 Primary osteoarthritis, right hand: Secondary | ICD-10-CM | POA: Diagnosis not present

## 2023-03-19 DIAGNOSIS — I10 Essential (primary) hypertension: Secondary | ICD-10-CM | POA: Diagnosis not present

## 2023-03-19 DIAGNOSIS — G20B2 Parkinson's disease with dyskinesia, with fluctuations: Secondary | ICD-10-CM | POA: Diagnosis not present

## 2023-03-19 DIAGNOSIS — M170 Bilateral primary osteoarthritis of knee: Secondary | ICD-10-CM | POA: Diagnosis not present

## 2023-03-19 DIAGNOSIS — F419 Anxiety disorder, unspecified: Secondary | ICD-10-CM | POA: Diagnosis not present

## 2023-03-19 DIAGNOSIS — E876 Hypokalemia: Secondary | ICD-10-CM | POA: Diagnosis not present

## 2023-03-19 DIAGNOSIS — E43 Unspecified severe protein-calorie malnutrition: Secondary | ICD-10-CM | POA: Diagnosis not present

## 2023-03-19 DIAGNOSIS — M19042 Primary osteoarthritis, left hand: Secondary | ICD-10-CM | POA: Diagnosis not present

## 2023-03-21 DIAGNOSIS — G20A1 Parkinson's disease without dyskinesia, without mention of fluctuations: Secondary | ICD-10-CM | POA: Diagnosis not present

## 2023-03-21 DIAGNOSIS — M5430 Sciatica, unspecified side: Secondary | ICD-10-CM | POA: Diagnosis not present

## 2023-03-21 DIAGNOSIS — M810 Age-related osteoporosis without current pathological fracture: Secondary | ICD-10-CM | POA: Diagnosis not present

## 2023-03-21 DIAGNOSIS — M6281 Muscle weakness (generalized): Secondary | ICD-10-CM | POA: Diagnosis not present

## 2023-03-26 DIAGNOSIS — G20B2 Parkinson's disease with dyskinesia, with fluctuations: Secondary | ICD-10-CM | POA: Diagnosis not present

## 2023-04-03 DIAGNOSIS — E7800 Pure hypercholesterolemia, unspecified: Secondary | ICD-10-CM | POA: Diagnosis not present

## 2023-04-03 DIAGNOSIS — M810 Age-related osteoporosis without current pathological fracture: Secondary | ICD-10-CM | POA: Diagnosis not present

## 2023-04-03 DIAGNOSIS — I10 Essential (primary) hypertension: Secondary | ICD-10-CM | POA: Diagnosis not present

## 2023-04-03 DIAGNOSIS — F419 Anxiety disorder, unspecified: Secondary | ICD-10-CM | POA: Diagnosis not present

## 2023-04-03 DIAGNOSIS — G20A1 Parkinson's disease without dyskinesia, without mention of fluctuations: Secondary | ICD-10-CM | POA: Diagnosis not present

## 2023-04-03 DIAGNOSIS — M19042 Primary osteoarthritis, left hand: Secondary | ICD-10-CM | POA: Diagnosis not present

## 2023-04-03 DIAGNOSIS — M170 Bilateral primary osteoarthritis of knee: Secondary | ICD-10-CM | POA: Diagnosis not present

## 2023-04-03 DIAGNOSIS — E43 Unspecified severe protein-calorie malnutrition: Secondary | ICD-10-CM | POA: Diagnosis not present

## 2023-04-03 DIAGNOSIS — M19041 Primary osteoarthritis, right hand: Secondary | ICD-10-CM | POA: Diagnosis not present

## 2023-04-04 DIAGNOSIS — F419 Anxiety disorder, unspecified: Secondary | ICD-10-CM | POA: Diagnosis not present

## 2023-04-04 DIAGNOSIS — G20A1 Parkinson's disease without dyskinesia, without mention of fluctuations: Secondary | ICD-10-CM | POA: Diagnosis not present

## 2023-04-04 DIAGNOSIS — M810 Age-related osteoporosis without current pathological fracture: Secondary | ICD-10-CM | POA: Diagnosis not present

## 2023-04-04 DIAGNOSIS — M19042 Primary osteoarthritis, left hand: Secondary | ICD-10-CM | POA: Diagnosis not present

## 2023-04-04 DIAGNOSIS — I10 Essential (primary) hypertension: Secondary | ICD-10-CM | POA: Diagnosis not present

## 2023-04-04 DIAGNOSIS — E43 Unspecified severe protein-calorie malnutrition: Secondary | ICD-10-CM | POA: Diagnosis not present

## 2023-04-04 DIAGNOSIS — M19041 Primary osteoarthritis, right hand: Secondary | ICD-10-CM | POA: Diagnosis not present

## 2023-04-04 DIAGNOSIS — M170 Bilateral primary osteoarthritis of knee: Secondary | ICD-10-CM | POA: Diagnosis not present

## 2023-04-04 DIAGNOSIS — E7800 Pure hypercholesterolemia, unspecified: Secondary | ICD-10-CM | POA: Diagnosis not present

## 2023-04-11 DIAGNOSIS — M810 Age-related osteoporosis without current pathological fracture: Secondary | ICD-10-CM | POA: Diagnosis not present

## 2023-04-11 DIAGNOSIS — G20A1 Parkinson's disease without dyskinesia, without mention of fluctuations: Secondary | ICD-10-CM | POA: Diagnosis not present

## 2023-04-11 DIAGNOSIS — M170 Bilateral primary osteoarthritis of knee: Secondary | ICD-10-CM | POA: Diagnosis not present

## 2023-04-11 DIAGNOSIS — M19042 Primary osteoarthritis, left hand: Secondary | ICD-10-CM | POA: Diagnosis not present

## 2023-04-11 DIAGNOSIS — E7800 Pure hypercholesterolemia, unspecified: Secondary | ICD-10-CM | POA: Diagnosis not present

## 2023-04-11 DIAGNOSIS — I10 Essential (primary) hypertension: Secondary | ICD-10-CM | POA: Diagnosis not present

## 2023-04-11 DIAGNOSIS — F419 Anxiety disorder, unspecified: Secondary | ICD-10-CM | POA: Diagnosis not present

## 2023-04-11 DIAGNOSIS — E43 Unspecified severe protein-calorie malnutrition: Secondary | ICD-10-CM | POA: Diagnosis not present

## 2023-04-11 DIAGNOSIS — M19041 Primary osteoarthritis, right hand: Secondary | ICD-10-CM | POA: Diagnosis not present

## 2023-04-12 DIAGNOSIS — M810 Age-related osteoporosis without current pathological fracture: Secondary | ICD-10-CM | POA: Diagnosis not present

## 2023-04-12 DIAGNOSIS — I10 Essential (primary) hypertension: Secondary | ICD-10-CM | POA: Diagnosis not present

## 2023-04-12 DIAGNOSIS — E7800 Pure hypercholesterolemia, unspecified: Secondary | ICD-10-CM | POA: Diagnosis not present

## 2023-04-12 DIAGNOSIS — G20A1 Parkinson's disease without dyskinesia, without mention of fluctuations: Secondary | ICD-10-CM | POA: Diagnosis not present

## 2023-04-12 DIAGNOSIS — M19042 Primary osteoarthritis, left hand: Secondary | ICD-10-CM | POA: Diagnosis not present

## 2023-04-12 DIAGNOSIS — M170 Bilateral primary osteoarthritis of knee: Secondary | ICD-10-CM | POA: Diagnosis not present

## 2023-04-12 DIAGNOSIS — M19041 Primary osteoarthritis, right hand: Secondary | ICD-10-CM | POA: Diagnosis not present

## 2023-04-12 DIAGNOSIS — E43 Unspecified severe protein-calorie malnutrition: Secondary | ICD-10-CM | POA: Diagnosis not present

## 2023-04-12 DIAGNOSIS — F419 Anxiety disorder, unspecified: Secondary | ICD-10-CM | POA: Diagnosis not present

## 2023-04-17 DIAGNOSIS — E43 Unspecified severe protein-calorie malnutrition: Secondary | ICD-10-CM | POA: Diagnosis not present

## 2023-04-17 DIAGNOSIS — I10 Essential (primary) hypertension: Secondary | ICD-10-CM | POA: Diagnosis not present

## 2023-04-17 DIAGNOSIS — E7800 Pure hypercholesterolemia, unspecified: Secondary | ICD-10-CM | POA: Diagnosis not present

## 2023-04-17 DIAGNOSIS — G20A1 Parkinson's disease without dyskinesia, without mention of fluctuations: Secondary | ICD-10-CM | POA: Diagnosis not present

## 2023-04-17 DIAGNOSIS — M19042 Primary osteoarthritis, left hand: Secondary | ICD-10-CM | POA: Diagnosis not present

## 2023-04-17 DIAGNOSIS — M19041 Primary osteoarthritis, right hand: Secondary | ICD-10-CM | POA: Diagnosis not present

## 2023-04-17 DIAGNOSIS — M170 Bilateral primary osteoarthritis of knee: Secondary | ICD-10-CM | POA: Diagnosis not present

## 2023-04-17 DIAGNOSIS — F419 Anxiety disorder, unspecified: Secondary | ICD-10-CM | POA: Diagnosis not present

## 2023-04-17 DIAGNOSIS — M810 Age-related osteoporosis without current pathological fracture: Secondary | ICD-10-CM | POA: Diagnosis not present

## 2023-04-20 DIAGNOSIS — M810 Age-related osteoporosis without current pathological fracture: Secondary | ICD-10-CM | POA: Diagnosis not present

## 2023-04-20 DIAGNOSIS — M5430 Sciatica, unspecified side: Secondary | ICD-10-CM | POA: Diagnosis not present

## 2023-04-20 DIAGNOSIS — G20A1 Parkinson's disease without dyskinesia, without mention of fluctuations: Secondary | ICD-10-CM | POA: Diagnosis not present

## 2023-04-20 DIAGNOSIS — M6281 Muscle weakness (generalized): Secondary | ICD-10-CM | POA: Diagnosis not present

## 2023-04-24 DIAGNOSIS — E43 Unspecified severe protein-calorie malnutrition: Secondary | ICD-10-CM | POA: Diagnosis not present

## 2023-04-24 DIAGNOSIS — G20A1 Parkinson's disease without dyskinesia, without mention of fluctuations: Secondary | ICD-10-CM | POA: Diagnosis not present

## 2023-04-24 DIAGNOSIS — M19041 Primary osteoarthritis, right hand: Secondary | ICD-10-CM | POA: Diagnosis not present

## 2023-04-24 DIAGNOSIS — M19042 Primary osteoarthritis, left hand: Secondary | ICD-10-CM | POA: Diagnosis not present

## 2023-04-24 DIAGNOSIS — E7800 Pure hypercholesterolemia, unspecified: Secondary | ICD-10-CM | POA: Diagnosis not present

## 2023-04-24 DIAGNOSIS — M810 Age-related osteoporosis without current pathological fracture: Secondary | ICD-10-CM | POA: Diagnosis not present

## 2023-04-24 DIAGNOSIS — I10 Essential (primary) hypertension: Secondary | ICD-10-CM | POA: Diagnosis not present

## 2023-04-24 DIAGNOSIS — F419 Anxiety disorder, unspecified: Secondary | ICD-10-CM | POA: Diagnosis not present

## 2023-04-24 DIAGNOSIS — M170 Bilateral primary osteoarthritis of knee: Secondary | ICD-10-CM | POA: Diagnosis not present

## 2023-04-25 DIAGNOSIS — I10 Essential (primary) hypertension: Secondary | ICD-10-CM | POA: Diagnosis not present

## 2023-04-25 DIAGNOSIS — M19042 Primary osteoarthritis, left hand: Secondary | ICD-10-CM | POA: Diagnosis not present

## 2023-04-25 DIAGNOSIS — E43 Unspecified severe protein-calorie malnutrition: Secondary | ICD-10-CM | POA: Diagnosis not present

## 2023-04-25 DIAGNOSIS — F419 Anxiety disorder, unspecified: Secondary | ICD-10-CM | POA: Diagnosis not present

## 2023-04-25 DIAGNOSIS — M19041 Primary osteoarthritis, right hand: Secondary | ICD-10-CM | POA: Diagnosis not present

## 2023-04-25 DIAGNOSIS — M170 Bilateral primary osteoarthritis of knee: Secondary | ICD-10-CM | POA: Diagnosis not present

## 2023-04-25 DIAGNOSIS — G20A1 Parkinson's disease without dyskinesia, without mention of fluctuations: Secondary | ICD-10-CM | POA: Diagnosis not present

## 2023-04-25 DIAGNOSIS — M810 Age-related osteoporosis without current pathological fracture: Secondary | ICD-10-CM | POA: Diagnosis not present

## 2023-04-25 DIAGNOSIS — E7800 Pure hypercholesterolemia, unspecified: Secondary | ICD-10-CM | POA: Diagnosis not present

## 2023-04-27 DIAGNOSIS — F419 Anxiety disorder, unspecified: Secondary | ICD-10-CM | POA: Diagnosis not present

## 2023-04-27 DIAGNOSIS — E7800 Pure hypercholesterolemia, unspecified: Secondary | ICD-10-CM | POA: Diagnosis not present

## 2023-04-27 DIAGNOSIS — I10 Essential (primary) hypertension: Secondary | ICD-10-CM | POA: Diagnosis not present

## 2023-04-27 DIAGNOSIS — M19042 Primary osteoarthritis, left hand: Secondary | ICD-10-CM | POA: Diagnosis not present

## 2023-04-27 DIAGNOSIS — M19041 Primary osteoarthritis, right hand: Secondary | ICD-10-CM | POA: Diagnosis not present

## 2023-04-27 DIAGNOSIS — E43 Unspecified severe protein-calorie malnutrition: Secondary | ICD-10-CM | POA: Diagnosis not present

## 2023-04-27 DIAGNOSIS — G20A1 Parkinson's disease without dyskinesia, without mention of fluctuations: Secondary | ICD-10-CM | POA: Diagnosis not present

## 2023-04-27 DIAGNOSIS — M170 Bilateral primary osteoarthritis of knee: Secondary | ICD-10-CM | POA: Diagnosis not present

## 2023-04-27 DIAGNOSIS — M810 Age-related osteoporosis without current pathological fracture: Secondary | ICD-10-CM | POA: Diagnosis not present

## 2023-05-01 ENCOUNTER — Inpatient Hospital Stay: Admit: 2023-05-01 | Payer: Medicare PPO

## 2023-05-03 DIAGNOSIS — G20A1 Parkinson's disease without dyskinesia, without mention of fluctuations: Secondary | ICD-10-CM | POA: Diagnosis not present

## 2023-05-03 DIAGNOSIS — M19042 Primary osteoarthritis, left hand: Secondary | ICD-10-CM | POA: Diagnosis not present

## 2023-05-03 DIAGNOSIS — M170 Bilateral primary osteoarthritis of knee: Secondary | ICD-10-CM | POA: Diagnosis not present

## 2023-05-03 DIAGNOSIS — E7800 Pure hypercholesterolemia, unspecified: Secondary | ICD-10-CM | POA: Diagnosis not present

## 2023-05-03 DIAGNOSIS — I10 Essential (primary) hypertension: Secondary | ICD-10-CM | POA: Diagnosis not present

## 2023-05-03 DIAGNOSIS — M810 Age-related osteoporosis without current pathological fracture: Secondary | ICD-10-CM | POA: Diagnosis not present

## 2023-05-03 DIAGNOSIS — M19041 Primary osteoarthritis, right hand: Secondary | ICD-10-CM | POA: Diagnosis not present

## 2023-05-03 DIAGNOSIS — F419 Anxiety disorder, unspecified: Secondary | ICD-10-CM | POA: Diagnosis not present

## 2023-05-03 DIAGNOSIS — E43 Unspecified severe protein-calorie malnutrition: Secondary | ICD-10-CM | POA: Diagnosis not present

## 2023-05-08 DIAGNOSIS — M19042 Primary osteoarthritis, left hand: Secondary | ICD-10-CM | POA: Diagnosis not present

## 2023-05-08 DIAGNOSIS — M170 Bilateral primary osteoarthritis of knee: Secondary | ICD-10-CM | POA: Diagnosis not present

## 2023-05-08 DIAGNOSIS — E7800 Pure hypercholesterolemia, unspecified: Secondary | ICD-10-CM | POA: Diagnosis not present

## 2023-05-08 DIAGNOSIS — I10 Essential (primary) hypertension: Secondary | ICD-10-CM | POA: Diagnosis not present

## 2023-05-08 DIAGNOSIS — F419 Anxiety disorder, unspecified: Secondary | ICD-10-CM | POA: Diagnosis not present

## 2023-05-08 DIAGNOSIS — M810 Age-related osteoporosis without current pathological fracture: Secondary | ICD-10-CM | POA: Diagnosis not present

## 2023-05-08 DIAGNOSIS — G20A1 Parkinson's disease without dyskinesia, without mention of fluctuations: Secondary | ICD-10-CM | POA: Diagnosis not present

## 2023-05-08 DIAGNOSIS — M19041 Primary osteoarthritis, right hand: Secondary | ICD-10-CM | POA: Diagnosis not present

## 2023-05-08 DIAGNOSIS — E43 Unspecified severe protein-calorie malnutrition: Secondary | ICD-10-CM | POA: Diagnosis not present

## 2023-05-10 DIAGNOSIS — M810 Age-related osteoporosis without current pathological fracture: Secondary | ICD-10-CM | POA: Diagnosis not present

## 2023-05-10 DIAGNOSIS — E7800 Pure hypercholesterolemia, unspecified: Secondary | ICD-10-CM | POA: Diagnosis not present

## 2023-05-10 DIAGNOSIS — M170 Bilateral primary osteoarthritis of knee: Secondary | ICD-10-CM | POA: Diagnosis not present

## 2023-05-10 DIAGNOSIS — F419 Anxiety disorder, unspecified: Secondary | ICD-10-CM | POA: Diagnosis not present

## 2023-05-10 DIAGNOSIS — I10 Essential (primary) hypertension: Secondary | ICD-10-CM | POA: Diagnosis not present

## 2023-05-10 DIAGNOSIS — G20A1 Parkinson's disease without dyskinesia, without mention of fluctuations: Secondary | ICD-10-CM | POA: Diagnosis not present

## 2023-05-10 DIAGNOSIS — M19042 Primary osteoarthritis, left hand: Secondary | ICD-10-CM | POA: Diagnosis not present

## 2023-05-10 DIAGNOSIS — E43 Unspecified severe protein-calorie malnutrition: Secondary | ICD-10-CM | POA: Diagnosis not present

## 2023-05-10 DIAGNOSIS — M19041 Primary osteoarthritis, right hand: Secondary | ICD-10-CM | POA: Diagnosis not present

## 2023-05-15 DIAGNOSIS — I10 Essential (primary) hypertension: Secondary | ICD-10-CM | POA: Diagnosis not present

## 2023-05-15 DIAGNOSIS — E7800 Pure hypercholesterolemia, unspecified: Secondary | ICD-10-CM | POA: Diagnosis not present

## 2023-05-15 DIAGNOSIS — M19041 Primary osteoarthritis, right hand: Secondary | ICD-10-CM | POA: Diagnosis not present

## 2023-05-15 DIAGNOSIS — M170 Bilateral primary osteoarthritis of knee: Secondary | ICD-10-CM | POA: Diagnosis not present

## 2023-05-15 DIAGNOSIS — F419 Anxiety disorder, unspecified: Secondary | ICD-10-CM | POA: Diagnosis not present

## 2023-05-15 DIAGNOSIS — E43 Unspecified severe protein-calorie malnutrition: Secondary | ICD-10-CM | POA: Diagnosis not present

## 2023-05-15 DIAGNOSIS — M810 Age-related osteoporosis without current pathological fracture: Secondary | ICD-10-CM | POA: Diagnosis not present

## 2023-05-15 DIAGNOSIS — G20A1 Parkinson's disease without dyskinesia, without mention of fluctuations: Secondary | ICD-10-CM | POA: Diagnosis not present

## 2023-05-15 DIAGNOSIS — M19042 Primary osteoarthritis, left hand: Secondary | ICD-10-CM | POA: Diagnosis not present

## 2023-05-16 DIAGNOSIS — I10 Essential (primary) hypertension: Secondary | ICD-10-CM | POA: Diagnosis not present

## 2023-05-16 DIAGNOSIS — E43 Unspecified severe protein-calorie malnutrition: Secondary | ICD-10-CM | POA: Diagnosis not present

## 2023-05-16 DIAGNOSIS — M170 Bilateral primary osteoarthritis of knee: Secondary | ICD-10-CM | POA: Diagnosis not present

## 2023-05-16 DIAGNOSIS — M810 Age-related osteoporosis without current pathological fracture: Secondary | ICD-10-CM | POA: Diagnosis not present

## 2023-05-16 DIAGNOSIS — E7800 Pure hypercholesterolemia, unspecified: Secondary | ICD-10-CM | POA: Diagnosis not present

## 2023-05-16 DIAGNOSIS — M19041 Primary osteoarthritis, right hand: Secondary | ICD-10-CM | POA: Diagnosis not present

## 2023-05-16 DIAGNOSIS — M19042 Primary osteoarthritis, left hand: Secondary | ICD-10-CM | POA: Diagnosis not present

## 2023-05-16 DIAGNOSIS — F419 Anxiety disorder, unspecified: Secondary | ICD-10-CM | POA: Diagnosis not present

## 2023-05-16 DIAGNOSIS — G20A1 Parkinson's disease without dyskinesia, without mention of fluctuations: Secondary | ICD-10-CM | POA: Diagnosis not present

## 2023-05-21 DIAGNOSIS — M810 Age-related osteoporosis without current pathological fracture: Secondary | ICD-10-CM | POA: Diagnosis not present

## 2023-05-21 DIAGNOSIS — M5430 Sciatica, unspecified side: Secondary | ICD-10-CM | POA: Diagnosis not present

## 2023-05-21 DIAGNOSIS — M6281 Muscle weakness (generalized): Secondary | ICD-10-CM | POA: Diagnosis not present

## 2023-05-21 DIAGNOSIS — G20A1 Parkinson's disease without dyskinesia, without mention of fluctuations: Secondary | ICD-10-CM | POA: Diagnosis not present

## 2023-05-22 ENCOUNTER — Emergency Department (HOSPITAL_COMMUNITY): Payer: Medicare PPO

## 2023-05-22 ENCOUNTER — Encounter (HOSPITAL_COMMUNITY): Payer: Self-pay

## 2023-05-22 ENCOUNTER — Inpatient Hospital Stay (HOSPITAL_COMMUNITY)
Admission: EM | Admit: 2023-05-22 | Discharge: 2023-05-31 | DRG: 689 | Disposition: A | Discharge status: Skilled Nursing Facility | Payer: Medicare PPO | Attending: Internal Medicine | Admitting: Internal Medicine

## 2023-05-22 DIAGNOSIS — N10 Acute tubulo-interstitial nephritis: Secondary | ICD-10-CM | POA: Diagnosis present

## 2023-05-22 DIAGNOSIS — E871 Hypo-osmolality and hyponatremia: Secondary | ICD-10-CM | POA: Diagnosis not present

## 2023-05-22 DIAGNOSIS — Z79899 Other long term (current) drug therapy: Secondary | ICD-10-CM | POA: Diagnosis not present

## 2023-05-22 DIAGNOSIS — A419 Sepsis, unspecified organism: Secondary | ICD-10-CM | POA: Diagnosis present

## 2023-05-22 DIAGNOSIS — M810 Age-related osteoporosis without current pathological fracture: Secondary | ICD-10-CM | POA: Diagnosis present

## 2023-05-22 DIAGNOSIS — Z823 Family history of stroke: Secondary | ICD-10-CM | POA: Diagnosis not present

## 2023-05-22 DIAGNOSIS — H9190 Unspecified hearing loss, unspecified ear: Secondary | ICD-10-CM | POA: Diagnosis present

## 2023-05-22 DIAGNOSIS — G20A1 Parkinson's disease without dyskinesia, without mention of fluctuations: Secondary | ICD-10-CM | POA: Diagnosis present

## 2023-05-22 DIAGNOSIS — B9689 Other specified bacterial agents as the cause of diseases classified elsewhere: Secondary | ICD-10-CM | POA: Diagnosis present

## 2023-05-22 DIAGNOSIS — G20B2 Parkinson's disease with dyskinesia, with fluctuations: Secondary | ICD-10-CM | POA: Diagnosis not present

## 2023-05-22 DIAGNOSIS — Z681 Body mass index (BMI) 19 or less, adult: Secondary | ICD-10-CM

## 2023-05-22 DIAGNOSIS — I700 Atherosclerosis of aorta: Secondary | ICD-10-CM | POA: Diagnosis not present

## 2023-05-22 DIAGNOSIS — M25532 Pain in left wrist: Secondary | ICD-10-CM | POA: Diagnosis not present

## 2023-05-22 DIAGNOSIS — R41841 Cognitive communication deficit: Secondary | ICD-10-CM | POA: Diagnosis not present

## 2023-05-22 DIAGNOSIS — Z882 Allergy status to sulfonamides status: Secondary | ICD-10-CM

## 2023-05-22 DIAGNOSIS — R410 Disorientation, unspecified: Secondary | ICD-10-CM | POA: Diagnosis not present

## 2023-05-22 DIAGNOSIS — D519 Vitamin B12 deficiency anemia, unspecified: Secondary | ICD-10-CM | POA: Diagnosis present

## 2023-05-22 DIAGNOSIS — Z7401 Bed confinement status: Secondary | ICD-10-CM | POA: Diagnosis not present

## 2023-05-22 DIAGNOSIS — I959 Hypotension, unspecified: Secondary | ICD-10-CM | POA: Diagnosis not present

## 2023-05-22 DIAGNOSIS — M19041 Primary osteoarthritis, right hand: Secondary | ICD-10-CM | POA: Diagnosis not present

## 2023-05-22 DIAGNOSIS — R1311 Dysphagia, oral phase: Secondary | ICD-10-CM | POA: Diagnosis not present

## 2023-05-22 DIAGNOSIS — R000 Tachycardia, unspecified: Secondary | ICD-10-CM | POA: Diagnosis not present

## 2023-05-22 DIAGNOSIS — Z880 Allergy status to penicillin: Secondary | ICD-10-CM

## 2023-05-22 DIAGNOSIS — Z888 Allergy status to other drugs, medicaments and biological substances status: Secondary | ICD-10-CM

## 2023-05-22 DIAGNOSIS — Z7982 Long term (current) use of aspirin: Secondary | ICD-10-CM | POA: Diagnosis not present

## 2023-05-22 DIAGNOSIS — W19XXXA Unspecified fall, initial encounter: Secondary | ICD-10-CM | POA: Diagnosis present

## 2023-05-22 DIAGNOSIS — R2689 Other abnormalities of gait and mobility: Secondary | ICD-10-CM | POA: Diagnosis not present

## 2023-05-22 DIAGNOSIS — R279 Unspecified lack of coordination: Secondary | ICD-10-CM | POA: Diagnosis not present

## 2023-05-22 DIAGNOSIS — Z751 Person awaiting admission to adequate facility elsewhere: Secondary | ICD-10-CM

## 2023-05-22 DIAGNOSIS — B9620 Unspecified Escherichia coli [E. coli] as the cause of diseases classified elsewhere: Secondary | ICD-10-CM | POA: Diagnosis present

## 2023-05-22 DIAGNOSIS — R6520 Severe sepsis without septic shock: Secondary | ICD-10-CM | POA: Diagnosis not present

## 2023-05-22 DIAGNOSIS — G9341 Metabolic encephalopathy: Secondary | ICD-10-CM | POA: Diagnosis present

## 2023-05-22 DIAGNOSIS — E876 Hypokalemia: Secondary | ICD-10-CM | POA: Diagnosis present

## 2023-05-22 DIAGNOSIS — M6281 Muscle weakness (generalized): Secondary | ICD-10-CM | POA: Diagnosis not present

## 2023-05-22 DIAGNOSIS — M159 Polyosteoarthritis, unspecified: Secondary | ICD-10-CM | POA: Diagnosis present

## 2023-05-22 DIAGNOSIS — E7800 Pure hypercholesterolemia, unspecified: Secondary | ICD-10-CM | POA: Diagnosis not present

## 2023-05-22 DIAGNOSIS — M170 Bilateral primary osteoarthritis of knee: Secondary | ICD-10-CM | POA: Diagnosis not present

## 2023-05-22 DIAGNOSIS — G9340 Encephalopathy, unspecified: Secondary | ICD-10-CM | POA: Diagnosis present

## 2023-05-22 DIAGNOSIS — E43 Unspecified severe protein-calorie malnutrition: Secondary | ICD-10-CM | POA: Diagnosis not present

## 2023-05-22 DIAGNOSIS — R2681 Unsteadiness on feet: Secondary | ICD-10-CM | POA: Diagnosis not present

## 2023-05-22 DIAGNOSIS — D649 Anemia, unspecified: Secondary | ICD-10-CM | POA: Insufficient documentation

## 2023-05-22 DIAGNOSIS — I10 Essential (primary) hypertension: Secondary | ICD-10-CM | POA: Diagnosis not present

## 2023-05-22 DIAGNOSIS — F419 Anxiety disorder, unspecified: Secondary | ICD-10-CM | POA: Diagnosis present

## 2023-05-22 DIAGNOSIS — R109 Unspecified abdominal pain: Secondary | ICD-10-CM | POA: Diagnosis not present

## 2023-05-22 DIAGNOSIS — E538 Deficiency of other specified B group vitamins: Secondary | ICD-10-CM | POA: Insufficient documentation

## 2023-05-22 DIAGNOSIS — N12 Tubulo-interstitial nephritis, not specified as acute or chronic: Secondary | ICD-10-CM | POA: Diagnosis present

## 2023-05-22 DIAGNOSIS — R636 Underweight: Secondary | ICD-10-CM | POA: Diagnosis present

## 2023-05-22 DIAGNOSIS — I5042 Chronic combined systolic (congestive) and diastolic (congestive) heart failure: Secondary | ICD-10-CM | POA: Diagnosis present

## 2023-05-22 DIAGNOSIS — Z1152 Encounter for screening for COVID-19: Secondary | ICD-10-CM | POA: Diagnosis not present

## 2023-05-22 DIAGNOSIS — Z90710 Acquired absence of both cervix and uterus: Secondary | ICD-10-CM

## 2023-05-22 DIAGNOSIS — Z8249 Family history of ischemic heart disease and other diseases of the circulatory system: Secondary | ICD-10-CM

## 2023-05-22 DIAGNOSIS — I110 Hypertensive heart disease with heart failure: Secondary | ICD-10-CM | POA: Diagnosis present

## 2023-05-22 DIAGNOSIS — R296 Repeated falls: Secondary | ICD-10-CM | POA: Diagnosis present

## 2023-05-22 DIAGNOSIS — R7881 Bacteremia: Secondary | ICD-10-CM | POA: Diagnosis present

## 2023-05-22 DIAGNOSIS — Z87442 Personal history of urinary calculi: Secondary | ICD-10-CM

## 2023-05-22 DIAGNOSIS — F0284 Dementia in other diseases classified elsewhere, unspecified severity, with anxiety: Secondary | ICD-10-CM | POA: Diagnosis present

## 2023-05-22 DIAGNOSIS — A4151 Sepsis due to Escherichia coli [E. coli]: Secondary | ICD-10-CM | POA: Diagnosis present

## 2023-05-22 DIAGNOSIS — N16 Renal tubulo-interstitial disorders in diseases classified elsewhere: Secondary | ICD-10-CM | POA: Diagnosis not present

## 2023-05-22 DIAGNOSIS — R531 Weakness: Secondary | ICD-10-CM | POA: Diagnosis not present

## 2023-05-22 DIAGNOSIS — M19042 Primary osteoarthritis, left hand: Secondary | ICD-10-CM | POA: Diagnosis not present

## 2023-05-22 DIAGNOSIS — R4189 Other symptoms and signs involving cognitive functions and awareness: Secondary | ICD-10-CM | POA: Diagnosis present

## 2023-05-22 LAB — CBC WITH DIFFERENTIAL/PLATELET
Abs Immature Granulocytes: 0.04 10*3/uL (ref 0.00–0.07)
Basophils Absolute: 0 10*3/uL (ref 0.0–0.1)
Basophils Relative: 0 %
Eosinophils Absolute: 0 10*3/uL (ref 0.0–0.5)
Eosinophils Relative: 0 %
HCT: 40.6 % (ref 36.0–46.0)
Hemoglobin: 13.1 g/dL (ref 12.0–15.0)
Immature Granulocytes: 0 %
Lymphocytes Relative: 5 %
Lymphs Abs: 0.5 10*3/uL — ABNORMAL LOW (ref 0.7–4.0)
MCH: 30.2 pg (ref 26.0–34.0)
MCHC: 32.3 g/dL (ref 30.0–36.0)
MCV: 93.5 fL (ref 80.0–100.0)
Monocytes Absolute: 0.5 10*3/uL (ref 0.1–1.0)
Monocytes Relative: 6 %
Neutro Abs: 8.8 10*3/uL — ABNORMAL HIGH (ref 1.7–7.7)
Neutrophils Relative %: 89 %
Platelets: 207 10*3/uL (ref 150–400)
RBC: 4.34 MIL/uL (ref 3.87–5.11)
RDW: 12.5 % (ref 11.5–15.5)
WBC: 9.8 10*3/uL (ref 4.0–10.5)
nRBC: 0 % (ref 0.0–0.2)

## 2023-05-22 LAB — COMPREHENSIVE METABOLIC PANEL
ALT: <5 U/L (ref 0–44)
AST: 23 U/L (ref 15–41)
Albumin: 3.8 g/dL (ref 3.5–5.0)
Alkaline Phosphatase: 106 U/L (ref 38–126)
Anion gap: 14 (ref 5–15)
BUN: 37 mg/dL — ABNORMAL HIGH (ref 8–23)
CO2: 21 mmol/L — ABNORMAL LOW (ref 22–32)
Calcium: 9.5 mg/dL (ref 8.9–10.3)
Chloride: 102 mmol/L (ref 98–111)
Creatinine, Ser: 1.01 mg/dL — ABNORMAL HIGH (ref 0.44–1.00)
GFR, Estimated: 59 mL/min — ABNORMAL LOW (ref >60)
Glucose, Bld: 109 mg/dL — ABNORMAL HIGH (ref 70–99)
Potassium: 3.9 mmol/L (ref 3.5–5.1)
Sodium: 137 mmol/L (ref 135–145)
Total Bilirubin: 0.9 mg/dL (ref 0.3–1.2)
Total Protein: 7.4 g/dL (ref 6.5–8.1)

## 2023-05-22 LAB — RESP PANEL BY RT-PCR (RSV, FLU A&B, COVID)  RVPGX2
Influenza A by PCR: NEGATIVE
Influenza B by PCR: NEGATIVE
Resp Syncytial Virus by PCR: NEGATIVE
SARS Coronavirus 2 by RT PCR: NEGATIVE

## 2023-05-22 LAB — APTT: aPTT: 29 seconds (ref 24–36)

## 2023-05-22 LAB — PROTIME-INR
INR: 1 (ref 0.8–1.2)
Prothrombin Time: 13.2 seconds (ref 11.4–15.2)

## 2023-05-22 LAB — I-STAT CG4 LACTIC ACID, ED: Lactic Acid, Venous: 1.1 mmol/L (ref 0.5–1.9)

## 2023-05-22 MED ORDER — FOLIC ACID 1 MG PO TABS: 1.0000 mg | ORAL_TABLET | Freq: Every day | ORAL | Status: DC

## 2023-05-22 MED ORDER — ASPIRIN 81 MG PO TBEC
81.0000 mg | DELAYED_RELEASE_TABLET | Freq: Every day | ORAL | Status: DC
  Administered 2023-05-23 – 2023-05-31 (×9): 81 mg via ORAL
  Filled 2023-05-22 (×9): qty 1

## 2023-05-22 MED ORDER — IOHEXOL 350 MG/ML SOLN
75.0000 mL | Freq: Once | INTRAVENOUS | Status: AC | PRN
  Administered 2023-05-22: 75 mL via INTRAVENOUS

## 2023-05-22 MED ORDER — ENSURE ENLIVE PO LIQD
237.0000 mL | Freq: Two times a day (BID) | ORAL | Status: DC
  Administered 2023-05-23 – 2023-05-30 (×13): 237 mL via ORAL
  Filled 2023-05-22: qty 237

## 2023-05-22 MED ORDER — ALPRAZOLAM 0.25 MG PO TABS: 0.2500 mg | ORAL_TABLET | Freq: Two times a day (BID) | ORAL | Status: DC | PRN

## 2023-05-22 MED ORDER — METRONIDAZOLE 500 MG/100ML IV SOLN
500.0000 mg | Freq: Once | INTRAVENOUS | Status: AC
  Administered 2023-05-22: 500 mg via INTRAVENOUS
  Filled 2023-05-22: qty 100

## 2023-05-22 MED ORDER — SODIUM CHLORIDE 0.9 % IV SOLN
2.0000 g | INTRAVENOUS | Status: DC
  Administered 2023-05-23 – 2023-05-25 (×3): 2 g via INTRAVENOUS
  Filled 2023-05-22 (×3): qty 20

## 2023-05-22 MED ORDER — ACETAMINOPHEN 650 MG RE SUPP: 650.0000 mg | Freq: Four times a day (QID) | RECTAL | Status: DC | PRN

## 2023-05-22 MED ORDER — LACTATED RINGERS IV SOLN: INTRAVENOUS | Status: DC

## 2023-05-22 MED ORDER — CARBIDOPA-LEVODOPA 25-100 MG PO TABS
2.0000 | ORAL_TABLET | ORAL | Status: DC
  Administered 2023-05-23 – 2023-05-31 (×18): 2 via ORAL
  Filled 2023-05-22 (×18): qty 2

## 2023-05-22 MED ORDER — ACETAMINOPHEN 500 MG PO TABS
1000.0000 mg | ORAL_TABLET | Freq: Once | ORAL | Status: AC
  Administered 2023-05-22: 1000 mg via ORAL
  Filled 2023-05-22: qty 2

## 2023-05-22 MED ORDER — ACETAMINOPHEN 325 MG PO TABS
650.0000 mg | ORAL_TABLET | Freq: Four times a day (QID) | ORAL | Status: DC | PRN
  Administered 2023-05-23: 650 mg via ORAL
  Filled 2023-05-22 (×2): qty 2

## 2023-05-22 MED ORDER — LACTATED RINGERS IV BOLUS (SEPSIS)
1000.0000 mL | Freq: Once | INTRAVENOUS | Status: AC
  Administered 2023-05-22: 1000 mL via INTRAVENOUS

## 2023-05-22 MED ORDER — SODIUM CHLORIDE 0.9 % IV SOLN: INTRAVENOUS | Status: DC

## 2023-05-22 MED ORDER — VANCOMYCIN HCL IN DEXTROSE 1-5 GM/200ML-% IV SOLN
1000.0000 mg | Freq: Once | INTRAVENOUS | Status: AC
  Administered 2023-05-22: 1000 mg via INTRAVENOUS
  Filled 2023-05-22: qty 200

## 2023-05-22 MED ORDER — CARBIDOPA-LEVODOPA 25-100 MG PO TABS
1.5000 | ORAL_TABLET | ORAL | Status: DC
  Administered 2023-05-23 – 2023-05-30 (×8): 1.5 via ORAL
  Filled 2023-05-22 (×8): qty 2

## 2023-05-22 MED ORDER — ONDANSETRON HCL 4 MG/2ML IJ SOLN: 4.0000 mg | Freq: Four times a day (QID) | INTRAMUSCULAR | Status: DC | PRN

## 2023-05-22 MED ORDER — THIAMINE MONONITRATE 100 MG PO TABS: 250.0000 mg | ORAL_TABLET | Freq: Every day | ORAL | Status: DC

## 2023-05-22 MED ORDER — HYDROCODONE-ACETAMINOPHEN 5-325 MG PO TABS: 1.0000 | ORAL_TABLET | ORAL | Status: DC | PRN

## 2023-05-22 MED ORDER — CARBIDOPA-LEVODOPA 25-100 MG PO TABS: 1.5000 | ORAL_TABLET | ORAL | Status: DC

## 2023-05-22 MED ORDER — LACTATED RINGERS IV SOLN
150.0000 mL/h | INTRAVENOUS | Status: DC
  Administered 2023-05-23: 150 mL/h via INTRAVENOUS

## 2023-05-22 MED ORDER — SODIUM CHLORIDE 0.9 % IV SOLN
2.0000 g | Freq: Once | INTRAVENOUS | Status: AC
  Administered 2023-05-22: 2 g via INTRAVENOUS
  Filled 2023-05-22: qty 12.5

## 2023-05-22 MED ORDER — SODIUM CHLORIDE 0.9 % IV SOLN
2.0000 g | Freq: Once | INTRAVENOUS | Status: DC
  Filled 2023-05-22: qty 10

## 2023-05-22 MED ORDER — ROPINIROLE HCL 0.25 MG PO TABS
0.1250 mg | ORAL_TABLET | Freq: Every day | ORAL | Status: DC
  Administered 2023-05-23 – 2023-05-28 (×7): 0.125 mg via ORAL
  Filled 2023-05-22 (×10): qty 0.5

## 2023-05-22 MED ORDER — ONDANSETRON HCL 4 MG PO TABS
4.0000 mg | ORAL_TABLET | Freq: Four times a day (QID) | ORAL | Status: DC | PRN
  Administered 2023-05-28: 4 mg via ORAL
  Filled 2023-05-22: qty 1

## 2023-05-22 NOTE — Subjective & Objective (Signed)
from [facility] pt is deaf and hx of PArkinson Fatigue and falls Increased confusion , urinary complaints,  Noted to be febrile

## 2023-05-22 NOTE — ED Triage Notes (Signed)
Per EMS, Pt, from [facility], c/o weakness w/ multiple falls over the past 24hrs, confusion, and urinary incontinence.  EMS reports Pt is normally A&Ox4.  Hx of Parkinson's.

## 2023-05-22 NOTE — H&P (Signed)
Hannah Fisher:[id number] DOB: [birth year] DOA: 05/22/2023     PCP: Daisy Floro, MD   Outpatient Specialists: * NONE CARDS: Dr. Armanda Magic, MD    NEurology [phone] neurology      Patient arrived to ER on 05/22/23 at 1718 Referred by Attending Ernie Avena, MD   Patient coming from:     [facility]    Chief Complaint:   Chief Complaint  Patient presents with   Weakness   Altered Mental Status    HPI: Hannah Fisher is a 72 y.o. female with medical history significant of parkinson, deafness, HTN  cognitive dysfunction,   Presented with   fever, fatigue, falls from [facility] pt is deaf and hx of PArkinson Fatigue and falls Increased confusion , urinary complaints,  Noted to be febrile    Had similar admit in [place] 2024    Denies significant ETOH intake   Does not smoke   Lab Results  Component Value Date   SARSCOV2NAA NEGATIVE 05/22/2023   SARSCOV2NAA NEGATIVE 01/03/2021   SARSCOV2NAA NEGATIVE 12/28/2020   SARSCOV2NAA NEGATIVE 02/02/2020        Regarding pertinent Chronic problems:   Parkinson on Carbidopa levodopa    HTN on lisinopril   chronic CHF diastolic/systolic/ combined - last echo  Recent Results (from the past 62130 hour(s))  ECHOCARDIOGRAM COMPLETE   Collection Time: 01/27/20  9:53 AM   Narrative      ECHOCARDIOGRAM REPORT       1. Left ventricular ejection fraction, by estimation, is 60 to 65%. The left ventricle has normal function. The left ventricle has no regional wall motion abnormalities. There is mild left ventricular hypertrophy. Left ventricular diastolic parameters  are consistent with Grade I diastolic dysfunction (impaired relaxation).  2. Right ventricular systolic function is normal. The right ventricular size is normal.  3. The mitral valve is grossly normal. Trivial mitral valve regurgitation.  4. The aortic valve is tricuspid. Aortic valve regurgitation is not  visualized.  5. The inferior vena cava is normal in size with greater than 50% respiratory variability, suggesting right atrial pressure of 3 mmHg.         While in ER:  AKI Initialy given braod spectrum ABX        Lab Orders         Resp panel by RT-PCR (RSV, Flu A&B, Covid) Anterior Nasal Swab         Blood Culture (routine x 2)         Comprehensive metabolic panel         CBC with Differential         Protime-INR         APTT         Urinalysis, w/ Reflex to Culture (Infection Suspected) -Urine, Clean Catch      CT HEAD   NON acute     CXR -  NON acute  CTabd/pelvis - Right pyelonephritis.     Following Medications were ordered in ER: Medications  lactated ringers infusion (has no administration in time range)  lactated ringers bolus 1,000 mL (0 mLs Intravenous Stopped 05/22/23 1923)  metroNIDAZOLE (FLAGYL) IVPB 500 mg (0 mg Intravenous Stopped 05/22/23 1923)  vancomycin (VANCOCIN) IVPB 1000 mg/200 mL premix (0 mg Intravenous Stopped 05/22/23 2136)  ceFEPIme (MAXIPIME) 2 g in sodium chloride 0.9 % 100 mL IVPB (0 g Intravenous Stopped 05/22/23 2045)  acetaminophen (TYLENOL) tablet 1,000 mg (1,000 mg Oral  Given 05/22/23 2030)  iohexol (OMNIPAQUE) 350 MG/ML injection 75 mL (75 mLs Intravenous Contrast Given 05/22/23 1947)       ED Triage Vitals [05/22/23 1715]  Encounter Vitals Group     BP 128/84     Systolic BP Percentile      Diastolic BP Percentile      Pulse Rate (!) 105     Resp (!) 22     Temp (!) 100.8 F (38.2 C)     Temp Source Oral     SpO2 99 %     Weight      Height      Head Circumference      Peak Flow      Pain Score      Pain Loc      Pain Education      Exclude from Growth Chart   TMAX(24)@     _________________________________________ Significant initial  Findings: Abnormal Labs Reviewed  COMPREHENSIVE METABOLIC PANEL - Abnormal; Notable for the following components:      Result Value   CO2 21 (*)    Glucose, Bld 109 (*)    BUN 37 (*)     Creatinine, Ser 1.01 (*)    GFR, Estimated 59 (*)    All other components within normal limits  CBC WITH DIFFERENTIAL/PLATELET - Abnormal; Notable for the following components:   Neutro Abs 8.8 (*)    Lymphs Abs 0.5 (*)    All other components within normal limits       ECG: Ordered Personally reviewed and interpreted by me showing: HR : 105 Rhythm: Sinus tachycardia Probable left atrial enlargement RSR' in V1 or V2, probably normal variant QTC 426   COVID-19 Labs  No results for input(s): "DDIMER", "FERRITIN", "LDH", "CRP" in the last 72 hours.  Lab Results  Component Value Date   SARSCOV2NAA NEGATIVE 05/22/2023   SARSCOV2NAA NEGATIVE 01/03/2021   SARSCOV2NAA NEGATIVE 12/28/2020   SARSCOV2NAA NEGATIVE 02/02/2020    ____________________ This patient meets SIRS Criteria and may be septic.    The recent clinical data is shown below. Vitals:   05/22/23 1900 05/22/23 1915 05/22/23 2053 05/22/23 2100  BP: 131/80 (!) 160/92 135/82 121/77  Pulse: (!) 104 (!) 119 (!) 102 98  Resp: 17 20 16 20   Temp:      TempSrc:      SpO2: 99% 98% 98% 98%    WBC     Component Value Date/Time   WBC 9.8 05/22/2023 1801   LYMPHSABS 0.5 (L) 05/22/2023 1801   LYMPHSABS 1.6 08/23/2021 1459   MONOABS 0.5 05/22/2023 1801   EOSABS 0.0 05/22/2023 1801   EOSABS 0.2 08/23/2021 1459   BASOSABS 0.0 05/22/2023 1801   BASOSABS 0.0 08/23/2021 1459     Lactic Acid, Venous    Component Value Date/Time   LATICACIDVEN 1.1 05/22/2023 1812       Procalcitonin   Ordered      UA  ordered    Results for orders placed or performed during the hospital encounter of 05/22/23  Resp panel by RT-PCR (RSV, Flu A&B, Covid) Anterior Nasal Swab     Status: None   Collection Time: 05/22/23  6:54 PM   Specimen: Anterior Nasal Swab  Result Value Ref Range Status   SARS Coronavirus 2 by RT PCR NEGATIVE NEGATIVE Final   Influenza A by PCR NEGATIVE NEGATIVE Final   Influenza B by PCR NEGATIVE NEGATIVE  Final         Resp  Syncytial Virus by PCR NEGATIVE NEGATIVE Final          ABX started Antibiotics Given (last 72 hours)     Date/Time Action Medication Dose Rate   05/22/23 1808 New Bag/Given   metroNIDAZOLE (FLAGYL) IVPB 500 mg 500 mg 100 mL/hr   05/22/23 1904 New Bag/Given   ceFEPIme (MAXIPIME) 2 g in sodium chloride 0.9 % 100 mL IVPB 2 g 200 mL/hr   05/22/23 2033 New Bag/Given   vancomycin (VANCOCIN) IVPB 1000 mg/200 mL premix 1,000 mg 200 mL/hr       No results found for the last 90 days.    __________________________________________________________ Recent Labs  Lab 05/22/23 1801  NA 137  K 3.9  CO2 21*  GLUCOSE 109*  BUN 37*  CREATININE 1.01*  CALCIUM 9.5    Cr  Up from baseline see below Lab Results  Component Value Date   CREATININE 1.01 (H) 05/22/2023   CREATININE 0.66 02/21/2023   CREATININE 0.58 02/20/2023    Recent Labs  Lab 05/22/23 1801  AST 23  ALT <5  ALKPHOS 106  BILITOT 0.9  PROT 7.4  ALBUMIN 3.8   Lab Results  Component Value Date   CALCIUM 9.5 05/22/2023   PHOS 3.2 02/21/2023       Plt: Lab Results  Component Value Date   PLT 207 05/22/2023       Recent Labs  Lab 05/22/23 1801  WBC 9.8  NEUTROABS 8.8*  HGB 13.1  HCT 40.6  MCV 93.5  PLT 207    HG/HCT  stable     Component Value Date/Time   HGB 13.1 05/22/2023 1801   HGB 13.7 08/23/2021 1459   HCT 40.6 05/22/2023 1801   HCT 40.9 08/23/2021 1459   MCV 93.5 05/22/2023 1801   MCV 91 08/23/2021 1459     _______________________________________________ Hospitalist was called for admission for   Pyelonephritis    Sepsis,      The following Work up has been ordered so far:  Orders Placed This Encounter  Procedures   Critical Care   Resp panel by RT-PCR (RSV, Flu A&B, Covid) Anterior Nasal Swab   Blood Culture (routine x 2)   DG Chest Port 1 View   CT HEAD WO CONTRAST ( )   CT ABDOMEN PELVIS W CONTRAST   Comprehensive metabolic panel   CBC with  Differential   Protime-INR   APTT   Urinalysis, w/ Reflex to Culture (Infection Suspected) -Urine, Clean Catch   Diet NPO time specified   Document height and weight   Assess and Document Glasgow Coma Scale   Document vital signs within 1-hour of fluid bolus completion. Notify provider of abnormal vital signs despite fluid resuscitation.   DO NOT delay antibiotics if unable to obtain blood culture.   Refer to Sidebar Report: Sepsis Sidebar ED/IP   Notify provider for difficulties obtaining IV access.   Insert peripheral IV x 2   Initiate Carrier Fluid Protocol   Code Sepsis activation.  This occurs automatically when order is signed and prioritizes pharmacy, lab, and radiology services for STAT collections and interventions.  If [facility] downtime, call [facility] [phone]) to activate Code Sepsis.   Consult for [facility] Admission   EKG 12-Lead   ED EKG 12-Lead     OTHER Significant initial  Findings:  labs showing:     DM  labs:  HbA1C: No results for input(s): "HGBA1C" in the last 8760 hours.     CBG (last 3)  No results  for input(s): "GLUCAP" in the last 72 hours.        Cultures:    Component Value Date/Time   SDES URINE, CATHETERIZED 12/30/2020 0545   SPECREQUEST NONE 12/30/2020 0545   CULT  12/30/2020 0545    NO GROWTH Performed at [facility], [address] [place], [place] [address]    REPTSTATUS 12/31/2020 FINAL 12/30/2020 0545     Radiological Exams on Admission: CT ABDOMEN PELVIS W CONTRAST  Result Date: 05/22/2023 CLINICAL DATA:  Sepsis, abdominal pain, acute, non localized EXAM: CT ABDOMEN AND PELVIS WITH CONTRAST TECHNIQUE: Multidetector CT imaging of the abdomen and pelvis was performed using the standard protocol following bolus administration of intravenous contrast. RADIATION DOSE REDUCTION: This exam was performed according to the departmental dose-optimization program which includes automated exposure control, adjustment of the mA  and/or kV according to patient size and/or use of iterative reconstruction technique. CONTRAST:  75mL OMNIPAQUE IOHEXOL 350 MG/ML SOLN COMPARISON:  CT abdomen and pelvis 09/29/2021 FINDINGS: Lower chest: No acute abnormality. Hepatobiliary: Unremarkable liver. Normal gallbladder. No biliary dilation. Pancreas: Unremarkable. Spleen: Unremarkable. Adrenals/Urinary Tract: Normal adrenal glands. Geographic cortical hypoattenuation within the right kidney compatible with pyelonephritis. No urinary calculi or hydronephrosis. Bladder is unremarkable. Stomach/Bowel: Normal caliber large and small bowel. No bowel wall thickening. The appendix is normal.Stomach is within normal limits. Vascular/Lymphatic: No significant vascular findings are present. No enlarged abdominal or pelvic lymph nodes. Reproductive: Unremarkable. Other: No free intraperitoneal fluid or air. Musculoskeletal: No acute fracture. Chronic compression fracture of L4 is unchanged. IMPRESSION: Right pyelonephritis. Electronically Signed   By: Minerva Fester M.D.   On: 05/22/2023 20:06   CT HEAD WO CONTRAST ( )  Result Date: 05/22/2023 CLINICAL DATA:  Weakness with multiple falls, confusion, urinary incontinence EXAM: CT HEAD WITHOUT CONTRAST TECHNIQUE: Contiguous axial images were obtained from the base of the skull through the vertex without intravenous contrast. RADIATION DOSE REDUCTION: This exam was performed according to the departmental dose-optimization program which includes automated exposure control, adjustment of the mA and/or kV according to patient size and/or use of iterative reconstruction technique. COMPARISON:  02/19/2023 FINDINGS: Brain: No evidence of acute infarction, hemorrhage, mass, mass effect, or midline shift. No hydrocephalus or extra-axial fluid collection. Normal cerebral volume for age. Vascular: No hyperdense vessel. Skull: Negative for fracture or focal lesion. Sinuses/Orbits: No acute finding. Other: The mastoid air  cells are well aerated. IMPRESSION: No acute intracranial process. Electronically Signed   By: Wiliam Ke M.D.   On: 05/22/2023 19:27   DG Chest Port 1 View  Result Date: 05/22/2023 CLINICAL DATA:  Weakness and multiple falls. EXAM: PORTABLE CHEST 1 VIEW COMPARISON:  02/19/2023 FINDINGS: Stable cardiomediastinal silhouette. Aortic atherosclerotic calcification. No focal consolidation, pleural effusion, or pneumothorax. No displaced rib fractures. IMPRESSION: No acute cardiopulmonary disease. Electronically Signed   By: Minerva Fester M.D.   On: 05/22/2023 19:01   _______________________________________________________________________________________________________ Latest  Blood pressure 121/77, pulse 98, temperature (!) 100.8 F (38.2 C), temperature source Oral, resp. rate 20, last menstrual period 10/02/1990, SpO2 98%.   Vitals  labs and radiology finding personally reviewed  Review of Systems:    Pertinent positives include:   Fevers, chills, fatigue,  confusion Constitutional:  No weight loss, night sweats,weight loss  HEENT:  No headaches, Difficulty swallowing,Tooth/dental problems,Sore throat,  No sneezing, itching, ear ache, nasal congestion, post nasal drip,  Cardio-vascular:  No chest pain, Orthopnea, PND, anasarca, dizziness, palpitations.no Bilateral lower extremity swelling  GI:  No heartburn, indigestion, abdominal pain,  nausea, vomiting, diarrhea, change in bowel habits, loss of appetite, melena, blood in stool, hematemesis Resp:  no shortness of breath at rest. No dyspnea on exertion, No excess mucus, no productive cough, No non-productive cough, No coughing up of blood.No change in color of mucus.No wheezing. Skin:  no rash or lesions. No jaundice GU:  no dysuria, change in color of urine, no urgency or frequency. No straining to urinate.  No flank pain.  Musculoskeletal:  No joint pain or no joint swelling. No decreased range of motion. No back pain.  Psych:   No change in mood or affect. No depression or anxiety. No memory loss.  Neuro: no localizing neurological complaints, no tingling, no weakness, no double vision, no gait abnormality, no slurred speech, no   All systems reviewed and apart from HOPI all are negative _______________________________________________________________________________________________ Past Medical History:   Past Medical History:  Diagnosis Date   Arthritis    hands, knees   Deafness    left ear sue to shingles   Hypertension    Kidney stones    Osteoporosis 2009   -2.6   Shingles       Past Surgical History:  Procedure Laterality Date   MASS EXCISION Right 05/30/2018   Procedure: EXCISION MASS RIGHT SMALL FINGER;  Surgeon: Cindee Salt, MD;  Location: [facility];  Service: Orthopedics;  Laterality: Right;   TONSILLECTOMY     VAGINAL HYSTERECTOMY  1992    Social History:  Ambulatory   cane, walker       reports that she has never smoked. She has never used smokeless tobacco. She reports current alcohol use. She reports that she does not use drugs.     Family History:   Family History  Problem Relation Age of Onset   Hypertension Mother    Stroke Mother    Hypertension Sister    Hypertension Brother    Breast cancer Neg Hx    ______________________________________________________________________________________________ Allergies: Allergies  Allergen Reactions   Prednisone Shortness Of Breath, Swelling and Other (See Comments)    Tightness in chest, trouble breathing, edema, and chest pressure   Penicillins Hives, Swelling and Other (See Comments)    Edema and itching, no hospitalization or airway required    Beta Adrenergic Blockers Nausea Only and Other (See Comments)    Made the patient feel badly   Sulfa Antibiotics Rash     Prior to Admission medications   Medication Sig Start Date End Date Taking? Authorizing Provider  acetaminophen (TYLENOL) 650 MG CR tablet  Take 650 mg by mouth in the morning and at bedtime.    [provider]  ALPRAZolam Prudy Feeler) 0.25 MG tablet Take 1 tablet (0.25 mg total) by mouth 2 (two) times daily as needed for anxiety. 02/23/23   Standley Brooking, MD  aspirin 81 MG EC tablet Take 81 mg by mouth daily.    [provider]  B Complex Vitamins (B COMPLEX PO) Take 1 tablet by mouth daily with breakfast.    [provider]  carbidopa-levodopa (SINEMET IR) 25-100 MG tablet Take 1.5-2 tablets by mouth See admin instructions. Take 2 tablets by mouth at 9 AM & 12 NOON, then 1.5 tablets at 7 PM 09/18/22   [provider]  feeding supplement (ENSURE ENLIVE / ENSURE PLUS) LIQD Take 237 mLs by mouth 2 (two) times daily between meals. 01/04/21   Erick Blinks, MD  folic acid (FOLVITE) 1 MG tablet Take 1 tablet (1 mg total) by mouth  daily. 02/24/23   Standley Brooking, MD  lisinopril (ZESTRIL) 2.5 MG tablet Take 2.5 mg by mouth daily.    [provider]  Multiple Vitamins-Minerals (MULTIVITAMIN WOMEN) TABS Take 1 tablet by mouth daily with breakfast.    [provider]  naproxen sodium (ALEVE) 220 MG tablet Take 220-440 mg by mouth 2 (two) times daily as needed (for pain).    [provider]  polyethylene glycol (MIRALAX / GLYCOLAX) 17 g packet Take 17 g by mouth daily. 01/05/21   Erick Blinks, MD  rOPINIRole (REQUIP) 0.25 MG tablet Take 0.125 mg by mouth at bedtime.    [provider]  thiamine 250 MG TABS Take 250 mg by mouth daily. 02/24/23   Standley Brooking, MD    ___________________________________________________________________________________________________ Physical Exam:    05/22/2023    9:00 PM 05/22/2023    8:53 PM 05/22/2023    7:15 PM  Vitals with BMI  Systolic 121 135 161  Diastolic 77 82 92  Pulse 98 102 119     1. General:  in No  Acute distress   Chronically ill  -appearing 2. Psychological: Alert and   Oriented 3. Head/ENT:    Dry Mucous  Membranes                          Head Non traumatic, neck supple                         Poor Dentition 4. SKIN:  decreased Skin turgor,  Skin clean Dry and intact no rash    5. Heart: Regular rate and rhythm no  Murmur, no Rub or gallop 6. Lungs:  no wheezes or crackles   7. Abdomen: Soft,  non-tender, Non distended  bowel sounds present 8. Lower extremities: no clubbing, cyanosis, no  edema 9. Neurologically Grossly intact, moving all 4 extremities equally   10. MSK: Normal range of motion    Chart has been reviewed  ______________________________________________________________________________________________  Assessment/Plan 72 y.o. female with medical history significant of parkinson, deafness, HTN  cognitive dysfunction,    Admitted for   Pyelonephritis  Sepsis,    Present on Admission:  Sepsis (HCC)  Hypertension  Acute encephalopathy  Parkinson disease     Sepsis (HCC)  -SIRS criteria met with      Component Value Date/Time   WBC 9.8 05/22/2023 1801   LYMPHSABS 0.5 (L) 05/22/2023 1801   LYMPHSABS 1.6 08/23/2021 1459     tachycardia   fever   RR >20 Today's Vitals   05/22/23 2230 05/22/23 2245 05/22/23 2300 05/22/23 2347  BP: 105/74 112/68 115/78 120/79  Pulse: 96 (!) 102 95 97  Resp: 16 (!) 23 16   Temp:    98.7 F (37.1 C)  TempSrc:    Oral  SpO2: 98% 96% 97% 99%    The recent clinical data is shown below. Vitals:   05/22/23 2230 05/22/23 2245 05/22/23 2300 05/22/23 2347  BP: 105/74 112/68 115/78 120/79  Pulse: 96 (!) 102 95 97  Resp: 16 (!) 23 16   Temp:    98.7 F (37.1 C)  TempSrc:    Oral  SpO2: 98% 96% 97% 99%      -Most likely source being:  urinary,    Patient meeting criteria for Severe sepsis with  acute encephalopahty     - Obtain serial lactic acid and procalcitonin level.  - Initiated IV antibiotics  in ER: Antibiotics Given (last 72 hours)     Date/Time Action Medication Dose Rate   05/22/23 1808 New Bag/Given    metroNIDAZOLE (FLAGYL) IVPB 500 mg 500 mg 100 mL/hr   05/22/23 1904 New Bag/Given   ceFEPIme (MAXIPIME) 2 g in sodium chloride 0.9 % 100 mL IVPB 2 g 200 mL/hr   05/22/23 2033 New Bag/Given   vancomycin (VANCOCIN) IVPB 1000 mg/200 mL premix 1,000 mg 200 mL/hr       Will continue  on : rocephin   - await results of blood and urine culture  - Rehydrate aggressively  Intravenous fluids were administered,      12:13 AM   Hypertension Allow permissive hypertension for tonight  Acute encephalopathy In the setting of sepsis and urinary tract infection will rehydrate Treat underlying infection.  If persists may need further imaging such as MRI  Parkinson disease Continue home medications carbidopa levodopa at 25/100 as prescribed    Other plan as per orders.  DVT prophylaxis:  SCD      Code Status:    Code Status: Prior FULL CODE as per patient  she would like [place] to discuss with family as well called family but no answer  Will need to touch base in AM I had personally discussed CODE STATUS with patient and family*  ACP   none     Family Communication:   Family not at  Bedside Attempted to call no answer  Diet  Diet Orders (From admission, onward)     Start     Ordered   05/22/23 2338  Diet Heart Room service appropriate? Yes; Fluid consistency: Thin  Diet effective now       Question Answer Comment  Room service appropriate? Yes   Fluid consistency: Thin      05/22/23 2338            Disposition Plan:                             Back to current facility when stable                               Following barriers for discharge:                                                          Afebrile, white count improving able to transition to PO antibiotics                       Would benefit from PT/OT eval prior to DC  Ordered                                    Transition of care consulted                   Nutrition    consulted                             Consults called: none   Admission status:  ED Disposition     ED Disposition  Admit  Condition  --   Comment  Hospital Area: [facility] [[id number]]  Level of Care: Progressive [102]  Admit to Progressive based on following criteria: MULTISYSTEM THREATS such as stable sepsis, metabolic/electrolyte imbalance with or without encephalopathy that is responding to early treatment.  May admit patient to [facility] or [facility] if equivalent level of care is available:: No  Covid Evaluation: Asymptomatic - no recent exposure (last 10 days) testing not required  Diagnosis: Sepsis [facility]) [[id number]]  Admitting Physician: Therisa Doyne [[id number]]  Attending Physician: Therisa Doyne [[id number]]  Certification:: I certify this patient will need inpatient services for at least 2 midnights  Expected Medical Readiness: 05/25/2023            inpatient     I Expect 2 midnight stay secondary to severity of patient's current illness need for inpatient interventions justified by the following:  hemodynamic instability despite optimal treatment (tachycardia  tachypnea  )   Severe lab/radiological/exam abnormalities including:    sepsis and extensive comorbidities including: parkinsons  That are currently affecting medical management.   I expect  patient to be hospitalized for 2 midnights requiring inpatient medical care.  Patient is at high risk for adverse outcome (such as loss of life or disability) if not treated.  Indication for inpatient stay as follows:  Severe change from baseline regarding mental status     Need for IV antibiotics, IV fluids,    Level of care         progressive     stepdown   tele indefinitely please discontinue once patient no longer qualifies COVID-19 Labs    Lab Results  Component Value Date   SARSCOV2NAA NEGATIVE 05/22/2023     Precautions: admitted as   Covid Negative       Shaquinta Peruski 05/23/2023, 12:19 AM    [facility]     after 2 AM please page floor coverage PA If 7AM-7PM, please contact the day team taking care of the patient using [email or web address]

## 2023-05-22 NOTE — ED Provider Notes (Addendum)
[place] EMERGENCY DEPARTMENT AT [facility] Provider Note   CSN: [id number] Arrival date & time: 05/22/23  1718     History  Chief Complaint  Patient presents with   Weakness   Altered Mental Status    Hannah Fisher is a 72 y.o. female.   Weakness Associated symptoms: frequency   Altered Mental Status Presenting symptoms: confusion   Associated symptoms: weakness      72 year old female with medical history significant for kidney stones, hypertension who presents to the emergency department with mild confusion, fever, chills, episodes of increased urinary frequency and incontinence.  The patient endorses flank tenderness, denies any midline back pain, denies any focal weakness or saddle anesthesia.  The patient has been unsteady and has fallen over the past 24's, denies any head trauma.  She arrived GCS 14, ABC intact, alert and oriented x 3 with mild confusion present when providing history of present illness.  Home Medications Prior to Admission medications   Medication Sig Start Date End Date Taking? Authorizing Provider  acetaminophen (TYLENOL) 650 MG CR tablet Take 650 mg by mouth in the morning and at bedtime.    [provider]  ALPRAZolam Prudy Feeler) 0.25 MG tablet Take 1 tablet (0.25 mg total) by mouth 2 (two) times daily as needed for anxiety. 02/23/23   Standley Brooking, MD  aspirin 81 MG EC tablet Take 81 mg by mouth daily.    [provider]  B Complex Vitamins (B COMPLEX PO) Take 1 tablet by mouth daily with breakfast.    [provider]  carbidopa-levodopa (SINEMET IR) 25-100 MG tablet Take 1.5-2 tablets by mouth See admin instructions. Take 2 tablets by mouth at 9 AM & 12 NOON, then 1.5 tablets at 7 PM 09/18/22   [provider]  feeding supplement (ENSURE ENLIVE / ENSURE PLUS) LIQD Take 237 mLs by mouth 2 (two) times daily between meals. 01/04/21   Erick Blinks, MD  folic acid (FOLVITE) 1 MG tablet Take 1 tablet  (1 mg total) by mouth daily. 02/24/23   Standley Brooking, MD  lisinopril (ZESTRIL) 2.5 MG tablet Take 2.5 mg by mouth daily.    [provider]  Multiple Vitamins-Minerals (MULTIVITAMIN WOMEN) TABS Take 1 tablet by mouth daily with breakfast.    [provider]  naproxen sodium (ALEVE) 220 MG tablet Take 220-440 mg by mouth 2 (two) times daily as needed (for pain).    [provider]  polyethylene glycol (MIRALAX / GLYCOLAX) 17 g packet Take 17 g by mouth daily. 01/05/21   Erick Blinks, MD  rOPINIRole (REQUIP) 0.25 MG tablet Take 0.125 mg by mouth at bedtime.    [provider]  thiamine 250 MG TABS Take 250 mg by mouth daily. 02/24/23   Standley Brooking, MD      Allergies    Prednisone, Penicillins, Beta adrenergic blockers, and Sulfa antibiotics    Review of Systems   Review of Systems  Genitourinary:  Positive for flank pain and frequency.  Neurological:  Positive for weakness.  Psychiatric/Behavioral:  Positive for confusion.   All other systems reviewed and are negative.   Physical Exam Updated Vital Signs BP 115/78   Pulse 95   Temp 99.5 F (37.5 C) (Oral)   Resp 16   LMP 10/02/1990   SpO2 97%  Physical Exam Vitals and nursing note reviewed.  Constitutional:      General: She is not in acute distress.    Appearance: She is  well-developed. She is ill-appearing.  HENT:     Head: Normocephalic and atraumatic.  Eyes:     Conjunctiva/sclera: Conjunctivae normal.  Neck:     Comments: No meningismus Cardiovascular:     Rate and Rhythm: Regular rhythm. Tachycardia present.     Heart sounds: No murmur heard. Pulmonary:     Effort: Pulmonary effort is normal. No respiratory distress.     Breath sounds: Normal breath sounds.  Abdominal:     Palpations: Abdomen is soft.     Tenderness: There is abdominal tenderness. There is right CVA tenderness.  Musculoskeletal:        General: No swelling.     Cervical back: Normal range of motion  and neck supple.  Skin:    General: Skin is warm and dry.     Capillary Refill: Capillary refill takes less than 2 seconds.  Neurological:     Mental Status: She is alert.     Comments: No cranial nerve deficit.  5 out of 5 strength in the bilateral upper extremities, 5 out of 5 strength in bilateral lower extremities, intact sensation to light touch  Psychiatric:        Mood and Affect: Mood normal.     ED Results / Procedures / Treatments   Labs (all labs ordered are listed, but only abnormal results are displayed) Labs Reviewed  COMPREHENSIVE METABOLIC PANEL - Abnormal; Notable for the following components:      Result Value   CO2 21 (*)    Glucose, Bld 109 (*)    BUN 37 (*)    Creatinine, Ser 1.01 (*)    GFR, Estimated 59 (*)    All other components within normal limits  CBC WITH DIFFERENTIAL/PLATELET - Abnormal; Notable for the following components:   Neutro Abs 8.8 (*)    Lymphs Abs 0.5 (*)    All other components within normal limits  RESP PANEL BY RT-PCR (RSV, FLU A&B, COVID)  RVPGX2  CULTURE, BLOOD (ROUTINE X 2)  CULTURE, BLOOD (ROUTINE X 2)  CULTURE, BLOOD (ROUTINE X 2)  CULTURE, BLOOD (ROUTINE X 2)  PROTIME-INR  APTT  URINALYSIS, W/ REFLEX TO CULTURE (INFECTION SUSPECTED)  CBC WITH DIFFERENTIAL/PLATELET  PROCALCITONIN  CK  MAGNESIUM  PHOSPHORUS  OSMOLALITY, URINE  OSMOLALITY  CREATININE, URINE, RANDOM  SODIUM, URINE, RANDOM  PREALBUMIN  TSH  URINALYSIS, COMPLETE (UACMP) WITH MICROSCOPIC  VITAMIN B12  FOLATE  IRON AND TIBC  FERRITIN  RETICULOCYTES  I-STAT CG4 LACTIC ACID, ED    EKG EKG Interpretation Date/Time:  Tuesday May 22 2023 17:22:35 EDT Ventricular Rate:  105 PR Interval:  113 QRS Duration:  83 QT Interval:  322 QTC Calculation: 426 R Axis:   59  Text Interpretation: Sinus tachycardia Probable left atrial enlargement RSR' in V1 or V2, probably normal variant Confirmed by Ernie Avena ([id number]) on 05/22/2023 5:34:45  PM  Radiology CT ABDOMEN PELVIS W CONTRAST  Result Date: 05/22/2023 CLINICAL DATA:  Sepsis, abdominal pain, acute, non localized EXAM: CT ABDOMEN AND PELVIS WITH CONTRAST TECHNIQUE: Multidetector CT imaging of the abdomen and pelvis was performed using the standard protocol following bolus administration of intravenous contrast. RADIATION DOSE REDUCTION: This exam was performed according to the departmental dose-optimization program which includes automated exposure control, adjustment of the mA and/or kV according to patient size and/or use of iterative reconstruction technique. CONTRAST:  75mL OMNIPAQUE IOHEXOL 350 MG/ML SOLN COMPARISON:  CT abdomen and pelvis 09/29/2021 FINDINGS: Lower chest: No acute abnormality. Hepatobiliary: Unremarkable liver. Normal gallbladder.  No biliary dilation. Pancreas: Unremarkable. Spleen: Unremarkable. Adrenals/Urinary Tract: Normal adrenal glands. Geographic cortical hypoattenuation within the right kidney compatible with pyelonephritis. No urinary calculi or hydronephrosis. Bladder is unremarkable. Stomach/Bowel: Normal caliber large and small bowel. No bowel wall thickening. The appendix is normal.Stomach is within normal limits. Vascular/Lymphatic: No significant vascular findings are present. No enlarged abdominal or pelvic lymph nodes. Reproductive: Unremarkable. Other: No free intraperitoneal fluid or air. Musculoskeletal: No acute fracture. Chronic compression fracture of L4 is unchanged. IMPRESSION: Right pyelonephritis. Electronically Signed   By: Minerva Fester M.D.   On: 05/22/2023 20:06   CT HEAD WO CONTRAST ( )  Result Date: 05/22/2023 CLINICAL DATA:  Weakness with multiple falls, confusion, urinary incontinence EXAM: CT HEAD WITHOUT CONTRAST TECHNIQUE: Contiguous axial images were obtained from the base of the skull through the vertex without intravenous contrast. RADIATION DOSE REDUCTION: This exam was performed according to the departmental  dose-optimization program which includes automated exposure control, adjustment of the mA and/or kV according to patient size and/or use of iterative reconstruction technique. COMPARISON:  02/19/2023 FINDINGS: Brain: No evidence of acute infarction, hemorrhage, mass, mass effect, or midline shift. No hydrocephalus or extra-axial fluid collection. Normal cerebral volume for age. Vascular: No hyperdense vessel. Skull: Negative for fracture or focal lesion. Sinuses/Orbits: No acute finding. Other: The mastoid air cells are well aerated. IMPRESSION: No acute intracranial process. Electronically Signed   By: Wiliam Ke M.D.   On: 05/22/2023 19:27   DG Chest Port 1 View  Result Date: 05/22/2023 CLINICAL DATA:  Weakness and multiple falls. EXAM: PORTABLE CHEST 1 VIEW COMPARISON:  02/19/2023 FINDINGS: Stable cardiomediastinal silhouette. Aortic atherosclerotic calcification. No focal consolidation, pleural effusion, or pneumothorax. No displaced rib fractures. IMPRESSION: No acute cardiopulmonary disease. Electronically Signed   By: Minerva Fester M.D.   On: 05/22/2023 19:01    Procedures .Critical Care  Performed by: Ernie Avena, MD Authorized by: Ernie Avena, MD   Critical care provider statement:    Critical care time (minutes):  30   Critical care was necessary to treat or prevent imminent or life-threatening deterioration of the following conditions:  Sepsis   Critical care was time spent personally by me on the following activities:  Development of treatment plan with patient or surrogate, discussions with consultants, evaluation of patient's response to treatment, examination of patient, ordering and review of laboratory studies, ordering and review of radiographic studies, ordering and performing treatments and interventions, pulse oximetry, re-evaluation of patient's condition and review of old charts   Care discussed with: admitting provider       Medications Ordered in ED Medications   lactated ringers infusion ( Intravenous New Bag/Given 05/22/23 2148)  lactated ringers infusion (has no administration in time range)  cefTRIAXone (ROCEPHIN) 2 g in sodium chloride 0.9 % 100 mL IVPB (has no administration in time range)  lactated ringers bolus 1,000 mL (0 mLs Intravenous Stopped 05/22/23 1923)  metroNIDAZOLE (FLAGYL) IVPB 500 mg (0 mg Intravenous Stopped 05/22/23 1923)  vancomycin (VANCOCIN) IVPB 1000 mg/200 mL premix (0 mg Intravenous Stopped 05/22/23 2136)  ceFEPIme (MAXIPIME) 2 g in sodium chloride 0.9 % 100 mL IVPB (0 g Intravenous Stopped 05/22/23 2045)  acetaminophen (TYLENOL) tablet 1,000 mg (1,000 mg Oral Given 05/22/23 2030)  iohexol (OMNIPAQUE) 350 MG/ML injection 75 mL (75 mLs Intravenous Contrast Given 05/22/23 1947)    ED Course/ Medical Decision Making/ A&P  Medical Decision Making Amount and/or Complexity of Data Reviewed Labs: ordered. Radiology: ordered. ECG/medicine tests: ordered.  Risk OTC drugs. Prescription drug management. Decision regarding hospitalization.     72 year old female with medical history significant for kidney stones, hypertension who presents to the emergency department with mild confusion, fever, chills, episodes of increased urinary frequency and incontinence.  The patient endorses flank tenderness, denies any midline back pain, denies any focal weakness or saddle anesthesia.  The patient has been unsteady and has fallen over the past 24's, denies any head trauma.  She arrived GCS 14, ABC intact, alert and oriented x 3 with mild confusion present when providing history of present illness.  On arrival, the patient was febrile to 100.8, tachycardic to 105, tachypneic RR 22, saturating 100% on room air, hemodynamically stable BP 128/84.  IV access obtained and blood cultures were obtained with concern for sepsis, question pyelonephritis given the patient's urgency and frequency in the setting of fever and  tachycardia.  The patient was covered with broad-spectrum antibiotics due to unclear source.  She was covered with IV vancomycin, cefepime and Flagyl.  She had generalized abdominal tenderness.  Considered diverticulitis, pyelonephritis, nephrolithiasis, low concern for cauda equina syndrome and epidural abscess at this time patient has no focal weakness on exam.  No saddle anesthesia.  The patient was fluid resuscitated with an LR bolus.  Laboratory evaluation revealed COVID-19, influenza, RSV PCR testing negative, CMP without significant electrolyte abnormality, normal renal and liver function, CBC without leukocytosis or anemia.  Blood cultures were collected.  Lactic acid was normal at 1.1.  Chest x-ray revealed clear lungs and a CT head was unremarkable.  CT Abd Pelvis: IMPRESSION:  Right pyelonephritis.   No evidence of fracture seen on CT imaging.  Patient presenting with likely sepsis in the setting of pyelonephritis.  On repeat assessment, the patient was symptomatically proved, informed of the diagnosis of likely UTI and pyelonephritis.  Plan for hospitalist medicine for admission, Dr. Mariana Kaufman accepting.        Final Clinical Impression(s) / ED Diagnoses Final diagnoses:  Pyelonephritis  Sepsis, due to unspecified organism, unspecified whether acute organ dysfunction present [facility])    Rx / DC Orders ED Discharge Orders     None         Ernie Avena, MD 05/22/23 6295    Ernie Avena, MD 05/22/23 2333

## 2023-05-22 NOTE — Sepsis Progress Note (Signed)
One blood culture drawn before antibiotics hung

## 2023-05-22 NOTE — Progress Notes (Signed)
ED Pharmacy Antibiotic Sign Off An antibiotic consult was received from an ED provider for aztreonam and vancomycin per pharmacy dosing for sepsis. Per protocol, aztreonam switched to cefepime after clarification of pts allergy. A chart review was completed to assess appropriateness.   The following one time order(s) were placed:  Vancomycin 1000 mg IV x 1  Cefepime 2 g IV x 1   Further antibiotic and/or antibiotic pharmacy consults should be ordered by the admitting provider if indicated.   Thank you for allowing pharmacy to be a part of this patient's care.   Griffin Dakin, [facility]  Clinical Pharmacist 05/22/23 5:48 PM

## 2023-05-22 NOTE — ED Notes (Signed)
Patient transported to CT 

## 2023-05-22 NOTE — Sepsis Progress Note (Signed)
Checking with paramedic to make sure blood cultures were drawn before antibiotics hung

## 2023-05-22 NOTE — Sepsis Progress Note (Signed)
Elink monitoring for the code sepsis protocol.  

## 2023-05-23 ENCOUNTER — Other Ambulatory Visit: Payer: Self-pay

## 2023-05-23 DIAGNOSIS — N12 Tubulo-interstitial nephritis, not specified as acute or chronic: Principal | ICD-10-CM | POA: Diagnosis present

## 2023-05-23 DIAGNOSIS — N10 Acute tubulo-interstitial nephritis: Secondary | ICD-10-CM

## 2023-05-23 DIAGNOSIS — A419 Sepsis, unspecified organism: Secondary | ICD-10-CM | POA: Diagnosis not present

## 2023-05-23 DIAGNOSIS — B9620 Unspecified Escherichia coli [E. coli] as the cause of diseases classified elsewhere: Secondary | ICD-10-CM | POA: Diagnosis not present

## 2023-05-23 DIAGNOSIS — R6520 Severe sepsis without septic shock: Secondary | ICD-10-CM | POA: Diagnosis not present

## 2023-05-23 LAB — BLOOD CULTURE ID PANEL (REFLEXED) - BCID2

## 2023-05-23 LAB — CBC WITH DIFFERENTIAL/PLATELET
Abs Immature Granulocytes: 0.03 10*3/uL (ref 0.00–0.07)
Basophils Absolute: 0 10*3/uL (ref 0.0–0.1)
Basophils Relative: 0 %
Eosinophils Absolute: 0 10*3/uL (ref 0.0–0.5)
Eosinophils Relative: 0 %
HCT: 35.4 % — ABNORMAL LOW (ref 36.0–46.0)
Hemoglobin: 11.4 g/dL — ABNORMAL LOW (ref 12.0–15.0)
Immature Granulocytes: 0 %
Lymphocytes Relative: 7 %
Lymphs Abs: 0.5 10*3/uL — ABNORMAL LOW (ref 0.7–4.0)
MCH: 30.8 pg (ref 26.0–34.0)
MCHC: 32.2 g/dL (ref 30.0–36.0)
MCV: 95.7 fL (ref 80.0–100.0)
Monocytes Absolute: 0.5 10*3/uL (ref 0.1–1.0)
Monocytes Relative: 7 %
Neutro Abs: 6.3 10*3/uL (ref 1.7–7.7)
Neutrophils Relative %: 86 %
Platelets: 179 10*3/uL (ref 150–400)
RBC: 3.7 MIL/uL — ABNORMAL LOW (ref 3.87–5.11)
RDW: 12.4 % (ref 11.5–15.5)
WBC: 7.4 10*3/uL (ref 4.0–10.5)
nRBC: 0 % (ref 0.0–0.2)

## 2023-05-23 LAB — COMPREHENSIVE METABOLIC PANEL
ALT: 9 U/L (ref 0–44)
AST: 20 U/L (ref 15–41)
Albumin: 2.9 g/dL — ABNORMAL LOW (ref 3.5–5.0)
Alkaline Phosphatase: 74 U/L (ref 38–126)
Anion gap: 12 (ref 5–15)
BUN: 25 mg/dL — ABNORMAL HIGH (ref 8–23)
CO2: 20 mmol/L — ABNORMAL LOW (ref 22–32)
Calcium: 8.6 mg/dL — ABNORMAL LOW (ref 8.9–10.3)
Chloride: 106 mmol/L (ref 98–111)
Creatinine, Ser: 0.98 mg/dL (ref 0.44–1.00)
GFR, Estimated: >60 mL/min (ref >60)
Glucose, Bld: 101 mg/dL — ABNORMAL HIGH (ref 70–99)
Potassium: 3.7 mmol/L (ref 3.5–5.1)
Sodium: 138 mmol/L (ref 135–145)
Total Bilirubin: 0.6 mg/dL (ref 0.3–1.2)
Total Protein: 5.6 g/dL — ABNORMAL LOW (ref 6.5–8.1)

## 2023-05-23 LAB — FOLATE: Folate: 25.3 ng/mL (ref >5.9)

## 2023-05-23 LAB — IRON AND TIBC
Iron: 8 ug/dL — ABNORMAL LOW (ref 28–170)
Saturation Ratios: 3 % — ABNORMAL LOW (ref 10.4–31.8)
TIBC: 232 ug/dL — ABNORMAL LOW (ref 250–450)
UIBC: 224 ug/dL

## 2023-05-23 LAB — PROCALCITONIN: Procalcitonin: 3.88 ng/mL

## 2023-05-23 LAB — VITAMIN B12: Vitamin B-12: 2274 pg/mL — ABNORMAL HIGH (ref 180–914)

## 2023-05-23 LAB — RETICULOCYTES
Immature Retic Fract: 14.9 % (ref 2.3–15.9)
RBC.: 3.66 MIL/uL — ABNORMAL LOW (ref 3.87–5.11)
Retic Count, Absolute: 34 10*3/uL (ref 19.0–186.0)
Retic Ct Pct: 0.9 % (ref 0.4–3.1)

## 2023-05-23 LAB — PREALBUMIN: Prealbumin: 9 mg/dL — ABNORMAL LOW (ref 18–38)

## 2023-05-23 LAB — MAGNESIUM: Magnesium: 2.2 mg/dL (ref 1.7–2.4)

## 2023-05-23 LAB — PHOSPHORUS: Phosphorus: 2.2 mg/dL — ABNORMAL LOW (ref 2.5–4.6)

## 2023-05-23 LAB — OSMOLALITY: Osmolality: 300 mosm/kg — ABNORMAL HIGH (ref 275–295)

## 2023-05-23 LAB — CK: Total CK: 487 U/L — ABNORMAL HIGH (ref 38–234)

## 2023-05-23 LAB — FERRITIN: Ferritin: 125 ng/mL (ref 11–307)

## 2023-05-23 LAB — TSH: TSH: 0.774 u[IU]/mL (ref 0.350–4.500)

## 2023-05-23 MED ORDER — SODIUM CHLORIDE 0.9 % IV SOLN: INTRAVENOUS | Status: AC

## 2023-05-23 MED ORDER — FOLIC ACID 1 MG PO TABS
1.0000 mg | ORAL_TABLET | Freq: Every day | ORAL | Status: DC
  Administered 2023-05-23 – 2023-05-31 (×9): 1 mg via ORAL
  Filled 2023-05-23 (×9): qty 1

## 2023-05-23 MED ORDER — MIRTAZAPINE 15 MG PO TABS
7.5000 mg | ORAL_TABLET | Freq: Every day | ORAL | Status: DC
  Administered 2023-05-23 – 2023-05-28 (×6): 7.5 mg via ORAL
  Filled 2023-05-23 (×7): qty 1

## 2023-05-23 MED ORDER — LISINOPRIL 2.5 MG PO TABS
2.5000 mg | ORAL_TABLET | Freq: Every day | ORAL | Status: DC
  Administered 2023-05-23 – 2023-05-31 (×9): 2.5 mg via ORAL
  Filled 2023-05-23 (×9): qty 1

## 2023-05-23 MED ORDER — ALPRAZOLAM 0.5 MG PO TABS
0.2500 mg | ORAL_TABLET | Freq: Two times a day (BID) | ORAL | Status: DC
  Administered 2023-05-23 – 2023-05-31 (×17): 0.25 mg via ORAL
  Filled 2023-05-23 (×18): qty 1

## 2023-05-23 MED ORDER — MAGNESIUM OXIDE -MG SUPPLEMENT 400 (240 MG) MG PO TABS
200.0000 mg | ORAL_TABLET | Freq: Every day | ORAL | Status: DC
  Administered 2023-05-23 – 2023-05-31 (×9): 200 mg via ORAL
  Filled 2023-05-23 (×9): qty 1

## 2023-05-23 MED ORDER — CALCIUM CARBONATE 1250 (500 CA) MG PO TABS
600.0000 mg | ORAL_TABLET | Freq: Every day | ORAL | Status: DC
  Administered 2023-05-23 – 2023-05-31 (×9): 625 mg via ORAL
  Filled 2023-05-23 (×10): qty 1

## 2023-05-23 MED ORDER — THIAMINE MONONITRATE 100 MG PO TABS
200.0000 mg | ORAL_TABLET | Freq: Every day | ORAL | Status: DC
  Administered 2023-05-23 – 2023-05-31 (×9): 200 mg via ORAL
  Filled 2023-05-23 (×12): qty 2

## 2023-05-23 MED ORDER — POLYETHYLENE GLYCOL 3350 17 G PO PACK
17.0000 g | PACK | Freq: Every day | ORAL | Status: DC
  Administered 2023-05-23 – 2023-05-31 (×9): 17 g via ORAL
  Filled 2023-05-23 (×9): qty 1

## 2023-05-23 NOTE — Progress Notes (Signed)
PHARMACY - PHYSICIAN COMMUNICATION CRITICAL VALUE ALERT - BLOOD CULTURE IDENTIFICATION (BCID)  Hannah Fisher is an 72 y.o. female who presented to [facility] on 05/22/2023 with a chief complaint of weakness, falls, confusion, urinary incontinence.  Assessment: Blood culture 1/4 bottles with E.coli, no resistance detected. Suspected urinary source.  Name of physician (or Provider) Contacted: Marcellus Scott, MD  Current antibiotics: ceftriaxone  Changes to prescribed antibiotics recommended:  Continue ceftriaxone monotherapy.  Results for orders placed or performed during the hospital encounter of 05/22/23  Blood Culture ID Panel (Reflexed) (Collected: 05/22/2023  5:34 PM)  Result Value Ref Range   Enterococcus faecalis NOT DETECTED NOT DETECTED   Enterococcus Faecium NOT DETECTED NOT DETECTED   Listeria monocytogenes NOT DETECTED NOT DETECTED   Staphylococcus species NOT DETECTED NOT DETECTED   Staphylococcus aureus (BCID) NOT DETECTED NOT DETECTED   Staphylococcus epidermidis NOT DETECTED NOT DETECTED   Staphylococcus lugdunensis NOT DETECTED NOT DETECTED   Streptococcus species NOT DETECTED NOT DETECTED   Streptococcus agalactiae NOT DETECTED NOT DETECTED   Streptococcus pneumoniae NOT DETECTED NOT DETECTED   Streptococcus pyogenes NOT DETECTED NOT DETECTED   A.calcoaceticus-baumannii NOT DETECTED NOT DETECTED   Bacteroides fragilis NOT DETECTED NOT DETECTED   Enterobacterales DETECTED (A) NOT DETECTED   Enterobacter cloacae complex NOT DETECTED NOT DETECTED   Escherichia coli DETECTED (A) NOT DETECTED   Klebsiella aerogenes NOT DETECTED NOT DETECTED   Klebsiella oxytoca NOT DETECTED NOT DETECTED   Klebsiella pneumoniae NOT DETECTED NOT DETECTED   Proteus species NOT DETECTED NOT DETECTED   Salmonella species NOT DETECTED NOT DETECTED   Serratia marcescens NOT DETECTED NOT DETECTED   Haemophilus influenzae NOT DETECTED NOT DETECTED   Neisseria meningitidis NOT DETECTED NOT  DETECTED   Pseudomonas aeruginosa NOT DETECTED NOT DETECTED   Stenotrophomonas maltophilia NOT DETECTED NOT DETECTED   Candida albicans NOT DETECTED NOT DETECTED   Candida auris NOT DETECTED NOT DETECTED   Candida glabrata NOT DETECTED NOT DETECTED   Candida krusei NOT DETECTED NOT DETECTED   Candida parapsilosis NOT DETECTED NOT DETECTED   Candida tropicalis NOT DETECTED NOT DETECTED   Cryptococcus neoformans/gattii NOT DETECTED NOT DETECTED   CTX-M ESBL NOT DETECTED NOT DETECTED   Carbapenem resistance IMP NOT DETECTED NOT DETECTED   Carbapenem resistance KPC NOT DETECTED NOT DETECTED   Carbapenem resistance NDM NOT DETECTED NOT DETECTED   Carbapenem resist OXA 48 LIKE NOT DETECTED NOT DETECTED   Carbapenem resistance VIM NOT DETECTED NOT DETECTED    Romie Minus, PharmD PGY1 Pharmacy Resident  Please check AMION for all [facility] Pharmacy phone numbers After 10:00 PM, call Main Pharmacy [phone]

## 2023-05-23 NOTE — Plan of Care (Signed)

## 2023-05-23 NOTE — Progress Notes (Signed)
Patient arrived to the unit via stretcher from ED. A/O x 3. VSS. No pain complained at this time. Oriented patient to the room and staff. Education provided regarding safety precaution and patient verbalize understanding. Daughter is at the bedside.

## 2023-05-23 NOTE — Evaluation (Signed)
Physical Therapy Evaluation Patient Details Name: ERLINE KINCHELOE MRN: [id number] DOB: 1951-04-06 Today's Date: 05/23/2023  History of Present Illness  Pt is a 72 y/o female presenting with frequent falls, progressive weakness, fever and confusion in setting of E. Coli bacteremia/sepsis from R acute pyelonephritis and acute on chronic metabolic encephalopathy. PMH: Parkinson's disease, HTN, cognitive impairment.  Clinical Impression  Pt admitted with/for the above problems stemming in part from sepsis.  Pt needing moderate assist for basic mobility.  Pt currently limited functionally due to the problems listed. ( See problems list.)   Pt will benefit from PT to maximize function and safety in order to get ready for next venue listed below.         If plan is discharge home, recommend the following: A lot of help with walking and/or transfers;A lot of help with bathing/dressing/bathroom;Direct supervision/assist for medications management;Direct supervision/assist for financial management;Assistance with cooking/housework   Can travel by private vehicle   No    Equipment Recommendations Other (comment) (TBD)  Recommendations for Other Services       Functional Status Assessment Patient has had a recent decline in their functional status and demonstrates the ability to make significant improvements in function in a reasonable and predictable amount of time.     Precautions / Restrictions Precautions Precautions: Fall Restrictions Weight Bearing Restrictions: No      Mobility  Bed Mobility Overal bed mobility: Needs Assistance Bed Mobility: Supine to Sit, Sit to Supine     Supine to sit: Mod assist, HOB elevated, Used rails Sit to supine: Mod assist        Transfers Overall transfer level: Needs assistance Equipment used: Rolling walker (2 wheels) Transfers: Sit to/from Stand, Bed to chair/wheelchair/BSC Sit to Stand: Mod assist   Step pivot transfers: Mod assist        General transfer comment: stood EOB x2, side stepping to [facility] with difficulty, pt not following direction well.    Ambulation/Gait                  Stairs            Wheelchair Mobility     Tilt Bed    Modified Rankin (Stroke Patients Only)       Balance Overall balance assessment: Needs assistance Sitting-balance support: Feet supported, No upper extremity supported Sitting balance-Leahy Scale: Fair     Standing balance support: Bilateral upper extremity supported, During functional activity Standing balance-Leahy Scale: Poor Standing balance comment: reliant on external support                             Pertinent Vitals/Pain Pain Assessment Pain Assessment: Faces Faces Pain Scale: No hurt Pain Intervention(s): Monitored during session    Home Living Family/patient expects to be discharged to:: Other (Comment) (ILF)                   Additional Comments: from [facility]    Prior Function Prior Level of Function : Needs assist             Mobility Comments: uses RW most of the time for mobility, frequent falls (often tripping) ADLs Comments: Reports MOD I for ADLs, unsure of IADL abilities. daughter reports pt would wear same clothes for days in a row at times     Extremity/Trunk Assessment        Lower Extremity Assessment Lower Extremity Assessment: Generalized weakness  Cervical / Trunk Assessment Cervical / Trunk Assessment: Normal  Communication   Communication Communication: Difficulty following commands/understanding;Difficulty communicating thoughts/reduced clarity of speech  Cognition Arousal: Alert Behavior During Therapy: Flat affect, Restless, Impulsive Overall Cognitive Status: History of cognitive impairments - at baseline                                 General Comments: hx of Parkinson's, poor safety awareness and awareness of deficits        General Comments       Exercises     Assessment/Plan    PT Assessment Patient needs continued PT services  PT Problem List Decreased strength;Decreased activity tolerance;Decreased balance;Decreased mobility;Decreased coordination;Decreased cognition       PT Treatment Interventions Gait training;Functional mobility training;Therapeutic activities;Therapeutic exercise;Balance training;Patient/family education;DME instruction    PT Goals (Current goals can be found in the Care Plan section)  Acute Rehab PT Goals PT Goal Formulation: Patient unable to participate in goal setting Time For Goal Achievement: 06/06/23 Potential to Achieve Goals: Good    Frequency Min 1X/week     Co-evaluation               AM-PAC PT "6 Clicks" Mobility  Outcome Measure Help needed turning from your back to your side while in a flat bed without using bedrails?: A Lot Help needed moving from lying on your back to sitting on the side of a flat bed without using bedrails?: A Lot Help needed moving to and from a bed to a chair (including a wheelchair)?: A Lot Help needed standing up from a chair using your arms (e.g., wheelchair or bedside chair)?: A Lot Help needed to walk in hospital room?: A Lot Help needed climbing 3-5 steps with a railing? : A Lot 6 Click Score: 12    End of Session   Activity Tolerance: Patient tolerated treatment well Patient left: in bed;with call bell/phone within reach;with bed alarm set Nurse Communication: Mobility status PT Visit Diagnosis: Unsteadiness on feet (R26.81);Muscle weakness (generalized) (M62.81)    Time: 0981-1914 PT Time Calculation (min) (ACUTE ONLY): 18 min   Charges:   PT Evaluation $PT Eval Moderate Complexity: 1 Mod   PT General Charges $$ ACUTE PT VISIT: 1 Visit         05/23/2023  Jacinto Halim., PT Acute Rehabilitation Services [phone]  (office)  Eliseo Gum Livianna Petraglia 05/23/2023, 6:38 PM

## 2023-05-23 NOTE — Evaluation (Signed)
Occupational Therapy Evaluation Patient Details Name: Hannah Fisher MRN: [id number] DOB: [birth year] Today's Date: 05/23/2023   History of Present Illness Pt is a 72 y/o female presenting with frequent falls, progressive weakness, fever and confusion in setting of E. Coli bacteremia/sepsis from R acute pyelonephritis and acute on chronic metabolic encephalopathy. PMH: Parkinson's disease, HTN, cognitive impairment.   Clinical Impression   PTA, pt from [facility], typically ambulatory with RW and able to manage ADLs. Pt with recent hx of frequent falls. Pt presents now with deficits in cognition, safety awareness, standing balance and strength. Pt requires Mod A for BSC transfers and mobility with RW - frequent cues for DME use, appropriate step length and safety needed. Pt requires Min A for UB ADL and up to Max A for LB ADLs. Based on high fall risk, recommend continued inpatient follow up therapy, <3 hours/day        If plan is discharge home, recommend the following: A lot of help with walking and/or transfers;A lot of help with bathing/dressing/bathroom;Direct supervision/assist for medications management;Direct supervision/assist for financial management;Assistance with cooking/housework    Functional Status Assessment  Patient has had a recent decline in their functional status and demonstrates the ability to make significant improvements in function in a reasonable and predictable amount of time.  Equipment Recommendations  None recommended by OT    Recommendations for Other Services       Precautions / Restrictions Precautions Precautions: Fall Restrictions Weight Bearing Restrictions: No      Mobility Bed Mobility Overal bed mobility: Needs Assistance Bed Mobility: Supine to Sit, Sit to Supine     Supine to sit: Mod assist, HOB elevated, Used rails Sit to supine: Mod assist        Transfers Overall transfer level: Needs assistance Equipment used:  Rolling walker (2 wheels) Transfers: Sit to/from Stand, Bed to chair/wheelchair/BSC Sit to Stand: Mod assist     Step pivot transfers: Mod assist     General transfer comment: to [facility] with face to face transfer. RW to bathroom and Mod A to stand from toilet      Balance Overall balance assessment: Needs assistance Sitting-balance support: Feet supported, No upper extremity supported Sitting balance-Leahy Scale: Fair     Standing balance support: Bilateral upper extremity supported, During functional activity Standing balance-Leahy Scale: Poor                             ADL either performed or assessed with clinical judgement   ADL Overall ADL's : Needs assistance/impaired Eating/Feeding: Set up;Sitting   Grooming: Minimal assistance;Standing   Upper Body Bathing: Minimal assistance;Sitting   Lower Body Bathing: Moderate assistance;Sitting/lateral leans;Sit to/from stand   Upper Body Dressing : Minimal assistance;Sitting   Lower Body Dressing: Maximal assistance;Sit to/from stand;Sitting/lateral leans   Toilet Transfer: Moderate assistance;Ambulation;Regular Toilet;Rolling walker (2 wheels);+2 for safety/equipment Toilet Transfer Details (indicate cue type and reason): Mod A for pivot to [facility] though pt standing unsafely from [facility] requesting to use toilet - unable to redirect. +2 for safety in line mgmt for mobility to bathroom and assist for RW use, cues for appropriate step length Toileting- Clothing Manipulation and Hygiene: Moderate assistance;Sitting/lateral lean;Sit to/from stand Toileting - Clothing Manipulation Details (indicate cue type and reason): unable to perform adequate hygiene, requested to stand for task requiring Mod A for balance while pt reaching to wipe. complaining of constipation with increased time needed for task. OT ultimately assisting  with hygiene using wet washcloth     Functional mobility during ADLs: Moderate assistance;+2 for  safety/equipment;Cueing for sequencing;Cueing for safety;Rolling walker (2 wheels) General ADL Comments: Constant hands on assist and cues needed for safety/DME mgmt     Vision Ability to See in Adequate Light: 0 Adequate Patient Visual Report: No change from baseline Vision Assessment?: No apparent visual deficits     Perception         Praxis         Pertinent Vitals/Pain Pain Assessment Pain Assessment: No/denies pain     Extremity/Trunk Assessment Upper Extremity Assessment Upper Extremity Assessment: Generalized weakness   Lower Extremity Assessment Lower Extremity Assessment: Defer to PT evaluation   Cervical / Trunk Assessment Cervical / Trunk Assessment: Normal   Communication Communication Communication: Difficulty following commands/understanding;Difficulty communicating thoughts/reduced clarity of speech Following commands: Follows one step commands with increased time Cueing Techniques: Verbal cues;Gestural cues;Tactile cues   Cognition Arousal: Alert Behavior During Therapy: Flat affect, Restless, Impulsive Overall Cognitive Status: History of cognitive impairments - at baseline                                 General Comments: hx of Parkinson's, poor safety awareness and awareness of deficits     General Comments  Daughter at bedside, hands on to assist    Exercises     Shoulder Instructions      Home Living Family/patient expects to be discharged to:: Other (Comment) (ILF)                                 Additional Comments: from [facility]      Prior Functioning/Environment Prior Level of Function : Needs assist             Mobility Comments: uses RW most of the time for mobility, frequent falls (often tripping) ADLs Comments: Reports MOD I for ADLs, unsure of IADL abilities. daughter reports pt would wear same clothes for days in a row at times        OT Problem List: Decreased  strength;Decreased activity tolerance;Impaired balance (sitting and/or standing);Decreased cognition;Decreased safety awareness;Decreased knowledge of use of DME or AE      OT Treatment/Interventions: Self-care/ADL training;Therapeutic exercise;Energy conservation;DME and/or AE instruction;Therapeutic activities;Patient/family education    OT Goals(Current goals can be found in the care plan section) Acute Rehab OT Goals Patient Stated Goal: decrease falls, go to bathroom OT Goal Formulation: With patient/family Time For Goal Achievement: 06/13/23 Potential to Achieve Goals: Good  OT Frequency: Min 1X/week    Co-evaluation              AM-PAC OT "6 Clicks" Daily Activity     Outcome Measure Help from another person eating meals?: None Help from another person taking care of personal grooming?: A Little Help from another person toileting, which includes using toliet, bedpan, or urinal?: A Lot Help from another person bathing (including washing, rinsing, drying)?: A Lot Help from another person to put on and taking off regular upper body clothing?: A Little Help from another person to put on and taking off regular lower body clothing?: A Lot 6 Click Score: 16   End of Session Equipment Utilized During Treatment: Gait belt;Rolling walker (2 wheels) Nurse Communication: Mobility status  Activity Tolerance: Patient tolerated treatment well Patient left: in bed;with call bell/phone within reach;with  family/visitor present  OT Visit Diagnosis: Unsteadiness on feet (R26.81);Other abnormalities of gait and mobility (R26.89);Muscle weakness (generalized) (M62.81)                Time: 1610-9604 OT Time Calculation (min): 52 min Charges:  OT General Charges $OT Visit: 1 Visit OT Evaluation $OT Eval Moderate Complexity: 1 Mod OT Treatments $Self Care/Home Management : 23-37 mins  Bradd Canary, OTR/L Acute Rehab Services Office: [phone]   Lorre Munroe 05/23/2023, 1:55 PM

## 2023-05-23 NOTE — Assessment & Plan Note (Signed)
>>  ASSESSMENT AND PLAN FOR HYPERTENSION WRITTEN ON 05/23/2023 12:22 AM BY DOUTOVA, ANASTASSIA, MD  Allow permissive hypertension for tonight

## 2023-05-23 NOTE — Assessment & Plan Note (Signed)
In the setting of sepsis and urinary tract infection will rehydrate Treat underlying infection.  If persists may need further imaging such as MRI

## 2023-05-23 NOTE — Progress Notes (Signed)
PROGRESS NOTE   Hannah Fisher  GNF:[id number]    DOB: Jun 12, 1951    DOA: 05/22/2023  PCP: Daisy Floro, MD   I have briefly reviewed patients previous medical records in [facility].  Chief Complaint  Patient presents with   Weakness   Altered Mental Status    Brief Narrative:  72 year old female, resident of [facility], PMH of Parkinson's disease, deafness, cognitive impairment, HTN, presented to the ED on 05/22/2023 with complaints of fever, fatigue, falls, and increased confusion.  Reportedly had a similar admission in May 2024.  Admitted for sepsis due to E. coli bacteremia from right acute pyelonephritis and acute on chronic metabolic encephalopathy.   Assessment & Plan:  Principal Problem:   Sepsis (HCC) Active Problems:   Hypertension   Acute encephalopathy   Parkinson disease   Pyelonephritis   Suspected due to E. coli bacteremia, sepsis from right-sided pyelonephritis: Patient is a poor historian and cannot get history of UTI symptoms from her. Met sepsis criteria on admission: Temperature of 100.8, transient tachypnea of 22/min, persistent tachycardia for a couple of hours with heart rates up to 119/min, confirmed source based on CT abdomen with pyelonephritis and treating with IV antibiotics.  Had target organ dysfunction as evidenced by acute on chronic AMS. Unfortunately no urine microscopy yet, requested again Blood cultures 1/4 confirms E. coli, sensitivities pending.  Lactate normal.  Procalcitonin elevated at 3.88. CT abdomen showed right pyelonephritis. Continue IV ceftriaxone.  Acute metabolic encephalopathy complicating underlying chronic encephalopathy: AMS appears to be clearly worse than her prior baseline. Oriented only to self and feels that she is in kitchen right now CT head without acute findings Likely secondary to pyelonephritis and sepsis. Treatment as above and monitor. Delirium precautions.  Normocytic anemia: Unclear  why anemia panel was requested.  Folate 25.3, ferritin 125, B12: 2274. Outpatient follow-up.  Falls: PT and OT evaluation.  Hypertension: Review home meds and consider resuming home antihypertensives.  Parkinson's disease: Continue home Sinemet doses.   There is no height or weight on file to calculate BMI.  ACP Documents: None present DVT prophylaxis: SCDs Start: 05/22/23 2338     Code Status: Full Code:  Family Communication: None at bedside Disposition:  Status is: Inpatient Remains inpatient appropriate because: IV antibiotics     Consultants:     Procedures:     Antimicrobials:   IV ceftriaxone 8/20 >   Subjective:  This morning in ED.  Alert and oriented only to self.  Feels that she is in the kitchen.  Denies pain anywhere.  Denies UTI symptoms.  Asking for Sprite or Dr. Reino Kent to drink.  Objective:   Vitals:   05/23/23 0710 05/23/23 0715 05/23/23 0730 05/23/23 0808  BP:  131/83 124/70   Pulse: 81 84 98   Resp: 17 16 17    Temp:    98.3 F (36.8 C)  TempSrc:    Oral  SpO2: 99% 100% 99%     General exam: Elderly female, moderately built and thinly nourished lying comfortably propped up in bed without distress.  Oral mucosa dry. Respiratory system: Clear to auscultation. Respiratory effort normal. Cardiovascular system: S1 & S2 heard, RRR. No JVD, murmurs, rubs, gallops or clicks. No pedal edema.  Telemetry personally reviewed: Sinus rhythm, discontinued telemetry. Gastrointestinal system: Abdomen is nondistended, soft and nontender. No organomegaly or masses felt. Normal bowel sounds heard.  No renal angle tenderness. Central nervous system: Alert and oriented to self only. No focal neurological deficits.  Extremities: Symmetric 5 x 5 power. Skin: No rashes, lesions or ulcers Psychiatry: Judgement and insight impaired. Mood & affect appropriate.     Data Reviewed:   I have personally reviewed following labs and imaging studies   CBC: Recent Labs   Lab June 15, 2023 1801 05/23/23 0307  WBC 9.8 7.4  NEUTROABS 8.8* 6.3  HGB 13.1 11.4*  HCT 40.6 35.4*  MCV 93.5 95.7  PLT 207 179    Basic Metabolic Panel: Recent Labs  Lab June 15, 2023 1801 05/23/23 0305  NA 137 138  K 3.9 3.7  CL 102 106  CO2 21* 20*  GLUCOSE 109* 101*  BUN 37* 25*  CREATININE 1.01* 0.98  CALCIUM 9.5 8.6*  MG  --  2.2  PHOS  --  2.2*    Liver Function Tests: Recent Labs  Lab 2023/06/15 1801 05/23/23 0305  AST 23 20  ALT <5 9  ALKPHOS 106 74  BILITOT 0.9 0.6  PROT 7.4 5.6*  ALBUMIN 3.8 2.9*    CBG: No results for input(s): "GLUCAP" in the last 168 hours.  Microbiology Studies:   Recent Results (from the past 240 hour(s))  Blood Culture (routine x 2)     Status: None (Preliminary result)   Collection Time: 06/15/2023  5:34 PM   Specimen: BLOOD  Result Value Ref Range Status   Specimen Description BLOOD RIGHT ANTECUBITAL  Final   Special Requests   Final    BOTTLES DRAWN AEROBIC AND ANAEROBIC Blood Culture adequate volume   Culture  Setup Time   Final    GRAM NEGATIVE RODS ANAEROBIC BOTTLE ONLY Organism ID to follow CRITICAL RESULT CALLED TO, READ BACK BY AND VERIFIED WITHMargarita Sermons, AT 1610 05/23/23 Renato Shin Performed at [facility], [address] [place], [place] [address]    Culture GRAM NEGATIVE RODS  Final   Report Status PENDING  Incomplete  Blood Culture ID Panel (Reflexed)     Status: Abnormal   Collection Time: 06-15-2023  5:34 PM  Result Value Ref Range Status   Enterococcus faecalis NOT DETECTED NOT DETECTED Final   Enterococcus Faecium NOT DETECTED NOT DETECTED Final   Listeria monocytogenes NOT DETECTED NOT DETECTED Final   Staphylococcus species NOT DETECTED NOT DETECTED Final   Staphylococcus aureus (BCID) NOT DETECTED NOT DETECTED Final   Staphylococcus epidermidis NOT DETECTED NOT DETECTED Final   Staphylococcus lugdunensis NOT DETECTED NOT DETECTED Final   Streptococcus species NOT DETECTED NOT DETECTED  Final   Streptococcus agalactiae NOT DETECTED NOT DETECTED Final   Streptococcus pneumoniae NOT DETECTED NOT DETECTED Final   Streptococcus pyogenes NOT DETECTED NOT DETECTED Final   A.calcoaceticus-baumannii NOT DETECTED NOT DETECTED Final   Bacteroides fragilis NOT DETECTED NOT DETECTED Final   Enterobacterales DETECTED (A) NOT DETECTED Final    Comment: Enterobacterales represent a large order of gram negative bacteria, not a single organism. CRITICAL RESULT CALLED TO, READ BACK BY AND VERIFIED WITH: S. MOODY PHARMD, AT 4098 05/23/23 D. VANHOOK    Enterobacter cloacae complex NOT DETECTED NOT DETECTED Final   Escherichia coli DETECTED (A) NOT DETECTED Final    Comment: CRITICAL RESULT CALLED TO, READ BACK BY AND VERIFIED WITH: S. MOODY PHARMD, AT 1191 05/23/23 D. VANHOOK    Klebsiella aerogenes NOT DETECTED NOT DETECTED Final   Klebsiella oxytoca NOT DETECTED NOT DETECTED Final   Klebsiella pneumoniae NOT DETECTED NOT DETECTED Final   Proteus species NOT DETECTED NOT DETECTED Final   Salmonella species NOT DETECTED NOT DETECTED Final  Serratia marcescens NOT DETECTED NOT DETECTED Final   Haemophilus influenzae NOT DETECTED NOT DETECTED Final   Neisseria meningitidis NOT DETECTED NOT DETECTED Final   Pseudomonas aeruginosa NOT DETECTED NOT DETECTED Final   Stenotrophomonas maltophilia NOT DETECTED NOT DETECTED Final   Candida albicans NOT DETECTED NOT DETECTED Final   Candida auris NOT DETECTED NOT DETECTED Final   Candida glabrata NOT DETECTED NOT DETECTED Final   Candida krusei NOT DETECTED NOT DETECTED Final   Candida parapsilosis NOT DETECTED NOT DETECTED Final   Candida tropicalis NOT DETECTED NOT DETECTED Final   Cryptococcus neoformans/gattii NOT DETECTED NOT DETECTED Final   CTX-M ESBL NOT DETECTED NOT DETECTED Final   Carbapenem resistance IMP NOT DETECTED NOT DETECTED Final   Carbapenem resistance KPC NOT DETECTED NOT DETECTED Final   Carbapenem resistance NDM NOT  DETECTED NOT DETECTED Final   Carbapenem resist OXA 48 LIKE NOT DETECTED NOT DETECTED Final   Carbapenem resistance VIM NOT DETECTED NOT DETECTED Final    Comment: Performed at [facility], [address], [place], [place] [address]  Resp panel by RT-PCR (RSV, Flu A&B, Covid) Anterior Nasal Swab     Status: None   Collection Time: 05/22/23  6:54 PM   Specimen: Anterior Nasal Swab  Result Value Ref Range Status   SARS Coronavirus 2 by RT PCR NEGATIVE NEGATIVE Final   Influenza A by PCR NEGATIVE NEGATIVE Final   Influenza B by PCR NEGATIVE NEGATIVE Final    Comment: (NOTE) The Xpert Xpress SARS-CoV-2/FLU/RSV plus assay is intended as an aid in the diagnosis of influenza from Nasopharyngeal swab specimens and should not be used as a sole basis for treatment. Nasal washings and aspirates are unacceptable for Xpert Xpress SARS-CoV-2/FLU/RSV testing.  Fact Sheet for Patients: BloggerCourse.com  Fact Sheet for Healthcare Providers: SeriousBroker.it  This test is not yet approved or cleared by the [place] FDA and has been authorized for detection and/or diagnosis of SARS-CoV-2 by FDA under an Emergency Use Authorization (EUA). This EUA will remain in effect (meaning this test can be used) for the duration of the COVID-19 declaration under Section 564(b)(1) of the Act, 21 U.S.C. section 360bbb-3(b)(1), unless the authorization is terminated or revoked.     Resp Syncytial Virus by PCR NEGATIVE NEGATIVE Final    Comment: (NOTE) Fact Sheet for Patients: BloggerCourse.com  Fact Sheet for Healthcare Providers: SeriousBroker.it  This test is not yet approved or cleared by the [place] FDA and has been authorized for detection and/or diagnosis of SARS-CoV-2 by FDA under an Emergency Use Authorization (EUA). This EUA will remain in effect (meaning this test can be used) for  the duration of the COVID-19 declaration under Section 564(b)(1) of the Act, 21 U.S.C. section 360bbb-3(b)(1), unless the authorization is terminated or revoked.  Performed at [facility], [address], [place], [place] [address]   Blood Culture (routine x 2)     Status: None (Preliminary result)   Collection Time: 05/22/23  8:34 PM   Specimen: BLOOD  Result Value Ref Range Status   Specimen Description BLOOD RIGHT ANTECUBITAL  Final   Special Requests   Final    BOTTLES DRAWN AEROBIC AND ANAEROBIC Blood Culture adequate volume   Culture   Final    NO GROWTH < 12 HOURS Performed at [facility], [address], [place], [place] [address]    Report Status PENDING  Incomplete    Radiology Studies:  CT ABDOMEN PELVIS W CONTRAST  Result Date: 05/22/2023 CLINICAL DATA:  Sepsis, abdominal pain, acute, non localized EXAM: CT ABDOMEN AND PELVIS WITH CONTRAST TECHNIQUE: Multidetector CT imaging of the abdomen and pelvis was performed using the standard protocol following bolus administration of intravenous contrast. RADIATION DOSE REDUCTION: This exam was performed according to the departmental dose-optimization program which includes automated exposure control, adjustment of the mA and/or kV according to patient size and/or use of iterative reconstruction technique. CONTRAST:  75mL OMNIPAQUE IOHEXOL 350 MG/ML SOLN COMPARISON:  CT abdomen and pelvis 09/29/2021 FINDINGS: Lower chest: No acute abnormality. Hepatobiliary: Unremarkable liver. Normal gallbladder. No biliary dilation. Pancreas: Unremarkable. Spleen: Unremarkable. Adrenals/Urinary Tract: Normal adrenal glands. Geographic cortical hypoattenuation within the right kidney compatible with pyelonephritis. No urinary calculi or hydronephrosis. Bladder is unremarkable. Stomach/Bowel: Normal caliber large and small bowel. No bowel wall thickening. The appendix is normal.Stomach is within normal limits. Vascular/Lymphatic: No  significant vascular findings are present. No enlarged abdominal or pelvic lymph nodes. Reproductive: Unremarkable. Other: No free intraperitoneal fluid or air. Musculoskeletal: No acute fracture. Chronic compression fracture of L4 is unchanged. IMPRESSION: Right pyelonephritis. Electronically Signed   By: Minerva Fester M.D.   On: 05/22/2023 20:06   CT HEAD WO CONTRAST ( )  Result Date: 05/22/2023 CLINICAL DATA:  Weakness with multiple falls, confusion, urinary incontinence EXAM: CT HEAD WITHOUT CONTRAST TECHNIQUE: Contiguous axial images were obtained from the base of the skull through the vertex without intravenous contrast. RADIATION DOSE REDUCTION: This exam was performed according to the departmental dose-optimization program which includes automated exposure control, adjustment of the mA and/or kV according to patient size and/or use of iterative reconstruction technique. COMPARISON:  02/19/2023 FINDINGS: Brain: No evidence of acute infarction, hemorrhage, mass, mass effect, or midline shift. No hydrocephalus or extra-axial fluid collection. Normal cerebral volume for age. Vascular: No hyperdense vessel. Skull: Negative for fracture or focal lesion. Sinuses/Orbits: No acute finding. Other: The mastoid air cells are well aerated. IMPRESSION: No acute intracranial process. Electronically Signed   By: Wiliam Ke M.D.   On: 05/22/2023 19:27   DG Chest Port 1 View  Result Date: 05/22/2023 CLINICAL DATA:  Weakness and multiple falls. EXAM: PORTABLE CHEST 1 VIEW COMPARISON:  02/19/2023 FINDINGS: Stable cardiomediastinal silhouette. Aortic atherosclerotic calcification. No focal consolidation, pleural effusion, or pneumothorax. No displaced rib fractures. IMPRESSION: No acute cardiopulmonary disease. Electronically Signed   By: Minerva Fester M.D.   On: 05/22/2023 19:01    Scheduled Meds:    aspirin EC  81 mg Oral Daily   carbidopa-levodopa  2 tablet Oral 2 times per day   And    carbidopa-levodopa  1.5 tablet Oral Daily   feeding supplement  237 mL Oral BID BM   folic acid  1 mg Oral Daily   rOPINIRole  0.125 mg Oral QHS   thiamine  250 mg Oral Daily    Continuous Infusions:    sodium chloride     cefTRIAXone (ROCEPHIN)  IV Stopped (05/23/23 6213)   lactated ringers Stopped (05/23/23 0046)   lactated ringers 150 mL/hr (05/23/23 0045)     LOS: 1 day     Marcellus Scott, MD,  FACP, FHM, [facility], [facility], [facility]   [facility] [place]      To contact the attending provider between 7A-7P or the covering provider during after hours 7P-7A, please log into the web site [email or web address] and access using universal [place] password for that web site. If you do not have the password, please call the hospital operator.  05/23/2023, 8:44 AM

## 2023-05-23 NOTE — Assessment & Plan Note (Signed)
Allow permissive hypertension for tonight 

## 2023-05-23 NOTE — Assessment & Plan Note (Signed)
Continue home medications carbidopa levodopa at 25/100 as prescribed

## 2023-05-23 NOTE — ED Notes (Signed)
ED TO INPATIENT HANDOFF REPORT  ED Nurse Name and Phone #: Leeroy Bock [phone]  S Name/Age/Gender Hannah Fisher 72 y.o. female Room/Bed: 008C/008C  Code Status   Code Status: Full Code  Home/SNF/Other Home Patient oriented to: self Is this baseline? No   Triage Complete: Triage complete  Chief Complaint Sepsis [facility]) [A41.9] Pyelonephritis [N12]  Triage Note Per EMS, Pt, from [facility], c/o weakness w/ multiple falls over the past 24hrs, confusion, and urinary incontinence.  EMS reports Pt is normally A&Ox4.  Hx of Parkinson's.       Allergies Allergies  Allergen Reactions   Prednisone Shortness Of Breath, Swelling and Other (See Comments)    Tightness in chest, trouble breathing, edema, and chest pressure   Penicillins Hives, Swelling and Other (See Comments)    Edema and itching, no hospitalization or airway required    Beta Adrenergic Blockers Nausea Only and Other (See Comments)    Made the patient feel badly   Sulfa Antibiotics Rash    Level of Care/Admitting Diagnosis ED Disposition     ED Disposition  Admit   Condition  --   Comment  Hospital Area: [facility] [[id number]]  Level of Care: Med-Surg [16]  May admit patient to [facility] or [facility] if equivalent level of care is available:: No  Covid Evaluation: Asymptomatic - no recent exposure (last 10 days) testing not required  Diagnosis: Pyelonephritis [829562]  Admitting Physician: Therisa Doyne [[id number]]  Attending Physician: Marcellus Scott D [[id number]]  Certification:: I certify this patient will need inpatient services for at least 2 midnights  Expected Medical Readiness: 05/25/2023          B Medical/Surgery History Past Medical History:  Diagnosis Date   Arthritis    hands, knees   Deafness    left ear sue to shingles   Hypertension    Kidney stones    Osteoporosis 2009   -2.6   Shingles    Past Surgical History:  Procedure Laterality Date    MASS EXCISION Right 05/30/2018   Procedure: EXCISION MASS RIGHT SMALL FINGER;  Surgeon: Cindee Salt, MD;  Location: [facility];  Service: Orthopedics;  Laterality: Right;   TONSILLECTOMY     VAGINAL HYSTERECTOMY  1992     A IV Location/Drains/Wounds Patient Lines/Drains/Airways Status     Active Line/Drains/Airways     Name Placement date Placement time Site Days   Peripheral IV 05/22/23 20 G Left Forearm 05/22/23  1719  Forearm  1            Intake/Output Last 24 hours  Intake/Output Summary (Last 24 hours) at 05/23/2023 1308 Last data filed at 05/23/2023 0046 Gross per 24 hour  Intake 2299.55 ml  Output --  Net 2299.55 ml    Labs/Imaging Results for orders placed or performed during the hospital encounter of 05/22/23 (from the past 48 hour(s))  Blood Culture (routine x 2)     Status: None (Preliminary result)   Collection Time: 05/22/23  5:34 PM   Specimen: BLOOD  Result Value Ref Range   Specimen Description BLOOD RIGHT ANTECUBITAL    Special Requests      BOTTLES DRAWN AEROBIC AND ANAEROBIC Blood Culture adequate volume   Culture  Setup Time      GRAM NEGATIVE RODS ANAEROBIC BOTTLE ONLY Organism ID to follow Performed at [facility], [address] [place], [place] [address]    Culture GRAM NEGATIVE RODS  Report Status PENDING   Comprehensive metabolic panel     Status: Abnormal   Collection Time: 05/22/23  6:01 PM  Result Value Ref Range   Sodium 137 135 - 145 mmol/L   Potassium 3.9 3.5 - 5.1 mmol/L   Chloride 102 98 - 111 mmol/L   CO2 21 (L) 22 - 32 mmol/L   Glucose, Bld 109 (H) 70 - 99 mg/dL    Comment: Glucose reference range applies only to samples taken after fasting for at least 8 hours.   BUN 37 (H) 8 - 23 mg/dL   Creatinine, Ser 1.61 (H) 0.44 - 1.00 mg/dL   Calcium 9.5 8.9 - 09.6 mg/dL   Total Protein 7.4 6.5 - 8.1 g/dL   Albumin 3.8 3.5 - 5.0 g/dL   AST 23 15 - 41 U/L   ALT <5 0 - 44 U/L   Alkaline Phosphatase 106  38 - 126 U/L   Total Bilirubin 0.9 0.3 - 1.2 mg/dL   GFR, Estimated 59 (L) >60 mL/min    Comment: (NOTE) Calculated using the CKD-EPI Creatinine Equation (2021)    Anion gap 14 5 - 15    Comment: Performed at [facility], [address] [place], [place] [address]  CBC with Differential     Status: Abnormal   Collection Time: 05/22/23  6:01 PM  Result Value Ref Range   WBC 9.8 4.0 - 10.5 K/uL   RBC 4.34 3.87 - 5.11 MIL/uL   Hemoglobin 13.1 12.0 - 15.0 g/dL   HCT 98.1 19.1 - 47.8 %   MCV 93.5 80.0 - 100.0 fL   MCH 30.2 26.0 - 34.0 pg   MCHC 32.3 30.0 - 36.0 g/dL   RDW 29.5 62.1 - 30.8 %   Platelets 207 150 - 400 K/uL   nRBC 0.0 0.0 - 0.2 %   Neutrophils Relative % 89 %   Neutro Abs 8.8 (H) 1.7 - 7.7 K/uL   Lymphocytes Relative 5 %   Lymphs Abs 0.5 (L) 0.7 - 4.0 K/uL   Monocytes Relative 6 %   Monocytes Absolute 0.5 0.1 - 1.0 K/uL   Eosinophils Relative 0 %   Eosinophils Absolute 0.0 0.0 - 0.5 K/uL   Basophils Relative 0 %   Basophils Absolute 0.0 0.0 - 0.1 K/uL   Immature Granulocytes 0 %   Abs Immature Granulocytes 0.04 0.00 - 0.07 K/uL    Comment: Performed at [facility], [address], [place], [place] [address]  Protime-INR     Status: None   Collection Time: 05/22/23  6:01 PM  Result Value Ref Range   Prothrombin Time 13.2 11.4 - 15.2 seconds   INR 1.0 0.8 - 1.2    Comment: (NOTE) INR goal varies based on device and disease states. Performed at [facility], [address], [place], [place] [address]   APTT     Status: None   Collection Time: 05/22/23  6:01 PM  Result Value Ref Range   aPTT 29 24 - 36 seconds    Comment: Performed at [facility], [address] [place], [place] [address]  I-Stat Lactic Acid, ED     Status: None   Collection Time: 05/22/23  6:12 PM  Result Value Ref Range   Lactic Acid, Venous 1.1 0.5 - 1.9 mmol/L  Resp panel by RT-PCR (RSV, Flu A&B, Covid) Anterior Nasal Swab     Status: None   Collection Time:  05/22/23  6:54 PM  Specimen: Anterior Nasal Swab  Result Value Ref Range   SARS Coronavirus 2 by RT PCR NEGATIVE NEGATIVE   Influenza A by PCR NEGATIVE NEGATIVE   Influenza B by PCR NEGATIVE NEGATIVE    Comment: (NOTE) The Xpert Xpress SARS-CoV-2/FLU/RSV plus assay is intended as an aid in the diagnosis of influenza from Nasopharyngeal swab specimens and should not be used as a sole basis for treatment. Nasal washings and aspirates are unacceptable for Xpert Xpress SARS-CoV-2/FLU/RSV testing.  Fact Sheet for Patients: BloggerCourse.com  Fact Sheet for Healthcare Providers: SeriousBroker.it  This test is not yet approved or cleared by the [place] FDA and has been authorized for detection and/or diagnosis of SARS-CoV-2 by FDA under an Emergency Use Authorization (EUA). This EUA will remain in effect (meaning this test can be used) for the duration of the COVID-19 declaration under Section 564(b)(1) of the Act, 21 U.S.C. section 360bbb-3(b)(1), unless the authorization is terminated or revoked.     Resp Syncytial Virus by PCR NEGATIVE NEGATIVE    Comment: (NOTE) Fact Sheet for Patients: BloggerCourse.com  Fact Sheet for Healthcare Providers: SeriousBroker.it  This test is not yet approved or cleared by the [place] FDA and has been authorized for detection and/or diagnosis of SARS-CoV-2 by FDA under an Emergency Use Authorization (EUA). This EUA will remain in effect (meaning this test can be used) for the duration of the COVID-19 declaration under Section 564(b)(1) of the Act, 21 U.S.C. section 360bbb-3(b)(1), unless the authorization is terminated or revoked.  Performed at [facility], [address], [place], [place] [address]   Blood Culture (routine x 2)     Status: None (Preliminary result)   Collection Time: 05/22/23  8:34 PM   Specimen: BLOOD   Result Value Ref Range   Specimen Description BLOOD RIGHT ANTECUBITAL    Special Requests      BOTTLES DRAWN AEROBIC AND ANAEROBIC Blood Culture adequate volume   Culture      NO GROWTH < 12 HOURS Performed at [facility], [address] [place], [place] [address]    Report Status PENDING   Prealbumin     Status: Abnormal   Collection Time: 05/23/23  3:05 AM  Result Value Ref Range   Prealbumin 9 (L) 18 - 38 mg/dL    Comment: Performed at [facility], [address] [place], [place] [address]  Magnesium     Status: None   Collection Time: 05/23/23  3:05 AM  Result Value Ref Range   Magnesium 2.2 1.7 - 2.4 mg/dL    Comment: Performed at [facility], [address], [place], [place] [address]  Phosphorus     Status: Abnormal   Collection Time: 05/23/23  3:05 AM  Result Value Ref Range   Phosphorus 2.2 (L) 2.5 - 4.6 mg/dL    Comment: Performed at [facility], [address], [place], [place] [address]  Comprehensive metabolic panel     Status: Abnormal   Collection Time: 05/23/23  3:05 AM  Result Value Ref Range   Sodium 138 135 - 145 mmol/L   Potassium 3.7 3.5 - 5.1 mmol/L   Chloride 106 98 - 111 mmol/L   CO2 20 (L) 22 - 32 mmol/L   Glucose, Bld 101 (H) 70 - 99 mg/dL    Comment: Glucose reference range applies only to samples taken after fasting for at least 8 hours.   BUN 25 (H) 8 - 23 mg/dL  Creatinine, Ser 0.98 0.44 - 1.00 mg/dL   Calcium 8.6 (L) 8.9 - 10.3 mg/dL   Total Protein 5.6 (L) 6.5 - 8.1 g/dL   Albumin 2.9 (L) 3.5 - 5.0 g/dL   AST 20 15 - 41 U/L   ALT 9 0 - 44 U/L   Alkaline Phosphatase 74 38 - 126 U/L   Total Bilirubin 0.6 0.3 - 1.2 mg/dL   GFR, Estimated >11 >91 mL/min    Comment: (NOTE) Calculated using the CKD-EPI Creatinine Equation (2021)    Anion gap 12 5 - 15    Comment: Performed at [facility], [address], [place], [place] [address]  CK     Status: Abnormal   Collection Time: 05/23/23  3:05 AM  Result  Value Ref Range   Total CK 487 (H) 38 - 234 U/L    Comment: Performed at [facility], [address], [place], [place] [address]  Procalcitonin     Status: None   Collection Time: 05/23/23  3:05 AM  Result Value Ref Range   Procalcitonin 3.88 ng/mL    Comment:        Interpretation: PCT > 2 ng/mL: Systemic infection (sepsis) is likely, unless other causes are known. (NOTE)       Sepsis PCT Algorithm           Lower Respiratory Tract                                      Infection PCT Algorithm    ----------------------------     ----------------------------         PCT < 0.25 ng/mL                PCT < 0.10 ng/mL          Strongly encourage             Strongly discourage   discontinuation of antibiotics    initiation of antibiotics    ----------------------------     -----------------------------       PCT 0.25 - 0.50 ng/mL            PCT 0.10 - 0.25 ng/mL               OR       >80% decrease in PCT            Discourage initiation of                                            antibiotics      Encourage discontinuation           of antibiotics    ----------------------------     -----------------------------         PCT >= 0.50 ng/mL              PCT 0.26 - 0.50 ng/mL               AND       <80% decrease in PCT              Encourage initiation of  antibiotics       Encourage continuation           of antibiotics    ----------------------------     -----------------------------        PCT >= 0.50 ng/mL                  PCT > 0.50 ng/mL               AND         increase in PCT                  Strongly encourage                                      initiation of antibiotics    Strongly encourage escalation           of antibiotics                                     -----------------------------                                           PCT <= 0.25 ng/mL                                                 OR                                         > 80% decrease in PCT                                      Discontinue / Do not initiate                                             antibiotics  Performed at [facility], [address], [place], [place] [address]   CBC with Differential     Status: Abnormal   Collection Time: 05/23/23  3:07 AM  Result Value Ref Range   WBC 7.4 4.0 - 10.5 K/uL   RBC 3.70 (L) 3.87 - 5.11 MIL/uL   Hemoglobin 11.4 (L) 12.0 - 15.0 g/dL   HCT 95.6 (L) 21.3 - 08.6 %   MCV 95.7 80.0 - 100.0 fL   MCH 30.8 26.0 - 34.0 pg   MCHC 32.2 30.0 - 36.0 g/dL   RDW 57.8 46.9 - 62.9 %   Platelets 179 150 - 400 K/uL   nRBC 0.0 0.0 - 0.2 %   Neutrophils Relative % 86 %   Neutro Abs 6.3 1.7 - 7.7 K/uL   Lymphocytes Relative 7 %   Lymphs Abs 0.5 (L) 0.7 - 4.0 K/uL   Monocytes Relative 7 %   Monocytes Absolute 0.5 0.1 - 1.0 K/uL   Eosinophils  Relative 0 %   Eosinophils Absolute 0.0 0.0 - 0.5 K/uL   Basophils Relative 0 %   Basophils Absolute 0.0 0.0 - 0.1 K/uL   Immature Granulocytes 0 %   Abs Immature Granulocytes 0.03 0.00 - 0.07 K/uL    Comment: Performed at [facility], [address] [place], [place] [address]  Osmolality     Status: Abnormal   Collection Time: 05/23/23  3:07 AM  Result Value Ref Range   Osmolality 300 (H) 275 - 295 mOsm/kg    Comment: Performed at [facility], [address], [place], [place] [address]  TSH     Status: None   Collection Time: 05/23/23  3:07 AM  Result Value Ref Range   TSH 0.774 0.350 - 4.500 uIU/mL    Comment: Performed by a 3rd Generation assay with a functional sensitivity of <=0.01 uIU/mL. Performed at [facility], [address], [place], [place] [address]   Vitamin B12     Status: Abnormal   Collection Time: 05/23/23  3:07 AM  Result Value Ref Range   Vitamin B-12 2,274 (H) 180 - 914 pg/mL    Comment: (NOTE) This assay is not validated for testing neonatal or myeloproliferative syndrome specimens for Vitamin B12  levels. Performed at [facility], [address] [place], [place] [address]   Folate     Status: None   Collection Time: 05/23/23  3:07 AM  Result Value Ref Range   Folate 25.3 >5.9 ng/mL    Comment: RESULT CONFIRMED BY MANUAL DILUTION Performed at [facility], [address], [place], [place] [address]   Iron and TIBC     Status: Abnormal   Collection Time: 05/23/23  3:07 AM  Result Value Ref Range   Iron 8 (L) 28 - 170 ug/dL   TIBC 932 (L) 355 - 732 ug/dL   Saturation Ratios 3 (L) 10.4 - 31.8 %   UIBC 224 ug/dL    Comment: Performed at [facility], [address], [place], [place] [address]  Ferritin     Status: None   Collection Time: 05/23/23  3:07 AM  Result Value Ref Range   Ferritin 125 11 - 307 ng/mL    Comment: Performed at [facility], [address], [place], [place] [address]  Reticulocytes     Status: Abnormal   Collection Time: 05/23/23  3:07 AM  Result Value Ref Range   Retic Ct Pct 0.9 0.4 - 3.1 %   RBC. 3.66 (L) 3.87 - 5.11 MIL/uL   Retic Count, Absolute 34.0 19.0 - 186.0 K/uL   Immature Retic Fract 14.9 2.3 - 15.9 %    Comment: Performed at [facility], [address] [place], [place] [address]   CT ABDOMEN PELVIS W CONTRAST  Result Date: 05/22/2023 CLINICAL DATA:  Sepsis, abdominal pain, acute, non localized EXAM: CT ABDOMEN AND PELVIS WITH CONTRAST TECHNIQUE: Multidetector CT imaging of the abdomen and pelvis was performed using the standard protocol following bolus administration of intravenous contrast. RADIATION DOSE REDUCTION: This exam was performed according to the departmental dose-optimization program which includes automated exposure control, adjustment of the mA and/or kV according to patient size and/or use of iterative reconstruction technique. CONTRAST:  75mL OMNIPAQUE IOHEXOL 350 MG/ML SOLN COMPARISON:  CT abdomen and pelvis 09/29/2021 FINDINGS: Lower chest: No acute abnormality. Hepatobiliary: Unremarkable  liver. Normal gallbladder. No biliary dilation. Pancreas: Unremarkable. Spleen: Unremarkable. Adrenals/Urinary Tract: Normal  adrenal glands. Geographic cortical hypoattenuation within the right kidney compatible with pyelonephritis. No urinary calculi or hydronephrosis. Bladder is unremarkable. Stomach/Bowel: Normal caliber large and small bowel. No bowel wall thickening. The appendix is normal.Stomach is within normal limits. Vascular/Lymphatic: No significant vascular findings are present. No enlarged abdominal or pelvic lymph nodes. Reproductive: Unremarkable. Other: No free intraperitoneal fluid or air. Musculoskeletal: No acute fracture. Chronic compression fracture of L4 is unchanged. IMPRESSION: Right pyelonephritis. Electronically Signed   By: Minerva Fester M.D.   On: 05/22/2023 20:06   CT HEAD WO CONTRAST ( )  Result Date: 05/22/2023 CLINICAL DATA:  Weakness with multiple falls, confusion, urinary incontinence EXAM: CT HEAD WITHOUT CONTRAST TECHNIQUE: Contiguous axial images were obtained from the base of the skull through the vertex without intravenous contrast. RADIATION DOSE REDUCTION: This exam was performed according to the departmental dose-optimization program which includes automated exposure control, adjustment of the mA and/or kV according to patient size and/or use of iterative reconstruction technique. COMPARISON:  02/19/2023 FINDINGS: Brain: No evidence of acute infarction, hemorrhage, mass, mass effect, or midline shift. No hydrocephalus or extra-axial fluid collection. Normal cerebral volume for age. Vascular: No hyperdense vessel. Skull: Negative for fracture or focal lesion. Sinuses/Orbits: No acute finding. Other: The mastoid air cells are well aerated. IMPRESSION: No acute intracranial process. Electronically Signed   By: Wiliam Ke M.D.   On: 05/22/2023 19:27   DG Chest Port 1 View  Result Date: 05/22/2023 CLINICAL DATA:  Weakness and multiple falls. EXAM: PORTABLE CHEST 1  VIEW COMPARISON:  02/19/2023 FINDINGS: Stable cardiomediastinal silhouette. Aortic atherosclerotic calcification. No focal consolidation, pleural effusion, or pneumothorax. No displaced rib fractures. IMPRESSION: No acute cardiopulmonary disease. Electronically Signed   By: Minerva Fester M.D.   On: 05/22/2023 19:01    Pending Labs Unresulted Labs (From admission, onward)     Start     Ordered   05/23/23 0513  Urinalysis, w/ Reflex to Culture (Infection Suspected) -Urine, Clean Catch  (Septic presentation on arrival (screening labs, nursing and treatment orders for obvious sepsis))  ONCE - URGENT,   URGENT       Question:  Specimen Source  Answer:  Urine, Clean Catch   05/23/23 0512   05/22/23 2212  Osmolality, urine  Once,   URGENT        05/22/23 2211   05/22/23 2212  Creatinine, urine, random  Once,   URGENT        05/22/23 2211   05/22/23 2212  Sodium, urine, random  Once,   URGENT        05/22/23 2211   05/22/23 2211  Culture, blood (x 2)  BLOOD CULTURE X 2,   R (with STAT occurrences)     Comments: INITIATE ANTIBIOTICS WITHIN 1 HOUR AFTER BLOOD CULTURES DRAWN.  If unable to obtain blood cultures, call MD immediately regarding antibiotic instructions.    05/22/23 2211   05/22/23 1734  Blood Culture ID Panel (Reflexed)  Once,   STAT        05/22/23 1734            Vitals/Pain Today's Vitals   05/23/23 0705 05/23/23 0710 05/23/23 0715 05/23/23 0730  BP:   131/83 124/70  Pulse: 83 81 84 98  Resp: 17 17 16 17   Temp:      TempSrc:      SpO2: 100% 99% 100% 99%    Isolation Precautions No active isolations  Medications Medications  lactated ringers infusion (0 mLs Intravenous Stopped 05/23/23 0046)  lactated ringers infusion (150 mL/hr Intravenous New Bag/Given 05/23/23 0045)  cefTRIAXone (ROCEPHIN) 2 g in sodium chloride 0.9 % 100 mL IVPB (2 g Intravenous New Bag/Given 05/23/23 0731)  aspirin EC tablet 81 mg (has no administration in time range)  ALPRAZolam (XANAX) tablet  0.25 mg (has no administration in time range)  feeding supplement (ENSURE ENLIVE / ENSURE PLUS) liquid 237 mL (has no administration in time range)  folic acid (FOLVITE) tablet 1 mg (has no administration in time range)  rOPINIRole (REQUIP) tablet 0.125 mg (0.125 mg Oral Given 05/23/23 0033)  thiamine (VITAMIN B1) tablet 250 mg (has no administration in time range)  0.9 %  sodium chloride infusion ( Intravenous Not Given 05/23/23 0200)  acetaminophen (TYLENOL) tablet 650 mg (has no administration in time range)    Or  acetaminophen (TYLENOL) suppository 650 mg (has no administration in time range)  HYDROcodone-acetaminophen (NORCO/VICODIN) 5-325 MG per tablet 1-2 tablet (has no administration in time range)  ondansetron (ZOFRAN) tablet 4 mg (has no administration in time range)    Or  ondansetron (ZOFRAN) injection 4 mg (has no administration in time range)  carbidopa-levodopa (SINEMET IR) 25-100 MG per tablet immediate release 2 tablet (has no administration in time range)    And  carbidopa-levodopa (SINEMET IR) 25-100 MG per tablet immediate release 1.5 tablet (has no administration in time range)  lactated ringers bolus 1,000 mL (0 mLs Intravenous Stopped 05/22/23 1923)  metroNIDAZOLE (FLAGYL) IVPB 500 mg (0 mg Intravenous Stopped 05/22/23 1923)  vancomycin (VANCOCIN) IVPB 1000 mg/200 mL premix (0 mg Intravenous Stopped 05/22/23 2136)  ceFEPIme (MAXIPIME) 2 g in sodium chloride 0.9 % 100 mL IVPB (0 g Intravenous Stopped 05/22/23 2045)  acetaminophen (TYLENOL) tablet 1,000 mg (1,000 mg Oral Given 05/22/23 2030)  iohexol (OMNIPAQUE) 350 MG/ML injection 75 mL (75 mLs Intravenous Contrast Given 05/22/23 1947)    Mobility walks with person assist     Focused Assessments   R Recommendations: See Admitting Provider Note  Report given to:   Additional Notes:

## 2023-05-23 NOTE — Assessment & Plan Note (Signed)
-  SIRS criteria met with      Component Value Date/Time   WBC 9.8 05/22/2023 1801   LYMPHSABS 0.5 (L) 05/22/2023 1801   LYMPHSABS 1.6 08/23/2021 1459     tachycardia   fever   RR >20 Today's Vitals   05/22/23 2230 05/22/23 2245 05/22/23 2300 05/22/23 2347  BP: 105/74 112/68 115/78 120/79  Pulse: 96 (!) 102 95 97  Resp: 16 (!) 23 16   Temp:    98.7 F (37.1 C)  TempSrc:    Oral  SpO2: 98% 96% 97% 99%    The recent clinical data is shown below. Vitals:   05/22/23 2230 05/22/23 2245 05/22/23 2300 05/22/23 2347  BP: 105/74 112/68 115/78 120/79  Pulse: 96 (!) 102 95 97  Resp: 16 (!) 23 16   Temp:    98.7 F (37.1 C)  TempSrc:    Oral  SpO2: 98% 96% 97% 99%      -Most likely source being:  urinary,    Patient meeting criteria for Severe sepsis with  acute encephalopahty     - Obtain serial lactic acid and procalcitonin level.  - Initiated IV antibiotics in ER: Antibiotics Given (last 72 hours)     Date/Time Action Medication Dose Rate   05/22/23 1808 New Bag/Given   metroNIDAZOLE (FLAGYL) IVPB 500 mg 500 mg 100 mL/hr   05/22/23 1904 New Bag/Given   ceFEPIme (MAXIPIME) 2 g in sodium chloride 0.9 % 100 mL IVPB 2 g 200 mL/hr   05/22/23 2033 New Bag/Given   vancomycin (VANCOCIN) IVPB 1000 mg/200 mL premix 1,000 mg 200 mL/hr       Will continue  on : rocephin   - await results of blood and urine culture  - Rehydrate aggressively  Intravenous fluids were administered,      12:13 AM

## 2023-05-24 DIAGNOSIS — E876 Hypokalemia: Secondary | ICD-10-CM | POA: Diagnosis not present

## 2023-05-24 DIAGNOSIS — A419 Sepsis, unspecified organism: Secondary | ICD-10-CM | POA: Diagnosis not present

## 2023-05-24 DIAGNOSIS — N12 Tubulo-interstitial nephritis, not specified as acute or chronic: Secondary | ICD-10-CM | POA: Diagnosis not present

## 2023-05-24 LAB — PHOSPHORUS
Phosphorus: 1.6 mg/dL — ABNORMAL LOW (ref 2.5–4.6)
Phosphorus: 2.8 mg/dL (ref 2.5–4.6)

## 2023-05-24 LAB — URINALYSIS, W/ REFLEX TO CULTURE (INFECTION SUSPECTED)
Bilirubin Urine: NEGATIVE
Glucose, UA: 50 mg/dL — AB
Ketones, ur: 5 mg/dL — AB
Nitrite: NEGATIVE
Protein, ur: NEGATIVE mg/dL
Specific Gravity, Urine: 1.01 (ref 1.005–1.030)
pH: 7 (ref 5.0–8.0)

## 2023-05-24 LAB — BASIC METABOLIC PANEL
Anion gap: 8 (ref 5–15)
BUN: 14 mg/dL (ref 8–23)
CO2: 20 mmol/L — ABNORMAL LOW (ref 22–32)
Calcium: 8.2 mg/dL — ABNORMAL LOW (ref 8.9–10.3)
Chloride: 108 mmol/L (ref 98–111)
Creatinine, Ser: 0.76 mg/dL (ref 0.44–1.00)
GFR, Estimated: >60 mL/min (ref >60)
Glucose, Bld: 106 mg/dL — ABNORMAL HIGH (ref 70–99)
Potassium: 3.2 mmol/L — ABNORMAL LOW (ref 3.5–5.1)
Sodium: 136 mmol/L (ref 135–145)

## 2023-05-24 LAB — CBC
HCT: 34.3 % — ABNORMAL LOW (ref 36.0–46.0)
Hemoglobin: 11.1 g/dL — ABNORMAL LOW (ref 12.0–15.0)
MCH: 30.1 pg (ref 26.0–34.0)
MCHC: 32.4 g/dL (ref 30.0–36.0)
MCV: 93 fL (ref 80.0–100.0)
Platelets: 169 10*3/uL (ref 150–400)
RBC: 3.69 MIL/uL — ABNORMAL LOW (ref 3.87–5.11)
RDW: 12.4 % (ref 11.5–15.5)
WBC: 5.3 10*3/uL (ref 4.0–10.5)
nRBC: 0 % (ref 0.0–0.2)

## 2023-05-24 MED ORDER — ADULT MULTIVITAMIN W/MINERALS CH
1.0000 | ORAL_TABLET | Freq: Every day | ORAL | Status: DC
  Administered 2023-05-24 – 2023-05-31 (×8): 1 via ORAL
  Filled 2023-05-24 (×8): qty 1

## 2023-05-24 MED ORDER — POTASSIUM PHOSPHATES 15 MMOLE/5ML IV SOLN
30.0000 mmol | Freq: Once | INTRAVENOUS | Status: AC
  Administered 2023-05-24: 30 mmol via INTRAVENOUS
  Filled 2023-05-24: qty 10

## 2023-05-24 MED ORDER — POTASSIUM CHLORIDE CRYS ER 20 MEQ PO TBCR
40.0000 meq | EXTENDED_RELEASE_TABLET | Freq: Once | ORAL | Status: AC
  Administered 2023-05-24: 40 meq via ORAL
  Filled 2023-05-24: qty 2

## 2023-05-24 MED ORDER — K PHOS MONO-SOD PHOS DI & MONO 155-852-130 MG PO TABS
500.0000 mg | ORAL_TABLET | Freq: Three times a day (TID) | ORAL | Status: DC
  Filled 2023-05-24: qty 2

## 2023-05-24 NOTE — Plan of Care (Signed)

## 2023-05-24 NOTE — Progress Notes (Signed)
Initial Nutrition Assessment  DOCUMENTATION CODES:   Underweight  INTERVENTION:  - Continue Ensure Enlive po BID, each supplement provides 350 kcal and 20 grams of protein.  - Add MVI q day.   - Liberalize to a Regular diet.   NUTRITION DIAGNOSIS:   Inadequate oral intake related to poor appetite, lethargy/confusion as evidenced by meal completion < 50%.  GOAL:   Patient will meet greater than or equal to 90% of their needs  MONITOR:   PO intake, Supplement acceptance  REASON FOR ASSESSMENT:   Consult Assessment of nutrition requirement/status  ASSESSMENT:   72 y.o. female admits related to weakness and AMS. PMH includes: Parkinson, deafness, HTN, cognitive dysfunction. Pt is currently receiving medical management related to suspected E.Coli bacteremia and sepsis from right-sided pyelonephritis.  Meds reviewed: calcium carbonate, folic acid, mag-ox, remeron, miralax, thiamine. Labs reviewed: K low, phos low.   Pt was being seen by another provider at time of assessment. Pt is currently oriented x1. Per record, pt ate 25% of her dinner last night. No significant wt loss per record. Per meds hx record, pt is accepting Ensure supplements. Pt is currently on a heart healthy diet, RD will liberalize to a Regular diet. Will continue to monitor PO intakes. Pt is planning to discharge to SNF.   NUTRITION - FOCUSED PHYSICAL EXAM:  Attempt at f/u.  Diet Order:   Diet Order             Diet regular Room service appropriate? Yes with Assist; Fluid consistency: Thin  Diet effective now                   EDUCATION NEEDS:   Not appropriate for education at this time  Skin:  Skin Assessment: Reviewed RN Assessment  Last BM:  8/22 - type 1  Height:   Ht Readings from Last 1 Encounters:  02/19/23 5\' 1"  (1.549 m)    Weight:   Wt Readings from Last 1 Encounters:  05/23/23 41.5 kg    Ideal Body Weight:     BMI:  Body mass index is 17.29 kg/m.  Estimated  Nutritional Needs:   Kcal:  1300-1600 kcals  Protein:  65-80 gm  Fluid:  >/= 1.3 L  Bethann Humble, RD, LDN, CNSC.

## 2023-05-24 NOTE — TOC Initial Note (Signed)
Transition of Care [facility]) - Initial/Assessment Note    Patient Details  Name: Hannah Fisher MRN: [id number] Date of Birth: [birth year]  Transition of Care [facility]) CM/SW Contact:    Farrel Guimond A [place], Theresia Majors Phone Number: 05/24/2023, 4:46 PM  Clinical Narrative:                  CSW met with pt at bedside but pt too disoriented for conversation. CSW followed up with pt's daughter [name] regarding SNF  placement. She said pt is at [facility], [facility], but is not sure she can return. She said pt has been to SNF a few times in the past and was ok with option. She said pt has been to [facility] before and is their preference.   CSW completed SNF workup.   She requested information for DSS social services for applying for Medicaid for pt. CSW will follow up.  TOC will continue to follow.  Expected Discharge Plan: Skilled Nursing Facility Barriers to Discharge: Continued Medical Work up, English as a second language teacher, SNF Pending bed offer   Patient Goals and CMS Choice Patient states their goals for this hospitalization and ongoing recovery are:: Get stronger          Expected Discharge Plan and Services In-house Referral: Clinical Social Work     Living arrangements for the past 2 months: Independent Living Facility Armed forces logistics/support/administrative officer)                                      Prior Living Arrangements/Services Living arrangements for the past 2 months: Marketing executive ([facility]) Lives with:: Self          Need for Family Participation in Patient Care: Yes (Comment) Care giver support system in place?: Yes (comment)   Criminal Activity/Legal Involvement Pertinent to Current Situation/Hospitalization: No - Comment as needed  Activities of Daily Living Home Assistive Devices/Equipment: Environmental consultant (specify type), Wheelchair, Shower chair with back ADL Screening (condition at time of admission) Patient's cognitive ability adequate to safely complete  daily activities?: No Is the patient deaf or have difficulty hearing?: Yes Does the patient have difficulty seeing, even when wearing glasses/contacts?: No Does the patient have difficulty concentrating, remembering, or making decisions?: Yes Patient able to express need for assistance with ADLs?: Yes Does the patient have difficulty dressing or bathing?: No Independently performs ADLs?: No Communication: Independent Dressing (OT): Needs assistance Is this a change from baseline?: Pre-admission baseline Grooming: Needs assistance Is this a change from baseline?: Pre-admission baseline Feeding: Independent Bathing: Needs assistance Is this a change from baseline?: Pre-admission baseline Toileting: Needs assistance Is this a change from baseline?: Pre-admission baseline In/Out Bed: Needs assistance Is this a change from baseline?: Pre-admission baseline Walks in Home: Independent with device (comment) Does the patient have difficulty walking or climbing stairs?: No Weakness of Legs: None Weakness of Arms/Hands: None  Permission Sought/Granted                  Emotional Assessment Appearance:: Appears older than stated age Attitude/Demeanor/Rapport:  (Normal) Affect (typically observed): Agitated Orientation: : Oriented to Self Alcohol / Substance Use: Not Applicable Psych Involvement: No (comment)  Admission diagnosis:  Pyelonephritis [N12] Sepsis (HCC) [A41.9] Sepsis, due to unspecified organism, unspecified whether acute organ dysfunction present [facility]) [A41.9] Patient Active Problem List   Diagnosis Date Noted   Pyelonephritis 05/23/2023   Sepsis (HCC) 05/22/2023   Hypokalemia  02/19/2023   HTN (hypertension) 02/19/2023   Weakness 02/19/2023   Impairment of balance 08/23/2021   Parkinson disease 01/28/2021   Abnormal gait 01/24/2021   Allergic rhinitis 01/24/2021   Allergic rhinitis due to animal (cat) (dog) hair and dander 01/24/2021   Anxiety 01/24/2021    Chronic constipation 01/24/2021   Difficulty sleeping 01/24/2021   Gastritis 01/24/2021   High cholesterol 01/24/2021   Protein calorie malnutrition (HCC) 01/24/2021   Rectal bleeding 01/24/2021   Recurrent falls 01/24/2021   Restless legs 01/24/2021   Sciatica 01/24/2021   Standard chest x-ray abnormal 01/24/2021   Vitamin D deficiency 01/24/2021   Acute encephalopathy 12/28/2020   Rhabdomyolysis 12/28/2020   Lightheadedness 06/22/2020   Other abnormalities of gait and mobility 05/03/2020   Lumbar radiculopathy 04/28/2020   Dysphonia 12/03/2019   Age-related osteoporosis without current pathological fracture 11/25/2019   Dysfunction of both eustachian tubes 05/19/2019   Presbycusis of both ears 05/19/2019   Unilateral deafness, left 04/07/2019   Mass 04/17/2018   Deafness    Shingles    Osteoporosis    Hypertension    PCP:  Daisy Floro, MD Pharmacy:   [facility] [id number] - [place], [place] - [address] [place] [address] Phone: [phone] Fax: [phone]  [facility] [place], [place] - [address] [facility] [address] [place] [address] Phone: [phone] Fax: [phone]     Social Determinants of Health (SDOH) Social History: SDOH Screenings   Food Insecurity: No Food Insecurity (05/23/2023)  Housing: Low Risk  (05/23/2023)  Transportation Needs: No Transportation Needs (05/23/2023)  Utilities: Not At Risk (05/23/2023)  Tobacco Use: Low Risk  (05/22/2023)   SDOH Interventions:     Readmission Risk Interventions     No data to display

## 2023-05-24 NOTE — Progress Notes (Signed)
Pharmacy was consulted for phos replacement for level of 1.6. We will use Kphos x1. Check phos post infusion.   Ulyses Southward, PharmD, BCIDP, AAHIVP, CPP Infectious Disease Pharmacist 05/24/2023 8:34 AM

## 2023-05-24 NOTE — Progress Notes (Addendum)
PROGRESS NOTE   Hannah Fisher  HQI:[id number]    DOB: 1951-10-01    DOA: 05/22/2023  PCP: [place] Floro, MD   I have briefly reviewed patients previous medical records in [facility].  Chief Complaint  Patient presents with   Weakness   Altered Mental Status    Brief Narrative:  72 year old female, resident of [facility], PMH of Parkinson's disease, deafness, cognitive impairment, HTN, presented to the ED on 05/22/2023 with complaints of fever, fatigue, falls, and increased confusion.  Reportedly had a similar admission in May 2024.  Admitted for sepsis due to E. coli bacteremia from right acute pyelonephritis and acute on chronic metabolic encephalopathy.   Assessment & Plan:  Principal Problem:   Sepsis (HCC) Active Problems:   Hypertension   Acute encephalopathy   Parkinson disease   Pyelonephritis   Suspected due to E. coli bacteremia, sepsis from right-sided pyelonephritis: Patient is a poor historian and cannot get history of UTI symptoms from her. Met sepsis criteria on admission: Temperature of 100.8, transient tachypnea of 22/min, persistent tachycardia for a couple of hours with heart rates up to 119/min, confirmed source based on CT abdomen with pyelonephritis and treating with IV antibiotics.  Had target organ dysfunction as evidenced by acute on chronic AMS. Despite reordering urine microscopy has urgent/stat yesterday, urine microscopy/culture has not been sent yet.  Even if send for now, may not be very helpful after doses of IV antibiotics. Blood cultures 1/4 confirms E. coli, sensitivities still pending.  Lactate normal.  Procalcitonin elevated at 3.88. CT abdomen showed right pyelonephritis. Continue IV ceftriaxone, day 3.  Pending sensitivities if pansensitive, can be switched over to oral antibiotics at discharge to complete course.  Acute septic encephalopathy complicating underlying chronic encephalopathy: CT head without acute  findings Likely secondary to pyelonephritis and sepsis. Treatment as above and monitor. Delirium precautions. As per discussion with daughter on 8/22, follows with neurology at [facility].  She has been diagnosed with dementia.  As per daughter, she has at least moderate dementia and current mental status is clearly worse than her baseline. Multifactorial AMS due to infection, hospitalization, complicating underlying dementia. Continue her scheduled Xanax (in the past when this has been changed to as needed, got more agitated per daughter).  Normocytic anemia: Unclear why anemia panel was requested.  Folate 25.3, ferritin 125, B12: 2274.  Stable Outpatient follow-up.  Falls: PT and OT evaluation. SNF recommended.  TOC consulted. As per daughter, previously had been at [facility].  Hypertension: Reasonably controlled.  Continue lisinopril 2.5 Mg daily  Parkinson's disease: Continue home Sinemet doses.  Hypokalemia: Replace and follow  Hypophosphatemia: Replace and follow  Body mass index is 17.29 kg/m.  ACP Documents: None present DVT prophylaxis: SCDs Start: 05/22/23 2338     Code Status: Full Code:  Family Communication: Daughter via phone. Disposition:  Status is: Inpatient Remains inpatient appropriate because: IV antibiotics.  DC to SNF pending culture sensitivity results, transition to oral antibiotics, SNF bed and insurance authorization.  This may take additional 24 to 48 hours     Consultants:     Procedures:     Antimicrobials:   IV ceftriaxone 8/20 >   Subjective:  Appears more confused today than she did yesterday morning.  Asking if my daughter was sitting next to her bed this morning (none there at this time).  Also telling me that she wants to leave so that she  has to meet her cousin.  Daughter confirms that her AMS is worse than her baseline.  Objective:   Vitals:   05/23/23 1609 05/23/23 1953  05/24/23 0448 05/24/23 0719  BP: 104/70 (!) 142/63 (!) 152/94 (!) 143/87  Pulse: 86 95 86 84  Resp: 18  18 18   Temp: (!) 97.5 F (36.4 C) 97.9 F (36.6 C) (!) 97.5 F (36.4 C) 97.9 F (36.6 C)  TempSrc:  Oral Oral Oral  SpO2: 100% (!) 89% 99% 100%  Weight:        General exam: Elderly female, moderately built and thinly nourished sitting up in bed, attempting to eat breakfast.  Pleasantly confused but not agitated.  Oral mucosa with borderline hydration. Respiratory system: Clear to auscultation. Respiratory effort normal. Cardiovascular system: S1 & S2 heard, RRR. No JVD, murmurs, rubs, gallops or clicks. No pedal edema.  No longer on telemetry. Gastrointestinal system: Abdomen is nondistended, soft and nontender. No organomegaly or masses felt. Normal bowel sounds heard.  No renal angle tenderness. Central nervous system: Alert and oriented to self and place. No focal neurological deficits. Extremities: Symmetric 5 x 5 power. Skin: No rashes, lesions or ulcers Psychiatry: Judgement and insight impaired. Mood & affect appropriate.     Data Reviewed:   I have personally reviewed following labs and imaging studies   CBC: Recent Labs  Lab 05/22/23 1801 05/23/23 0307 05/24/23 0005  WBC 9.8 7.4 5.3  NEUTROABS 8.8* 6.3  --   HGB 13.1 11.4* 11.1*  HCT 40.6 35.4* 34.3*  MCV 93.5 95.7 93.0  PLT 207 179 169    Basic Metabolic Panel: Recent Labs  Lab 05/22/23 1801 05/23/23 0305 05/24/23 0005  NA 137 138 136  K 3.9 3.7 3.2*  CL 102 106 108  CO2 21* 20* 20*  GLUCOSE 109* 101* 106*  BUN 37* 25* 14  CREATININE 1.01* 0.98 0.76  CALCIUM 9.5 8.6* 8.2*  MG  --  2.2  --   PHOS  --  2.2* 1.6*    Liver Function Tests: Recent Labs  Lab 05/22/23 1801 05/23/23 0305  AST 23 20  ALT <5 9  ALKPHOS 106 74  BILITOT 0.9 0.6  PROT 7.4 5.6*  ALBUMIN 3.8 2.9*    CBG: No results for input(s): "GLUCAP" in the last 168 hours.  Microbiology Studies:   Recent Results (from  the past 240 hour(s))  Blood Culture (routine x 2)     Status: Abnormal (Preliminary result)   Collection Time: 05/22/23  5:34 PM   Specimen: BLOOD  Result Value Ref Range Status   Specimen Description BLOOD RIGHT ANTECUBITAL  Final   Special Requests   Final    BOTTLES DRAWN AEROBIC AND ANAEROBIC Blood Culture adequate volume   Culture  Setup Time   Final    GRAM NEGATIVE RODS IN BOTH AEROBIC AND ANAEROBIC BOTTLES CRITICAL RESULT CALLED TO, READ BACK BY AND VERIFIED WITH: Joellen Jersey PHARMD, AT 4098 05/23/23 D. Leighton Roach    Culture (A)  Final    ESCHERICHIA COLI SUSCEPTIBILITIES TO FOLLOW Performed at [facility], [address], [place], [place] [address]    Report Status PENDING  Incomplete  Blood Culture ID Panel (Reflexed)     Status: Abnormal   Collection Time: 05/22/23  5:34 PM  Result Value Ref Range Status   Enterococcus faecalis NOT DETECTED NOT DETECTED Final   Enterococcus Faecium NOT DETECTED NOT DETECTED Final   Listeria monocytogenes NOT DETECTED NOT DETECTED Final  Staphylococcus species NOT DETECTED NOT DETECTED Final   Staphylococcus aureus (BCID) NOT DETECTED NOT DETECTED Final   Staphylococcus epidermidis NOT DETECTED NOT DETECTED Final   Staphylococcus lugdunensis NOT DETECTED NOT DETECTED Final   Streptococcus species NOT DETECTED NOT DETECTED Final   Streptococcus agalactiae NOT DETECTED NOT DETECTED Final   Streptococcus pneumoniae NOT DETECTED NOT DETECTED Final   Streptococcus pyogenes NOT DETECTED NOT DETECTED Final   A.calcoaceticus-baumannii NOT DETECTED NOT DETECTED Final   Bacteroides fragilis NOT DETECTED NOT DETECTED Final   Enterobacterales DETECTED (A) NOT DETECTED Final    Comment: Enterobacterales represent a large order of gram negative bacteria, not a single organism. CRITICAL RESULT CALLED TO, READ BACK BY AND VERIFIED WITH: S. MOODY PHARMD, AT 1610 05/23/23 D. VANHOOK    Enterobacter cloacae complex NOT DETECTED NOT DETECTED Final    Escherichia coli DETECTED (A) NOT DETECTED Final    Comment: CRITICAL RESULT CALLED TO, READ BACK BY AND VERIFIED WITH: S. MOODY PHARMD, AT 9604 05/23/23 D. VANHOOK    Klebsiella aerogenes NOT DETECTED NOT DETECTED Final   Klebsiella oxytoca NOT DETECTED NOT DETECTED Final   Klebsiella pneumoniae NOT DETECTED NOT DETECTED Final   Proteus species NOT DETECTED NOT DETECTED Final   Salmonella species NOT DETECTED NOT DETECTED Final   Serratia marcescens NOT DETECTED NOT DETECTED Final   Haemophilus influenzae NOT DETECTED NOT DETECTED Final   Neisseria meningitidis NOT DETECTED NOT DETECTED Final   Pseudomonas aeruginosa NOT DETECTED NOT DETECTED Final   Stenotrophomonas maltophilia NOT DETECTED NOT DETECTED Final   Candida albicans NOT DETECTED NOT DETECTED Final   Candida auris NOT DETECTED NOT DETECTED Final   Candida glabrata NOT DETECTED NOT DETECTED Final   Candida krusei NOT DETECTED NOT DETECTED Final   Candida parapsilosis NOT DETECTED NOT DETECTED Final   Candida tropicalis NOT DETECTED NOT DETECTED Final   Cryptococcus neoformans/gattii NOT DETECTED NOT DETECTED Final   CTX-M ESBL NOT DETECTED NOT DETECTED Final   Carbapenem resistance IMP NOT DETECTED NOT DETECTED Final   Carbapenem resistance KPC NOT DETECTED NOT DETECTED Final   Carbapenem resistance NDM NOT DETECTED NOT DETECTED Final   Carbapenem resist OXA 48 LIKE NOT DETECTED NOT DETECTED Final   Carbapenem resistance VIM NOT DETECTED NOT DETECTED Final    Comment: Performed at [facility], [address], [place], [place] [address]  Resp panel by RT-PCR (RSV, Flu A&B, Covid) Anterior Nasal Swab     Status: None   Collection Time: 05/22/23  6:54 PM   Specimen: Anterior Nasal Swab  Result Value Ref Range Status   SARS Coronavirus 2 by RT PCR NEGATIVE NEGATIVE Final   Influenza A by PCR NEGATIVE NEGATIVE Final   Influenza B by PCR NEGATIVE NEGATIVE Final    Comment: (NOTE) The Xpert Xpress SARS-CoV-2/FLU/RSV  plus assay is intended as an aid in the diagnosis of influenza from Nasopharyngeal swab specimens and should not be used as a sole basis for treatment. Nasal washings and aspirates are unacceptable for Xpert Xpress SARS-CoV-2/FLU/RSV testing.  Fact Sheet for Patients: BloggerCourse.com  Fact Sheet for Healthcare Providers: SeriousBroker.it  This test is not yet approved or cleared by the [place] FDA and has been authorized for detection and/or diagnosis of SARS-CoV-2 by FDA under an Emergency Use Authorization (EUA). This EUA will remain in effect (meaning this test can be used) for the duration of the COVID-19 declaration under Section 564(b)(1) of the Act, 21 U.S.C. section 360bbb-3(b)(1), unless the authorization is terminated  or revoked.     Resp Syncytial Virus by PCR NEGATIVE NEGATIVE Final    Comment: (NOTE) Fact Sheet for Patients: BloggerCourse.com  Fact Sheet for Healthcare Providers: SeriousBroker.it  This test is not yet approved or cleared by the [place] FDA and has been authorized for detection and/or diagnosis of SARS-CoV-2 by FDA under an Emergency Use Authorization (EUA). This EUA will remain in effect (meaning this test can be used) for the duration of the COVID-19 declaration under Section 564(b)(1) of the Act, 21 U.S.C. section 360bbb-3(b)(1), unless the authorization is terminated or revoked.  Performed at [facility], [address], [place], [place] [address]   Blood Culture (routine x 2)     Status: None (Preliminary result)   Collection Time: 05/22/23  8:34 PM   Specimen: BLOOD  Result Value Ref Range Status   Specimen Description BLOOD RIGHT ANTECUBITAL  Final   Special Requests   Final    BOTTLES DRAWN AEROBIC AND ANAEROBIC Blood Culture adequate volume   Culture   Final    NO GROWTH 2 DAYS Performed at [facility]  [facility], [address] [place], [place] [address]    Report Status PENDING  Incomplete  Culture, blood (x 2)     Status: None (Preliminary result)   Collection Time: 05/23/23  3:06 AM   Specimen: BLOOD RIGHT ARM  Result Value Ref Range Status   Specimen Description BLOOD RIGHT ARM  Final   Special Requests   Final    BOTTLES DRAWN AEROBIC AND ANAEROBIC Blood Culture results may not be optimal due to an excessive volume of blood received in culture bottles   Culture   Final    NO GROWTH 1 DAY Performed at [facility], [address] [place], [place] [address]    Report Status PENDING  Incomplete  Culture, blood (x 2)     Status: None (Preliminary result)   Collection Time: 05/23/23  3:06 AM   Specimen: BLOOD RIGHT HAND  Result Value Ref Range Status   Specimen Description BLOOD RIGHT HAND  Final   Special Requests   Final    BOTTLES DRAWN AEROBIC AND ANAEROBIC Blood Culture adequate volume   Culture   Final    NO GROWTH 1 DAY Performed at [facility], [address] [place], [place] [address]    Report Status PENDING  Incomplete    Radiology Studies:  CT ABDOMEN PELVIS W CONTRAST  Result Date: 05/22/2023 CLINICAL DATA:  Sepsis, abdominal pain, acute, non localized EXAM: CT ABDOMEN AND PELVIS WITH CONTRAST TECHNIQUE: Multidetector CT imaging of the abdomen and pelvis was performed using the standard protocol following bolus administration of intravenous contrast. RADIATION DOSE REDUCTION: This exam was performed according to the departmental dose-optimization program which includes automated exposure control, adjustment of the mA and/or kV according to patient size and/or use of iterative reconstruction technique. CONTRAST:  75mL OMNIPAQUE IOHEXOL 350 MG/ML SOLN COMPARISON:  CT abdomen and pelvis 09/29/2021 FINDINGS: Lower chest: No acute abnormality. Hepatobiliary: Unremarkable liver. Normal gallbladder. No biliary dilation. Pancreas: Unremarkable. Spleen: Unremarkable.  Adrenals/Urinary Tract: Normal adrenal glands. Geographic cortical hypoattenuation within the right kidney compatible with pyelonephritis. No urinary calculi or hydronephrosis. Bladder is unremarkable. Stomach/Bowel: Normal caliber large and small bowel. No bowel wall thickening. The appendix is normal.Stomach is within normal limits. Vascular/Lymphatic: No significant vascular findings are present. No enlarged abdominal or pelvic lymph nodes. Reproductive: Unremarkable. Other: No free intraperitoneal fluid or air. Musculoskeletal: No  acute fracture. Chronic compression fracture of L4 is unchanged. IMPRESSION: Right pyelonephritis. Electronically Signed   By: Minerva Fester M.D.   On: 05/22/2023 20:06   CT HEAD WO CONTRAST ( )  Result Date: 05/22/2023 CLINICAL DATA:  Weakness with multiple falls, confusion, urinary incontinence EXAM: CT HEAD WITHOUT CONTRAST TECHNIQUE: Contiguous axial images were obtained from the base of the skull through the vertex without intravenous contrast. RADIATION DOSE REDUCTION: This exam was performed according to the departmental dose-optimization program which includes automated exposure control, adjustment of the mA and/or kV according to patient size and/or use of iterative reconstruction technique. COMPARISON:  02/19/2023 FINDINGS: Brain: No evidence of acute infarction, hemorrhage, mass, mass effect, or midline shift. No hydrocephalus or extra-axial fluid collection. Normal cerebral volume for age. Vascular: No hyperdense vessel. Skull: Negative for fracture or focal lesion. Sinuses/Orbits: No acute finding. Other: The mastoid air cells are well aerated. IMPRESSION: No acute intracranial process. Electronically Signed   By: Wiliam Ke M.D.   On: 05/22/2023 19:27   DG Chest Port 1 View  Result Date: 05/22/2023 CLINICAL DATA:  Weakness and multiple falls. EXAM: PORTABLE CHEST 1 VIEW COMPARISON:  02/19/2023 FINDINGS: Stable cardiomediastinal silhouette. Aortic  atherosclerotic calcification. No focal consolidation, pleural effusion, or pneumothorax. No displaced rib fractures. IMPRESSION: No acute cardiopulmonary disease. Electronically Signed   By: Minerva Fester M.D.   On: 05/22/2023 19:01    Scheduled Meds:    ALPRAZolam  0.25 mg Oral BID   aspirin EC  81 mg Oral Daily   calcium carbonate  625 mg Oral Daily   carbidopa-levodopa  2 tablet Oral 2 times per day   And   carbidopa-levodopa  1.5 tablet Oral Daily   feeding supplement  237 mL Oral BID BM   folic acid  1 mg Oral Daily   lisinopril  2.5 mg Oral Daily   magnesium oxide  200 mg Oral Daily   mirtazapine  7.5 mg Oral QHS   polyethylene glycol  17 g Oral Daily   rOPINIRole  0.125 mg Oral QHS   thiamine  200 mg Oral Daily    Continuous Infusions:    cefTRIAXone (ROCEPHIN)  IV 2 g (05/24/23 0609)   potassium PHOSPHATE IVPB (in mmol) 30 mmol (05/24/23 1017)     LOS: 2 days     Marcellus Scott, MD,  FACP, FHM, [facility], [facility], [employer]   [facility] [place]      To contact the attending provider between 7A-7P or the covering provider during after hours 7P-7A, please log into the web site [email or web address] and access using universal [place] password for that web site. If you do not have the password, please call the hospital operator.  05/24/2023, 11:01 AM

## 2023-05-24 NOTE — NC FL2 (Signed)
[place] MEDICAID FL2 LEVEL OF CARE FORM     IDENTIFICATION  Patient Name: Hannah Fisher Birthdate: July 22, 1951 Sex: female Admission Date (Current Location): 05/22/2023  [facility] and [place] Number:  Producer, television/film/video and Address:  [facility], [address], [place], [place] [address]      Provider Number: [id number]  Attending Physician Name and Address:  Hannah Etienne, MD  Relative Name and Phone Number:       Current Level of Care: Hospital Recommended Level of Care: Skilled Nursing Facility Prior Approval Number:    Date Approved/Denied:   PASRR Number: [id number]  Discharge Plan: SNF    Current Diagnoses: Patient Active Problem List   Diagnosis Date Noted   Pyelonephritis 05/23/2023   Sepsis (HCC) 05/22/2023   Hypokalemia 02/19/2023   HTN (hypertension) 02/19/2023   Weakness 02/19/2023   Impairment of balance 08/23/2021   Parkinson disease 01/28/2021   Abnormal gait 01/24/2021   Allergic rhinitis 01/24/2021   Allergic rhinitis due to animal (cat) (dog) hair and dander 01/24/2021   Anxiety 01/24/2021   Chronic constipation 01/24/2021   Difficulty sleeping 01/24/2021   Gastritis 01/24/2021   High cholesterol 01/24/2021   Protein calorie malnutrition (HCC) 01/24/2021   Rectal bleeding 01/24/2021   Recurrent falls 01/24/2021   Restless legs 01/24/2021   Sciatica 01/24/2021   Standard chest x-ray abnormal 01/24/2021   Vitamin Fisher deficiency 01/24/2021   Acute encephalopathy 12/28/2020   Rhabdomyolysis 12/28/2020   Lightheadedness 06/22/2020   Other abnormalities of gait and mobility 05/03/2020   Lumbar radiculopathy 04/28/2020   Dysphonia 12/03/2019   Age-related osteoporosis without current pathological fracture 11/25/2019   Dysfunction of both eustachian tubes 05/19/2019   Presbycusis of both ears 05/19/2019   Unilateral deafness, left 04/07/2019   Mass 04/17/2018   Deafness    Shingles    Osteoporosis     Hypertension     Orientation RESPIRATION BLADDER Height & Weight     Self  Normal Incontinent Weight: 91 lb 7.9 oz (41.5 kg) Height:     BEHAVIORAL SYMPTOMS/MOOD NEUROLOGICAL BOWEL NUTRITION STATUS      Continent Diet (see discharge summary)  AMBULATORY STATUS COMMUNICATION OF NEEDS Skin   Extensive Assist Verbally Normal                       Personal Care Assistance Level of Assistance  Bathing, Feeding, Dressing Bathing Assistance: Maximum assistance Feeding assistance: Independent Dressing Assistance: Maximum assistance     Functional Limitations Info  Sight, Hearing, Speech Sight Info: Adequate Hearing Info: Adequate Speech Info: Adequate    SPECIAL CARE FACTORS FREQUENCY  PT (By licensed PT), OT (By licensed OT)     PT Frequency: 5x/week OT Frequency: 5x/week            Contractures Contractures Info: Not present    Additional Factors Info  Code Status, Allergies Code Status Info: FULL Allergies Info: Prednisone, Beta Adrenergic Blockers, Penicillins, Sulfa Antibiotics           Current Medications (05/24/2023):  This is the current hospital active medication list Current Facility-Administered Medications  Medication Dose Route Frequency Provider Last Rate Last Admin   acetaminophen (TYLENOL) tablet 650 mg  650 mg Oral Q6H PRN Hannah Doyne, MD   650 mg at 05/23/23 2149   Or   acetaminophen (TYLENOL) suppository 650 mg  650 mg Rectal Q6H PRN Hannah Doyne, MD       ALPRAZolam Prudy Feeler) tablet  0.25 mg  0.25 mg Oral BID Hannah Scott D, MD   0.25 mg at 05/24/23 1019   aspirin EC tablet 81 mg  81 mg Oral Daily Hannah Doyne, MD   81 mg at 05/24/23 1023   calcium carbonate (OS-CAL - dosed in mg of elemental calcium) tablet 625 mg  625 mg Oral Daily Hannah Scott D, MD   625 mg at 05/24/23 1022   carbidopa-levodopa (SINEMET IR) 25-100 MG per tablet immediate release 2 tablet  2 tablet Oral 2 times per day Hannah Doyne, MD   2  tablet at 05/24/23 1250   And   carbidopa-levodopa (SINEMET IR) 25-100 MG per tablet immediate release 1.5 tablet  1.5 tablet Oral Daily Doutova, Anastassia, MD   1.5 tablet at 05/23/23 1801   cefTRIAXone (ROCEPHIN) 2 g in sodium chloride 0.9 % 100 mL IVPB  2 g Intravenous Q24H Doutova, Anastassia, MD 200 mL/hr at 05/24/23 0609 2 g at 05/24/23 8295   feeding supplement (ENSURE ENLIVE / ENSURE PLUS) liquid 237 mL  237 mL Oral BID BM Doutova, Anastassia, MD   237 mL at 05/24/23 1402   folic acid (FOLVITE) tablet 1 mg  1 mg Oral Daily Hongalgi, Anand D, MD   1 mg at 05/24/23 1022   lisinopril (ZESTRIL) tablet 2.5 mg  2.5 mg Oral Daily Hongalgi, Theadora Rama D, MD   2.5 mg at 05/24/23 1032   magnesium oxide (MAG-OX) tablet 200 mg  200 mg Oral Daily Hannah Scott D, MD   200 mg at 05/24/23 1019   mirtazapine (REMERON) tablet 7.5 mg  7.5 mg Oral QHS Hongalgi, Anand D, MD   7.5 mg at 05/23/23 2148   ondansetron (ZOFRAN) tablet 4 mg  4 mg Oral Q6H PRN Hannah Doyne, MD       Or   ondansetron (ZOFRAN) injection 4 mg  4 mg Intravenous Q6H PRN Doutova, Anastassia, MD       polyethylene glycol (MIRALAX / GLYCOLAX) packet 17 g  17 g Oral Daily Hongalgi, Anand D, MD   17 g at 05/24/23 1023   rOPINIRole (REQUIP) tablet 0.125 mg  0.125 mg Oral QHS Doutova, Anastassia, MD   0.125 mg at 05/23/23 2148   thiamine (VITAMIN B1) tablet 200 mg  200 mg Oral Daily Hannah Etienne, MD   200 mg at 05/24/23 1021     Discharge Medications: Please see discharge summary for a list of discharge medications.  Relevant Imaging Results:  Relevant Lab Results:   Additional Information SSN: [id number] [address]  Hannah Fisher, [place]

## 2023-05-25 DIAGNOSIS — N12 Tubulo-interstitial nephritis, not specified as acute or chronic: Secondary | ICD-10-CM | POA: Diagnosis not present

## 2023-05-25 DIAGNOSIS — E876 Hypokalemia: Secondary | ICD-10-CM | POA: Diagnosis not present

## 2023-05-25 DIAGNOSIS — A419 Sepsis, unspecified organism: Secondary | ICD-10-CM | POA: Diagnosis not present

## 2023-05-25 LAB — URINE CULTURE: Culture: NO GROWTH

## 2023-05-25 LAB — BASIC METABOLIC PANEL
Anion gap: 9 (ref 5–15)
BUN: 8 mg/dL (ref 8–23)
CO2: 20 mmol/L — ABNORMAL LOW (ref 22–32)
Calcium: 8.1 mg/dL — ABNORMAL LOW (ref 8.9–10.3)
Chloride: 104 mmol/L (ref 98–111)
Creatinine, Ser: 0.58 mg/dL (ref 0.44–1.00)
GFR, Estimated: >60 mL/min (ref >60)
Glucose, Bld: 96 mg/dL (ref 70–99)
Potassium: 3.8 mmol/L (ref 3.5–5.1)
Sodium: 133 mmol/L — ABNORMAL LOW (ref 135–145)

## 2023-05-25 LAB — CULTURE, BLOOD (ROUTINE X 2): Special Requests: ADEQUATE

## 2023-05-25 LAB — PHOSPHORUS: Phosphorus: 2.6 mg/dL (ref 2.5–4.6)

## 2023-05-25 MED ORDER — LEVOFLOXACIN 250 MG PO TABS
250.0000 mg | ORAL_TABLET | Freq: Every day | ORAL | Status: AC
  Administered 2023-05-26 – 2023-05-29 (×4): 250 mg via ORAL
  Filled 2023-05-25 (×4): qty 1

## 2023-05-25 MED ORDER — LEVOFLOXACIN 500 MG PO TABS: 500.0000 mg | ORAL_TABLET | Freq: Every day | ORAL | Status: DC

## 2023-05-25 NOTE — TOC Progression Note (Signed)
Transition of Care [facility]) - Progression Note    Patient Details  Name: Hannah Fisher MRN: [id number] Date of Birth: 06-09-1951  [facility] of [facility]) CM/SW Contact  Ariz Terrones A [place], [place] Phone Number: 05/25/2023, 4:08 PM  Clinical Narrative:     CSW contacted pt's daughter to provide bed offers as pt is not oriented enough to make decisions. CSW left voicemail with contact information to reach back out to CSW with offers. CSW messaged via HIPAA compliant text bed offers as well.   TOC will continue to follow.   Expected Discharge Plan: Skilled Nursing Facility Barriers to Discharge: Continued Medical Work up, English as a second language teacher, SNF Pending bed offer  Expected Discharge Plan and Services In-house Referral: Clinical Social Work     Living arrangements for the past 2 months: Independent Living Facility ([facility])                                       Social Determinants of Health (SDOH) Interventions SDOH Screenings   Food Insecurity: No Food Insecurity (05/23/2023)  Housing: Low Risk  (05/23/2023)  Transportation Needs: No Transportation Needs (05/23/2023)  Utilities: Not At Risk (05/23/2023)  Tobacco Use: Low Risk  (05/22/2023)    Readmission Risk Interventions     No data to display

## 2023-05-25 NOTE — Progress Notes (Signed)
E.coli came back pan sensitive. Micro said that the MIC to cefazolin was <=4. Due to the uncertainty, we will change her ceftriaxone to PO levaquin to complete 7d after discussing with Dr. Waymon Amato. She has received the ceftriaxone dose today so we will start levaquin in AM.  Ulyses Southward, PharmD, BCIDP, AAHIVP, CPP Infectious Disease Pharmacist 05/25/2023 3:12 PM

## 2023-05-25 NOTE — Progress Notes (Signed)
Physical Therapy Treatment Patient Details Name: Hannah Fisher MRN: [id number] DOB: 1951/08/10 Today's Date: 05/25/2023   History of Present Illness Pt is a 72 y/o female presenting with frequent falls, progressive weakness, fever and confusion in setting of E. Coli bacteremia/sepsis from R acute pyelonephritis and acute on chronic metabolic encephalopathy. PMH: Parkinson's disease, HTN, cognitive impairment.    PT Comments  Pt making slow, steady progress. Continue to feel patient will benefit from continued inpatient follow up therapy, <3 hours/day     If plan is discharge home, recommend the following: A lot of help with walking and/or transfers;A lot of help with bathing/dressing/bathroom;Direct supervision/assist for medications management;Direct supervision/assist for financial management;Assistance with cooking/housework   Can travel by private vehicle     No  Equipment Recommendations  Other (comment) (TBD)    Recommendations for Other Services       Precautions / Restrictions Precautions Precautions: Fall Restrictions Weight Bearing Restrictions: No     Mobility  Bed Mobility Overal bed mobility: Needs Assistance Bed Mobility: Supine to Sit     Supine to sit: Mod assist, HOB elevated, Used rails     General bed mobility comments: Assist to bring legs off of bed, elevate trunk into sitting and bring hips to EOb    Transfers Overall transfer level: Needs assistance Equipment used: Rolling walker (2 wheels) Transfers: Sit to/from Stand, Bed to chair/wheelchair/BSC Sit to Stand: Mod assist           General transfer comment: Assist to power up and for balance with pt exhibiting strong posterior bias. Pt with knees flexed and on toes with heels off of floor. Chair to [facility] to chair with walker and mod assist    Ambulation/Gait Ambulation/Gait assistance: Mod assist, +2 safety/equipment Gait Distance (Feet): 15 Feet Assistive device: Rolling walker (2  wheels) Gait Pattern/deviations: Step-to pattern, Decreased step length - right, Decreased step length - left, Knee flexed in stance - right, Knee flexed in stance - left, Shuffle, Trunk flexed, Narrow base of support Gait velocity: decr Gait velocity interpretation: <1.31 ft/sec, indicative of household ambulator   General Gait Details: Assist for balance and support and verbal/tactile cues to extend knees and put heels on ground. Close chair follow.   Stairs             Wheelchair Mobility     Tilt Bed    Modified Rankin (Stroke Patients Only)       Balance Overall balance assessment: Needs assistance Sitting-balance support: Feet supported, No upper extremity supported Sitting balance-Leahy Scale: Fair     Standing balance support: Bilateral upper extremity supported, During functional activity Standing balance-Leahy Scale: Poor Standing balance comment: walker and mod assist for static standing                            Cognition Arousal: Alert Behavior During Therapy: Flat affect Overall Cognitive Status: History of cognitive impairments - at baseline                                 General Comments: hx of Parkinson's, poor safety awareness and awareness of deficits        Exercises      General Comments        Pertinent Vitals/Pain Pain Assessment Pain Assessment: Faces Faces Pain Scale: No hurt    Home Living  Prior Function            PT Goals (current goals can now be found in the care plan section) Progress towards PT goals: Progressing toward goals    Frequency    Min 1X/week      PT Plan      Co-evaluation              AM-PAC PT "6 Clicks" Mobility   Outcome Measure  Help needed turning from your back to your side while in a flat bed without using bedrails?: A Lot Help needed moving from lying on your back to sitting on the side of a flat bed without using  bedrails?: A Lot Help needed moving to and from a bed to a chair (including a wheelchair)?: A Lot Help needed standing up from a chair using your arms (e.g., wheelchair or bedside chair)?: A Lot Help needed to walk in hospital room?: A Lot Help needed climbing 3-5 steps with a railing? : Total 6 Click Score: 11    End of Session Equipment Utilized During Treatment: Gait belt Activity Tolerance: Patient tolerated treatment well Patient left: with call bell/phone within reach;in chair;with chair alarm set Nurse Communication: Mobility status (Purewick out) PT Visit Diagnosis: Unsteadiness on feet (R26.81);Muscle weakness (generalized) (M62.81)     Time: 1030-1057 PT Time Calculation (min) (ACUTE ONLY): 27 min  Charges:    $Gait Training: 23-37 mins PT General Charges $$ ACUTE PT VISIT: 1 Visit                     [facility] PT Acute Rehabilitation Services Office [phone]    Angelina Ok [facility] 05/25/2023, 3:25 PM

## 2023-05-25 NOTE — Progress Notes (Signed)
PROGRESS NOTE   Hannah Fisher  ZOX:[id number]    DOB: 1951-01-18    DOA: 05/22/2023  PCP: Daisy Floro, MD   I have briefly reviewed patients previous medical records in [facility].  Chief Complaint  Patient presents with   Weakness   Altered Mental Status    Brief Narrative:  72 year old female, resident of [facility], PMH of Parkinson's disease, deafness, cognitive impairment, HTN, presented to the ED on 05/22/2023 with complaints of fever, fatigue, falls, and increased confusion.  Reportedly had a similar admission in May 2024.  Admitted for sepsis due to E. coli bacteremia from right acute pyelonephritis and acute on chronic metabolic encephalopathy.   Assessment & Plan:  Principal Problem:   Sepsis (HCC) Active Problems:   Hypertension   Acute encephalopathy   Parkinson disease   Pyelonephritis   E. coli bacteremia, sepsis from right-sided pyelonephritis: Patient is a poor historian and cannot get history of UTI symptoms from her. Met sepsis criteria on admission: Temperature of 100.8, transient tachypnea of 22/min, persistent tachycardia for a couple of hours with heart rates up to 119/min, confirmed source based on CT abdomen with pyelonephritis and treating with IV antibiotics.  Had target organ dysfunction as evidenced by acute on chronic AMS. Extreme delays in obtaining urine culture, eventually sent after antibiotics it appears, no growth.  Unreliable. Blood cultures 1/4 confirms E. coli, pansensitive.  Lactate normal.  Procalcitonin elevated at 3.88. CT abdomen showed right pyelonephritis. As per communication with ID on 8/23, although E. coli pansensitive, MIC to cefazolin was less than 4 and due to uncertainty levofloxacin p.o. was recommended to complete total 7 days course.  Acute septic encephalopathy complicating underlying chronic encephalopathy: CT head without acute findings Likely secondary to pyelonephritis and sepsis. Treatment as  above and monitor. Delirium precautions. As per discussion with daughter on 8/22, follows with neurology at [facility].  She has been diagnosed with dementia.  As per daughter, she has at least moderate dementia and current mental status is clearly worse than her baseline. Multifactorial AMS due to infection, hospitalization, complicating underlying dementia. Continue her scheduled Xanax (in the past when this has been changed to as needed, got more agitated per daughter). Pleasantly confused without agitation.  Daughter not at bedside to see if she has made any improvement compared to yesterday  Normocytic anemia: Unclear why anemia panel was requested.  Folate 25.3, ferritin 125, B12: 2274.  Stable Outpatient follow-up.  Falls: PT and OT evaluation. SNF recommended.  TOC consulted. As per daughter, [facility] had [facility] prior to [facility].  TOC on board  Hypertension: Reasonably controlled.  Continue lisinopril 2.5 Mg daily  Parkinson's disease: Continue home Sinemet doses.  Hypokalemia: Replaced.  Hypophosphatemia: Replaced  Body mass index is 16.25 kg/m.  ACP Documents: None present DVT prophylaxis: SCDs Start: 05/22/23 2338     Code Status: Full Code:  Family Communication: None at bedside Disposition:  Status is: Inpatient Medically optimized for DC to SNF pending bed.     Consultants:     Procedures:     Antimicrobials:   IV ceftriaxone 8/20 >   Subjective:  Pleasantly confused.  Oriented to self and place.  Then started speaking unrelated things that do not make sense.  Objective:   Vitals:   05/24/23 1949 05/25/23 0040 05/25/23 0454 05/25/23 0714  BP: (!) 135/93 131/88 (!) 140/91 138/89  Pulse: 89 89 88 87  Resp: 18 20 18  18  Temp: 98.2 F (36.8 C) 98.6 F (37 C) 97.6 F (36.4 C) 98.4 F (36.9 C)  TempSrc: Oral Oral Oral Oral  SpO2: 99% 100% 98% 100%  Weight:   39 kg     General exam: Elderly  female, moderately built and thinly nourished lying comfortable propped up in bed.  Pleasantly confused but not agitated.  Oral mucosa moist Respiratory system: Clear to auscultation. Respiratory effort normal. Cardiovascular system: S1 & S2 heard, RRR. No JVD, murmurs, rubs, gallops or clicks. No pedal edema.  No longer on telemetry. Gastrointestinal system: Abdomen is nondistended, soft and nontender. No organomegaly or masses felt. Normal bowel sounds heard.  No renal angle tenderness. Central nervous system: Alert and oriented to self and place. No focal neurological deficits. Extremities: Symmetric 5 x 5 power. Skin: No rashes, lesions or ulcers Psychiatry: Judgement and insight impaired. Mood & affect appropriate.     Data Reviewed:   I have personally reviewed following labs and imaging studies   CBC: Recent Labs  Lab 05/22/23 1801 05/23/23 0307 05/24/23 0005  WBC 9.8 7.4 5.3  NEUTROABS 8.8* 6.3  --   HGB 13.1 11.4* 11.1*  HCT 40.6 35.4* 34.3*  MCV 93.5 95.7 93.0  PLT 207 179 169    Basic Metabolic Panel: Recent Labs  Lab 05/22/23 1801 05/23/23 0305 05/24/23 0005 05/24/23 1823 05/25/23 0854  NA 137 138 136  --  133*  K 3.9 3.7 3.2*  --  3.8  CL 102 106 108  --  104  CO2 21* 20* 20*  --  20*  GLUCOSE 109* 101* 106*  --  96  BUN 37* 25* 14  --  8  CREATININE 1.01* 0.98 0.76  --  0.58  CALCIUM 9.5 8.6* 8.2*  --  8.1*  MG  --  2.2  --   --   --   PHOS  --  2.2* 1.6* 2.8 2.6    Liver Function Tests: Recent Labs  Lab 05/22/23 1801 05/23/23 0305  AST 23 20  ALT <5 9  ALKPHOS 106 74  BILITOT 0.9 0.6  PROT 7.4 5.6*  ALBUMIN 3.8 2.9*    CBG: No results for input(s): "GLUCAP" in the last 168 hours.  Microbiology Studies:   Recent Results (from the past 240 hour(s))  Blood Culture (routine x 2)     Status: Abnormal   Collection Time: 05/22/23  5:34 PM   Specimen: BLOOD  Result Value Ref Range Status   Specimen Description BLOOD RIGHT ANTECUBITAL   Final   Special Requests   Final    BOTTLES DRAWN AEROBIC AND ANAEROBIC Blood Culture adequate volume   Culture  Setup Time   Final    GRAM NEGATIVE RODS IN BOTH AEROBIC AND ANAEROBIC BOTTLES CRITICAL RESULT CALLED TO, READ BACK BY AND VERIFIED WITHMargarita Sermons, AT 4098 05/23/23 Renato Shin Performed at [facility], [address], [place], [place] [address]    Culture ESCHERICHIA COLI (A)  Final   Report Status 05/25/2023 FINAL  Final   Organism ID, Bacteria ESCHERICHIA COLI  Final      Susceptibility   Escherichia coli - MIC*    AMPICILLIN <=2 SENSITIVE Sensitive     CEFEPIME <=0.12 SENSITIVE Sensitive     CEFTAZIDIME <=1 SENSITIVE Sensitive     CEFTRIAXONE <=0.25 SENSITIVE Sensitive     CIPROFLOXACIN <=0.25 SENSITIVE Sensitive     GENTAMICIN <=1 SENSITIVE Sensitive     IMIPENEM <=0.25 SENSITIVE Sensitive  TRIMETH/SULFA <=20 SENSITIVE Sensitive     AMPICILLIN/SULBACTAM <=2 SENSITIVE Sensitive     PIP/TAZO <=4 SENSITIVE Sensitive     * ESCHERICHIA COLI  Blood Culture ID Panel (Reflexed)     Status: Abnormal   Collection Time: 05/22/23  5:34 PM  Result Value Ref Range Status   Enterococcus faecalis NOT DETECTED NOT DETECTED Final   Enterococcus Faecium NOT DETECTED NOT DETECTED Final   Listeria monocytogenes NOT DETECTED NOT DETECTED Final   Staphylococcus species NOT DETECTED NOT DETECTED Final   Staphylococcus aureus (BCID) NOT DETECTED NOT DETECTED Final   Staphylococcus epidermidis NOT DETECTED NOT DETECTED Final   Staphylococcus lugdunensis NOT DETECTED NOT DETECTED Final   Streptococcus species NOT DETECTED NOT DETECTED Final   Streptococcus agalactiae NOT DETECTED NOT DETECTED Final   Streptococcus pneumoniae NOT DETECTED NOT DETECTED Final   Streptococcus pyogenes NOT DETECTED NOT DETECTED Final   A.calcoaceticus-baumannii NOT DETECTED NOT DETECTED Final   Bacteroides fragilis NOT DETECTED NOT DETECTED Final   Enterobacterales DETECTED (A) NOT DETECTED  Final    Comment: Enterobacterales represent a large order of gram negative bacteria, not a single organism. CRITICAL RESULT CALLED TO, READ BACK BY AND VERIFIED WITH: S. MOODY PHARMD, AT 9147 05/23/23 D. VANHOOK    Enterobacter cloacae complex NOT DETECTED NOT DETECTED Final   Escherichia coli DETECTED (A) NOT DETECTED Final    Comment: CRITICAL RESULT CALLED TO, READ BACK BY AND VERIFIED WITH: S. MOODY PHARMD, AT 8295 05/23/23 D. VANHOOK    Klebsiella aerogenes NOT DETECTED NOT DETECTED Final   Klebsiella oxytoca NOT DETECTED NOT DETECTED Final   Klebsiella pneumoniae NOT DETECTED NOT DETECTED Final   Proteus species NOT DETECTED NOT DETECTED Final   Salmonella species NOT DETECTED NOT DETECTED Final   Serratia marcescens NOT DETECTED NOT DETECTED Final   Haemophilus influenzae NOT DETECTED NOT DETECTED Final   Neisseria meningitidis NOT DETECTED NOT DETECTED Final   Pseudomonas aeruginosa NOT DETECTED NOT DETECTED Final   Stenotrophomonas maltophilia NOT DETECTED NOT DETECTED Final   Candida albicans NOT DETECTED NOT DETECTED Final   Candida auris NOT DETECTED NOT DETECTED Final   Candida glabrata NOT DETECTED NOT DETECTED Final   Candida krusei NOT DETECTED NOT DETECTED Final   Candida parapsilosis NOT DETECTED NOT DETECTED Final   Candida tropicalis NOT DETECTED NOT DETECTED Final   Cryptococcus neoformans/gattii NOT DETECTED NOT DETECTED Final   CTX-M ESBL NOT DETECTED NOT DETECTED Final   Carbapenem resistance IMP NOT DETECTED NOT DETECTED Final   Carbapenem resistance KPC NOT DETECTED NOT DETECTED Final   Carbapenem resistance NDM NOT DETECTED NOT DETECTED Final   Carbapenem resist OXA 48 LIKE NOT DETECTED NOT DETECTED Final   Carbapenem resistance VIM NOT DETECTED NOT DETECTED Final    Comment: Performed at [facility], [address], [place], [place] [address]  Resp panel by RT-PCR (RSV, Flu A&B, Covid) Anterior Nasal Swab     Status: None   Collection Time:  05/22/23  6:54 PM   Specimen: Anterior Nasal Swab  Result Value Ref Range Status   SARS Coronavirus 2 by RT PCR NEGATIVE NEGATIVE Final   Influenza A by PCR NEGATIVE NEGATIVE Final   Influenza B by PCR NEGATIVE NEGATIVE Final    Comment: (NOTE) The Xpert Xpress SARS-CoV-2/FLU/RSV plus assay is intended as an aid in the diagnosis of influenza from Nasopharyngeal swab specimens and should not be used as a sole basis for treatment. Nasal washings and aspirates are unacceptable for Xpert  Xpress SARS-CoV-2/FLU/RSV testing.  Fact Sheet for Patients: BloggerCourse.com  Fact Sheet for Healthcare Providers: SeriousBroker.it  This test is not yet approved or cleared by the [place] FDA and has been authorized for detection and/or diagnosis of SARS-CoV-2 by FDA under an Emergency Use Authorization (EUA). This EUA will remain in effect (meaning this test can be used) for the duration of the COVID-19 declaration under Section 564(b)(1) of the Act, 21 U.S.C. section 360bbb-3(b)(1), unless the authorization is terminated or revoked.     Resp Syncytial Virus by PCR NEGATIVE NEGATIVE Final    Comment: (NOTE) Fact Sheet for Patients: BloggerCourse.com  Fact Sheet for Healthcare Providers: SeriousBroker.it  This test is not yet approved or cleared by the [place] FDA and has been authorized for detection and/or diagnosis of SARS-CoV-2 by FDA under an Emergency Use Authorization (EUA). This EUA will remain in effect (meaning this test can be used) for the duration of the COVID-19 declaration under Section 564(b)(1) of the Act, 21 U.S.C. section 360bbb-3(b)(1), unless the authorization is terminated or revoked.  Performed at [facility], [address] [place], [place] [address]   Blood Culture (routine x 2)     Status: None (Preliminary result)   Collection Time: 05/22/23   8:34 PM   Specimen: BLOOD  Result Value Ref Range Status   Specimen Description BLOOD RIGHT ANTECUBITAL  Final   Special Requests   Final    BOTTLES DRAWN AEROBIC AND ANAEROBIC Blood Culture adequate volume   Culture   Final    NO GROWTH 3 DAYS Performed at [facility], [address], [place], [place] [address]    Report Status PENDING  Incomplete  Culture, blood (x 2)     Status: None (Preliminary result)   Collection Time: 05/23/23  3:06 AM   Specimen: BLOOD RIGHT ARM  Result Value Ref Range Status   Specimen Description BLOOD RIGHT ARM  Final   Special Requests   Final    BOTTLES DRAWN AEROBIC AND ANAEROBIC Blood Culture results may not be optimal due to an excessive volume of blood received in culture bottles   Culture   Final    NO GROWTH 2 DAYS Performed at [facility], [address], [place], [place] [address]    Report Status PENDING  Incomplete  Culture, blood (x 2)     Status: None (Preliminary result)   Collection Time: 05/23/23  3:06 AM   Specimen: BLOOD RIGHT HAND  Result Value Ref Range Status   Specimen Description BLOOD RIGHT HAND  Final   Special Requests   Final    BOTTLES DRAWN AEROBIC AND ANAEROBIC Blood Culture adequate volume   Culture   Final    NO GROWTH 2 DAYS Performed at [facility], [address], [place], [place] [address]    Report Status PENDING  Incomplete  Urine Culture     Status: None   Collection Time: 05/23/23  5:13 AM   Specimen: Urine, Random  Result Value Ref Range Status   Specimen Description URINE, RANDOM  Final   Special Requests NONE Reflexed from [id number]  Final   Culture   Final    NO GROWTH Performed at [facility], [address], [place], [place] [address]    Report Status 05/25/2023 FINAL  Final    Radiology Studies:  No results found.  Scheduled Meds:    ALPRAZolam  0.25 mg Oral BID   aspirin EC  81 mg Oral Daily   calcium carbonate  625 mg Oral Daily   carbidopa-levodopa  2 tablet  Oral 2 times per day   And   carbidopa-levodopa  1.5 tablet Oral Daily   feeding supplement  237 mL Oral BID BM   folic acid  1 mg Oral Daily   [START ON 05/26/2023] levofloxacin  250 mg Oral QAC breakfast   lisinopril  2.5 mg Oral Daily   magnesium oxide  200 mg Oral Daily   mirtazapine  7.5 mg Oral QHS   multivitamin with minerals  1 tablet Oral Daily   polyethylene glycol  17 g Oral Daily   rOPINIRole  0.125 mg Oral QHS   thiamine  200 mg Oral Daily    Continuous Infusions:       LOS: 3 days     Marcellus Scott, MD,  FACP, FHM, SFHM, [facility], [facility]   [facility] Physician Advisor [place]      To contact the attending provider between 7A-7P or the covering provider during after hours 7P-7A, please log into the web site [email or web address] and access using universal [place] password for that web site. If you do not have the password, please call the hospital operator.  05/25/2023, 3:56 PM

## 2023-05-26 DIAGNOSIS — G9341 Metabolic encephalopathy: Secondary | ICD-10-CM | POA: Diagnosis not present

## 2023-05-26 DIAGNOSIS — R6520 Severe sepsis without septic shock: Secondary | ICD-10-CM | POA: Diagnosis not present

## 2023-05-26 DIAGNOSIS — A419 Sepsis, unspecified organism: Secondary | ICD-10-CM | POA: Diagnosis not present

## 2023-05-26 MED ORDER — BISACODYL 10 MG RE SUPP
10.0000 mg | Freq: Every day | RECTAL | Status: DC | PRN
  Administered 2023-05-26: 10 mg via RECTAL
  Filled 2023-05-26: qty 1

## 2023-05-26 NOTE — Hospital Course (Addendum)
Hannah Fisher is a 72 y.o. F with Parkinson's disease, incipient dementia, chronic Xanax use, HTN, folate and B12 deficiency who presented with fever, confusion, fall from [facility].  Found to have GNR bacteremia.

## 2023-05-26 NOTE — Plan of Care (Signed)

## 2023-05-26 NOTE — TOC Progression Note (Signed)
Transition of Care [facility]) - Progression Note    Patient Details  Name: Hannah Fisher MRN: [id number] Date of Birth: 1951-08-23  Transition of [facility]) CM/SW Contact  Dellie Burns [place], [place] Phone Number: 05/26/2023, 10:10 AM  Clinical Narrative:  voicemail left for pt's dtr [name] [phone] requesting return call re current SNF offers. Per MD, pt medically stable for dc pending SNF bed. Will provide updates as available.     Dellie Burns, MSW, LCSW [phone] (coverage)      Expected Discharge Plan: Skilled Nursing Facility Barriers to Discharge: Continued Medical Work up, English as a second language teacher, SNF Pending bed offer  Expected Discharge Plan and Services In-house Referral: Clinical Social Work     Living arrangements for the past 2 months: Independent Living Facility ([facility])                                       Social Determinants of Health (SDOH) Interventions SDOH Screenings   Food Insecurity: No Food Insecurity (05/23/2023)  Housing: Low Risk  (05/23/2023)  Transportation Needs: No Transportation Needs (05/23/2023)  Utilities: Not At Risk (05/23/2023)  Tobacco Use: Low Risk  (05/22/2023)    Readmission Risk Interventions     No data to display

## 2023-05-26 NOTE — Progress Notes (Signed)
  Progress Note   Patient: Hannah Fisher KVQ:[id number] DOB: 01-02-1951 DOA: 05/22/2023     4 DOS: the patient was seen and examined on 05/26/2023 at 8:47AM      Brief hospital course: Hannah Fisher is a 72 y.o. F with Parkinson's disease, incipient dementia, chronic Xanax use, HTN, folate and B12 deficiency who presented with fever, fall from [facility].  Found to have GNR bacteremia.     Assessment and Plan: E. coli bacteremia, sepsis from right-sided pyelonephritis See summary from 8/23 Dr. Waymon Amato discussed with ID who recommended Levaquin with end date 8/27 - Continue Levaquin day 4 of 7    Acute metabolic encephalopathy superimposed on cognitive impairment No diagnosis of dementia in last Neurology note but they mention [facility] 17/30 and last several notes mention that even family observe memory impairment.  No mention of tapering/stopping Xanax.  Here, patient is confused, forgetful and tangential, although oriented to self, place and cooperative with instructions. - Standard delirium precautions: blinds open and lights on during day, TV off, minimize interruptions at night, glasses/hearing aids, PT/OT, avoiding Beers list medications     Anxiety Xanax is relatively contraindicated in this patient, although previous provider discussed with family who have asked that it be continued. - Continue low dose Xanax BID - Recommend review with prescriber for taper   Normocytic anemia Hgb stable, no clinical bleeding   Hypertension - Continue aspirin, lisinjopril   Parkinson's disease - Continue sinemet, ropinirole - Continue mirtazapine   Hypokalemia  Hypophosphatemia Hyponatremia Supplemented and resolved  Folate deficiency - Continue folate  Underweight BMI 16        Subjective: Patient concerned she is not getting her antibiotics.  No headache, chest pain, dyspnea, fever.     Physical Exam: BP 138/85 (BP Location: Right Arm)   Pulse 94    Temp 98.5 F (36.9 C) (Oral)   Resp 18   Wt 39 kg   LMP 10/02/1990   SpO2 98%   BMI 16.25 kg/m   Small adult female, lying in bed, makes eye contact and responds to questions but most of what she is saying does not make sense RRR, no murmurs, no peripheral edema Respiratory rate normal, lungs clear without rales or wheezes Abdomen soft no tenderness palpation or guarding Attentive to questions, makes eye contact, face symmetric, speech fluent, generalized weakness but symmetric strength, oriented to self and place but mostly unintelligible and rambling    Data Reviewed: Basic metabolic panel shows mild hyponatremia yesterday CBC normal 2 days ago  Family Communication: None today    Disposition: Status is: Inpatient The patient has been medically ready for discharge since 8/22, pending SNF bed availability        Author: Alberteen Sam, MD 05/26/2023 2:07 PM  For on call review [email or web address].

## 2023-05-27 DIAGNOSIS — A419 Sepsis, unspecified organism: Secondary | ICD-10-CM | POA: Diagnosis not present

## 2023-05-27 DIAGNOSIS — G9341 Metabolic encephalopathy: Secondary | ICD-10-CM | POA: Diagnosis not present

## 2023-05-27 DIAGNOSIS — E871 Hypo-osmolality and hyponatremia: Secondary | ICD-10-CM | POA: Insufficient documentation

## 2023-05-27 DIAGNOSIS — D649 Anemia, unspecified: Secondary | ICD-10-CM | POA: Insufficient documentation

## 2023-05-27 DIAGNOSIS — R6520 Severe sepsis without septic shock: Secondary | ICD-10-CM | POA: Diagnosis not present

## 2023-05-27 DIAGNOSIS — E538 Deficiency of other specified B group vitamins: Secondary | ICD-10-CM | POA: Insufficient documentation

## 2023-05-27 LAB — CULTURE, BLOOD (ROUTINE X 2)
Culture: NO GROWTH
Special Requests: ADEQUATE

## 2023-05-27 NOTE — Assessment & Plan Note (Signed)
Presented with fever, tachycardia, encephalopathy and found to have CT findings of pyelonephritis and blood culture growing E coli.  Treated with Rocephin and transitioned to Levaquin after discussion with ID.  Resolving - Continue Levaquin, end date 8/27

## 2023-05-27 NOTE — TOC Progression Note (Signed)
Transition of Care [facility]) - Progression Note    Patient Details  Name: MARESA IRISH MRN: [id number] Date of Birth: January 05, 1951  Transition of Care [facility]) CM/SW Contact  Dellie Burns [place], [place] Phone Number: 05/27/2023, 1:18 PM  Clinical Narrative:   left dtr [name] another voicemail requesting return call re SNF choice. Call goes straight to voicemail. Will provide updates as available.   Dellie Burns, MSW, LCSW [phone] (coverage)      Expected Discharge Plan: Skilled Nursing Facility Barriers to Discharge: Continued Medical Work up, English as a second language teacher, SNF Pending bed offer  Expected Discharge Plan and Services In-house Referral: Clinical Social Work     Living arrangements for the past 2 months: Independent Living Facility ([facility])                                       Social Determinants of Health (SDOH) Interventions SDOH Screenings   Food Insecurity: No Food Insecurity (05/23/2023)  Housing: Low Risk  (05/23/2023)  Transportation Needs: No Transportation Needs (05/23/2023)  Utilities: Not At Risk (05/23/2023)  Tobacco Use: Low Risk  (05/22/2023)    Readmission Risk Interventions     No data to display

## 2023-05-27 NOTE — Progress Notes (Signed)
  Progress Note   Patient: Hannah Fisher WJX:[id number] DOB: 1950/10/03 DOA: 05/22/2023     5 DOS: the patient was seen and examined on 05/27/2023 at 10:07AM      Brief hospital course: Hannah Fisher is a 72 y.o. F with Parkinson's disease, incipient dementia, chronic Xanax use, HTN, folate and B12 deficiency who presented with fever, confusion, fall from [facility].  Found to have GNR bacteremia.     Assessment and Plan: * E. coli bacteremia, sepsis from right-sided pyelonephritis Presented with fever, tachycardia, encephalopathy and found to have CT findings of pyelonephritis and blood culture growing E coli.  Treated with Rocephin and transitioned to Levaquin after discussion with ID.  Resolving - Continue Levaquin, end date 8/27   Principal Problem:   E. coli bacteremia, sepsis from right-sided pyelonephritis Active Problems:   Acute metabolic encephalopathy superimposed on cognitive impairment   Hypertension   Anxiety   Parkinson disease   Hypokalemia   Normocytic anemia   Folate deficiency   Hypophosphatemia   Hyponatremia  - Continue home Xanax, mirtazapine - Continue aspirin, lisinopril - Continue Sinemet, ropinirole - Continue thiamine, folate        Subjective: No new complaints, wondering when she will get out of the hospital.  No fever, no new nursing concerns.     Physical Exam: BP 115/78 (BP Location: Right Arm)   Pulse 88   Temp 97.6 F (36.4 C) (Oral)   Resp 16   Wt 39 kg   LMP 10/02/1990   SpO2 98%   BMI 16.25 kg/m   Thin elderly female, lying in bed, interactive and appropriate. RRR, no murmurs, no peripheral edema Respiratory rate normal, lungs clear without rales or wheezes Abdomen soft, no tenderness palpation or guarding Attentive and to questions, seems confused, has generalized weakness  Data Reviewed: None   Family Communication: None present    Disposition: Status is: Inpatient Patient is medically  stabilized for discharge since 8/22, pending SNF bed availability        Author: Alberteen Sam, MD 05/27/2023 11:35 AM  For on call review [email or web address].

## 2023-05-28 DIAGNOSIS — R6520 Severe sepsis without septic shock: Secondary | ICD-10-CM | POA: Diagnosis not present

## 2023-05-28 DIAGNOSIS — A419 Sepsis, unspecified organism: Secondary | ICD-10-CM | POA: Diagnosis not present

## 2023-05-28 DIAGNOSIS — G9341 Metabolic encephalopathy: Secondary | ICD-10-CM | POA: Diagnosis not present

## 2023-05-28 LAB — CULTURE, BLOOD (ROUTINE X 2)
Culture: NO GROWTH
Culture: NO GROWTH
Special Requests: ADEQUATE

## 2023-05-28 NOTE — Progress Notes (Signed)
  Progress Note   Patient: Hannah Fisher ZOX:[id number] DOB: [birth year] DOA: 05/22/2023     6 DOS: the patient was seen and examined on 05/28/2023 at 9:00AM      Brief hospital course: Mrs. Arms is a 72 y.o. F with Parkinson's disease, incipient dementia, chronic Xanax use, HTN, folate and B12 deficiency who presented with fever, confusion, fall from [facility].  Found to have GNR bacteremia.     Assessment and Plan: * E. coli bacteremia, sepsis from right-sided pyelonephritis Presented with fever, tachycardia, encephalopathy and found to have CT findings of pyelonephritis and blood culture growing E coli.  Treated with Rocephin and transitioned to Levaquin after discussion with ID.  Resolving - Continue Levaquin, end date 8/27   Principal Problem:   E. coli bacteremia, sepsis from right-sided pyelonephritis Active Problems:   Acute metabolic encephalopathy superimposed on cognitive impairment   Hypertension   Anxiety   Parkinson disease   Hypokalemia   Normocytic anemia   Folate deficiency   Hypophosphatemia   Hyponatremia  - Continue aspirin, Sinemet, folate, lisinopril, mirtazapine, Requip, thiamine, Xanax per home doses       Subjective: Patient has no new complaints.  Little nausea this morning but ate breakfast.  No fever, no respiratory symptoms, no nursing concerns.     Physical Exam: BP (!) 141/88 (BP Location: Right Arm)   Pulse 92   Temp 97.7 F (36.5 C) (Oral)   Resp 18   Ht 5\' 1"  (1.549 m)   Wt 39 kg   LMP 10/02/1990   SpO2 98%   BMI 16.25 kg/m   Thin adult female, lying in bed, interactive, appears tired RRR, no murmurs, no peripheral edema Respiratory rate normal, lungs clear without rales or wheezes Abdomen soft without tenderness palpation or guarding Responds to questions, makes eye contact, appears to have memory impairment, appears generally weak    Data Reviewed: Repeat labs on 8/30      Disposition: Status  is: Inpatient         Author: Alberteen Sam, MD 05/28/2023 2:18 PM  For on call review [email or web address].

## 2023-05-28 NOTE — Care Management Important Message (Signed)
Important Message  Patient Details  Name: Hannah Fisher MRN: [id number] Date of Birth: September 08, 1951   Medicare Important Message Given:  Yes     Takiya Belmares 05/28/2023, 3:12 PM

## 2023-05-28 NOTE — TOC Progression Note (Signed)
Transition of Care [facility]) - Progression Note    Patient Details  Name: Hannah Fisher MRN: [id number] Date of Birth: [birth year]  Transition of Care [facility]) CM/SW Contact  Aili Casillas [place], [place] Phone Number: 05/28/2023, 11:50 AM  Clinical Narrative:     CSW reached out to [facility] regarding pt's balance at facility. CSW updated daughter, [name], with the balance and notice that pt can go to facility if balance is paid. Pt's daughter, [name] is informed and will follow up with [facility] regarding decision for payment. Star to reach out to CSW if pt has been accepted at facility.   TOC will continue to follow.   Expected Discharge Plan: Skilled Nursing Facility Barriers to Discharge: Continued Medical Work up, English as a second language teacher, SNF Pending bed offer  Expected Discharge Plan and Services In-house Referral: Clinical Social Work     Living arrangements for the past 2 months: Independent Living Facility ([facility])                                       Social Determinants of Health (SDOH) Interventions SDOH Screenings   Food Insecurity: No Food Insecurity (05/23/2023)  Housing: Low Risk  (05/23/2023)  Transportation Needs: No Transportation Needs (05/23/2023)  Utilities: Not At Risk (05/23/2023)  Tobacco Use: Low Risk  (05/22/2023)    Readmission Risk Interventions     No data to display

## 2023-05-29 DIAGNOSIS — R6520 Severe sepsis without septic shock: Secondary | ICD-10-CM | POA: Diagnosis not present

## 2023-05-29 DIAGNOSIS — A419 Sepsis, unspecified organism: Secondary | ICD-10-CM | POA: Diagnosis not present

## 2023-05-29 DIAGNOSIS — G9341 Metabolic encephalopathy: Secondary | ICD-10-CM | POA: Diagnosis not present

## 2023-05-29 NOTE — Progress Notes (Signed)
  Progress Note   Patient: Hannah Fisher ZOX:[id number] DOB: [birth year] DOA: 05/22/2023     7 DOS: the patient was seen and examined on 05/29/2023 at 8:35AM      Brief hospital course: Hannah Fisher is a 72 y.o. F with Parkinson's disease, incipient dementia, chronic Xanax use, HTN, folate and B12 deficiency who presented with fever, confusion, fall from [facility].  Found to have GNR bacteremia.     Assessment and Plan: * E. coli bacteremia, sepsis from right-sided pyelonephritis Presented with fever, tachycardia, encephalopathy and found to have CT findings of pyelonephritis and blood culture growing E coli.  Treated with Rocephin and transitioned to Levaquin after discussion with ID.  Resolving - Continue Levaquin, day 7 of 7    Acute metabolic encephalopathy superimposed on cognitive impairment No diagnosis of dementia in last Neurology note but they mention [facility] 17/30 and last several notes mention that even family observe memory impairment.  No mention of tapering/stopping Xanax.   Here, patient is confused, forgetful and tangential, although oriented to self, place and cooperative with instructions. - Standard delirium precautions: blinds open and lights on during day, TV off, minimize interruptions at night, glasses/hearing aids, PT/OT, avoiding Beers list medications       Anxiety Xanax is relatively contraindicated in this patient, although previous provider discussed with family who have asked that it be continued. - Continue low dose Xanax BID - Recommend review with prescriber for taper     Normocytic anemia Hgb stable, no clinical bleeding    Hypertension - Continue aspirin, lisinjopril   Parkinson's disease - Continue sinemet, ropinirole - Continue mirtazapine   Hypokalemia  Hypophosphatemia Hyponatremia Supplemented and resolved   Folate deficiency - Continue folate   Underweight BMI 16           Subjective: patient has no  complaints.  No new fever, no nausea or flank pain.  No nursing concerns     Physical Exam: BP (!) 143/92 (BP Location: Right Arm)   Pulse 88   Temp 98.4 F (36.9 C) (Oral)   Resp 18   Ht 5\' 1"  (1.549 m)   Wt 39 kg   LMP 10/02/1990   SpO2 100%   BMI 16.25 kg/m   Thin elderly female, lying in bed, interactive but somewhat disorganized in her thoughts RRR, no murmurs, no peripheral edema Respiratory rate normal, lungs clear without rales or wheezes Abdomen soft no tenderness to palpation or guarding   Data Reviewed:   Family Communication:     Disposition: Status is: Inpatient The patient remains medically optimized for discharge to the next level of care, TOC have had difficulty hearing back from family for SNF options        Author: Alberteen Sam, MD 05/29/2023 2:11 PM  For on call review [email or web address].

## 2023-05-29 NOTE — Plan of Care (Signed)
  Problem: Activity: Goal: Risk for activity intolerance will decrease Outcome: Progressing   Problem: Nutrition: Goal: Adequate nutrition will be maintained Outcome: Progressing   Problem: Coping: Goal: Level of anxiety will decrease Outcome: Progressing   Problem: Elimination: Goal: Will not experience complications related to bowel motility Outcome: Progressing   Problem: Pain Managment: Goal: General experience of comfort will improve Outcome: Progressing   

## 2023-05-29 NOTE — TOC Progression Note (Addendum)
Transition of Care [facility]) - Progression Note    Patient Details  Name: Hannah Fisher MRN: [id number] Date of Birth: Feb 03, 1951  Transition of Care [facility]) CM/SW Contact  Raine Blodgett A [place], [place] Phone Number: 05/29/2023, 10:58 AM  Clinical Narrative:     Update 1418 CSW received call from [name], she said that she worked out payment situation with business office and that pt can go to [facility].  CSW to start auth. CSW was informed by liaison that be is available tomorrow at facility.   TOC will continue to follow.   [facility] said that [name] had not reached out facility with [place] on payment.   CSW reached out to pt's daughter [name], regarding bed offer decision.  CSW left voicemail with contact information to reach back out to CSW with decision so pt can DC as she is medically stable.   TOC will continue to follow.    Expected Discharge Plan: Skilled Nursing Facility Barriers to Discharge: Continued Medical Work up, English as a second language teacher, SNF Pending bed offer  Expected Discharge Plan and Services In-house Referral: Clinical Social Work     Living arrangements for the past 2 months: Independent Living Facility ([facility])                                       Social Determinants of Health (SDOH) Interventions SDOH Screenings   Food Insecurity: No Food Insecurity (05/23/2023)  Housing: Low Risk  (05/23/2023)  Transportation Needs: No Transportation Needs (05/23/2023)  Utilities: Not At Risk (05/23/2023)  Tobacco Use: Low Risk  (05/22/2023)    Readmission Risk Interventions     No data to display

## 2023-05-29 NOTE — Progress Notes (Signed)
Physical Therapy Treatment Patient Details Name: Hannah Fisher MRN: [id number] DOB: [birth year] Today's Date: 05/29/2023   History of Present Illness Pt is a 72 y/o female presenting with frequent falls, progressive weakness, fever and confusion in setting of E. Coli bacteremia/sepsis from R acute pyelonephritis and acute on chronic metabolic encephalopathy. PMH: Parkinson's disease, HTN, cognitive impairment.    PT Comments  Pt greeted in bed calling out for help as pt with urine incontinence and unable to problem solve to utilize call bell system. Pt able to come to sitting EOB with min A with pt declining attempts without HHA despite max encouragement. Pt able to rise to stand from EOB and low commode with min A this session and perform single leg stance R and L with RW support to donn brief at end of session. Pt continues to demonstrate festinating gait intermittently requiring min A for RW management and for balance support. Pt with noted confusion throughout session asking when her parents were coming. Pt continues to benefit from skilled PT services to progress toward functional mobility goals.      If plan is discharge home, recommend the following: A lot of help with walking and/or transfers;A lot of help with bathing/dressing/bathroom;Direct supervision/assist for medications management;Direct supervision/assist for financial management;Assistance with cooking/housework   Can travel by private vehicle     No  Equipment Recommendations  Other (comment) (TBD)    Recommendations for Other Services       Precautions / Restrictions Precautions Precautions: Fall Restrictions Weight Bearing Restrictions: No     Mobility  Bed Mobility Overal bed mobility: Needs Assistance Bed Mobility: Supine to Sit     Supine to sit: Mod assist, HOB elevated, Used rails Sit to supine: Min assist   General bed mobility comments: Assist to bring legs off of bed, elevate trunk into sitting and  bring hips to EOB, min A to return LEs to bed, encouraged pt to compelte without assist however pt stating she cannot and would not attempt without HHA    Transfers Overall transfer level: Needs assistance Equipment used: Rolling walker (2 wheels) Transfers: Sit to/from Stand, Bed to chair/wheelchair/BSC Sit to Stand: Min assist           General transfer comment: min A to stand from EOB and low commode    Ambulation/Gait Ambulation/Gait assistance: Min assist Gait Distance (Feet): 20 Feet (x2) Assistive device: Rolling walker (2 wheels) Gait Pattern/deviations: Decreased step length - right, Decreased step length - left, Knee flexed in stance - right, Knee flexed in stance - left, Shuffle, Trunk flexed, Narrow base of support, Step-through pattern, Festinating Gait velocity: variable     General Gait Details: Assist for balance and support, pt initally able to keep heels on ground, however as pt approaching EOB at end of gait rising on toes and demonstrating festinating gait   Stairs             Wheelchair Mobility     Tilt Bed    Modified Rankin (Stroke Patients Only)       Balance Overall balance assessment: Needs assistance Sitting-balance support: Feet supported, No upper extremity supported Sitting balance-Leahy Scale: Fair     Standing balance support: Bilateral upper extremity supported, During functional activity Standing balance-Leahy Scale: Poor Standing balance comment: RW support needed for static standing                            Cognition Arousal: Alert  Behavior During Therapy: WFL for tasks assessed/performed Overall Cognitive Status: History of cognitive impairments - at baseline                                 General Comments: hx of Parkinson's, poor safety awareness and awareness of deficits, talkative but tangential and nonsensical intermittently, asking for clothes throughout and about when her parents were  coming, pt calling out for help in hall as pt incontient of urine, not using call bell        Exercises      General Comments General comments (skin integrity, edema, etc.): VSS on RA, pt with incontinence of urine on arrival , calling out into the hall for help      Pertinent Vitals/Pain Pain Assessment Pain Assessment: No/denies pain Faces Pain Scale: Hurts a little bit Pain Location: knees Pain Descriptors / Indicators: Aching, Discomfort Pain Intervention(s): Monitored during session, Limited activity within patient's tolerance    Home Living                          Prior Function            PT Goals (current goals can now be found in the care plan section) Acute Rehab PT Goals PT Goal Formulation: Patient unable to participate in goal setting Time For Goal Achievement: 06/06/23 Progress towards PT goals: Progressing toward goals    Frequency    Min 1X/week      PT Plan      Co-evaluation              AM-PAC PT "6 Clicks" Mobility   Outcome Measure  Help needed turning from your back to your side while in a flat bed without using bedrails?: A Little Help needed moving from lying on your back to sitting on the side of a flat bed without using bedrails?: A Little Help needed moving to and from a bed to a chair (including a wheelchair)?: A Little Help needed standing up from a chair using your arms (e.g., wheelchair or bedside chair)?: A Little Help needed to walk in hospital room?: A Lot Help needed climbing 3-5 steps with a railing? : Total 6 Click Score: 15    End of Session Equipment Utilized During Treatment: Gait belt Activity Tolerance: Patient tolerated treatment well Patient left: with call bell/phone within reach;in bed;with bed alarm set Nurse Communication: Mobility status PT Visit Diagnosis: Unsteadiness on feet (R26.81);Muscle weakness (generalized) (M62.81)     Time: 1610-9604 PT Time Calculation (min) (ACUTE ONLY): 22  min  Charges:    $Therapeutic Activity: 8-22 mins PT General Charges $$ ACUTE PT VISIT: 1 Visit                     Hannah Fisher R. PTA Acute Rehabilitation Services Office: [phone]   Catalina Antigua 05/29/2023, 4:40 PM

## 2023-05-29 NOTE — Progress Notes (Signed)
Nutrition Follow-up  DOCUMENTATION CODES:   Underweight  INTERVENTION:  - Continue with Regular diet.   - Add Ensure Enlive po BID, each supplement provides 350 kcal and 20 grams of protein.  NUTRITION DIAGNOSIS:   Inadequate oral intake related to poor appetite, lethargy/confusion as evidenced by meal completion < 50%.  GOAL:   Patient will meet greater than or equal to 90% of their needs - Improving.   MONITOR:   PO intake, Supplement acceptance  REASON FOR ASSESSMENT:   Consult Assessment of nutrition requirement/status  ASSESSMENT:   72 y.o. female admits related to weakness and AMS. PMH includes: Parkinson, deafness, HTN, cognitive dysfunction. Pt is currently receiving medical management related to suspected E.Coli bacteremia and sepsis from right-sided pyelonephritis.  Meds reviewed: calcium carbonate, folic acid, mag-ox, remeron, MVI, miralax, thiamine. Labs reviewed: Na low.   Pt was sleeping at time of assessment. Pt remains oriented x1. Pt ate 50-100% of her meals today. RN reports that the pt declined her Ensure this am but RN is going to offer again this afternoon. Per meds hx record, pt has been accepting Ensure shakes BID. Per record, pt has been eating 50-100% of her meals over the past 7 days. Pt is likely meeting her needs at this time.   Diet Order:   Diet Order             Diet regular Room service appropriate? Yes with Assist; Fluid consistency: Thin  Diet effective now                   EDUCATION NEEDS:   Not appropriate for education at this time  Skin:  Skin Assessment: Reviewed RN Assessment  Last BM:  8/26 - type 2  Height:   Ht Readings from Last 1 Encounters:  05/28/23 5\' 1"  (1.549 m)    Weight:   Wt Readings from Last 1 Encounters:  05/26/23 39 kg    Ideal Body Weight:     BMI:  Body mass index is 16.25 kg/m.  Estimated Nutritional Needs:   Kcal:  1300-1600 kcals  Protein:  65-80 gm  Fluid:  >/= 1.3  L  Bethann Humble, RD, LDN, CNSC.

## 2023-05-29 NOTE — Progress Notes (Signed)
Physical Therapy Treatment Patient Details Name: Hannah Fisher MRN: [id number] DOB: 10/12/1950 Today's Date: 05/29/2023   History of Present Illness Pt is a 72 y/o female presenting with frequent falls, progressive weakness, fever and confusion in setting of E. Coli bacteremia/sepsis from R acute pyelonephritis and acute on chronic metabolic encephalopathy. PMH: Parkinson's disease, HTN, cognitive impairment.    PT Comments  Pt greeted resting in bed and agreeable to session with continued progress towards acute goals. Pt exhibiting some self-limiting behavior, asking for assist and stating "I need help to do that" before attempting, however pt participatory throughout and eager to progress mobility this session. Pt needing mod A to come to sitting EOB and grossly min A for functional transfers and gait with RW support. Pt with improved posture this session in standing, without posterior bias, however pt continues to toe walk with increased gait distance and speed with minimal improvement with cues. Pt needing max cues throughout session for sequencing, safety and navigation as pt continues to be most limited by impaired cognition, decreased insight into deficits, poor short term memory and poor balance/postural reactions and decreased activity tolerance. Current recommendation remains appropriate. Pt continues to benefit from skilled PT services to progress toward functional mobility goals.     If plan is discharge home, recommend the following: A lot of help with walking and/or transfers;A lot of help with bathing/dressing/bathroom;Direct supervision/assist for medications management;Direct supervision/assist for financial management;Assistance with cooking/housework   Can travel by private vehicle     No  Equipment Recommendations  Other (comment) (TBD)    Recommendations for Other Services       Precautions / Restrictions Precautions Precautions: Fall Restrictions Weight Bearing  Restrictions: No     Mobility  Bed Mobility Overal bed mobility: Needs Assistance Bed Mobility: Supine to Sit     Supine to sit: Mod assist, HOB elevated, Used rails Sit to supine: Min assist   General bed mobility comments: Assist to bring legs off of bed, elevate trunk into sitting and bring hips to EOB, min A to return LEs to bed    Transfers Overall transfer level: Needs assistance Equipment used: Rolling walker (2 wheels) Transfers: Sit to/from Stand, Bed to chair/wheelchair/BSC Sit to Stand: Mod assist, Min assist           General transfer comment: min A to stand from EOB with pt demosntrating improved posture, mod A from low commode    Ambulation/Gait Ambulation/Gait assistance: Min assist Gait Distance (Feet): 72 Feet (15 + 57) Assistive device: Rolling walker (2 wheels) Gait Pattern/deviations: Decreased step length - right, Decreased step length - left, Knee flexed in stance - right, Knee flexed in stance - left, Shuffle, Trunk flexed, Narrow base of support, Step-through pattern Gait velocity: variable     General Gait Details: Assist for balance and support, pt initally able to keep heels on ground, however with increased distance pt increasing speed and walking on toes, cues to slow gait and focus on heel contact with minimal improvement   Stairs             Wheelchair Mobility     Tilt Bed    Modified Rankin (Stroke Patients Only)       Balance Overall balance assessment: Needs assistance Sitting-balance support: Feet supported, No upper extremity supported Sitting balance-Leahy Scale: Fair     Standing balance support: Bilateral upper extremity supported, During functional activity Standing balance-Leahy Scale: Poor Standing balance comment: RW support needed for static standing  Cognition Arousal: Alert Behavior During Therapy: WFL for tasks assessed/performed Overall Cognitive Status: History  of cognitive impairments - at baseline                                 General Comments: hx of Parkinson's, poor safety awareness and awareness of deficits, talkative but tangential and nonsensical intermittently        Exercises      General Comments General comments (skin integrity, edema, etc.): VSS on RA, pt verbose throughout session however tangential and some nonsensical topics/words      Pertinent Vitals/Pain Pain Assessment Pain Assessment: Faces Faces Pain Scale: Hurts a little bit Pain Location: knees Pain Descriptors / Indicators: Aching, Discomfort Pain Intervention(s): Monitored during session, Limited activity within patient's tolerance, Repositioned    Home Living                          Prior Function            PT Goals (current goals can now be found in the care plan section) Acute Rehab PT Goals PT Goal Formulation: Patient unable to participate in goal setting Time For Goal Achievement: 06/06/23 Progress towards PT goals: Progressing toward goals    Frequency    Min 1X/week      PT Plan      Co-evaluation              AM-PAC PT "6 Clicks" Mobility   Outcome Measure  Help needed turning from your back to your side while in a flat bed without using bedrails?: A Little Help needed moving from lying on your back to sitting on the side of a flat bed without using bedrails?: A Little Help needed moving to and from a bed to a chair (including a wheelchair)?: A Little Help needed standing up from a chair using your arms (e.g., wheelchair or bedside chair)?: A Little Help needed to walk in hospital room?: A Lot Help needed climbing 3-5 steps with a railing? : Total 6 Click Score: 15    End of Session Equipment Utilized During Treatment: Gait belt Activity Tolerance: Patient tolerated treatment well Patient left: with call bell/phone within reach;in bed;with bed alarm set Nurse Communication: Mobility status PT  Visit Diagnosis: Unsteadiness on feet (R26.81);Muscle weakness (generalized) (M62.81)     Time: 5638-7564 PT Time Calculation (min) (ACUTE ONLY): 27 min  Charges:    $Gait Training: 8-22 mins $Therapeutic Activity: 8-22 mins PT General Charges $$ ACUTE PT VISIT: 1 Visit                     Jisselle Poth R. PTA Acute Rehabilitation Services Office: [phone]   Catalina Antigua 05/29/2023, 10:06 AM

## 2023-05-30 DIAGNOSIS — A419 Sepsis, unspecified organism: Secondary | ICD-10-CM | POA: Diagnosis not present

## 2023-05-30 DIAGNOSIS — G9341 Metabolic encephalopathy: Secondary | ICD-10-CM | POA: Diagnosis not present

## 2023-05-30 DIAGNOSIS — R6520 Severe sepsis without septic shock: Secondary | ICD-10-CM | POA: Diagnosis not present

## 2023-05-30 LAB — CBC
HCT: 35.6 % — ABNORMAL LOW (ref 36.0–46.0)
Hemoglobin: 11.4 g/dL — ABNORMAL LOW (ref 12.0–15.0)
MCH: 29.8 pg (ref 26.0–34.0)
MCHC: 32 g/dL (ref 30.0–36.0)
MCV: 93.2 fL (ref 80.0–100.0)
Platelets: 387 10*3/uL (ref 150–400)
RBC: 3.82 MIL/uL — ABNORMAL LOW (ref 3.87–5.11)
RDW: 12.5 % (ref 11.5–15.5)
WBC: 8.1 10*3/uL (ref 4.0–10.5)
nRBC: 0 % (ref 0.0–0.2)

## 2023-05-30 LAB — COMPREHENSIVE METABOLIC PANEL
ALT: 11 U/L (ref 0–44)
AST: 16 U/L (ref 15–41)
Albumin: 3.1 g/dL — ABNORMAL LOW (ref 3.5–5.0)
Alkaline Phosphatase: 76 U/L (ref 38–126)
Anion gap: 10 (ref 5–15)
BUN: 15 mg/dL (ref 8–23)
CO2: 22 mmol/L (ref 22–32)
Calcium: 9 mg/dL (ref 8.9–10.3)
Chloride: 107 mmol/L (ref 98–111)
Creatinine, Ser: 0.91 mg/dL (ref 0.44–1.00)
GFR, Estimated: >60 mL/min (ref >60)
Glucose, Bld: 99 mg/dL (ref 70–99)
Potassium: 4.3 mmol/L (ref 3.5–5.1)
Sodium: 139 mmol/L (ref 135–145)
Total Bilirubin: 0.2 mg/dL — ABNORMAL LOW (ref 0.3–1.2)
Total Protein: 5.8 g/dL — ABNORMAL LOW (ref 6.5–8.1)

## 2023-05-30 MED ORDER — CLONIDINE HCL 0.1 MG PO TABS
0.1000 mg | ORAL_TABLET | Freq: Two times a day (BID) | ORAL | Status: DC
  Administered 2023-05-31: 0.1 mg via ORAL
  Filled 2023-05-30: qty 1

## 2023-05-30 NOTE — TOC Progression Note (Addendum)
Transition of Care [facility]) - Progression Note    Patient Details  Name: Hannah Fisher MRN: [id number] Date of Birth: 09-29-1951  [facility]) CM/SW Contact  Afrika Brick A [place], [place] Phone Number: 05/30/2023, 9:08 AM  Clinical Narrative:     05/30/23 1505 Auth still pending for [facility].   __________________________  Berkley Harvey started for [facility] rehab. Bed available today. DC pending insurance authorization approval.     Expected Discharge Plan: Skilled Nursing Facility Barriers to Discharge: Continued Medical Work up, English as a second language teacher, SNF Pending bed offer  Expected Discharge Plan and Services In-house Referral: Clinical Social Work     Living arrangements for the past 2 months: Independent Living Facility ([facility])                                       Social Determinants of Health (SDOH) Interventions SDOH Screenings   Food Insecurity: No Food Insecurity (05/23/2023)  Housing: Low Risk  (05/23/2023)  Transportation Needs: No Transportation Needs (05/23/2023)  Utilities: Not At Risk (05/23/2023)  Tobacco Use: Low Risk  (05/22/2023)    Readmission Risk Interventions     No data to display

## 2023-05-30 NOTE — Progress Notes (Signed)
Hannah Fisher  UUV:[id number] DOB: [birth year] DOA: 05/22/2023 PCP: Daisy Floro, MD    Brief Narrative:  72 year old with a history of Parkinson's disease, incipient dementia, chronic Xanax use, HTN, and B12 and folic acid deficiencies who presented to the ER 8/20 with a fever confusion and following a fall at [facility] assisted living facility.  A full workup ultimately revealed gram-negative rod bacteremia related to pyelonephritis.  Goals of Care:   Code Status: Full Code   DVT prophylaxis: SCDs Start: 05/22/23 2338  Interim Hx: Remains medically stable awaiting SNF arrangement.  No new complaints today.  Remains pleasantly confused.  In no distress.  Assessment & Plan:  E. coli pyelonephritis with bacteremia CT confirmed findings of right sided pyelonephritis -blood cultures revealed E. Coli -to complete 7 days of antibiotic therapy, initial portion administered via IV  Acute metabolic encephalopathy due to pyelonephritis superimposed on chronic cognitive impairment Patient is intermittently confused forgetful and tangential though typically oriented to self and place and cooperative with instructions  Anxiety disorder Xanax is relatively contraindicated in this patient but after long discussion with the patient's family the decision has been made to continue it for now at a low dose  Chronic normocytic anemia Hemoglobin stable with no evidence of blood loss  HTN Continue lisinopril  Parkinson's disease Continue Sinemet ropinirole and mirtazapine  Hypokalemia -hypophosphatemia -hyponatremia All related to poor intake -resolved with supplementation/volume expansion  Chronic folate and B12 deficiency Continue folate and B12 supplementation  Underweight - Body mass index is 16.06 kg/m.  Family Communication: No family present at time of exam Disposition: Medically stable and awaiting SNF placement   Objective: Blood pressure (!) 168/115, pulse (!) 105,  temperature 97.7 F (36.5 C), resp. rate 18, height 5\' 1"  (1.549 m), weight 38.6 kg, last menstrual period 10/02/1990, SpO2 100%.  Intake/Output Summary (Last 24 hours) at 05/30/2023 1733 Last data filed at 05/30/2023 1700 Gross per 24 hour  Intake --  Output 5 ml  Net -5 ml   Filed Weights   05/25/23 0454 05/26/23 0504 05/30/23 0618  Weight: 39 kg 39 kg 38.6 kg    Examination: General: No acute respiratory distress Lungs: Clear to auscultation bilaterally without wheezes or crackles Cardiovascular: Regular rate and rhythm without murmur gallop or rub normal S1 and S2 Abdomen: Nontender, nondistended, soft, bowel sounds positive, no rebound, no ascites, no appreciable mass Extremities: No significant cyanosis, clubbing, or edema bilateral lower extremities  CBC: Recent Labs  Lab 05/24/23 0005 05/30/23 0615  WBC 5.3 8.1  HGB 11.1* 11.4*  HCT 34.3* 35.6*  MCV 93.0 93.2  PLT 169 387   Basic Metabolic Panel: Recent Labs  Lab 05/24/23 0005 05/24/23 1823 05/25/23 0854 05/30/23 0615  NA 136  --  133* 139  K 3.2*  --  3.8 4.3  CL 108  --  104 107  CO2 20*  --  20* 22  GLUCOSE 106*  --  96 99  BUN 14  --  8 15  CREATININE 0.76  --  0.58 0.91  CALCIUM 8.2*  --  8.1* 9.0  PHOS 1.6* 2.8 2.6  --    GFR: Estimated Creatinine Clearance: 33.6 mL/min (by C-G formula based on SCr of 0.91 mg/dL).   Scheduled Meds:  ALPRAZolam  0.25 mg Oral BID   aspirin EC  81 mg Oral Daily   calcium carbonate  625 mg Oral Daily   carbidopa-levodopa  2 tablet Oral 2 times per day   And  carbidopa-levodopa  1.5 tablet Oral Daily   feeding supplement  237 mL Oral BID BM   folic acid  1 mg Oral Daily   lisinopril  2.5 mg Oral Daily   magnesium oxide  200 mg Oral Daily   mirtazapine  7.5 mg Oral QHS   multivitamin with minerals  1 tablet Oral Daily   polyethylene glycol  17 g Oral Daily   rOPINIRole  0.125 mg Oral QHS   thiamine  200 mg Oral Daily     LOS: 8 days   Lonia Blood, MD [facility] Office  [phone] Pager - Text Page per Loretha Stapler  If 7PM-7AM, please contact night-coverage per Amion 05/30/2023, 5:33 PM

## 2023-05-30 NOTE — Plan of Care (Signed)
  Problem: Clinical Measurements: Goal: Signs and symptoms of infection will decrease Outcome: Progressing   Problem: Clinical Measurements: Goal: Will remain free from infection Outcome: Progressing   Problem: Activity: Goal: Risk for activity intolerance will decrease Outcome: Progressing   Problem: Nutrition: Goal: Adequate nutrition will be maintained Outcome: Progressing   Problem: Coping: Goal: Level of anxiety will decrease Outcome: Progressing

## 2023-05-30 NOTE — Progress Notes (Signed)
Occupational Therapy Treatment Patient Details Name: Hannah Fisher MRN: [id number] DOB: October 02, 1951 Today's Date: 05/30/2023   History of present illness Pt is a 72 y/o female presenting with frequent falls, progressive weakness, fever and confusion in setting of E. Coli bacteremia/sepsis from R acute pyelonephritis and acute on chronic metabolic encephalopathy. PMH: Parkinson's disease, HTN, cognitive impairment.   OT comments  Pt progressing toward established OT goals. Safety and carryover limited by cognition. Pt pleasantly confused requiring up to mod redirection at times. Mod cues for safety and constant cues for larger steps during session as well as safe use of RW in addition to need for mod A for balance support and assist to manage RW. Following one step commands with significantly increased time. Observed to benefit from multimodal cues to optimize movement quality regarding classic symptoms of PD such as small steps, poor posture, and decreased cognition. Patient will benefit from continued inpatient follow up therapy, <3 hours/day       If plan is discharge home, recommend the following:  A lot of help with walking and/or transfers;A lot of help with bathing/dressing/bathroom;Direct supervision/assist for medications management;Direct supervision/assist for financial management;Assistance with cooking/housework   Equipment Recommendations  None recommended by OT    Recommendations for Other Services      Precautions / Restrictions Precautions Precautions: Fall Restrictions Weight Bearing Restrictions: No       Mobility Bed Mobility Overal bed mobility: Needs Assistance Bed Mobility: Supine to Sit, Sit to Supine     Supine to sit: Min assist Sit to supine: Min assist   General bed mobility comments: Assist to bring BLE into bed on return to bed when first attempting to see pt and at end of session. Assist for truncal elevation OOB. Signficantly increased time.     Transfers Overall transfer level: Needs assistance Equipment used: Rolling walker (2 wheels) Transfers: Sit to/from Stand, Bed to chair/wheelchair/BSC Sit to Stand: Min assist     Step pivot transfers: Min assist, Mod assist     General transfer comment: MIn A for rise and for balance during transfer. Cnstant cues and hands on A for all pivotal steps during session with min-mod A due to need for cues and assist to balance as well as manage RW     Balance Overall balance assessment: Needs assistance Sitting-balance support: Feet supported, No upper extremity supported Sitting balance-Leahy Scale: Fair     Standing balance support: Bilateral upper extremity supported, During functional activity Standing balance-Leahy Scale: Poor                             ADL either performed or assessed with clinical judgement   ADL Overall ADL's : Needs assistance/impaired Eating/Feeding: Set up;Sitting Eating/Feeding Details (indicate cue type and reason): bed level with OT facilitating ergonomical positioning Grooming: Minimal assistance;Standing;Wash/dry hands;Brushing hair Grooming Details (indicate cue type and reason): Assist for balance and cues for safety             Lower Body Dressing: Total assistance Lower Body Dressing Details (indicate cue type and reason): NT donned new brief Toilet Transfer: Minimal assistance;Stand-pivot;Rolling walker (2 wheels);Moderate assistance Toilet Transfer Details (indicate cue type and reason): constant cues for taking larger steps. Notably difficulty advancing LLE to the left during SPT and forward during gait as if to drag foot Toileting- Clothing Manipulation and Hygiene: Minimal assistance;Sit to/from stand Toileting - Clothing Manipulation Details (indicate cue type and reason): for balance, Cues  for thoroughness     Functional mobility during ADLs: Minimal assistance;Rolling walker (2 wheels);Cueing for sequencing;Cueing for  safety General ADL Comments: Constant hands on assist and cues for safety. Assist for balance and RW position/management as pt pushing signficantly further out in front of her than needed and with anterior lean into walker affecting balance. LLE foot slightly dragging and body oriented to the R within the walker    Extremity/Trunk Assessment Upper Extremity Assessment Upper Extremity Assessment: Generalized weakness   Lower Extremity Assessment Lower Extremity Assessment: Defer to PT evaluation        Vision   Vision Assessment?: No apparent visual deficits   Perception     Praxis      Cognition Arousal: Alert Behavior During Therapy: WFL for tasks assessed/performed Overall Cognitive Status: History of cognitive impairments - at baseline                                 General Comments: hx of Parkinson's, poor safety awareness and awareness of deficits. Requiring constant cues to take larger steps and keep RW near her. Also observed to stand within RW but to the R. Poor receptiveness of verbal feedback but benefitted from tactile cues and min A.  talkative but tangential and nonsensical intermittently. Pt with fear of incontinence throughout session.        Exercises      Shoulder Instructions       General Comments VSS on RA. Decreased activity tolerance noted    Pertinent Vitals/ Pain       Pain Assessment Pain Assessment: No/denies pain  Home Living                                          Prior Functioning/Environment              Frequency  Min 1X/week        Progress Toward Goals  OT Goals(current goals can now be found in the care plan section)  Progress towards OT goals: Progressing toward goals  Acute Rehab OT Goals Patient Stated Goal: get to restroom OT Goal Formulation: With patient/family Time For Goal Achievement: 06/13/23 Potential to Achieve Goals: Good ADL Goals Pt Will Perform Lower Body Bathing:  with min assist;sitting/lateral leans;sit to/from stand Pt Will Perform Lower Body Dressing: with min assist;sit to/from stand;sitting/lateral leans Pt Will Transfer to Toilet: with contact guard assist;ambulating Pt Will Perform Toileting - Clothing Manipulation and hygiene: with min assist;sit to/from stand;sitting/lateral leans  Plan      Co-evaluation                 AM-PAC OT "6 Clicks" Daily Activity     Outcome Measure   Help from another person eating meals?: None Help from another person taking care of personal grooming?: A Little Help from another person toileting, which includes using toliet, bedpan, or urinal?: A Lot Help from another person bathing (including washing, rinsing, drying)?: A Lot Help from another person to put on and taking off regular upper body clothing?: A Little Help from another person to put on and taking off regular lower body clothing?: A Lot 6 Click Score: 16    End of Session Equipment Utilized During Treatment: Gait belt;Rolling walker (2 wheels)  OT Visit Diagnosis: Unsteadiness on feet (R26.81);Other abnormalities of gait and  mobility (R26.89);Muscle weakness (generalized) (M62.81)   Activity Tolerance Patient tolerated treatment well   Patient Left in bed;with call bell/phone within reach;with bed alarm set   Nurse Communication Mobility status        Time: 3086-5784 OT Time Calculation (min): 24 min  Charges: OT General Charges $OT Visit: 1 Visit OT Treatments $Self Care/Home Management : 23-37 mins  Tyler Deis, OTR/L [facility] Acute Rehabilitation Office: [phone]   Hannah Fisher 05/30/2023, 5:42 PM

## 2023-05-31 DIAGNOSIS — A419 Sepsis, unspecified organism: Secondary | ICD-10-CM | POA: Diagnosis not present

## 2023-05-31 DIAGNOSIS — Z7401 Bed confinement status: Secondary | ICD-10-CM | POA: Diagnosis not present

## 2023-05-31 DIAGNOSIS — E639 Nutritional deficiency, unspecified: Secondary | ICD-10-CM | POA: Diagnosis not present

## 2023-05-31 DIAGNOSIS — R2689 Other abnormalities of gait and mobility: Secondary | ICD-10-CM | POA: Diagnosis not present

## 2023-05-31 DIAGNOSIS — Z87448 Personal history of other diseases of urinary system: Secondary | ICD-10-CM | POA: Diagnosis not present

## 2023-05-31 DIAGNOSIS — R296 Repeated falls: Secondary | ICD-10-CM | POA: Diagnosis not present

## 2023-05-31 DIAGNOSIS — I10 Essential (primary) hypertension: Secondary | ICD-10-CM | POA: Diagnosis not present

## 2023-05-31 DIAGNOSIS — N10 Acute tubulo-interstitial nephritis: Secondary | ICD-10-CM | POA: Diagnosis not present

## 2023-05-31 DIAGNOSIS — R2681 Unsteadiness on feet: Secondary | ICD-10-CM | POA: Diagnosis not present

## 2023-05-31 DIAGNOSIS — M25562 Pain in left knee: Secondary | ICD-10-CM | POA: Diagnosis not present

## 2023-05-31 DIAGNOSIS — G9341 Metabolic encephalopathy: Secondary | ICD-10-CM | POA: Diagnosis not present

## 2023-05-31 DIAGNOSIS — R41841 Cognitive communication deficit: Secondary | ICD-10-CM | POA: Diagnosis not present

## 2023-05-31 DIAGNOSIS — Z87440 Personal history of urinary (tract) infections: Secondary | ICD-10-CM | POA: Diagnosis not present

## 2023-05-31 DIAGNOSIS — R6520 Severe sepsis without septic shock: Secondary | ICD-10-CM | POA: Diagnosis not present

## 2023-05-31 DIAGNOSIS — Z8619 Personal history of other infectious and parasitic diseases: Secondary | ICD-10-CM | POA: Diagnosis not present

## 2023-05-31 DIAGNOSIS — W19XXXA Unspecified fall, initial encounter: Secondary | ICD-10-CM | POA: Diagnosis not present

## 2023-05-31 DIAGNOSIS — Z7189 Other specified counseling: Secondary | ICD-10-CM | POA: Diagnosis not present

## 2023-05-31 DIAGNOSIS — R5381 Other malaise: Secondary | ICD-10-CM | POA: Diagnosis not present

## 2023-05-31 DIAGNOSIS — R531 Weakness: Secondary | ICD-10-CM | POA: Diagnosis not present

## 2023-05-31 DIAGNOSIS — M810 Age-related osteoporosis without current pathological fracture: Secondary | ICD-10-CM | POA: Diagnosis not present

## 2023-05-31 DIAGNOSIS — R279 Unspecified lack of coordination: Secondary | ICD-10-CM | POA: Diagnosis not present

## 2023-05-31 DIAGNOSIS — F1320 Sedative, hypnotic or anxiolytic dependence, uncomplicated: Secondary | ICD-10-CM | POA: Diagnosis not present

## 2023-05-31 DIAGNOSIS — N390 Urinary tract infection, site not specified: Secondary | ICD-10-CM | POA: Diagnosis not present

## 2023-05-31 DIAGNOSIS — M6281 Muscle weakness (generalized): Secondary | ICD-10-CM | POA: Diagnosis not present

## 2023-05-31 DIAGNOSIS — Z9181 History of falling: Secondary | ICD-10-CM | POA: Diagnosis not present

## 2023-05-31 DIAGNOSIS — E46 Unspecified protein-calorie malnutrition: Secondary | ICD-10-CM | POA: Diagnosis not present

## 2023-05-31 DIAGNOSIS — R1311 Dysphagia, oral phase: Secondary | ICD-10-CM | POA: Diagnosis not present

## 2023-05-31 DIAGNOSIS — G20A1 Parkinson's disease without dyskinesia, without mention of fluctuations: Secondary | ICD-10-CM | POA: Diagnosis not present

## 2023-05-31 MED ORDER — BISACODYL 10 MG RE SUPP: 10.0000 mg | Freq: Every day | RECTAL | 0 refills | Status: DC | PRN

## 2023-05-31 MED ORDER — ALPRAZOLAM 0.25 MG PO TABS: 0.2500 mg | ORAL_TABLET | Freq: Two times a day (BID) | ORAL | 0 refills | Status: DC

## 2023-05-31 MED ORDER — ADULT MULTIVITAMIN W/MINERALS CH: 1.0000 | ORAL_TABLET | Freq: Every day | ORAL | Status: DC

## 2023-05-31 MED ORDER — ACETAMINOPHEN 325 MG PO TABS: 650.0000 mg | ORAL_TABLET | Freq: Four times a day (QID) | ORAL | Status: AC | PRN

## 2023-05-31 NOTE — Progress Notes (Signed)
Tried to call the report to the facility 2 times. No one picked the phone from nurses station.

## 2023-05-31 NOTE — TOC Transition Note (Signed)
Transition of Care [facility]) - CM/SW Discharge Note   Patient Details  Name: Hannah Fisher MRN: [id number] Date of Birth: 1951-07-02  [facility] CM/SW Contact:  Deatra Robinson, [place] Phone Number: 05/31/2023, 12:24 PM   Clinical Narrative:  Pt for dc to [facility] today. Auth received and details provided to [facility] in admissions who confirmed they are prepared to admit pt to room 604P. Pt's dtr aware of dc and reports agreeable. RN provided with number for report and PTAR arranged for transport. SW signing off at dc.   Dellie Burns, MSW, LCSW [phone] (coverage)       Final next level of care: Skilled Nursing Facility Barriers to Discharge: No Barriers Identified   Patient Goals and CMS Choice   Choice offered to / list presented to : Adult Children  Discharge Placement                Patient chooses bed at: [facility] Patient to be transferred to facility by: PTAR Name of family member notified: Anna/dtr Patient and family notified of of transfer: 05/31/23  Discharge Plan and Services Additional resources added to the After Visit Summary for   In-house Referral: Clinical Social Work                                   Social Determinants of Health (SDOH) Interventions SDOH Screenings   Food Insecurity: No Food Insecurity (05/23/2023)  Housing: Low Risk  (05/23/2023)  Transportation Needs: No Transportation Needs (05/23/2023)  Utilities: Not At Risk (05/23/2023)  Tobacco Use: Low Risk  (05/22/2023)     Readmission Risk Interventions     No data to display

## 2023-05-31 NOTE — Discharge Summary (Signed)
DISCHARGE SUMMARY  Hannah Fisher  MR#: [id number]  DOB:1950/12/16  Date of Admission: 05/22/2023 Date of Discharge: 05/31/2023  Attending Physician:Jonai Weyland Silvestre Gunner, MD  Patient's KVQ:QVZD, Darlen Round, MD  Disposition: D/C to SNF for rehab stay   Follow-up Appts:  Follow-up Information     Daisy Floro, MD Follow up in 10 day(s).   Specialty: Family Medicine Contact information: [address] [place] [address] [phone]                 Discharge Diagnoses: E. coli pyelonephritis with bacteremia Acute metabolic encephalopathy due to pyelonephritis superimposed on chronic cognitive impairment Anxiety disorder Chronic normocytic anemia HTN Parkinson's disease Hypokalemia Hypophosphatemia Hyponatremia Chronic folate and B12 deficiency Underweight - Body mass index is 16.06 kg/m.  Initial presentation: 72 year old with a history of Parkinson's disease, incipient dementia, chronic Xanax use, HTN, and B12 and folic acid deficiencies who presented to the ER 8/20 with a fever, confusion, and having suffered a fall at [facility] assisted living facility. A full workup ultimately revealed gram-negative rod bacteremia related to pyelonephritis.   Hospital Course:  E. coli pyelonephritis with bacteremia CT confirmed findings of right sided pyelonephritis - blood cultures revealed E. Coli - has completed 7 days of antibiotic therapy, initial portion administered via IV -no persisting signs or symptoms of active infection   Acute metabolic encephalopathy due to pyelonephritis superimposed on chronic cognitive impairment Patient is intermittently confused forgetful and tangential though typically oriented to self and place and cooperative with instructions -she is not agitated nor is she combative -she willingly participates in rehab efforts   Anxiety disorder Xanax is relatively contraindicated in this patient but after long discussion with  the patient's family the decision was made to continue it for now at a low dose - outpatient tapering to off should be considered by her PCP   Chronic normocytic anemia Hemoglobin stable with no evidence of blood loss   HTN Continue lisinopril -blood pressure well-controlled   Parkinson's disease Continue Sinemet ropinirole and mirtazapine   Hypokalemia -hypophosphatemia -hyponatremia All related to poor intake -resolved with supplementation/volume expansion   Chronic folate and B12 deficiency Continue folate and B12 supplementation   Underweight - Body mass index is 16.06 kg/m.  Allergies as of 05/31/2023       Reactions   Prednisone Shortness Of Breath, Swelling, Other (See Comments)   Tightness in chest, trouble breathing, edema, and chest pressure   Penicillins Hives, Swelling, Other (See Comments)   Edema and itching, no hospitalization or airway required    Beta Adrenergic Blockers Nausea Only, Other (See Comments)   Made the patient feel badly   Sulfa Antibiotics Rash        Medication List     STOP taking these medications    acetaminophen 650 MG CR tablet Commonly known as: TYLENOL Replaced by: acetaminophen 325 MG tablet   feeding supplement Liqd   naproxen sodium 220 MG tablet Commonly known as: ALEVE   Thiamine Mononitrate 250 MG Tabs       TAKE these medications    acetaminophen 325 MG tablet Commonly known as: TYLENOL Take 2 tablets (650 mg total) by mouth every 6 (six) hours as needed for mild pain (or Fever >/= 101). Replaces: acetaminophen 650 MG CR tablet   ALPRAZolam 0.25 MG tablet Commonly known as: XANAX Take 1 tablet (0.25 mg total) by mouth 2 (two) times daily. What changed:  when to take this reasons to take this  aspirin EC 81 MG tablet Take 81 mg by mouth daily.   bisacodyl 10 MG suppository Commonly known as: DULCOLAX Place 1 suppository (10 mg total) rectally daily as needed for severe constipation.   Calcium 600  600 MG Tabs tablet Generic drug: calcium carbonate Take 600 mg by mouth daily.   carbidopa-levodopa 25-100 MG tablet Commonly known as: SINEMET IR Take 1.5-2 tablets by mouth See admin instructions. Take 2 tablets by mouth at 9 AM & 12 NOON, then 1.5 tablets at 7 PM   folic acid 800 MCG tablet Commonly known as: FOLVITE Take 800 mcg by mouth daily.   lisinopril 2.5 MG tablet Commonly known as: ZESTRIL Take 2.5 mg by mouth daily.   Magnesium Glycinate 100 MG Caps Take 200 mg by mouth daily.   mirtazapine 7.5 MG tablet Commonly known as: REMERON Take 7.5 mg by mouth at bedtime.   multivitamin with minerals Tabs tablet Take 1 tablet by mouth daily.   polyethylene glycol 17 g packet Commonly known as: MIRALAX / GLYCOLAX Take 17 g by mouth daily.   rOPINIRole 0.25 MG tablet Commonly known as: REQUIP Take 0.25 mg by mouth every evening.   thiamine 100 MG tablet Commonly known as: VITAMIN B1 Take 200 mg by mouth daily.        Day of Discharge BP 121/74 (BP Location: Right Arm)   Pulse 78   Temp 98 F (36.7 C) (Oral)   Resp 18   Ht 5\' 1"  (1.549 m)   Wt 38.6 kg   LMP 10/02/1990   SpO2 99%   BMI 16.06 kg/m   Physical Exam: General: No acute respiratory distress Lungs: Clear to auscultation bilaterally without wheezes or crackles Cardiovascular: Regular rate and rhythm without murmur gallop or rub normal S1 and S2 Abdomen: Nontender, nondistended, soft, bowel sounds positive, no rebound, no ascites, no appreciable mass Extremities: No significant cyanosis, clubbing, or edema bilateral lower extremities  Basic Metabolic Panel: Recent Labs  Lab 05/24/23 1823 05/25/23 0854 05/30/23 0615  NA  --  133* 139  K  --  3.8 4.3  CL  --  104 107  CO2  --  20* 22  GLUCOSE  --  96 99  BUN  --  8 15  CREATININE  --  0.58 0.91  CALCIUM  --  8.1* 9.0  PHOS 2.8 2.6  --     CBC: Recent Labs  Lab 05/30/23 0615  WBC 8.1  HGB 11.4*  HCT 35.6*  MCV 93.2  PLT 387     Time spent in discharge (includes decision making & examination of pt): 35 minutes  05/31/2023, 9:55 AM   Lonia Blood, MD [facility] Office  [phone]

## 2023-06-01 DIAGNOSIS — R5381 Other malaise: Secondary | ICD-10-CM | POA: Diagnosis not present

## 2023-06-01 DIAGNOSIS — G20A1 Parkinson's disease without dyskinesia, without mention of fluctuations: Secondary | ICD-10-CM | POA: Diagnosis not present

## 2023-06-01 DIAGNOSIS — G9341 Metabolic encephalopathy: Secondary | ICD-10-CM | POA: Diagnosis not present

## 2023-06-01 DIAGNOSIS — N10 Acute tubulo-interstitial nephritis: Secondary | ICD-10-CM | POA: Diagnosis not present

## 2023-06-01 DIAGNOSIS — E639 Nutritional deficiency, unspecified: Secondary | ICD-10-CM | POA: Diagnosis not present

## 2023-06-01 DIAGNOSIS — R296 Repeated falls: Secondary | ICD-10-CM | POA: Diagnosis not present

## 2023-06-01 DIAGNOSIS — I10 Essential (primary) hypertension: Secondary | ICD-10-CM | POA: Diagnosis not present

## 2023-06-01 DIAGNOSIS — M810 Age-related osteoporosis without current pathological fracture: Secondary | ICD-10-CM | POA: Diagnosis not present

## 2023-06-01 DIAGNOSIS — F1320 Sedative, hypnotic or anxiolytic dependence, uncomplicated: Secondary | ICD-10-CM | POA: Diagnosis not present

## 2023-06-06 DIAGNOSIS — E46 Unspecified protein-calorie malnutrition: Secondary | ICD-10-CM | POA: Diagnosis not present

## 2023-06-06 DIAGNOSIS — G20A1 Parkinson's disease without dyskinesia, without mention of fluctuations: Secondary | ICD-10-CM | POA: Diagnosis not present

## 2023-06-06 DIAGNOSIS — Z9181 History of falling: Secondary | ICD-10-CM | POA: Diagnosis not present

## 2023-06-06 DIAGNOSIS — M6281 Muscle weakness (generalized): Secondary | ICD-10-CM | POA: Diagnosis not present

## 2023-06-06 DIAGNOSIS — M810 Age-related osteoporosis without current pathological fracture: Secondary | ICD-10-CM | POA: Diagnosis not present

## 2023-06-08 DIAGNOSIS — E46 Unspecified protein-calorie malnutrition: Secondary | ICD-10-CM | POA: Diagnosis not present

## 2023-06-08 DIAGNOSIS — M6281 Muscle weakness (generalized): Secondary | ICD-10-CM | POA: Diagnosis not present

## 2023-06-08 DIAGNOSIS — G20A1 Parkinson's disease without dyskinesia, without mention of fluctuations: Secondary | ICD-10-CM | POA: Diagnosis not present

## 2023-06-08 DIAGNOSIS — Z9181 History of falling: Secondary | ICD-10-CM | POA: Diagnosis not present

## 2023-06-08 DIAGNOSIS — M810 Age-related osteoporosis without current pathological fracture: Secondary | ICD-10-CM | POA: Diagnosis not present

## 2023-06-11 DIAGNOSIS — Z7189 Other specified counseling: Secondary | ICD-10-CM | POA: Diagnosis not present

## 2023-06-11 DIAGNOSIS — G20A1 Parkinson's disease without dyskinesia, without mention of fluctuations: Secondary | ICD-10-CM | POA: Diagnosis not present

## 2023-06-11 DIAGNOSIS — Z87448 Personal history of other diseases of urinary system: Secondary | ICD-10-CM | POA: Diagnosis not present

## 2023-06-11 DIAGNOSIS — Z8619 Personal history of other infectious and parasitic diseases: Secondary | ICD-10-CM | POA: Diagnosis not present

## 2023-06-11 DIAGNOSIS — F1320 Sedative, hypnotic or anxiolytic dependence, uncomplicated: Secondary | ICD-10-CM | POA: Diagnosis not present

## 2023-06-14 DIAGNOSIS — W19XXXA Unspecified fall, initial encounter: Secondary | ICD-10-CM | POA: Diagnosis not present

## 2023-06-14 DIAGNOSIS — M25562 Pain in left knee: Secondary | ICD-10-CM | POA: Diagnosis not present

## 2023-06-14 DIAGNOSIS — Z87440 Personal history of urinary (tract) infections: Secondary | ICD-10-CM | POA: Diagnosis not present

## 2023-06-14 DIAGNOSIS — Z8619 Personal history of other infectious and parasitic diseases: Secondary | ICD-10-CM | POA: Diagnosis not present

## 2023-06-14 DIAGNOSIS — G20A1 Parkinson's disease without dyskinesia, without mention of fluctuations: Secondary | ICD-10-CM | POA: Diagnosis not present

## 2023-06-14 DIAGNOSIS — R296 Repeated falls: Secondary | ICD-10-CM | POA: Diagnosis not present

## 2023-06-15 DIAGNOSIS — G20A1 Parkinson's disease without dyskinesia, without mention of fluctuations: Secondary | ICD-10-CM | POA: Diagnosis not present

## 2023-06-15 DIAGNOSIS — Z9181 History of falling: Secondary | ICD-10-CM | POA: Diagnosis not present

## 2023-06-15 DIAGNOSIS — M6281 Muscle weakness (generalized): Secondary | ICD-10-CM | POA: Diagnosis not present

## 2023-06-15 DIAGNOSIS — E46 Unspecified protein-calorie malnutrition: Secondary | ICD-10-CM | POA: Diagnosis not present

## 2023-06-15 DIAGNOSIS — M810 Age-related osteoporosis without current pathological fracture: Secondary | ICD-10-CM | POA: Diagnosis not present

## 2023-06-17 DIAGNOSIS — M6281 Muscle weakness (generalized): Secondary | ICD-10-CM | POA: Diagnosis not present

## 2023-06-17 DIAGNOSIS — N10 Acute tubulo-interstitial nephritis: Secondary | ICD-10-CM | POA: Diagnosis not present

## 2023-06-17 DIAGNOSIS — R2681 Unsteadiness on feet: Secondary | ICD-10-CM | POA: Diagnosis not present

## 2023-06-17 DIAGNOSIS — R279 Unspecified lack of coordination: Secondary | ICD-10-CM | POA: Diagnosis not present

## 2023-06-17 DIAGNOSIS — R41841 Cognitive communication deficit: Secondary | ICD-10-CM | POA: Diagnosis not present

## 2023-06-17 DIAGNOSIS — R1311 Dysphagia, oral phase: Secondary | ICD-10-CM | POA: Diagnosis not present

## 2023-06-17 DIAGNOSIS — R2689 Other abnormalities of gait and mobility: Secondary | ICD-10-CM | POA: Diagnosis not present

## 2023-06-18 DIAGNOSIS — R41841 Cognitive communication deficit: Secondary | ICD-10-CM | POA: Diagnosis not present

## 2023-06-18 DIAGNOSIS — N10 Acute tubulo-interstitial nephritis: Secondary | ICD-10-CM | POA: Diagnosis not present

## 2023-06-18 DIAGNOSIS — E46 Unspecified protein-calorie malnutrition: Secondary | ICD-10-CM | POA: Diagnosis not present

## 2023-06-18 DIAGNOSIS — Z9181 History of falling: Secondary | ICD-10-CM | POA: Diagnosis not present

## 2023-06-18 DIAGNOSIS — R2689 Other abnormalities of gait and mobility: Secondary | ICD-10-CM | POA: Diagnosis not present

## 2023-06-18 DIAGNOSIS — R1311 Dysphagia, oral phase: Secondary | ICD-10-CM | POA: Diagnosis not present

## 2023-06-18 DIAGNOSIS — R279 Unspecified lack of coordination: Secondary | ICD-10-CM | POA: Diagnosis not present

## 2023-06-18 DIAGNOSIS — F0390 Unspecified dementia without behavioral disturbance: Secondary | ICD-10-CM | POA: Diagnosis not present

## 2023-06-18 DIAGNOSIS — M810 Age-related osteoporosis without current pathological fracture: Secondary | ICD-10-CM | POA: Diagnosis not present

## 2023-06-18 DIAGNOSIS — F4321 Adjustment disorder with depressed mood: Secondary | ICD-10-CM | POA: Diagnosis not present

## 2023-06-18 DIAGNOSIS — R2681 Unsteadiness on feet: Secondary | ICD-10-CM | POA: Diagnosis not present

## 2023-06-18 DIAGNOSIS — M6281 Muscle weakness (generalized): Secondary | ICD-10-CM | POA: Diagnosis not present

## 2023-06-18 DIAGNOSIS — G20A1 Parkinson's disease without dyskinesia, without mention of fluctuations: Secondary | ICD-10-CM | POA: Diagnosis not present

## 2023-06-18 DIAGNOSIS — F413 Other mixed anxiety disorders: Secondary | ICD-10-CM | POA: Diagnosis not present

## 2023-06-19 DIAGNOSIS — R2689 Other abnormalities of gait and mobility: Secondary | ICD-10-CM | POA: Diagnosis not present

## 2023-06-19 DIAGNOSIS — M6281 Muscle weakness (generalized): Secondary | ICD-10-CM | POA: Diagnosis not present

## 2023-06-19 DIAGNOSIS — R1311 Dysphagia, oral phase: Secondary | ICD-10-CM | POA: Diagnosis not present

## 2023-06-19 DIAGNOSIS — N10 Acute tubulo-interstitial nephritis: Secondary | ICD-10-CM | POA: Diagnosis not present

## 2023-06-19 DIAGNOSIS — R2681 Unsteadiness on feet: Secondary | ICD-10-CM | POA: Diagnosis not present

## 2023-06-19 DIAGNOSIS — R279 Unspecified lack of coordination: Secondary | ICD-10-CM | POA: Diagnosis not present

## 2023-06-19 DIAGNOSIS — R41841 Cognitive communication deficit: Secondary | ICD-10-CM | POA: Diagnosis not present

## 2023-06-20 DIAGNOSIS — R41841 Cognitive communication deficit: Secondary | ICD-10-CM | POA: Diagnosis not present

## 2023-06-20 DIAGNOSIS — E46 Unspecified protein-calorie malnutrition: Secondary | ICD-10-CM | POA: Diagnosis not present

## 2023-06-20 DIAGNOSIS — M6281 Muscle weakness (generalized): Secondary | ICD-10-CM | POA: Diagnosis not present

## 2023-06-20 DIAGNOSIS — G20A1 Parkinson's disease without dyskinesia, without mention of fluctuations: Secondary | ICD-10-CM | POA: Diagnosis not present

## 2023-06-20 DIAGNOSIS — R2681 Unsteadiness on feet: Secondary | ICD-10-CM | POA: Diagnosis not present

## 2023-06-20 DIAGNOSIS — R2689 Other abnormalities of gait and mobility: Secondary | ICD-10-CM | POA: Diagnosis not present

## 2023-06-20 DIAGNOSIS — R1311 Dysphagia, oral phase: Secondary | ICD-10-CM | POA: Diagnosis not present

## 2023-06-20 DIAGNOSIS — N10 Acute tubulo-interstitial nephritis: Secondary | ICD-10-CM | POA: Diagnosis not present

## 2023-06-20 DIAGNOSIS — M810 Age-related osteoporosis without current pathological fracture: Secondary | ICD-10-CM | POA: Diagnosis not present

## 2023-06-20 DIAGNOSIS — Z9181 History of falling: Secondary | ICD-10-CM | POA: Diagnosis not present

## 2023-06-20 DIAGNOSIS — R279 Unspecified lack of coordination: Secondary | ICD-10-CM | POA: Diagnosis not present

## 2023-06-21 DIAGNOSIS — M810 Age-related osteoporosis without current pathological fracture: Secondary | ICD-10-CM | POA: Diagnosis not present

## 2023-06-21 DIAGNOSIS — N10 Acute tubulo-interstitial nephritis: Secondary | ICD-10-CM | POA: Diagnosis not present

## 2023-06-21 DIAGNOSIS — R634 Abnormal weight loss: Secondary | ICD-10-CM | POA: Diagnosis not present

## 2023-06-21 DIAGNOSIS — R2689 Other abnormalities of gait and mobility: Secondary | ICD-10-CM | POA: Diagnosis not present

## 2023-06-21 DIAGNOSIS — R1311 Dysphagia, oral phase: Secondary | ICD-10-CM | POA: Diagnosis not present

## 2023-06-21 DIAGNOSIS — R279 Unspecified lack of coordination: Secondary | ICD-10-CM | POA: Diagnosis not present

## 2023-06-21 DIAGNOSIS — M5430 Sciatica, unspecified side: Secondary | ICD-10-CM | POA: Diagnosis not present

## 2023-06-21 DIAGNOSIS — R41841 Cognitive communication deficit: Secondary | ICD-10-CM | POA: Diagnosis not present

## 2023-06-21 DIAGNOSIS — R2681 Unsteadiness on feet: Secondary | ICD-10-CM | POA: Diagnosis not present

## 2023-06-21 DIAGNOSIS — G20A1 Parkinson's disease without dyskinesia, without mention of fluctuations: Secondary | ICD-10-CM | POA: Diagnosis not present

## 2023-06-21 DIAGNOSIS — M6281 Muscle weakness (generalized): Secondary | ICD-10-CM | POA: Diagnosis not present

## 2023-06-21 DIAGNOSIS — Z515 Encounter for palliative care: Secondary | ICD-10-CM | POA: Diagnosis not present

## 2023-06-22 DIAGNOSIS — R41841 Cognitive communication deficit: Secondary | ICD-10-CM | POA: Diagnosis not present

## 2023-06-22 DIAGNOSIS — N10 Acute tubulo-interstitial nephritis: Secondary | ICD-10-CM | POA: Diagnosis not present

## 2023-06-22 DIAGNOSIS — R279 Unspecified lack of coordination: Secondary | ICD-10-CM | POA: Diagnosis not present

## 2023-06-22 DIAGNOSIS — R2689 Other abnormalities of gait and mobility: Secondary | ICD-10-CM | POA: Diagnosis not present

## 2023-06-22 DIAGNOSIS — R2681 Unsteadiness on feet: Secondary | ICD-10-CM | POA: Diagnosis not present

## 2023-06-22 DIAGNOSIS — R1311 Dysphagia, oral phase: Secondary | ICD-10-CM | POA: Diagnosis not present

## 2023-06-22 DIAGNOSIS — M6281 Muscle weakness (generalized): Secondary | ICD-10-CM | POA: Diagnosis not present

## 2023-06-23 DIAGNOSIS — R2681 Unsteadiness on feet: Secondary | ICD-10-CM | POA: Diagnosis not present

## 2023-06-23 DIAGNOSIS — M6281 Muscle weakness (generalized): Secondary | ICD-10-CM | POA: Diagnosis not present

## 2023-06-23 DIAGNOSIS — R279 Unspecified lack of coordination: Secondary | ICD-10-CM | POA: Diagnosis not present

## 2023-06-23 DIAGNOSIS — N10 Acute tubulo-interstitial nephritis: Secondary | ICD-10-CM | POA: Diagnosis not present

## 2023-06-23 DIAGNOSIS — R1311 Dysphagia, oral phase: Secondary | ICD-10-CM | POA: Diagnosis not present

## 2023-06-23 DIAGNOSIS — R41841 Cognitive communication deficit: Secondary | ICD-10-CM | POA: Diagnosis not present

## 2023-06-23 DIAGNOSIS — R2689 Other abnormalities of gait and mobility: Secondary | ICD-10-CM | POA: Diagnosis not present

## 2023-06-24 DIAGNOSIS — R2681 Unsteadiness on feet: Secondary | ICD-10-CM | POA: Diagnosis not present

## 2023-06-24 DIAGNOSIS — M6281 Muscle weakness (generalized): Secondary | ICD-10-CM | POA: Diagnosis not present

## 2023-06-24 DIAGNOSIS — N10 Acute tubulo-interstitial nephritis: Secondary | ICD-10-CM | POA: Diagnosis not present

## 2023-06-24 DIAGNOSIS — R279 Unspecified lack of coordination: Secondary | ICD-10-CM | POA: Diagnosis not present

## 2023-06-24 DIAGNOSIS — R41841 Cognitive communication deficit: Secondary | ICD-10-CM | POA: Diagnosis not present

## 2023-06-24 DIAGNOSIS — R1311 Dysphagia, oral phase: Secondary | ICD-10-CM | POA: Diagnosis not present

## 2023-06-24 DIAGNOSIS — R2689 Other abnormalities of gait and mobility: Secondary | ICD-10-CM | POA: Diagnosis not present

## 2023-06-25 DIAGNOSIS — M810 Age-related osteoporosis without current pathological fracture: Secondary | ICD-10-CM | POA: Diagnosis not present

## 2023-06-25 DIAGNOSIS — R1311 Dysphagia, oral phase: Secondary | ICD-10-CM | POA: Diagnosis not present

## 2023-06-25 DIAGNOSIS — R2689 Other abnormalities of gait and mobility: Secondary | ICD-10-CM | POA: Diagnosis not present

## 2023-06-25 DIAGNOSIS — M6281 Muscle weakness (generalized): Secondary | ICD-10-CM | POA: Diagnosis not present

## 2023-06-25 DIAGNOSIS — E46 Unspecified protein-calorie malnutrition: Secondary | ICD-10-CM | POA: Diagnosis not present

## 2023-06-25 DIAGNOSIS — R41841 Cognitive communication deficit: Secondary | ICD-10-CM | POA: Diagnosis not present

## 2023-06-25 DIAGNOSIS — Z9181 History of falling: Secondary | ICD-10-CM | POA: Diagnosis not present

## 2023-06-25 DIAGNOSIS — N10 Acute tubulo-interstitial nephritis: Secondary | ICD-10-CM | POA: Diagnosis not present

## 2023-06-25 DIAGNOSIS — G20A1 Parkinson's disease without dyskinesia, without mention of fluctuations: Secondary | ICD-10-CM | POA: Diagnosis not present

## 2023-06-25 DIAGNOSIS — R2681 Unsteadiness on feet: Secondary | ICD-10-CM | POA: Diagnosis not present

## 2023-06-25 DIAGNOSIS — R279 Unspecified lack of coordination: Secondary | ICD-10-CM | POA: Diagnosis not present

## 2023-06-27 DIAGNOSIS — I10 Essential (primary) hypertension: Secondary | ICD-10-CM | POA: Diagnosis not present

## 2023-06-27 DIAGNOSIS — G20A1 Parkinson's disease without dyskinesia, without mention of fluctuations: Secondary | ICD-10-CM | POA: Diagnosis not present

## 2023-06-27 DIAGNOSIS — F419 Anxiety disorder, unspecified: Secondary | ICD-10-CM | POA: Diagnosis not present

## 2023-06-27 DIAGNOSIS — Z87448 Personal history of other diseases of urinary system: Secondary | ICD-10-CM | POA: Diagnosis not present

## 2023-06-27 DIAGNOSIS — M810 Age-related osteoporosis without current pathological fracture: Secondary | ICD-10-CM | POA: Diagnosis not present

## 2023-06-27 DIAGNOSIS — R531 Weakness: Secondary | ICD-10-CM | POA: Diagnosis not present

## 2023-06-27 DIAGNOSIS — M25562 Pain in left knee: Secondary | ICD-10-CM | POA: Diagnosis not present

## 2023-06-30 DIAGNOSIS — M810 Age-related osteoporosis without current pathological fracture: Secondary | ICD-10-CM | POA: Diagnosis not present

## 2023-06-30 DIAGNOSIS — G20A1 Parkinson's disease without dyskinesia, without mention of fluctuations: Secondary | ICD-10-CM | POA: Diagnosis not present

## 2023-06-30 DIAGNOSIS — F1320 Sedative, hypnotic or anxiolytic dependence, uncomplicated: Secondary | ICD-10-CM | POA: Diagnosis not present

## 2023-06-30 DIAGNOSIS — R1310 Dysphagia, unspecified: Secondary | ICD-10-CM | POA: Diagnosis not present

## 2023-06-30 DIAGNOSIS — M1990 Unspecified osteoarthritis, unspecified site: Secondary | ICD-10-CM | POA: Diagnosis not present

## 2023-06-30 DIAGNOSIS — E43 Unspecified severe protein-calorie malnutrition: Secondary | ICD-10-CM | POA: Diagnosis not present

## 2023-06-30 DIAGNOSIS — I10 Essential (primary) hypertension: Secondary | ICD-10-CM | POA: Diagnosis not present

## 2023-06-30 DIAGNOSIS — E876 Hypokalemia: Secondary | ICD-10-CM | POA: Diagnosis not present

## 2023-06-30 DIAGNOSIS — G2581 Restless legs syndrome: Secondary | ICD-10-CM | POA: Diagnosis not present

## 2023-07-02 DIAGNOSIS — E43 Unspecified severe protein-calorie malnutrition: Secondary | ICD-10-CM | POA: Diagnosis not present

## 2023-07-02 DIAGNOSIS — M810 Age-related osteoporosis without current pathological fracture: Secondary | ICD-10-CM | POA: Diagnosis not present

## 2023-07-02 DIAGNOSIS — R1310 Dysphagia, unspecified: Secondary | ICD-10-CM | POA: Diagnosis not present

## 2023-07-02 DIAGNOSIS — G20A1 Parkinson's disease without dyskinesia, without mention of fluctuations: Secondary | ICD-10-CM | POA: Diagnosis not present

## 2023-07-02 DIAGNOSIS — G2581 Restless legs syndrome: Secondary | ICD-10-CM | POA: Diagnosis not present

## 2023-07-02 DIAGNOSIS — M1990 Unspecified osteoarthritis, unspecified site: Secondary | ICD-10-CM | POA: Diagnosis not present

## 2023-07-02 DIAGNOSIS — F1320 Sedative, hypnotic or anxiolytic dependence, uncomplicated: Secondary | ICD-10-CM | POA: Diagnosis not present

## 2023-07-02 DIAGNOSIS — I10 Essential (primary) hypertension: Secondary | ICD-10-CM | POA: Diagnosis not present

## 2023-07-02 DIAGNOSIS — E876 Hypokalemia: Secondary | ICD-10-CM | POA: Diagnosis not present

## 2023-07-03 DIAGNOSIS — H524 Presbyopia: Secondary | ICD-10-CM | POA: Diagnosis not present

## 2023-07-03 DIAGNOSIS — E43 Unspecified severe protein-calorie malnutrition: Secondary | ICD-10-CM | POA: Diagnosis not present

## 2023-07-03 DIAGNOSIS — R1310 Dysphagia, unspecified: Secondary | ICD-10-CM | POA: Diagnosis not present

## 2023-07-03 DIAGNOSIS — E876 Hypokalemia: Secondary | ICD-10-CM | POA: Diagnosis not present

## 2023-07-03 DIAGNOSIS — H52223 Regular astigmatism, bilateral: Secondary | ICD-10-CM | POA: Diagnosis not present

## 2023-07-03 DIAGNOSIS — Z135 Encounter for screening for eye and ear disorders: Secondary | ICD-10-CM | POA: Diagnosis not present

## 2023-07-03 DIAGNOSIS — G20A1 Parkinson's disease without dyskinesia, without mention of fluctuations: Secondary | ICD-10-CM | POA: Diagnosis not present

## 2023-07-03 DIAGNOSIS — H5203 Hypermetropia, bilateral: Secondary | ICD-10-CM | POA: Diagnosis not present

## 2023-07-03 DIAGNOSIS — G2581 Restless legs syndrome: Secondary | ICD-10-CM | POA: Diagnosis not present

## 2023-07-03 DIAGNOSIS — H5702 Anisocoria: Secondary | ICD-10-CM | POA: Diagnosis not present

## 2023-07-03 DIAGNOSIS — F1320 Sedative, hypnotic or anxiolytic dependence, uncomplicated: Secondary | ICD-10-CM | POA: Diagnosis not present

## 2023-07-03 DIAGNOSIS — I10 Essential (primary) hypertension: Secondary | ICD-10-CM | POA: Diagnosis not present

## 2023-07-03 DIAGNOSIS — M810 Age-related osteoporosis without current pathological fracture: Secondary | ICD-10-CM | POA: Diagnosis not present

## 2023-07-03 DIAGNOSIS — M1990 Unspecified osteoarthritis, unspecified site: Secondary | ICD-10-CM | POA: Diagnosis not present

## 2023-07-10 DIAGNOSIS — I10 Essential (primary) hypertension: Secondary | ICD-10-CM | POA: Diagnosis not present

## 2023-07-10 DIAGNOSIS — F1320 Sedative, hypnotic or anxiolytic dependence, uncomplicated: Secondary | ICD-10-CM | POA: Diagnosis not present

## 2023-07-10 DIAGNOSIS — M810 Age-related osteoporosis without current pathological fracture: Secondary | ICD-10-CM | POA: Diagnosis not present

## 2023-07-10 DIAGNOSIS — G20A1 Parkinson's disease without dyskinesia, without mention of fluctuations: Secondary | ICD-10-CM | POA: Diagnosis not present

## 2023-07-10 DIAGNOSIS — E876 Hypokalemia: Secondary | ICD-10-CM | POA: Diagnosis not present

## 2023-07-10 DIAGNOSIS — R1310 Dysphagia, unspecified: Secondary | ICD-10-CM | POA: Diagnosis not present

## 2023-07-10 DIAGNOSIS — M1990 Unspecified osteoarthritis, unspecified site: Secondary | ICD-10-CM | POA: Diagnosis not present

## 2023-07-10 DIAGNOSIS — G2581 Restless legs syndrome: Secondary | ICD-10-CM | POA: Diagnosis not present

## 2023-07-10 DIAGNOSIS — E43 Unspecified severe protein-calorie malnutrition: Secondary | ICD-10-CM | POA: Diagnosis not present

## 2023-07-11 DIAGNOSIS — I10 Essential (primary) hypertension: Secondary | ICD-10-CM | POA: Diagnosis not present

## 2023-07-11 DIAGNOSIS — F1320 Sedative, hypnotic or anxiolytic dependence, uncomplicated: Secondary | ICD-10-CM | POA: Diagnosis not present

## 2023-07-11 DIAGNOSIS — G2581 Restless legs syndrome: Secondary | ICD-10-CM | POA: Diagnosis not present

## 2023-07-11 DIAGNOSIS — E876 Hypokalemia: Secondary | ICD-10-CM | POA: Diagnosis not present

## 2023-07-11 DIAGNOSIS — R1310 Dysphagia, unspecified: Secondary | ICD-10-CM | POA: Diagnosis not present

## 2023-07-11 DIAGNOSIS — M1990 Unspecified osteoarthritis, unspecified site: Secondary | ICD-10-CM | POA: Diagnosis not present

## 2023-07-11 DIAGNOSIS — E43 Unspecified severe protein-calorie malnutrition: Secondary | ICD-10-CM | POA: Diagnosis not present

## 2023-07-11 DIAGNOSIS — G20A1 Parkinson's disease without dyskinesia, without mention of fluctuations: Secondary | ICD-10-CM | POA: Diagnosis not present

## 2023-07-11 DIAGNOSIS — M810 Age-related osteoporosis without current pathological fracture: Secondary | ICD-10-CM | POA: Diagnosis not present

## 2023-07-12 DIAGNOSIS — F1320 Sedative, hypnotic or anxiolytic dependence, uncomplicated: Secondary | ICD-10-CM | POA: Diagnosis not present

## 2023-07-12 DIAGNOSIS — G20A1 Parkinson's disease without dyskinesia, without mention of fluctuations: Secondary | ICD-10-CM | POA: Diagnosis not present

## 2023-07-12 DIAGNOSIS — I10 Essential (primary) hypertension: Secondary | ICD-10-CM | POA: Diagnosis not present

## 2023-07-12 DIAGNOSIS — M810 Age-related osteoporosis without current pathological fracture: Secondary | ICD-10-CM | POA: Diagnosis not present

## 2023-07-12 DIAGNOSIS — E876 Hypokalemia: Secondary | ICD-10-CM | POA: Diagnosis not present

## 2023-07-12 DIAGNOSIS — M1990 Unspecified osteoarthritis, unspecified site: Secondary | ICD-10-CM | POA: Diagnosis not present

## 2023-07-12 DIAGNOSIS — E43 Unspecified severe protein-calorie malnutrition: Secondary | ICD-10-CM | POA: Diagnosis not present

## 2023-07-12 DIAGNOSIS — R1310 Dysphagia, unspecified: Secondary | ICD-10-CM | POA: Diagnosis not present

## 2023-07-12 DIAGNOSIS — G2581 Restless legs syndrome: Secondary | ICD-10-CM | POA: Diagnosis not present

## 2023-07-16 DIAGNOSIS — E876 Hypokalemia: Secondary | ICD-10-CM | POA: Diagnosis not present

## 2023-07-16 DIAGNOSIS — M810 Age-related osteoporosis without current pathological fracture: Secondary | ICD-10-CM | POA: Diagnosis not present

## 2023-07-16 DIAGNOSIS — M1990 Unspecified osteoarthritis, unspecified site: Secondary | ICD-10-CM | POA: Diagnosis not present

## 2023-07-16 DIAGNOSIS — G2581 Restless legs syndrome: Secondary | ICD-10-CM | POA: Diagnosis not present

## 2023-07-16 DIAGNOSIS — F1320 Sedative, hypnotic or anxiolytic dependence, uncomplicated: Secondary | ICD-10-CM | POA: Diagnosis not present

## 2023-07-16 DIAGNOSIS — I10 Essential (primary) hypertension: Secondary | ICD-10-CM | POA: Diagnosis not present

## 2023-07-16 DIAGNOSIS — R1310 Dysphagia, unspecified: Secondary | ICD-10-CM | POA: Diagnosis not present

## 2023-07-16 DIAGNOSIS — G20A1 Parkinson's disease without dyskinesia, without mention of fluctuations: Secondary | ICD-10-CM | POA: Diagnosis not present

## 2023-07-16 DIAGNOSIS — E43 Unspecified severe protein-calorie malnutrition: Secondary | ICD-10-CM | POA: Diagnosis not present

## 2023-07-17 ENCOUNTER — Encounter: Payer: Self-pay | Admitting: Podiatry

## 2023-07-17 ENCOUNTER — Ambulatory Visit: Payer: Medicare PPO

## 2023-07-17 ENCOUNTER — Ambulatory Visit (INDEPENDENT_AMBULATORY_CARE_PROVIDER_SITE_OTHER): Payer: Medicare PPO

## 2023-07-17 ENCOUNTER — Ambulatory Visit: Payer: Medicare PPO | Admitting: Podiatry

## 2023-07-17 VITALS — BP 125/81 | HR 85 | Resp 18 | Ht 61.0 in | Wt 85.0 lb

## 2023-07-17 DIAGNOSIS — M810 Age-related osteoporosis without current pathological fracture: Secondary | ICD-10-CM | POA: Diagnosis not present

## 2023-07-17 DIAGNOSIS — M1990 Unspecified osteoarthritis, unspecified site: Secondary | ICD-10-CM | POA: Diagnosis not present

## 2023-07-17 DIAGNOSIS — R1310 Dysphagia, unspecified: Secondary | ICD-10-CM | POA: Diagnosis not present

## 2023-07-17 DIAGNOSIS — G2581 Restless legs syndrome: Secondary | ICD-10-CM | POA: Diagnosis not present

## 2023-07-17 DIAGNOSIS — M79676 Pain in unspecified toe(s): Secondary | ICD-10-CM | POA: Diagnosis not present

## 2023-07-17 DIAGNOSIS — G20A1 Parkinson's disease without dyskinesia, without mention of fluctuations: Secondary | ICD-10-CM | POA: Diagnosis not present

## 2023-07-17 DIAGNOSIS — E876 Hypokalemia: Secondary | ICD-10-CM | POA: Diagnosis not present

## 2023-07-17 DIAGNOSIS — I10 Essential (primary) hypertension: Secondary | ICD-10-CM | POA: Diagnosis not present

## 2023-07-17 DIAGNOSIS — B351 Tinea unguium: Secondary | ICD-10-CM | POA: Diagnosis not present

## 2023-07-17 DIAGNOSIS — M778 Other enthesopathies, not elsewhere classified: Secondary | ICD-10-CM

## 2023-07-17 DIAGNOSIS — E43 Unspecified severe protein-calorie malnutrition: Secondary | ICD-10-CM | POA: Diagnosis not present

## 2023-07-17 DIAGNOSIS — F1320 Sedative, hypnotic or anxiolytic dependence, uncomplicated: Secondary | ICD-10-CM | POA: Diagnosis not present

## 2023-07-17 NOTE — Progress Notes (Signed)
As long subjective: Chief Complaint  Patient presents with   Routine Post Op    PATIENT STATES ONCE IN WHILE SHE HAS PAIN IN HER RIGHT FOOT.    Hannah Fisher is a 72 y.o. female patient who presents to office today with concern of long,mildly painful nails.  She has been experiencing discomfort intermittently to the right foot mostly in the central aspect on the top and bottom.  She has occasional discomfort in the left foot is not as well provide.  She describes some recent falls.  Does not report any injuries to repeat.  No recent treatment therapy.  Objective: General: Patient is awake, alert, and oriented x 3 and in no acute distress.  Integument: Skin is warm, dry and supple bilateral. Nails are tender, long, thickened and  dystrophic  1-5 bilateral. No signs of infection. No open lesions or preulcerative lesions present bilateral. Remaining integument unremarkable.  Vasculature:  Dorsalis Pedis pulse 2/4 bilateral. Posterior Tibial pulse  1/4 bilateral.  Capillary fill time <3 sec 1-5 bilateral.   Neurology: The patient has intact sensation intact.  Musculoskeletal: Mild this, dorsal aspect of the foot right side on the Lisfranc area but no specific area pinpoint tenderness.  Flexor, extensor tendons appear to be intact.  No significant pain along the left foot today. No other areas of tenderness.  Assessment and Plan: Symptomatic onychomycosis  -Treatment options discussed including all alternatives, risks, and complications -Etiology of symptoms were discussed -X-rays obtained reviewed of bilateral feet.  Patient surgery was performed with spurring considering area on the second metatarsocuneiform joint.  There is no evidence of acute fracture. -Discussed shoes, good arch support.  Discussed Voltaren gel or possibly lidocaine.  Ice on as needed. -Nails debrided 10 without complications or bleeding. -Daily foot inspection -Follow-up in 3 months or sooner if any problems  arise. In the meantime, encouraged to call the office with any questions, concerns, change in symptoms.   Ovid Curd, DPM

## 2023-07-17 NOTE — Patient Instructions (Signed)
You can use Aspercreme with lidocaine or Voltaren gel topically as needed.

## 2023-07-18 DIAGNOSIS — M810 Age-related osteoporosis without current pathological fracture: Secondary | ICD-10-CM | POA: Diagnosis not present

## 2023-07-18 DIAGNOSIS — I10 Essential (primary) hypertension: Secondary | ICD-10-CM | POA: Diagnosis not present

## 2023-07-18 DIAGNOSIS — E876 Hypokalemia: Secondary | ICD-10-CM | POA: Diagnosis not present

## 2023-07-18 DIAGNOSIS — R1310 Dysphagia, unspecified: Secondary | ICD-10-CM | POA: Diagnosis not present

## 2023-07-18 DIAGNOSIS — G2581 Restless legs syndrome: Secondary | ICD-10-CM | POA: Diagnosis not present

## 2023-07-18 DIAGNOSIS — G20A1 Parkinson's disease without dyskinesia, without mention of fluctuations: Secondary | ICD-10-CM | POA: Diagnosis not present

## 2023-07-18 DIAGNOSIS — F1320 Sedative, hypnotic or anxiolytic dependence, uncomplicated: Secondary | ICD-10-CM | POA: Diagnosis not present

## 2023-07-18 DIAGNOSIS — M1990 Unspecified osteoarthritis, unspecified site: Secondary | ICD-10-CM | POA: Diagnosis not present

## 2023-07-18 DIAGNOSIS — E43 Unspecified severe protein-calorie malnutrition: Secondary | ICD-10-CM | POA: Diagnosis not present

## 2023-07-21 DIAGNOSIS — G20A1 Parkinson's disease without dyskinesia, without mention of fluctuations: Secondary | ICD-10-CM | POA: Diagnosis not present

## 2023-07-21 DIAGNOSIS — M810 Age-related osteoporosis without current pathological fracture: Secondary | ICD-10-CM | POA: Diagnosis not present

## 2023-07-21 DIAGNOSIS — M6281 Muscle weakness (generalized): Secondary | ICD-10-CM | POA: Diagnosis not present

## 2023-07-21 DIAGNOSIS — M5430 Sciatica, unspecified side: Secondary | ICD-10-CM | POA: Diagnosis not present

## 2023-07-23 DIAGNOSIS — N390 Urinary tract infection, site not specified: Secondary | ICD-10-CM | POA: Diagnosis not present

## 2023-07-23 DIAGNOSIS — E7800 Pure hypercholesterolemia, unspecified: Secondary | ICD-10-CM | POA: Diagnosis not present

## 2023-07-23 DIAGNOSIS — Z09 Encounter for follow-up examination after completed treatment for conditions other than malignant neoplasm: Secondary | ICD-10-CM | POA: Diagnosis not present

## 2023-07-23 DIAGNOSIS — Z681 Body mass index (BMI) 19 or less, adult: Secondary | ICD-10-CM | POA: Diagnosis not present

## 2023-07-23 DIAGNOSIS — Z9181 History of falling: Secondary | ICD-10-CM | POA: Diagnosis not present

## 2023-07-23 DIAGNOSIS — E46 Unspecified protein-calorie malnutrition: Secondary | ICD-10-CM | POA: Diagnosis not present

## 2023-07-23 DIAGNOSIS — E559 Vitamin D deficiency, unspecified: Secondary | ICD-10-CM | POA: Diagnosis not present

## 2023-07-23 DIAGNOSIS — I10 Essential (primary) hypertension: Secondary | ICD-10-CM | POA: Diagnosis not present

## 2023-07-23 DIAGNOSIS — L89101 Pressure ulcer of unspecified part of back, stage 1: Secondary | ICD-10-CM | POA: Diagnosis not present

## 2023-07-24 DIAGNOSIS — M810 Age-related osteoporosis without current pathological fracture: Secondary | ICD-10-CM | POA: Diagnosis not present

## 2023-07-24 DIAGNOSIS — I10 Essential (primary) hypertension: Secondary | ICD-10-CM | POA: Diagnosis not present

## 2023-07-24 DIAGNOSIS — M1990 Unspecified osteoarthritis, unspecified site: Secondary | ICD-10-CM | POA: Diagnosis not present

## 2023-07-24 DIAGNOSIS — G2581 Restless legs syndrome: Secondary | ICD-10-CM | POA: Diagnosis not present

## 2023-07-24 DIAGNOSIS — G20A1 Parkinson's disease without dyskinesia, without mention of fluctuations: Secondary | ICD-10-CM | POA: Diagnosis not present

## 2023-07-24 DIAGNOSIS — E43 Unspecified severe protein-calorie malnutrition: Secondary | ICD-10-CM | POA: Diagnosis not present

## 2023-07-24 DIAGNOSIS — E876 Hypokalemia: Secondary | ICD-10-CM | POA: Diagnosis not present

## 2023-07-24 DIAGNOSIS — R1310 Dysphagia, unspecified: Secondary | ICD-10-CM | POA: Diagnosis not present

## 2023-07-24 DIAGNOSIS — F1320 Sedative, hypnotic or anxiolytic dependence, uncomplicated: Secondary | ICD-10-CM | POA: Diagnosis not present

## 2023-07-26 DIAGNOSIS — M810 Age-related osteoporosis without current pathological fracture: Secondary | ICD-10-CM | POA: Diagnosis not present

## 2023-07-26 DIAGNOSIS — M1990 Unspecified osteoarthritis, unspecified site: Secondary | ICD-10-CM | POA: Diagnosis not present

## 2023-07-26 DIAGNOSIS — G20A1 Parkinson's disease without dyskinesia, without mention of fluctuations: Secondary | ICD-10-CM | POA: Diagnosis not present

## 2023-07-26 DIAGNOSIS — F1320 Sedative, hypnotic or anxiolytic dependence, uncomplicated: Secondary | ICD-10-CM | POA: Diagnosis not present

## 2023-07-26 DIAGNOSIS — I10 Essential (primary) hypertension: Secondary | ICD-10-CM | POA: Diagnosis not present

## 2023-07-26 DIAGNOSIS — G2581 Restless legs syndrome: Secondary | ICD-10-CM | POA: Diagnosis not present

## 2023-07-26 DIAGNOSIS — E876 Hypokalemia: Secondary | ICD-10-CM | POA: Diagnosis not present

## 2023-07-26 DIAGNOSIS — R1310 Dysphagia, unspecified: Secondary | ICD-10-CM | POA: Diagnosis not present

## 2023-07-26 DIAGNOSIS — E43 Unspecified severe protein-calorie malnutrition: Secondary | ICD-10-CM | POA: Diagnosis not present

## 2023-07-30 DIAGNOSIS — R1310 Dysphagia, unspecified: Secondary | ICD-10-CM | POA: Diagnosis not present

## 2023-07-30 DIAGNOSIS — I10 Essential (primary) hypertension: Secondary | ICD-10-CM | POA: Diagnosis not present

## 2023-07-30 DIAGNOSIS — E876 Hypokalemia: Secondary | ICD-10-CM | POA: Diagnosis not present

## 2023-07-30 DIAGNOSIS — F1320 Sedative, hypnotic or anxiolytic dependence, uncomplicated: Secondary | ICD-10-CM | POA: Diagnosis not present

## 2023-07-30 DIAGNOSIS — M810 Age-related osteoporosis without current pathological fracture: Secondary | ICD-10-CM | POA: Diagnosis not present

## 2023-07-30 DIAGNOSIS — E43 Unspecified severe protein-calorie malnutrition: Secondary | ICD-10-CM | POA: Diagnosis not present

## 2023-07-30 DIAGNOSIS — G20A1 Parkinson's disease without dyskinesia, without mention of fluctuations: Secondary | ICD-10-CM | POA: Diagnosis not present

## 2023-07-30 DIAGNOSIS — M1990 Unspecified osteoarthritis, unspecified site: Secondary | ICD-10-CM | POA: Diagnosis not present

## 2023-07-30 DIAGNOSIS — G2581 Restless legs syndrome: Secondary | ICD-10-CM | POA: Diagnosis not present

## 2023-07-31 DIAGNOSIS — M810 Age-related osteoporosis without current pathological fracture: Secondary | ICD-10-CM | POA: Diagnosis not present

## 2023-07-31 DIAGNOSIS — E43 Unspecified severe protein-calorie malnutrition: Secondary | ICD-10-CM | POA: Diagnosis not present

## 2023-07-31 DIAGNOSIS — G2581 Restless legs syndrome: Secondary | ICD-10-CM | POA: Diagnosis not present

## 2023-07-31 DIAGNOSIS — M1990 Unspecified osteoarthritis, unspecified site: Secondary | ICD-10-CM | POA: Diagnosis not present

## 2023-07-31 DIAGNOSIS — R1310 Dysphagia, unspecified: Secondary | ICD-10-CM | POA: Diagnosis not present

## 2023-07-31 DIAGNOSIS — G20A1 Parkinson's disease without dyskinesia, without mention of fluctuations: Secondary | ICD-10-CM | POA: Diagnosis not present

## 2023-07-31 DIAGNOSIS — F1320 Sedative, hypnotic or anxiolytic dependence, uncomplicated: Secondary | ICD-10-CM | POA: Diagnosis not present

## 2023-07-31 DIAGNOSIS — E876 Hypokalemia: Secondary | ICD-10-CM | POA: Diagnosis not present

## 2023-07-31 DIAGNOSIS — I10 Essential (primary) hypertension: Secondary | ICD-10-CM | POA: Diagnosis not present

## 2023-08-01 DIAGNOSIS — G20A1 Parkinson's disease without dyskinesia, without mention of fluctuations: Secondary | ICD-10-CM | POA: Diagnosis not present

## 2023-08-01 DIAGNOSIS — G2581 Restless legs syndrome: Secondary | ICD-10-CM | POA: Diagnosis not present

## 2023-08-01 DIAGNOSIS — E876 Hypokalemia: Secondary | ICD-10-CM | POA: Diagnosis not present

## 2023-08-01 DIAGNOSIS — M1990 Unspecified osteoarthritis, unspecified site: Secondary | ICD-10-CM | POA: Diagnosis not present

## 2023-08-01 DIAGNOSIS — M810 Age-related osteoporosis without current pathological fracture: Secondary | ICD-10-CM | POA: Diagnosis not present

## 2023-08-01 DIAGNOSIS — E43 Unspecified severe protein-calorie malnutrition: Secondary | ICD-10-CM | POA: Diagnosis not present

## 2023-08-01 DIAGNOSIS — I10 Essential (primary) hypertension: Secondary | ICD-10-CM | POA: Diagnosis not present

## 2023-08-01 DIAGNOSIS — F1320 Sedative, hypnotic or anxiolytic dependence, uncomplicated: Secondary | ICD-10-CM | POA: Diagnosis not present

## 2023-08-01 DIAGNOSIS — R1310 Dysphagia, unspecified: Secondary | ICD-10-CM | POA: Diagnosis not present

## 2023-08-07 DIAGNOSIS — G2581 Restless legs syndrome: Secondary | ICD-10-CM | POA: Diagnosis not present

## 2023-08-07 DIAGNOSIS — R1310 Dysphagia, unspecified: Secondary | ICD-10-CM | POA: Diagnosis not present

## 2023-08-07 DIAGNOSIS — E43 Unspecified severe protein-calorie malnutrition: Secondary | ICD-10-CM | POA: Diagnosis not present

## 2023-08-07 DIAGNOSIS — M810 Age-related osteoporosis without current pathological fracture: Secondary | ICD-10-CM | POA: Diagnosis not present

## 2023-08-07 DIAGNOSIS — G20A1 Parkinson's disease without dyskinesia, without mention of fluctuations: Secondary | ICD-10-CM | POA: Diagnosis not present

## 2023-08-07 DIAGNOSIS — E876 Hypokalemia: Secondary | ICD-10-CM | POA: Diagnosis not present

## 2023-08-07 DIAGNOSIS — I10 Essential (primary) hypertension: Secondary | ICD-10-CM | POA: Diagnosis not present

## 2023-08-07 DIAGNOSIS — M1990 Unspecified osteoarthritis, unspecified site: Secondary | ICD-10-CM | POA: Diagnosis not present

## 2023-08-07 DIAGNOSIS — F1320 Sedative, hypnotic or anxiolytic dependence, uncomplicated: Secondary | ICD-10-CM | POA: Diagnosis not present

## 2023-08-09 DIAGNOSIS — G20A1 Parkinson's disease without dyskinesia, without mention of fluctuations: Secondary | ICD-10-CM | POA: Diagnosis not present

## 2023-08-09 DIAGNOSIS — G2581 Restless legs syndrome: Secondary | ICD-10-CM | POA: Diagnosis not present

## 2023-08-09 DIAGNOSIS — M1990 Unspecified osteoarthritis, unspecified site: Secondary | ICD-10-CM | POA: Diagnosis not present

## 2023-08-09 DIAGNOSIS — M810 Age-related osteoporosis without current pathological fracture: Secondary | ICD-10-CM | POA: Diagnosis not present

## 2023-08-09 DIAGNOSIS — I10 Essential (primary) hypertension: Secondary | ICD-10-CM | POA: Diagnosis not present

## 2023-08-09 DIAGNOSIS — E43 Unspecified severe protein-calorie malnutrition: Secondary | ICD-10-CM | POA: Diagnosis not present

## 2023-08-09 DIAGNOSIS — R1310 Dysphagia, unspecified: Secondary | ICD-10-CM | POA: Diagnosis not present

## 2023-08-09 DIAGNOSIS — E876 Hypokalemia: Secondary | ICD-10-CM | POA: Diagnosis not present

## 2023-08-09 DIAGNOSIS — F1320 Sedative, hypnotic or anxiolytic dependence, uncomplicated: Secondary | ICD-10-CM | POA: Diagnosis not present

## 2023-08-13 DIAGNOSIS — E876 Hypokalemia: Secondary | ICD-10-CM | POA: Diagnosis not present

## 2023-08-13 DIAGNOSIS — R1310 Dysphagia, unspecified: Secondary | ICD-10-CM | POA: Diagnosis not present

## 2023-08-13 DIAGNOSIS — G20A1 Parkinson's disease without dyskinesia, without mention of fluctuations: Secondary | ICD-10-CM | POA: Diagnosis not present

## 2023-08-13 DIAGNOSIS — I10 Essential (primary) hypertension: Secondary | ICD-10-CM | POA: Diagnosis not present

## 2023-08-13 DIAGNOSIS — G2581 Restless legs syndrome: Secondary | ICD-10-CM | POA: Diagnosis not present

## 2023-08-13 DIAGNOSIS — F1320 Sedative, hypnotic or anxiolytic dependence, uncomplicated: Secondary | ICD-10-CM | POA: Diagnosis not present

## 2023-08-13 DIAGNOSIS — E43 Unspecified severe protein-calorie malnutrition: Secondary | ICD-10-CM | POA: Diagnosis not present

## 2023-08-13 DIAGNOSIS — M1990 Unspecified osteoarthritis, unspecified site: Secondary | ICD-10-CM | POA: Diagnosis not present

## 2023-08-13 DIAGNOSIS — M810 Age-related osteoporosis without current pathological fracture: Secondary | ICD-10-CM | POA: Diagnosis not present

## 2023-08-14 DIAGNOSIS — E876 Hypokalemia: Secondary | ICD-10-CM | POA: Diagnosis not present

## 2023-08-14 DIAGNOSIS — F1320 Sedative, hypnotic or anxiolytic dependence, uncomplicated: Secondary | ICD-10-CM | POA: Diagnosis not present

## 2023-08-14 DIAGNOSIS — G2581 Restless legs syndrome: Secondary | ICD-10-CM | POA: Diagnosis not present

## 2023-08-14 DIAGNOSIS — M810 Age-related osteoporosis without current pathological fracture: Secondary | ICD-10-CM | POA: Diagnosis not present

## 2023-08-14 DIAGNOSIS — R1310 Dysphagia, unspecified: Secondary | ICD-10-CM | POA: Diagnosis not present

## 2023-08-14 DIAGNOSIS — M1990 Unspecified osteoarthritis, unspecified site: Secondary | ICD-10-CM | POA: Diagnosis not present

## 2023-08-14 DIAGNOSIS — I10 Essential (primary) hypertension: Secondary | ICD-10-CM | POA: Diagnosis not present

## 2023-08-14 DIAGNOSIS — E43 Unspecified severe protein-calorie malnutrition: Secondary | ICD-10-CM | POA: Diagnosis not present

## 2023-08-14 DIAGNOSIS — G20A1 Parkinson's disease without dyskinesia, without mention of fluctuations: Secondary | ICD-10-CM | POA: Diagnosis not present

## 2023-08-21 DIAGNOSIS — M810 Age-related osteoporosis without current pathological fracture: Secondary | ICD-10-CM | POA: Diagnosis not present

## 2023-08-21 DIAGNOSIS — R1310 Dysphagia, unspecified: Secondary | ICD-10-CM | POA: Diagnosis not present

## 2023-08-21 DIAGNOSIS — M6281 Muscle weakness (generalized): Secondary | ICD-10-CM | POA: Diagnosis not present

## 2023-08-21 DIAGNOSIS — G20A1 Parkinson's disease without dyskinesia, without mention of fluctuations: Secondary | ICD-10-CM | POA: Diagnosis not present

## 2023-08-21 DIAGNOSIS — F1320 Sedative, hypnotic or anxiolytic dependence, uncomplicated: Secondary | ICD-10-CM | POA: Diagnosis not present

## 2023-08-21 DIAGNOSIS — E876 Hypokalemia: Secondary | ICD-10-CM | POA: Diagnosis not present

## 2023-08-21 DIAGNOSIS — G2581 Restless legs syndrome: Secondary | ICD-10-CM | POA: Diagnosis not present

## 2023-08-21 DIAGNOSIS — I10 Essential (primary) hypertension: Secondary | ICD-10-CM | POA: Diagnosis not present

## 2023-08-21 DIAGNOSIS — M5430 Sciatica, unspecified side: Secondary | ICD-10-CM | POA: Diagnosis not present

## 2023-08-21 DIAGNOSIS — E43 Unspecified severe protein-calorie malnutrition: Secondary | ICD-10-CM | POA: Diagnosis not present

## 2023-08-21 DIAGNOSIS — M1990 Unspecified osteoarthritis, unspecified site: Secondary | ICD-10-CM | POA: Diagnosis not present

## 2023-08-22 DIAGNOSIS — Z515 Encounter for palliative care: Secondary | ICD-10-CM | POA: Diagnosis not present

## 2023-08-22 DIAGNOSIS — R634 Abnormal weight loss: Secondary | ICD-10-CM | POA: Diagnosis not present

## 2023-08-22 DIAGNOSIS — G20A1 Parkinson's disease without dyskinesia, without mention of fluctuations: Secondary | ICD-10-CM | POA: Diagnosis not present

## 2023-08-24 ENCOUNTER — Emergency Department (HOSPITAL_COMMUNITY): Payer: Medicare PPO

## 2023-08-24 ENCOUNTER — Other Ambulatory Visit: Payer: Self-pay

## 2023-08-24 ENCOUNTER — Encounter (HOSPITAL_COMMUNITY): Payer: Self-pay | Admitting: Emergency Medicine

## 2023-08-24 ENCOUNTER — Emergency Department (HOSPITAL_COMMUNITY)
Admission: EM | Admit: 2023-08-24 | Discharge: 2023-08-24 | Disposition: A | Discharge status: Home / Self Care | Payer: Medicare PPO | Attending: Emergency Medicine | Admitting: Emergency Medicine

## 2023-08-24 DIAGNOSIS — M5030 Other cervical disc degeneration, unspecified cervical region: Secondary | ICD-10-CM | POA: Diagnosis not present

## 2023-08-24 DIAGNOSIS — I10 Essential (primary) hypertension: Secondary | ICD-10-CM | POA: Insufficient documentation

## 2023-08-24 DIAGNOSIS — Y92002 Bathroom of unspecified non-institutional (private) residence single-family (private) house as the place of occurrence of the external cause: Secondary | ICD-10-CM | POA: Insufficient documentation

## 2023-08-24 DIAGNOSIS — R4781 Slurred speech: Secondary | ICD-10-CM | POA: Diagnosis not present

## 2023-08-24 DIAGNOSIS — M4802 Spinal stenosis, cervical region: Secondary | ICD-10-CM | POA: Insufficient documentation

## 2023-08-24 DIAGNOSIS — R29818 Other symptoms and signs involving the nervous system: Secondary | ICD-10-CM | POA: Diagnosis not present

## 2023-08-24 DIAGNOSIS — W19XXXA Unspecified fall, initial encounter: Secondary | ICD-10-CM | POA: Diagnosis not present

## 2023-08-24 DIAGNOSIS — I6782 Cerebral ischemia: Secondary | ICD-10-CM | POA: Insufficient documentation

## 2023-08-24 DIAGNOSIS — M50321 Other cervical disc degeneration at C4-C5 level: Secondary | ICD-10-CM | POA: Diagnosis not present

## 2023-08-24 DIAGNOSIS — Z043 Encounter for examination and observation following other accident: Secondary | ICD-10-CM | POA: Diagnosis not present

## 2023-08-24 DIAGNOSIS — S199XXA Unspecified injury of neck, initial encounter: Secondary | ICD-10-CM | POA: Diagnosis not present

## 2023-08-24 DIAGNOSIS — Z7982 Long term (current) use of aspirin: Secondary | ICD-10-CM | POA: Insufficient documentation

## 2023-08-24 DIAGNOSIS — R410 Disorientation, unspecified: Secondary | ICD-10-CM | POA: Diagnosis not present

## 2023-08-24 DIAGNOSIS — G20C Parkinsonism, unspecified: Secondary | ICD-10-CM | POA: Insufficient documentation

## 2023-08-24 DIAGNOSIS — M810 Age-related osteoporosis without current pathological fracture: Secondary | ICD-10-CM | POA: Diagnosis not present

## 2023-08-24 DIAGNOSIS — W1830XA Fall on same level, unspecified, initial encounter: Secondary | ICD-10-CM | POA: Insufficient documentation

## 2023-08-24 DIAGNOSIS — Z7401 Bed confinement status: Secondary | ICD-10-CM | POA: Diagnosis not present

## 2023-08-24 DIAGNOSIS — R531 Weakness: Secondary | ICD-10-CM | POA: Diagnosis not present

## 2023-08-24 DIAGNOSIS — R471 Dysarthria and anarthria: Secondary | ICD-10-CM | POA: Insufficient documentation

## 2023-08-24 DIAGNOSIS — R4701 Aphasia: Secondary | ICD-10-CM | POA: Insufficient documentation

## 2023-08-24 DIAGNOSIS — S0990XA Unspecified injury of head, initial encounter: Secondary | ICD-10-CM | POA: Diagnosis not present

## 2023-08-24 LAB — URINALYSIS, ROUTINE W REFLEX MICROSCOPIC
Bilirubin Urine: NEGATIVE
Glucose, UA: NEGATIVE mg/dL
Hgb urine dipstick: NEGATIVE
Ketones, ur: 5 mg/dL — AB
Leukocytes,Ua: NEGATIVE
Nitrite: NEGATIVE
Protein, ur: NEGATIVE mg/dL
Specific Gravity, Urine: 1.009 (ref 1.005–1.030)
pH: 7 (ref 5.0–8.0)

## 2023-08-24 LAB — PROTIME-INR
INR: 1 (ref 0.8–1.2)
Prothrombin Time: 13.2 s (ref 11.4–15.2)

## 2023-08-24 LAB — COMPREHENSIVE METABOLIC PANEL
ALT: <5 U/L (ref 0–44)
AST: 18 U/L (ref 15–41)
Albumin: 3.8 g/dL (ref 3.5–5.0)
Alkaline Phosphatase: 66 U/L (ref 38–126)
Anion gap: 9 (ref 5–15)
BUN: 14 mg/dL (ref 8–23)
CO2: 24 mmol/L (ref 22–32)
Calcium: 9.5 mg/dL (ref 8.9–10.3)
Chloride: 108 mmol/L (ref 98–111)
Creatinine, Ser: 0.92 mg/dL (ref 0.44–1.00)
GFR, Estimated: >60 mL/min (ref >60)
Glucose, Bld: 99 mg/dL (ref 70–99)
Potassium: 3.9 mmol/L (ref 3.5–5.1)
Sodium: 141 mmol/L (ref 135–145)
Total Bilirubin: 0.5 mg/dL (ref <1.2)
Total Protein: 6.4 g/dL — ABNORMAL LOW (ref 6.5–8.1)

## 2023-08-24 LAB — I-STAT CHEM 8, ED
BUN: 15 mg/dL (ref 8–23)
Calcium, Ion: 1.25 mmol/L (ref 1.15–1.40)
Chloride: 107 mmol/L (ref 98–111)
Creatinine, Ser: 0.9 mg/dL (ref 0.44–1.00)
Glucose, Bld: 97 mg/dL (ref 70–99)
HCT: 37 % (ref 36.0–46.0)
Hemoglobin: 12.6 g/dL (ref 12.0–15.0)
Potassium: 3.8 mmol/L (ref 3.5–5.1)
Sodium: 143 mmol/L (ref 135–145)
TCO2: 25 mmol/L (ref 22–32)

## 2023-08-24 LAB — DIFFERENTIAL
Abs Immature Granulocytes: 0.04 10*3/uL (ref 0.00–0.07)
Basophils Absolute: 0 10*3/uL (ref 0.0–0.1)
Basophils Relative: 0 %
Eosinophils Absolute: 0.2 10*3/uL (ref 0.0–0.5)
Eosinophils Relative: 2 %
Immature Granulocytes: 0 %
Lymphocytes Relative: 14 %
Lymphs Abs: 1.2 10*3/uL (ref 0.7–4.0)
Monocytes Absolute: 0.4 10*3/uL (ref 0.1–1.0)
Monocytes Relative: 4 %
Neutro Abs: 7.1 10*3/uL (ref 1.7–7.7)
Neutrophils Relative %: 80 %

## 2023-08-24 LAB — RAPID URINE DRUG SCREEN, HOSP PERFORMED
Amphetamines: NOT DETECTED
Barbiturates: NOT DETECTED
Benzodiazepines: POSITIVE — AB
Cocaine: NOT DETECTED
Opiates: NOT DETECTED
Tetrahydrocannabinol: NOT DETECTED

## 2023-08-24 LAB — CBC
HCT: 38.5 % (ref 36.0–46.0)
Hemoglobin: 12.1 g/dL (ref 12.0–15.0)
MCH: 29.7 pg (ref 26.0–34.0)
MCHC: 31.4 g/dL (ref 30.0–36.0)
MCV: 94.6 fL (ref 80.0–100.0)
Platelets: 199 10*3/uL (ref 150–400)
RBC: 4.07 MIL/uL (ref 3.87–5.11)
RDW: 13.6 % (ref 11.5–15.5)
WBC: 9 10*3/uL (ref 4.0–10.5)
nRBC: 0 % (ref 0.0–0.2)

## 2023-08-24 LAB — ETHANOL: Alcohol, Ethyl (B): <10 mg/dL (ref <10)

## 2023-08-24 LAB — APTT: aPTT: 24 s (ref 24–36)

## 2023-08-24 LAB — CBG MONITORING, ED: Glucose-Capillary: 85 mg/dL (ref 70–99)

## 2023-08-24 MED ORDER — LORAZEPAM 2 MG/ML IJ SOLN
0.5000 mg | INTRAMUSCULAR | Status: AC
  Administered 2023-08-24: 0.5 mg via INTRAVENOUS
  Filled 2023-08-24: qty 1

## 2023-08-24 NOTE — Consult Note (Signed)
NEUROLOGY CONSULT NOTE   Date of service: August 24, 2023 Patient Name: Hannah Fisher MRN:  [id number] DOB:  Feb 04, 1951 Chief Complaint: "stroke code, fall, confused, dysarthric speech" Requesting Provider: Rondel Baton, MD  History of Present Illness  Hannah Fisher is a 72 y.o. female with hx of HTN, kidney stones, osteoporosis, parkinsons, hypertension who lives in a facility and had a fall. Daughter noticed patient on camera, walking to the bathroom. She then saw her leaning against the fall. She called EMS and checked on the camera to find patient on the floor. She was put in a C collar and noted to have confusion/aphasia and significantly slurred speech and was brought in as a code stroke.  LKW: 0815 Modified rankin score: 3-Moderate disability-requires help but walks WITHOUT assistance IV Thrombolysis: not offered, too mild to treat. EVT: not offered, too mild to treat and low suspicion for stroke. NIHSS components Score: Comment  1a Level of Conscious 0[x]  1[]  2[]  3[]      1b LOC Questions 0[x]  1[]  2[]       1c LOC Commands 0[x]  1[]  2[]       2 Best Gaze 0[x]  1[]  2[]       3 Visual 0[x]  1[]  2[]  3[]      4 Facial Palsy 0[x]  1[]  2[]  3[]      5a Motor Arm - left 0[x]  1[]  2[]  3[]  4[]  UN[]    5b Motor Arm - Right 0[x]  1[]  2[]  3[]  4[]  UN[]    6a Motor Leg - Left 0[x]  1[]  2[]  3[]  4[]  UN[]    6b Motor Leg - Right 0[x]  1[]  2[]  3[]  4[]  UN[]    7 Limb Ataxia 0[x]  1[]  2[]  3[]  UN[]     8 Sensory 0[x]  1[]  2[]  UN[]      9 Best Language 0[x]  1[]  2[]  3[]      10 Dysarthria 0[]  1[x]  2[]  UN[]      11 Extinct. and Inattention 0[x]  1[]  2[]       TOTAL: 1       ROS  Comprehensive ROS performed and pertinent positives documented in HPI   Past History   Past Medical History:  Diagnosis Date   Arthritis    hands, knees   Deafness    left ear sue to shingles   Hypertension    Kidney stones    Osteoporosis 2009   -2.6   Shingles     Past Surgical History:  Procedure Laterality Date    MASS EXCISION Right 05/30/2018   Procedure: EXCISION MASS RIGHT SMALL FINGER;  Surgeon: Cindee Salt, MD;  Location: [facility];  Service: Orthopedics;  Laterality: Right;   TONSILLECTOMY     VAGINAL HYSTERECTOMY  1992    Family History: Family History  Problem Relation Age of Onset   Hypertension Mother    Stroke Mother    Hypertension Sister    Hypertension Brother    Breast cancer Neg Hx     Social History  reports that she has never smoked. She has never used smokeless tobacco. She reports that she does not currently use alcohol. She reports that she does not use drugs.  Allergies  Allergen Reactions   Prednisone Shortness Of Breath, Swelling and Other (See Comments)    Tightness in chest, trouble breathing, edema, and chest pressure   Penicillins Hives, Swelling and Other (See Comments)    Edema and itching, no hospitalization or airway required    Beta Adrenergic Blockers Nausea Only and Other (See Comments)    Made the patient feel badly  Sulfa Antibiotics Rash    Medications  No current facility-administered medications for this encounter.  Current Outpatient Medications:    acetaminophen (TYLENOL) 325 MG tablet, Take 2 tablets (650 mg total) by mouth every 6 (six) hours as needed for mild pain (or Fever >/= 101)., Disp: , Rfl:    ALPRAZolam (XANAX) 0.25 MG tablet, Take 1 tablet (0.25 mg total) by mouth 2 (two) times daily., Disp: 10 tablet, Rfl: 0   aspirin 81 MG EC tablet, Take 81 mg by mouth daily., Disp: , Rfl:    bisacodyl (DULCOLAX) 10 MG suppository, Place 1 suppository (10 mg total) rectally daily as needed for severe constipation., Disp: 12 suppository, Rfl: 0   calcium carbonate (CALCIUM 600) 600 MG TABS tablet, Take 600 mg by mouth daily., Disp: , Rfl:    carbidopa-levodopa (SINEMET IR) 25-100 MG tablet, Take 1.5-2 tablets by mouth See admin instructions. Take 2 tablets by mouth at 9 AM & 12 NOON, then 1.5 tablets at 7 PM, Disp: , Rfl:    folic  acid (FOLVITE) 800 MCG tablet, Take 800 mcg by mouth daily., Disp: , Rfl:    lisinopril (ZESTRIL) 2.5 MG tablet, Take 2.5 mg by mouth daily., Disp: , Rfl:    Magnesium Glycinate 100 MG CAPS, Take 200 mg by mouth daily., Disp: , Rfl:    mirtazapine (REMERON) 7.5 MG tablet, Take 7.5 mg by mouth at bedtime., Disp: , Rfl:    Multiple Vitamin (MULTIVITAMIN WITH MINERALS) TABS tablet, Take 1 tablet by mouth daily., Disp: , Rfl:    polyethylene glycol (MIRALAX / GLYCOLAX) 17 g packet, Take 17 g by mouth daily., Disp: 14 each, Rfl: 0   rOPINIRole (REQUIP) 0.25 MG tablet, Take 0.25 mg by mouth every evening., Disp: , Rfl:    thiamine (VITAMIN B1) 100 MG tablet, Take 200 mg by mouth daily., Disp: , Rfl:   Vitals   Vitals:   08/24/23 1100 08/24/23 1129 08/24/23 1130  BP:   (!) 143/92  Pulse:  77 76  Resp:  18 17  Temp:   98.6 F (37 C)  SpO2:  100% 100%  Weight: 43.4 kg    Height: 5\' 1"  (1.549 m)      Body mass index is 18.08 kg/m.  Physical Exam   General: Laying comfortably in bed; in no acute distress. Old and frail. HENT: Normal oropharynx and mucosa. Normal external appearance of ears and nose.  Neck: Supple, no pain or tenderness  CV: No JVD. No peripheral edema.  Pulmonary: Symmetric Chest rise. Normal respiratory effort.  Abdomen: Soft to touch, non-tender.  Ext: No cyanosis, edema, or deformity  Skin: No rash. Normal palpation of skin.   Musculoskeletal: Normal digits and nails by inspection. No clubbing.   Neurologic Examination  Mental status/Cognition: Alert, oriented to self, place, month and year, good attention.  Speech/language: soft and slurred speech, speech trails off. Fluent, comprehension intact, object naming intact, repetition intact.  Cranial nerves:   CN II Pupils equal and reactive to light, no VF deficits    CN III,IV,VI EOM intact, no gaze preference or deviation, no nystagmus    CN V normal sensation in V1, V2, and V3 segments bilaterally    CN VII no  asymmetry, no nasolabial fold flattening    CN VIII normal hearing to speech    CN IX & X normal palatal elevation, no uvular deviation    CN XI 5/5 head turn and 5/5 shoulder shrug bilaterally    CN XII  midline tongue protrusion    Motor:  Muscle bulk: poor, tone slightly increased, pronator drift none tremor none Mvmt Root Nerve  Muscle Right Left Comments  SA C5/6 Ax Deltoid 4+ 4+   EF C5/6 Mc Biceps     EE C6/7/8 Rad Triceps     WF C6/7 Med FCR     WE C7/8 PIN ECU     F Ab C8/T1 U ADM/FDI 4+ 4+   HF L1/2/3 Fem Illopsoas 4+ 4+   KE L2/3/4 Fem Quad     DF L4/5 D Peron Tib Ant     PF S1/2 Tibial Grc/Sol      Sensation:  Light touch Intact throughout   Pin prick    Temperature    Vibration   Proprioception    Coordination/Complex Motor:  - Finger to Nose intact BL - Heel to shin intact BL - Rapid alternating movement are slowed. - Gait: deferred. Uses walker and is frail. Deferred for her safety.    Labs/Imaging/Neurodiagnostic studies   CBC:  Recent Labs  Lab 09/14/2023 1112 09/14/23 1118  WBC 9.0  --   NEUTROABS 7.1  --   HGB 12.1 12.6  HCT 38.5 37.0  MCV 94.6  --   PLT 199  --    Basic Metabolic Panel:  Lab Results  Component Value Date   NA 143 09/14/2023   K 3.8 September 14, 2023   CO2 24 09/14/2023   GLUCOSE 97 September 14, 2023   BUN 15 September 14, 2023   CREATININE 0.90 September 14, 2023   CALCIUM 9.5 2023-09-14   GFRNONAA >60 14-Sep-2023   GFRAA 88 02/02/2020   Lipid Panel:  Lab Results  Component Value Date   LDLCALC 87 03/03/2014   HgbA1c:  Lab Results  Component Value Date   HGBA1C 5.4 01/01/2021   Urine Drug Screen: No results found for: "LABOPIA", "COCAINSCRNUR", "LABBENZ", "AMPHETMU", "THCU", "LABBARB"  Alcohol Level     Component Value Date/Time   ETH <10 09/14/2023 1112   INR  Lab Results  Component Value Date   INR 1.0 05/22/2023   APTT  Lab Results  Component Value Date   APTT 29 05/22/2023   AED levels: No results found for: "PHENYTOIN",  "ZONISAMIDE", "LAMOTRIGINE", "LEVETIRACETA"  CT Head without contrast(Personally reviewed): CTH was negative for a large hypodensity concerning for a large territory infarct or hyperdensity concerning for an ICH  MRI Brain: pending  ASSESSMENT   SHAVONDRA MACADAMS is a 72 y.o. female ith hx of HTN, kidney stones, osteoporosis, parkinsons, hypertension who lives in a facility and had a fall. Brought in for confusion/aphasia and slurred speech. On arrival, no confusion/aphasia but still significantly slurred speech. Her mouth is dry which maybe contributing to her slurred speech and speech trails off which can be seen in parkinsons. Confusion/aphasia can be explained by the potential fall. Would still recommend an MRI Brain to rule out stroke as a stroke can very well cause acute onset aphasia and slurred speech.  RECOMMENDATIONS  - MRI Brain w/o C and if negative for stroke, no further inpatient neurologic workup. ______________________________________________________________________    Welton Flakes, MD [facility]

## 2023-08-24 NOTE — Discharge Instructions (Signed)
You were seen in the emergency department for your fall.  You had a head CT and an MRI which did not show any injury or stroke.  Follow-up with your primary doctor in 2 to 3 days.  Return to the emergency department for any concerning symptoms

## 2023-08-24 NOTE — Code Documentation (Signed)
Stroke Response Nurse Documentation Code Documentation  BRENIYA CABELLO is a 72 y.o. female arriving to [facility]  via [facility] on 11/22 with past medical hx of Parkinson disease, hld. On No antithrombotic. Code stroke was activated by EMS.   Patient from [facility] where she was LKW at [phone] and now complaining of slurred speech. Patient was observed on a camera to be walking with a walker and slump over into a wall, being found on the floor by EMS. She was aphasic with slurred speech at that time, but on arrival to the ED has slurred speech only.   Stroke team at the bedside on patient arrival. Labs drawn and patient cleared for CT by Dr. Particia Nearing. Patient to CT with team. NIHSS 1, see documentation for details and code stroke times. Patient with dysarthria  on exam. The following imaging was completed:  CT Head. Patient is not a candidate for IV Thrombolytic due to being to mild to treat. Patient is not a candidate for IR due to no LVO suspected per MD.   Care Plan: q30 NIHSS and vitals until 1245, then q2.   Bedside handoff with ED RN Josh.    Pearlie Oyster  Stroke Response RN

## 2023-08-24 NOTE — ED Provider Notes (Signed)
[place] EMERGENCY DEPARTMENT AT [facility] Provider Note   CSN: [id number] Arrival date & time: 08/24/23  1104  An emergency department physician performed an initial assessment on this suspected stroke patient at 51.  History  Chief Complaint  Patient presents with   Code Stroke    Hannah Fisher is a 72 y.o. female.  72 yo F with hx of parkinson's disease who presented after a fall. 930 she was leaning against the wall when trying to walk to the bathroom. Staff found her and she fell in the bathroom on her L side. Does have a history of this with her parkinson's. Has seemed a little weaker recently in the past few weeks. Unsure if she is eating well or staying hydrated. Does live at [facility]. Was feeling sick and didn't want to eat anything. Was feeling nauseous. Has been more confused recently. Mental status has declined this year. Typically is AAOx1 (doesn't know date or year or place sometimes). Typically has 4 hour shift during the day plus overnight supervision. Someone manages her medications. Did report some slurred speech and EMS activated a code stroke. Is already getting PT/OT/SLP at her facility.        Home Medications Prior to Admission medications   Medication Sig Start Date End Date Taking? Authorizing Provider  acetaminophen (TYLENOL) 325 MG tablet Take 2 tablets (650 mg total) by mouth every 6 (six) hours as needed for mild pain (or Fever >/= 101). 05/31/23   Lonia Blood, MD  ALPRAZolam Prudy Feeler) 0.25 MG tablet Take 1 tablet (0.25 mg total) by mouth 2 (two) times daily. 05/31/23   Lonia Blood, MD  aspirin 81 MG EC tablet Take 81 mg by mouth daily.    [provider]  bisacodyl (DULCOLAX) 10 MG suppository Place 1 suppository (10 mg total) rectally daily as needed for severe constipation. 05/31/23   Lonia Blood, MD  calcium carbonate (CALCIUM 600) 600 MG TABS tablet Take 600 mg by mouth daily.     [provider]  carbidopa-levodopa (SINEMET IR) 25-100 MG tablet Take 1.5-2 tablets by mouth See admin instructions. Take 2 tablets by mouth at 9 AM & 12 NOON, then 1.5 tablets at 7 PM 09/18/22   [provider]  folic acid (FOLVITE) 800 MCG tablet Take 800 mcg by mouth daily.    [provider]  lisinopril (ZESTRIL) 2.5 MG tablet Take 2.5 mg by mouth daily.    [provider]  Magnesium Glycinate 100 MG CAPS Take 200 mg by mouth daily.    [provider]  mirtazapine (REMERON) 7.5 MG tablet Take 7.5 mg by mouth at bedtime.    [provider]  Multiple Vitamin (MULTIVITAMIN WITH MINERALS) TABS tablet Take 1 tablet by mouth daily. 05/31/23   Lonia Blood, MD  polyethylene glycol (MIRALAX / GLYCOLAX) 17 g packet Take 17 g by mouth daily. 01/05/21   Erick Blinks, MD  rOPINIRole (REQUIP) 0.25 MG tablet Take 0.25 mg by mouth every evening.    [provider]  thiamine (VITAMIN B1) 100 MG tablet Take 200 mg by mouth daily.    [provider]      Allergies    Prednisone, Penicillins, Beta adrenergic blockers, and Sulfa antibiotics    Review of Systems   Review of Systems  Physical Exam Updated Vital Signs BP (!) 168/109 (BP Location: Right Arm)   Pulse 91   Temp 98.2 F (36.8 C) (Oral)  Resp 14   Ht 5\' 1"  (1.549 m)   Wt 43.4 kg   LMP 10/02/1990   SpO2 98%   BMI 18.08 kg/m  Physical Exam Constitutional:      General: She is not in acute distress.    Appearance: Normal appearance. She is not ill-appearing.  HENT:     Head: Normocephalic and atraumatic.     Right Ear: External ear normal.     Left Ear: External ear normal.     Mouth/Throat:     Mouth: Mucous membranes are moist.     Pharynx: Oropharynx is clear.  Eyes:     Extraocular Movements: Extraocular movements intact.     Conjunctiva/sclera: Conjunctivae normal.     Pupils: Pupils are equal, round, and reactive to light.  Neck:     Comments:  No C-spine midline tenderness to palpation Cardiovascular:     Rate and Rhythm: Normal rate and regular rhythm.     Pulses: Normal pulses.     Heart sounds: Normal heart sounds.  Pulmonary:     Effort: Pulmonary effort is normal. No respiratory distress.     Breath sounds: Normal breath sounds.  Abdominal:     General: Abdomen is flat.     Palpations: Abdomen is soft.     Tenderness: There is no abdominal tenderness. There is no guarding.  Musculoskeletal:        General: No deformity. Normal range of motion.     Cervical back: No rigidity or tenderness.     Comments: No tenderness to palpation of midline thoracic or lumbar spine.  No step-offs palpated.  No tenderness to palpation of chest wall.  No bruising noted.  No tenderness to palpation of bilateral clavicles.  No tenderness to palpation, bruising, or deformities noted of bilateral shoulders, elbows, wrists, hips, knees, or ankles.  Neurological:     Mental Status: She is alert.     Comments: MENTAL STATUS: AAOx3 CRANIAL NERVES: II: Pupils equal and reactive 5 mm BL, no RAPD, no VF deficits III, IV, VI: EOM intact, no gaze preference or deviation, no nystagmus. V: normal sensation to light touch in V1, V2, and V3 segments bilaterally VII: no facial weakness or asymmetry, no nasolabial fold flattening VIII: normal hearing to speech and finger friction IX, X: normal palatal elevation, no uvular deviation XI: 5/5 head turn and 5/5 shoulder shrug bilaterally XII: midline tongue protrusion MOTOR: 5/5 strength in R shoulder flexion, elbow flexion and extension, and grip strength. 5/5 strength in L shoulder flexion, elbow flexion and extension, and grip strength.  5/5 strength in R hip and knee flexion, knee extension, ankle plantar and dorsiflexion. 5/5 strength in L hip and knee flexion, knee extension, ankle plantar and dorsiflexion. SENSORY: Normal sensation to light touch in all extremities COORD: Normal finger to nose and  heel to shin, no tremor, no dysmetria No aphasia     ED Results / Procedures / Treatments   Labs (all labs ordered are listed, but only abnormal results are displayed) Labs Reviewed  COMPREHENSIVE METABOLIC PANEL - Abnormal; Notable for the following components:      Result Value   Total Protein 6.4 (*)    All other components within normal limits  RAPID URINE DRUG SCREEN, HOSP PERFORMED - Abnormal; Notable for the following components:   Benzodiazepines POSITIVE (*)    All other components within normal limits  URINALYSIS, ROUTINE W REFLEX MICROSCOPIC - Abnormal; Notable for the following components:   Ketones, ur 5 (*)  All other components within normal limits  ETHANOL  PROTIME-INR  APTT  CBC  DIFFERENTIAL  CBG MONITORING, ED  I-STAT CHEM 8, ED    EKG EKG Interpretation Date/Time:  Friday August 24 2023 11:44:39 EST Ventricular Rate:  78 PR Interval:  146 QRS Duration:  82 QT Interval:  380 QTC Calculation: 433 R Axis:   66  Text Interpretation: Sinus rhythm Confirmed by Vonita Moss [phone]) on 08/24/2023 2:07:13 PM  Radiology MR BRAIN WO CONTRAST  Result Date: 08/24/2023 CLINICAL DATA:  Dysarthria.  Fall. EXAM: MRI HEAD WITHOUT CONTRAST TECHNIQUE: Multiplanar, multiecho pulse sequences of the brain and surrounding structures were obtained without intravenous contrast. COMPARISON:  Head CT 08/24/2023 and MRI 12/29/2020 FINDINGS: Brain: There is no evidence of an acute infarct, intracranial hemorrhage, mass, midline shift, or extra-axial fluid collection. A punctate focus of T2 shine through in the left parietal white matter on diffusion weighted imaging is unchanged from the prior MRI. T2 hyperintensities in the cerebral white matter are unchanged and nonspecific but compatible with mild chronic small vessel ischemic disease. Cerebral volume is normal for age with normal ventricular size. Vascular: Major intracranial vascular flow voids are preserved. Skull and  upper cervical spine: Unremarkable bone marrow signal. Sinuses/Orbits: Unremarkable orbits. No significant inflammatory changes in the paranasal sinuses. Clear mastoid air cells. Other: None. IMPRESSION: 1. No acute intracranial abnormality. 2. Mild chronic small vessel ischemic disease. Electronically Signed   By: Sebastian Ache M.D.   On: 08/24/2023 14:59   DG Chest Portable 1 View  Result Date: 08/24/2023 CLINICAL DATA:  Fall. EXAM: PORTABLE CHEST 1 VIEW COMPARISON:  May 22, 2023. FINDINGS: The heart size and mediastinal contours are within normal limits. Both lungs are clear. The visualized skeletal structures are unremarkable. IMPRESSION: No active disease. Electronically Signed   By: Lupita Raider M.D.   On: 08/24/2023 13:51   DG Pelvis Portable  Result Date: 08/24/2023 CLINICAL DATA:  Fall. EXAM: PORTABLE PELVIS 1-2 VIEWS COMPARISON:  December 29, 2020. FINDINGS: There is no evidence of pelvic fracture or diastasis. No pelvic bone lesions are seen. IMPRESSION: Negative. Electronically Signed   By: Lupita Raider M.D.   On: 08/24/2023 13:50   CT CERVICAL SPINE WO CONTRAST  Result Date: 08/24/2023 CLINICAL DATA:  Neck trauma (Age >= 65y).  Fall. EXAM: CT CERVICAL SPINE WITHOUT CONTRAST TECHNIQUE: Multidetector CT imaging of the cervical spine was performed without intravenous contrast. Multiplanar CT image reconstructions were also generated. RADIATION DOSE REDUCTION: This exam was performed according to the departmental dose-optimization program which includes automated exposure control, adjustment of the mA and/or kV according to patient size and/or use of iterative reconstruction technique. COMPARISON:  CT cervical spine 02/19/2023 FINDINGS: Alignment: Cervical spine straightening.  No significant listhesis. Skull base and vertebrae: No acute fracture or suspicious osseous lesion. Soft tissues and spinal canal: No prevertebral fluid or swelling. No visible canal hematoma. Disc levels: Overall  mild disc degeneration for age, greatest at C4-5 and C5-6. No evidence of high-grade spinal canal stenosis. Moderate bilateral neural foraminal stenosis at C4-5. Upper chest: Mild apical lung scarring. Other: None. IMPRESSION: No acute cervical spine fracture. Electronically Signed   By: Sebastian Ache M.D.   On: 08/24/2023 11:33   CT HEAD CODE STROKE WO CONTRAST  Result Date: 08/24/2023 CLINICAL DATA:  Code stroke.  Neuro deficit, acute, stroke suspected EXAM: CT HEAD WITHOUT CONTRAST TECHNIQUE: Contiguous axial images were obtained from the base of the skull through the vertex without intravenous contrast.  RADIATION DOSE REDUCTION: This exam was performed according to the departmental dose-optimization program which includes automated exposure control, adjustment of the mA and/or kV according to patient size and/or use of iterative reconstruction technique. COMPARISON:  CT head 05/22/2023. FINDINGS: Brain: No evidence of acute large vascular territory infarction, hemorrhage, hydrocephalus, extra-axial collection or mass lesion/mass effect. Vascular: No hyperdense vessel. Skull: No acute fracture. Sinuses/Orbits: Clear sinuses.  No acute orbital findings. Other: No mastoid effusions. ASPECTS [facility] Stroke Program Early CT Score) total score (0-10 with 10 being normal): 10. IMPRESSION: No evidence of acute intracranial abnormality. ASPECTS is 10. Code stroke imaging results were communicated on 08/24/2023 at 11:28 am to provider Dr. Derry Lory via secure text paging. Electronically Signed   By: Feliberto Harts M.D.   On: 08/24/2023 11:29    Procedures Procedures    Medications Ordered in ED Medications  LORazepam (ATIVAN) injection 0.5 mg (0.5 mg Intravenous Given 08/24/23 1400)    ED Course/ Medical Decision Making/ A&P                                 Medical Decision Making Amount and/or Complexity of Data Reviewed Labs: ordered. Radiology: ordered.  Risk Prescription drug  management.   JAKILA WENZELL is a 72 y.o. female with comorbidities that complicate the patient evaluation including history of Parkinson's who presented after a fall.    Initial Ddx:  Mechanical fall, stroke, Parkinson's  MDM/Course:  Patient was seen by her daughter on camera when she got weak and leaned into the wall.  Appeared to have had a head strike.  Does have Parkinson's and has been getting weaker than usual recently.  Code stroke was activated in the field because of her speech changes.  On arrival neurology (Dr Derry Lory) reported she had some dysarthria which had resolved on my evaluation.  No other focal neurologic deficits.  No severe signs of trauma.  She did have a CT head and CT C-spine without abnormality.  Chest and pelvis x-ray without fracture.  Neurology recommended MRI of the brain which was obtained did not show acute evidence of stroke.  Discussed the case with the patient's daughter and it appears that she has PT and OT at home with good supervision at her facility.  Was able to walk in the emergency department with the assistance of a walker.  Will discharge her back to her facility and her follow-up with her primary doctor in several days.   This patient presents to the ED for concern of complaints listed in HPI, this involves an extensive number of treatment options, and is a complaint that carries with it a high risk of complications and morbidity. Disposition including potential need for admission considered.   Dispo: DC to Facility  Additional history obtained from daughter Records reviewed Outpatient Clinic Notes The following labs were independently interpreted: Chemistry and show no acute abnormality I independently reviewed the following imaging with scope of interpretation limited to determining acute life threatening conditions related to emergency care: CT Head and agree with the radiologist interpretation with the following exceptions: none I personally  reviewed and interpreted cardiac monitoring: normal sinus rhythm  I personally reviewed and interpreted the pt's EKG: see above for interpretation  I have reviewed the patients home medications and made adjustments as needed Consults: Neurology Social Determinants of health:  Elderly  Portions of this note were generated with Scientist, clinical (histocompatibility and immunogenetics). Dictation errors may occur  despite best attempts at proofreading.    CRITICAL CARE Performed by: Rondel Baton   Total critical care time: 30 minutes  Critical care time was exclusive of separately billable procedures and treating other patients.  Critical care was necessary to treat or prevent imminent or life-threatening deterioration.  Critical care was time spent personally by me on the following activities: development of treatment plan with patient and/or surrogate as well as nursing, discussions with consultants, evaluation of patient's response to treatment, examination of patient, obtaining history from patient or surrogate, ordering and performing treatments and interventions, ordering and review of laboratory studies, ordering and review of radiographic studies, pulse oximetry and re-evaluation of patient's condition.   Final Clinical Impression(s) / ED Diagnoses Final diagnoses:  Fall, initial encounter    Rx / DC Orders ED Discharge Orders     None         Rondel Baton, MD 08/24/23 [phone]

## 2023-08-24 NOTE — ED Triage Notes (Addendum)
Pt BIB [facility] as a Code Stroke from [facility]..  Per EMS, Daughter visualized pt on a camera walking to bathroom with walker. She leaned against wall looking unsteady,  Daughter called 911 and looked back at camera observing pt on floor.  It is unclear how hard pt fell or whether she hit head.  Pt is not on a thinner.  EMS endorses that pt stated she felt week in BLE prior to falling.  PT did have kidney infection in Sept. Resulting in weakness with fall.   LKW 8:15, Fall approx. 10 am.  EMS endorses Aphasia and dysarthria on scene.  On arrival aphapsia had resolved.  No other noted deficits.  Pt A*O.  120/82 97% HR 82 CBG 192

## 2023-08-24 NOTE — ED Notes (Signed)
Patient resting quietly in bed at this time

## 2023-08-24 NOTE — ED Notes (Signed)
Patient transported to MRI 

## 2023-08-27 DIAGNOSIS — E876 Hypokalemia: Secondary | ICD-10-CM | POA: Diagnosis not present

## 2023-08-27 DIAGNOSIS — G2581 Restless legs syndrome: Secondary | ICD-10-CM | POA: Diagnosis not present

## 2023-08-27 DIAGNOSIS — M1990 Unspecified osteoarthritis, unspecified site: Secondary | ICD-10-CM | POA: Diagnosis not present

## 2023-08-27 DIAGNOSIS — F1320 Sedative, hypnotic or anxiolytic dependence, uncomplicated: Secondary | ICD-10-CM | POA: Diagnosis not present

## 2023-08-27 DIAGNOSIS — R1310 Dysphagia, unspecified: Secondary | ICD-10-CM | POA: Diagnosis not present

## 2023-08-27 DIAGNOSIS — M810 Age-related osteoporosis without current pathological fracture: Secondary | ICD-10-CM | POA: Diagnosis not present

## 2023-08-27 DIAGNOSIS — G20A1 Parkinson's disease without dyskinesia, without mention of fluctuations: Secondary | ICD-10-CM | POA: Diagnosis not present

## 2023-08-27 DIAGNOSIS — I10 Essential (primary) hypertension: Secondary | ICD-10-CM | POA: Diagnosis not present

## 2023-08-27 DIAGNOSIS — E43 Unspecified severe protein-calorie malnutrition: Secondary | ICD-10-CM | POA: Diagnosis not present

## 2023-08-29 DIAGNOSIS — G2581 Restless legs syndrome: Secondary | ICD-10-CM | POA: Diagnosis not present

## 2023-08-29 DIAGNOSIS — M1990 Unspecified osteoarthritis, unspecified site: Secondary | ICD-10-CM | POA: Diagnosis not present

## 2023-08-29 DIAGNOSIS — R1310 Dysphagia, unspecified: Secondary | ICD-10-CM | POA: Diagnosis not present

## 2023-08-29 DIAGNOSIS — G20A1 Parkinson's disease without dyskinesia, without mention of fluctuations: Secondary | ICD-10-CM | POA: Diagnosis not present

## 2023-08-29 DIAGNOSIS — F1320 Sedative, hypnotic or anxiolytic dependence, uncomplicated: Secondary | ICD-10-CM | POA: Diagnosis not present

## 2023-08-29 DIAGNOSIS — I10 Essential (primary) hypertension: Secondary | ICD-10-CM | POA: Diagnosis not present

## 2023-08-29 DIAGNOSIS — E876 Hypokalemia: Secondary | ICD-10-CM | POA: Diagnosis not present

## 2023-08-29 DIAGNOSIS — M810 Age-related osteoporosis without current pathological fracture: Secondary | ICD-10-CM | POA: Diagnosis not present

## 2023-08-29 DIAGNOSIS — E43 Unspecified severe protein-calorie malnutrition: Secondary | ICD-10-CM | POA: Diagnosis not present

## 2023-09-05 NOTE — Progress Notes (Signed)
Coags sent to assess for any increased bleeding risk

## 2023-09-20 DIAGNOSIS — G20A1 Parkinson's disease without dyskinesia, without mention of fluctuations: Secondary | ICD-10-CM | POA: Diagnosis not present

## 2023-09-20 DIAGNOSIS — M6281 Muscle weakness (generalized): Secondary | ICD-10-CM | POA: Diagnosis not present

## 2023-09-20 DIAGNOSIS — M5430 Sciatica, unspecified side: Secondary | ICD-10-CM | POA: Diagnosis not present

## 2023-09-20 DIAGNOSIS — M810 Age-related osteoporosis without current pathological fracture: Secondary | ICD-10-CM | POA: Diagnosis not present

## 2023-10-17 DIAGNOSIS — G20B2 Parkinson's disease with dyskinesia, with fluctuations: Secondary | ICD-10-CM | POA: Diagnosis not present

## 2023-10-18 ENCOUNTER — Ambulatory Visit: Payer: Medicare PPO | Admitting: Podiatry

## 2023-10-21 DIAGNOSIS — G20A1 Parkinson's disease without dyskinesia, without mention of fluctuations: Secondary | ICD-10-CM | POA: Diagnosis not present

## 2023-10-21 DIAGNOSIS — M5430 Sciatica, unspecified side: Secondary | ICD-10-CM | POA: Diagnosis not present

## 2023-10-21 DIAGNOSIS — M6281 Muscle weakness (generalized): Secondary | ICD-10-CM | POA: Diagnosis not present

## 2023-10-21 DIAGNOSIS — M810 Age-related osteoporosis without current pathological fracture: Secondary | ICD-10-CM | POA: Diagnosis not present

## 2023-11-12 ENCOUNTER — Ambulatory Visit: Payer: Medicare PPO | Admitting: Podiatry

## 2023-11-12 ENCOUNTER — Encounter: Payer: Self-pay | Admitting: Podiatry

## 2023-11-12 DIAGNOSIS — M79609 Pain in unspecified limb: Secondary | ICD-10-CM | POA: Diagnosis not present

## 2023-11-12 DIAGNOSIS — B351 Tinea unguium: Secondary | ICD-10-CM | POA: Diagnosis not present

## 2023-11-12 NOTE — Progress Notes (Signed)
 Subjective: Chief Complaint  Patient presents with   RFC    RM#13 RFC      Hannah Fisher is a 73 y.o. female patient who presents to office today with concern of long,mildly painful nails.  Nails cause discomfort as they become elongated.  Denies any drainage or swelling to the toenails.   Objective: General: Patient is awake, alert, and oriented x 3 and in no acute distress.  Integument: Skin is warm, dry and supple bilateral. Nails are tender, long, thickened and  dystrophic 1-5 bilateral. No signs of infection.  There is some clear along the proximal nail folds.  Vasculature:  Dorsalis Pedis pulse 2/4 bilateral. Posterior Tibial pulse  1/4 bilateral.  Capillary fill time <3 sec 1-5 bilateral.   Neurology: The patient has intact sensation intact.  Musculoskeletal: No other areas of discomfort.  Uses a walker.  Assessment and Plan: Symptomatic onychomycosis  -Daily foot inspection -Follow-up in 3 months or sooner if any problems arise. In the meantime, encouraged to call the office with any questions, concerns, change in symptoms.   Bobbie Burows, DPM

## 2023-11-21 DIAGNOSIS — M6281 Muscle weakness (generalized): Secondary | ICD-10-CM | POA: Diagnosis not present

## 2023-11-21 DIAGNOSIS — M810 Age-related osteoporosis without current pathological fracture: Secondary | ICD-10-CM | POA: Diagnosis not present

## 2023-11-21 DIAGNOSIS — M5430 Sciatica, unspecified side: Secondary | ICD-10-CM | POA: Diagnosis not present

## 2023-11-21 DIAGNOSIS — G20A1 Parkinson's disease without dyskinesia, without mention of fluctuations: Secondary | ICD-10-CM | POA: Diagnosis not present

## 2023-11-22 ENCOUNTER — Other Ambulatory Visit: Payer: Self-pay

## 2023-11-22 ENCOUNTER — Emergency Department (HOSPITAL_COMMUNITY)
Admission: EM | Admit: 2023-11-22 | Discharge: 2023-11-22 | Disposition: A | Discharge status: Home / Self Care | Payer: Medicare PPO | Attending: Emergency Medicine | Admitting: Emergency Medicine

## 2023-11-22 ENCOUNTER — Emergency Department (HOSPITAL_COMMUNITY): Payer: Medicare PPO

## 2023-11-22 ENCOUNTER — Encounter (HOSPITAL_COMMUNITY): Payer: Self-pay | Admitting: Emergency Medicine

## 2023-11-22 DIAGNOSIS — W19XXXA Unspecified fall, initial encounter: Secondary | ICD-10-CM | POA: Diagnosis not present

## 2023-11-22 DIAGNOSIS — G20C Parkinsonism, unspecified: Secondary | ICD-10-CM | POA: Insufficient documentation

## 2023-11-22 DIAGNOSIS — I10 Essential (primary) hypertension: Secondary | ICD-10-CM | POA: Diagnosis not present

## 2023-11-22 DIAGNOSIS — S40012A Contusion of left shoulder, initial encounter: Secondary | ICD-10-CM | POA: Diagnosis not present

## 2023-11-22 DIAGNOSIS — F29 Unspecified psychosis not due to a substance or known physiological condition: Secondary | ICD-10-CM | POA: Diagnosis not present

## 2023-11-22 DIAGNOSIS — R030 Elevated blood-pressure reading, without diagnosis of hypertension: Secondary | ICD-10-CM

## 2023-11-22 DIAGNOSIS — M545 Low back pain, unspecified: Secondary | ICD-10-CM | POA: Diagnosis not present

## 2023-11-22 DIAGNOSIS — Z79899 Other long term (current) drug therapy: Secondary | ICD-10-CM | POA: Diagnosis not present

## 2023-11-22 DIAGNOSIS — Z7982 Long term (current) use of aspirin: Secondary | ICD-10-CM | POA: Insufficient documentation

## 2023-11-22 DIAGNOSIS — Z7401 Bed confinement status: Secondary | ICD-10-CM | POA: Diagnosis not present

## 2023-11-22 DIAGNOSIS — M25512 Pain in left shoulder: Secondary | ICD-10-CM | POA: Diagnosis present

## 2023-11-22 DIAGNOSIS — W010XXA Fall on same level from slipping, tripping and stumbling without subsequent striking against object, initial encounter: Secondary | ICD-10-CM

## 2023-11-22 DIAGNOSIS — M549 Dorsalgia, unspecified: Secondary | ICD-10-CM | POA: Diagnosis not present

## 2023-11-22 DIAGNOSIS — M19012 Primary osteoarthritis, left shoulder: Secondary | ICD-10-CM | POA: Diagnosis not present

## 2023-11-22 MED ORDER — ACETAMINOPHEN 500 MG PO TABS
1000.0000 mg | ORAL_TABLET | Freq: Once | ORAL | Status: AC
  Administered 2023-11-22: 1000 mg via ORAL
  Filled 2023-11-22: qty 2

## 2023-11-22 NOTE — ED Notes (Signed)
 Please call Hannah Fisher Daughter [phone] if there are question

## 2023-11-22 NOTE — ED Provider Triage Note (Signed)
 Emergency Medicine Provider Triage Evaluation Note  Hannah Fisher , a 73 y.o. female  was evaluated in triage.  Pt complains of a fall.  This was reported by the family member who was watching her on a home camera system.  Saw her fall.  Patient was complaining of left shoulder and left lower back pain after the fall.  Denies head injury denies loss of consciousness denies neck pain.  Patient is confused at baseline.  History of Parkinson's.  Reportedly at her baseline..  Review of Systems  Positive: Fall, back pain, L shoulder pain Negative:   Physical Exam  LMP 10/02/1990   SpO2 98%  Gen:   Awake, no distress   Resp:  Normal effort  MSK:   Moves extremities without difficulty  Other:  She points to her left low back as her discomfort.  I do not appreciate any obvious bony tenderness there is no midline tenderness develops or deformities.  Medical Decision Making  Medically screening exam initiated at 4:15 PM.  Appropriate orders placed.  [name] was informed that the remainder of the evaluation will be completed by another provider, this initial triage assessment does not replace that evaluation, and the importance of remaining in the ED until their evaluation is complete.     Melene Plan, DO 11/22/23 1615

## 2023-11-22 NOTE — ED Triage Notes (Addendum)
 Pt arrives via EMS from [facility]. EMS states patient's daughter was watching her on the ring camera and saw her fall. Pt did not hit her head and did not appear to lose consciousness. Reported hx of confusion due to Parkinson's.

## 2023-11-22 NOTE — Discharge Instructions (Addendum)
 It was our pleasure to provide your ER care today - we hope that you feel better.  Fall precautions.  Take acetaminophen as need.   Follow up closely with primary care doctor. Also follow up with your doctor regarding your high blood pressure.   Return to ER if worse, new symptoms, fevers, new/severe pain, chest pain, trouble breathing, weak/fainting, or other concern.

## 2023-11-22 NOTE — ED Notes (Signed)
 Patient able to eat and drink with no difficulties. Updated daughter on plan of care for discharge.

## 2023-11-22 NOTE — ED Provider Notes (Signed)
 [place] EMERGENCY DEPARTMENT AT [facility] Provider Note   CSN: [id number] Arrival date & time: 11/22/23  4098     History  Chief Complaint  Patient presents with   Hannah Fisher    Hannah Fisher is a 73 y.o. female.  Pt s/p fall  facility. No loc noted, and pts mental status reported as c/w baseline post fall. Pt currently denies specific c/o, but is limited historian - level 5 caveat. No anticoagulant use. No headache. No neck/back pain. No chest pain or sob. No abd pain or nv. Currently denies extremity pain or injury. Skin intact.   The history is provided by the patient, medical records and the EMS personnel. The history is limited by the condition of the patient.       Home Medications Prior to Admission medications   Medication Sig Start Date End Date Taking? Authorizing Provider  acetaminophen (TYLENOL) 325 MG tablet Take 2 tablets (650 mg total) by mouth every 6 (six) hours as needed for mild pain (or Fever >/= 101). 05/31/23   Lonia Blood, MD  ALPRAZolam Prudy Feeler) 0.25 MG tablet Take 1 tablet (0.25 mg total) by mouth 2 (two) times daily. 05/31/23   Lonia Blood, MD  aspirin 81 MG EC tablet Take 81 mg by mouth daily.    [provider]  bisacodyl (DULCOLAX) 10 MG suppository Place 1 suppository (10 mg total) rectally daily as needed for severe constipation. 05/31/23   Lonia Blood, MD  calcium carbonate (CALCIUM 600) 600 MG TABS tablet Take 600 mg by mouth daily.    [provider]  carbidopa-levodopa (SINEMET IR) 25-100 MG tablet Take 1.5-2 tablets by mouth See admin instructions. Take 2 tablets by mouth at 9 AM & 12 NOON, then 1.5 tablets at 7 PM 09/18/22   [provider]  folic acid (FOLVITE) 800 MCG tablet Take 800 mcg by mouth daily.    [provider]  lisinopril (ZESTRIL) 2.5 MG tablet Take 2.5 mg by mouth daily.    [provider]  Magnesium Glycinate 100 MG CAPS Take 200 mg by mouth daily.     [provider]  mirtazapine (REMERON) 7.5 MG tablet Take 7.5 mg by mouth at bedtime.    [provider]  Multiple Vitamin (MULTIVITAMIN WITH MINERALS) TABS tablet Take 1 tablet by mouth daily. 05/31/23   Lonia Blood, MD  polyethylene glycol (MIRALAX / GLYCOLAX) 17 g packet Take 17 g by mouth daily. 01/05/21   Erick Blinks, MD  rOPINIRole (REQUIP) 0.25 MG tablet Take 0.25 mg by mouth every evening.    [provider]  thiamine (VITAMIN B1) 100 MG tablet Take 200 mg by mouth daily.    [provider]      Allergies    Prednisone, Penicillins, Beta adrenergic blockers, and Sulfa antibiotics    Review of Systems   Review of Systems  Constitutional:  Negative for fever.  HENT:  Negative for nosebleeds.   Eyes:  Negative for pain and visual disturbance.  Respiratory:  Negative for shortness of breath.   Cardiovascular:  Negative for chest pain.  Gastrointestinal:  Negative for abdominal pain, nausea and vomiting.  Genitourinary:  Negative for dysuria and flank pain.  Musculoskeletal:  Negative for back pain and neck pain.  Skin:  Negative for wound.  Neurological:  Negative for weakness, numbness and headaches.    Physical Exam Updated Vital Signs BP (!) 158/102   Pulse 86   Temp 98.1 F (  36.7 C)   Resp 17   LMP 10/02/1990   SpO2 98%  Physical Exam Vitals and nursing note reviewed.  Constitutional:      Appearance: Normal appearance. She is well-developed.  HENT:     Head: Atraumatic.     Nose: Nose normal.     Mouth/Throat:     Mouth: Mucous membranes are moist.  Eyes:     General: No scleral icterus.    Conjunctiva/sclera: Conjunctivae normal.     Pupils: Pupils are equal, round, and reactive to light.  Neck:     Vascular: No carotid bruit.     Trachea: No tracheal deviation.  Cardiovascular:     Rate and Rhythm: Normal rate and regular rhythm.     Pulses: Normal pulses.     Heart sounds: Normal heart sounds. No murmur  heard.    No friction rub. No gallop.  Pulmonary:     Effort: Pulmonary effort is normal. No respiratory distress.     Breath sounds: Normal breath sounds.  Chest:     Chest wall: No tenderness.  Abdominal:     General: There is no distension.     Palpations: Abdomen is soft.     Tenderness: There is no abdominal tenderness.  Genitourinary:    Comments: No cva tenderness.  Musculoskeletal:        General: No swelling or tenderness.     Cervical back: Normal range of motion and neck supple. No rigidity or tenderness. No muscular tenderness.     Comments: CTLS spine, non tender, aligned, no step off. Good rom bil extremities without pain or focal bony tenderness.   Skin:    General: Skin is warm and dry.     Findings: No rash.  Neurological:     Mental Status: She is alert.     Comments: Alert, speech normal. GCS 15. Motor/sens grossly intact bil.   Psychiatric:        Mood and Affect: Mood normal.     ED Results / Procedures / Treatments   Labs (all labs ordered are listed, but only abnormal results are displayed) Labs Reviewed - No data to display  EKG None  Radiology DG Shoulder Left Result Date: 11/22/2023 CLINICAL DATA:  Low back pain fall EXAM: LEFT SHOULDER - 2+ VIEW COMPARISON:  None Available. FINDINGS: No acute fracture or malalignment. Moderate degenerative change of the glenohumeral joint. Left apex is clear IMPRESSION: No acute osseous abnormality. Moderate degenerative change of the glenohumeral joint. Electronically Signed   By: Jasmine Pang M.D.   On: 11/22/2023 17:00   DG Lumbar Spine Complete Result Date: 11/22/2023 CLINICAL DATA:  Low back pain post fall EXAM: LUMBAR SPINE - COMPLETE 4+ VIEW COMPARISON:  CT 05/22/2023 FINDINGS: Chronic L4 compression deformity with stable 3 mm retropulsion of the upper vertebral body. Alignment is normal. Disc spaces are patent IMPRESSION: No acute osseous abnormality. Chronic L4 compression deformity. Electronically Signed    By: Jasmine Pang M.D.   On: 11/22/2023 16:56    Procedures Procedures    Medications Ordered in ED Medications  acetaminophen (TYLENOL) tablet 1,000 mg (1,000 mg Oral Given 11/22/23 1624)    ED Course/ Medical Decision Making/ A&P                                 Medical Decision Making Problems Addressed: Contusion of left shoulder, initial encounter: acute illness or injury Elevated blood pressure  reading: acute illness or injury Essential hypertension: chronic illness or injury that poses a threat to life or bodily functions Fall from slip, trip, or stumble, initial encounter: acute illness or injury with systemic symptoms that poses a threat to life or bodily functions  Amount and/or Complexity of Data Reviewed Independent Historian: EMS    Details: hx External Data Reviewed: notes. Radiology: ordered and independent interpretation performed. Decision-making details documented in ED Course.  Risk OTC drugs.   Imaging ordered from triage.   Reviewed nursing notes and prior charts for additional history.   Acetaminophen po.   Xrays reviewed/interpreted by me - no fx.  Pt appears comfortable and in no acute distress.  Pt currently appears stable for d/c.   Return precautions provided.           Final Clinical Impression(s) / ED Diagnoses Final diagnoses:  Fall from slip, trip, or stumble, initial encounter  Contusion of left shoulder, initial encounter  Elevated blood pressure reading  Essential hypertension    Rx / DC Orders ED Discharge Orders     None         Cathren Laine, MD 11/22/23 1900

## 2023-11-26 DIAGNOSIS — R296 Repeated falls: Secondary | ICD-10-CM | POA: Diagnosis not present

## 2023-11-26 DIAGNOSIS — R278 Other lack of coordination: Secondary | ICD-10-CM | POA: Diagnosis not present

## 2023-11-26 DIAGNOSIS — R2689 Other abnormalities of gait and mobility: Secondary | ICD-10-CM | POA: Diagnosis not present

## 2023-11-26 DIAGNOSIS — R1312 Dysphagia, oropharyngeal phase: Secondary | ICD-10-CM | POA: Diagnosis not present

## 2023-11-26 DIAGNOSIS — M6259 Muscle wasting and atrophy, not elsewhere classified, multiple sites: Secondary | ICD-10-CM | POA: Diagnosis not present

## 2023-11-26 DIAGNOSIS — R4789 Other speech disturbances: Secondary | ICD-10-CM | POA: Diagnosis not present

## 2023-11-26 DIAGNOSIS — R4185 Anosognosia: Secondary | ICD-10-CM | POA: Diagnosis not present

## 2023-11-26 DIAGNOSIS — R488 Other symbolic dysfunctions: Secondary | ICD-10-CM | POA: Diagnosis not present

## 2023-11-27 DIAGNOSIS — R2689 Other abnormalities of gait and mobility: Secondary | ICD-10-CM | POA: Diagnosis not present

## 2023-11-27 DIAGNOSIS — Z515 Encounter for palliative care: Secondary | ICD-10-CM | POA: Diagnosis not present

## 2023-11-27 DIAGNOSIS — R296 Repeated falls: Secondary | ICD-10-CM | POA: Diagnosis not present

## 2023-11-27 DIAGNOSIS — R278 Other lack of coordination: Secondary | ICD-10-CM | POA: Diagnosis not present

## 2023-11-27 DIAGNOSIS — R634 Abnormal weight loss: Secondary | ICD-10-CM | POA: Diagnosis not present

## 2023-11-27 DIAGNOSIS — M6259 Muscle wasting and atrophy, not elsewhere classified, multiple sites: Secondary | ICD-10-CM | POA: Diagnosis not present

## 2023-11-27 DIAGNOSIS — G20A1 Parkinson's disease without dyskinesia, without mention of fluctuations: Secondary | ICD-10-CM | POA: Diagnosis not present

## 2023-11-30 DIAGNOSIS — R278 Other lack of coordination: Secondary | ICD-10-CM | POA: Diagnosis not present

## 2023-11-30 DIAGNOSIS — R2689 Other abnormalities of gait and mobility: Secondary | ICD-10-CM | POA: Diagnosis not present

## 2023-11-30 DIAGNOSIS — R488 Other symbolic dysfunctions: Secondary | ICD-10-CM | POA: Diagnosis not present

## 2023-11-30 DIAGNOSIS — R296 Repeated falls: Secondary | ICD-10-CM | POA: Diagnosis not present

## 2023-11-30 DIAGNOSIS — R4789 Other speech disturbances: Secondary | ICD-10-CM | POA: Diagnosis not present

## 2023-11-30 DIAGNOSIS — R1312 Dysphagia, oropharyngeal phase: Secondary | ICD-10-CM | POA: Diagnosis not present

## 2023-11-30 DIAGNOSIS — R4185 Anosognosia: Secondary | ICD-10-CM | POA: Diagnosis not present

## 2023-11-30 DIAGNOSIS — M6259 Muscle wasting and atrophy, not elsewhere classified, multiple sites: Secondary | ICD-10-CM | POA: Diagnosis not present

## 2023-12-03 DIAGNOSIS — R278 Other lack of coordination: Secondary | ICD-10-CM | POA: Diagnosis not present

## 2023-12-03 DIAGNOSIS — M6259 Muscle wasting and atrophy, not elsewhere classified, multiple sites: Secondary | ICD-10-CM | POA: Diagnosis not present

## 2023-12-03 DIAGNOSIS — R2689 Other abnormalities of gait and mobility: Secondary | ICD-10-CM | POA: Diagnosis not present

## 2023-12-03 DIAGNOSIS — R296 Repeated falls: Secondary | ICD-10-CM | POA: Diagnosis not present

## 2023-12-04 DIAGNOSIS — R2689 Other abnormalities of gait and mobility: Secondary | ICD-10-CM | POA: Diagnosis not present

## 2023-12-04 DIAGNOSIS — R296 Repeated falls: Secondary | ICD-10-CM | POA: Diagnosis not present

## 2023-12-04 DIAGNOSIS — M6259 Muscle wasting and atrophy, not elsewhere classified, multiple sites: Secondary | ICD-10-CM | POA: Diagnosis not present

## 2023-12-04 DIAGNOSIS — R278 Other lack of coordination: Secondary | ICD-10-CM | POA: Diagnosis not present

## 2023-12-07 DIAGNOSIS — R296 Repeated falls: Secondary | ICD-10-CM | POA: Diagnosis not present

## 2023-12-07 DIAGNOSIS — R2689 Other abnormalities of gait and mobility: Secondary | ICD-10-CM | POA: Diagnosis not present

## 2023-12-07 DIAGNOSIS — M6259 Muscle wasting and atrophy, not elsewhere classified, multiple sites: Secondary | ICD-10-CM | POA: Diagnosis not present

## 2023-12-07 DIAGNOSIS — R278 Other lack of coordination: Secondary | ICD-10-CM | POA: Diagnosis not present

## 2023-12-12 DIAGNOSIS — R296 Repeated falls: Secondary | ICD-10-CM | POA: Diagnosis not present

## 2023-12-12 DIAGNOSIS — R2689 Other abnormalities of gait and mobility: Secondary | ICD-10-CM | POA: Diagnosis not present

## 2023-12-12 DIAGNOSIS — M6259 Muscle wasting and atrophy, not elsewhere classified, multiple sites: Secondary | ICD-10-CM | POA: Diagnosis not present

## 2023-12-12 DIAGNOSIS — R278 Other lack of coordination: Secondary | ICD-10-CM | POA: Diagnosis not present

## 2023-12-13 DIAGNOSIS — R296 Repeated falls: Secondary | ICD-10-CM | POA: Diagnosis not present

## 2023-12-13 DIAGNOSIS — R4789 Other speech disturbances: Secondary | ICD-10-CM | POA: Diagnosis not present

## 2023-12-13 DIAGNOSIS — R4185 Anosognosia: Secondary | ICD-10-CM | POA: Diagnosis not present

## 2023-12-13 DIAGNOSIS — R488 Other symbolic dysfunctions: Secondary | ICD-10-CM | POA: Diagnosis not present

## 2023-12-13 DIAGNOSIS — R2689 Other abnormalities of gait and mobility: Secondary | ICD-10-CM | POA: Diagnosis not present

## 2023-12-13 DIAGNOSIS — R278 Other lack of coordination: Secondary | ICD-10-CM | POA: Diagnosis not present

## 2023-12-13 DIAGNOSIS — M6259 Muscle wasting and atrophy, not elsewhere classified, multiple sites: Secondary | ICD-10-CM | POA: Diagnosis not present

## 2023-12-13 DIAGNOSIS — R1312 Dysphagia, oropharyngeal phase: Secondary | ICD-10-CM | POA: Diagnosis not present

## 2023-12-14 DIAGNOSIS — R4789 Other speech disturbances: Secondary | ICD-10-CM | POA: Diagnosis not present

## 2023-12-14 DIAGNOSIS — M6259 Muscle wasting and atrophy, not elsewhere classified, multiple sites: Secondary | ICD-10-CM | POA: Diagnosis not present

## 2023-12-14 DIAGNOSIS — R488 Other symbolic dysfunctions: Secondary | ICD-10-CM | POA: Diagnosis not present

## 2023-12-14 DIAGNOSIS — R296 Repeated falls: Secondary | ICD-10-CM | POA: Diagnosis not present

## 2023-12-14 DIAGNOSIS — R278 Other lack of coordination: Secondary | ICD-10-CM | POA: Diagnosis not present

## 2023-12-14 DIAGNOSIS — R2689 Other abnormalities of gait and mobility: Secondary | ICD-10-CM | POA: Diagnosis not present

## 2023-12-14 DIAGNOSIS — R1312 Dysphagia, oropharyngeal phase: Secondary | ICD-10-CM | POA: Diagnosis not present

## 2023-12-14 DIAGNOSIS — R4185 Anosognosia: Secondary | ICD-10-CM | POA: Diagnosis not present

## 2023-12-17 DIAGNOSIS — R278 Other lack of coordination: Secondary | ICD-10-CM | POA: Diagnosis not present

## 2023-12-17 DIAGNOSIS — R2689 Other abnormalities of gait and mobility: Secondary | ICD-10-CM | POA: Diagnosis not present

## 2023-12-17 DIAGNOSIS — R296 Repeated falls: Secondary | ICD-10-CM | POA: Diagnosis not present

## 2023-12-17 DIAGNOSIS — M6259 Muscle wasting and atrophy, not elsewhere classified, multiple sites: Secondary | ICD-10-CM | POA: Diagnosis not present

## 2023-12-18 DIAGNOSIS — R4789 Other speech disturbances: Secondary | ICD-10-CM | POA: Diagnosis not present

## 2023-12-18 DIAGNOSIS — R4185 Anosognosia: Secondary | ICD-10-CM | POA: Diagnosis not present

## 2023-12-18 DIAGNOSIS — R488 Other symbolic dysfunctions: Secondary | ICD-10-CM | POA: Diagnosis not present

## 2023-12-18 DIAGNOSIS — R1312 Dysphagia, oropharyngeal phase: Secondary | ICD-10-CM | POA: Diagnosis not present

## 2023-12-19 DIAGNOSIS — M5430 Sciatica, unspecified side: Secondary | ICD-10-CM | POA: Diagnosis not present

## 2023-12-19 DIAGNOSIS — R4185 Anosognosia: Secondary | ICD-10-CM | POA: Diagnosis not present

## 2023-12-19 DIAGNOSIS — R1312 Dysphagia, oropharyngeal phase: Secondary | ICD-10-CM | POA: Diagnosis not present

## 2023-12-19 DIAGNOSIS — R4789 Other speech disturbances: Secondary | ICD-10-CM | POA: Diagnosis not present

## 2023-12-19 DIAGNOSIS — G20A1 Parkinson's disease without dyskinesia, without mention of fluctuations: Secondary | ICD-10-CM | POA: Diagnosis not present

## 2023-12-19 DIAGNOSIS — R488 Other symbolic dysfunctions: Secondary | ICD-10-CM | POA: Diagnosis not present

## 2023-12-19 DIAGNOSIS — M6281 Muscle weakness (generalized): Secondary | ICD-10-CM | POA: Diagnosis not present

## 2023-12-19 DIAGNOSIS — M810 Age-related osteoporosis without current pathological fracture: Secondary | ICD-10-CM | POA: Diagnosis not present

## 2023-12-20 DIAGNOSIS — R2689 Other abnormalities of gait and mobility: Secondary | ICD-10-CM | POA: Diagnosis not present

## 2023-12-20 DIAGNOSIS — R296 Repeated falls: Secondary | ICD-10-CM | POA: Diagnosis not present

## 2023-12-20 DIAGNOSIS — R278 Other lack of coordination: Secondary | ICD-10-CM | POA: Diagnosis not present

## 2023-12-20 DIAGNOSIS — M6259 Muscle wasting and atrophy, not elsewhere classified, multiple sites: Secondary | ICD-10-CM | POA: Diagnosis not present

## 2023-12-21 DIAGNOSIS — R296 Repeated falls: Secondary | ICD-10-CM | POA: Diagnosis not present

## 2023-12-21 DIAGNOSIS — M6259 Muscle wasting and atrophy, not elsewhere classified, multiple sites: Secondary | ICD-10-CM | POA: Diagnosis not present

## 2023-12-21 DIAGNOSIS — R2689 Other abnormalities of gait and mobility: Secondary | ICD-10-CM | POA: Diagnosis not present

## 2023-12-21 DIAGNOSIS — R278 Other lack of coordination: Secondary | ICD-10-CM | POA: Diagnosis not present

## 2023-12-24 DIAGNOSIS — R296 Repeated falls: Secondary | ICD-10-CM | POA: Diagnosis not present

## 2023-12-24 DIAGNOSIS — R2689 Other abnormalities of gait and mobility: Secondary | ICD-10-CM | POA: Diagnosis not present

## 2023-12-24 DIAGNOSIS — R278 Other lack of coordination: Secondary | ICD-10-CM | POA: Diagnosis not present

## 2023-12-24 DIAGNOSIS — M6259 Muscle wasting and atrophy, not elsewhere classified, multiple sites: Secondary | ICD-10-CM | POA: Diagnosis not present

## 2023-12-25 DIAGNOSIS — M6259 Muscle wasting and atrophy, not elsewhere classified, multiple sites: Secondary | ICD-10-CM | POA: Diagnosis not present

## 2023-12-25 DIAGNOSIS — R2689 Other abnormalities of gait and mobility: Secondary | ICD-10-CM | POA: Diagnosis not present

## 2023-12-25 DIAGNOSIS — R296 Repeated falls: Secondary | ICD-10-CM | POA: Diagnosis not present

## 2023-12-25 DIAGNOSIS — R278 Other lack of coordination: Secondary | ICD-10-CM | POA: Diagnosis not present

## 2023-12-26 DIAGNOSIS — R488 Other symbolic dysfunctions: Secondary | ICD-10-CM | POA: Diagnosis not present

## 2023-12-26 DIAGNOSIS — R4789 Other speech disturbances: Secondary | ICD-10-CM | POA: Diagnosis not present

## 2023-12-26 DIAGNOSIS — R4185 Anosognosia: Secondary | ICD-10-CM | POA: Diagnosis not present

## 2023-12-26 DIAGNOSIS — R1312 Dysphagia, oropharyngeal phase: Secondary | ICD-10-CM | POA: Diagnosis not present

## 2023-12-28 DIAGNOSIS — R488 Other symbolic dysfunctions: Secondary | ICD-10-CM | POA: Diagnosis not present

## 2023-12-28 DIAGNOSIS — M6259 Muscle wasting and atrophy, not elsewhere classified, multiple sites: Secondary | ICD-10-CM | POA: Diagnosis not present

## 2023-12-28 DIAGNOSIS — R2689 Other abnormalities of gait and mobility: Secondary | ICD-10-CM | POA: Diagnosis not present

## 2023-12-28 DIAGNOSIS — R4185 Anosognosia: Secondary | ICD-10-CM | POA: Diagnosis not present

## 2023-12-28 DIAGNOSIS — R278 Other lack of coordination: Secondary | ICD-10-CM | POA: Diagnosis not present

## 2023-12-28 DIAGNOSIS — R296 Repeated falls: Secondary | ICD-10-CM | POA: Diagnosis not present

## 2023-12-28 DIAGNOSIS — R4789 Other speech disturbances: Secondary | ICD-10-CM | POA: Diagnosis not present

## 2023-12-28 DIAGNOSIS — R1312 Dysphagia, oropharyngeal phase: Secondary | ICD-10-CM | POA: Diagnosis not present

## 2023-12-31 DIAGNOSIS — R488 Other symbolic dysfunctions: Secondary | ICD-10-CM | POA: Diagnosis not present

## 2023-12-31 DIAGNOSIS — R278 Other lack of coordination: Secondary | ICD-10-CM | POA: Diagnosis not present

## 2023-12-31 DIAGNOSIS — R4789 Other speech disturbances: Secondary | ICD-10-CM | POA: Diagnosis not present

## 2023-12-31 DIAGNOSIS — R4185 Anosognosia: Secondary | ICD-10-CM | POA: Diagnosis not present

## 2023-12-31 DIAGNOSIS — R296 Repeated falls: Secondary | ICD-10-CM | POA: Diagnosis not present

## 2023-12-31 DIAGNOSIS — M6259 Muscle wasting and atrophy, not elsewhere classified, multiple sites: Secondary | ICD-10-CM | POA: Diagnosis not present

## 2023-12-31 DIAGNOSIS — R1312 Dysphagia, oropharyngeal phase: Secondary | ICD-10-CM | POA: Diagnosis not present

## 2023-12-31 DIAGNOSIS — R2689 Other abnormalities of gait and mobility: Secondary | ICD-10-CM | POA: Diagnosis not present

## 2024-01-01 DIAGNOSIS — R2689 Other abnormalities of gait and mobility: Secondary | ICD-10-CM | POA: Diagnosis not present

## 2024-01-01 DIAGNOSIS — R278 Other lack of coordination: Secondary | ICD-10-CM | POA: Diagnosis not present

## 2024-01-01 DIAGNOSIS — Z515 Encounter for palliative care: Secondary | ICD-10-CM | POA: Diagnosis not present

## 2024-01-01 DIAGNOSIS — G20A1 Parkinson's disease without dyskinesia, without mention of fluctuations: Secondary | ICD-10-CM | POA: Diagnosis not present

## 2024-01-01 DIAGNOSIS — M6259 Muscle wasting and atrophy, not elsewhere classified, multiple sites: Secondary | ICD-10-CM | POA: Diagnosis not present

## 2024-01-01 DIAGNOSIS — R634 Abnormal weight loss: Secondary | ICD-10-CM | POA: Diagnosis not present

## 2024-01-01 DIAGNOSIS — R296 Repeated falls: Secondary | ICD-10-CM | POA: Diagnosis not present

## 2024-01-02 DIAGNOSIS — R4789 Other speech disturbances: Secondary | ICD-10-CM | POA: Diagnosis not present

## 2024-01-02 DIAGNOSIS — R296 Repeated falls: Secondary | ICD-10-CM | POA: Diagnosis not present

## 2024-01-02 DIAGNOSIS — R4185 Anosognosia: Secondary | ICD-10-CM | POA: Diagnosis not present

## 2024-01-02 DIAGNOSIS — R1312 Dysphagia, oropharyngeal phase: Secondary | ICD-10-CM | POA: Diagnosis not present

## 2024-01-02 DIAGNOSIS — M6259 Muscle wasting and atrophy, not elsewhere classified, multiple sites: Secondary | ICD-10-CM | POA: Diagnosis not present

## 2024-01-02 DIAGNOSIS — R2689 Other abnormalities of gait and mobility: Secondary | ICD-10-CM | POA: Diagnosis not present

## 2024-01-02 DIAGNOSIS — R488 Other symbolic dysfunctions: Secondary | ICD-10-CM | POA: Diagnosis not present

## 2024-01-02 DIAGNOSIS — R278 Other lack of coordination: Secondary | ICD-10-CM | POA: Diagnosis not present

## 2024-01-07 DIAGNOSIS — R1312 Dysphagia, oropharyngeal phase: Secondary | ICD-10-CM | POA: Diagnosis not present

## 2024-01-07 DIAGNOSIS — R2689 Other abnormalities of gait and mobility: Secondary | ICD-10-CM | POA: Diagnosis not present

## 2024-01-07 DIAGNOSIS — M6259 Muscle wasting and atrophy, not elsewhere classified, multiple sites: Secondary | ICD-10-CM | POA: Diagnosis not present

## 2024-01-07 DIAGNOSIS — R4185 Anosognosia: Secondary | ICD-10-CM | POA: Diagnosis not present

## 2024-01-07 DIAGNOSIS — R488 Other symbolic dysfunctions: Secondary | ICD-10-CM | POA: Diagnosis not present

## 2024-01-07 DIAGNOSIS — R278 Other lack of coordination: Secondary | ICD-10-CM | POA: Diagnosis not present

## 2024-01-07 DIAGNOSIS — R4789 Other speech disturbances: Secondary | ICD-10-CM | POA: Diagnosis not present

## 2024-01-07 DIAGNOSIS — R296 Repeated falls: Secondary | ICD-10-CM | POA: Diagnosis not present

## 2024-01-09 DIAGNOSIS — R488 Other symbolic dysfunctions: Secondary | ICD-10-CM | POA: Diagnosis not present

## 2024-01-09 DIAGNOSIS — R4789 Other speech disturbances: Secondary | ICD-10-CM | POA: Diagnosis not present

## 2024-01-09 DIAGNOSIS — R4185 Anosognosia: Secondary | ICD-10-CM | POA: Diagnosis not present

## 2024-01-09 DIAGNOSIS — R1312 Dysphagia, oropharyngeal phase: Secondary | ICD-10-CM | POA: Diagnosis not present

## 2024-01-10 DIAGNOSIS — R2689 Other abnormalities of gait and mobility: Secondary | ICD-10-CM | POA: Diagnosis not present

## 2024-01-10 DIAGNOSIS — M6259 Muscle wasting and atrophy, not elsewhere classified, multiple sites: Secondary | ICD-10-CM | POA: Diagnosis not present

## 2024-01-10 DIAGNOSIS — R296 Repeated falls: Secondary | ICD-10-CM | POA: Diagnosis not present

## 2024-01-10 DIAGNOSIS — R278 Other lack of coordination: Secondary | ICD-10-CM | POA: Diagnosis not present

## 2024-01-11 DIAGNOSIS — R278 Other lack of coordination: Secondary | ICD-10-CM | POA: Diagnosis not present

## 2024-01-11 DIAGNOSIS — R2689 Other abnormalities of gait and mobility: Secondary | ICD-10-CM | POA: Diagnosis not present

## 2024-01-11 DIAGNOSIS — R296 Repeated falls: Secondary | ICD-10-CM | POA: Diagnosis not present

## 2024-01-11 DIAGNOSIS — M6259 Muscle wasting and atrophy, not elsewhere classified, multiple sites: Secondary | ICD-10-CM | POA: Diagnosis not present

## 2024-01-14 DIAGNOSIS — M6259 Muscle wasting and atrophy, not elsewhere classified, multiple sites: Secondary | ICD-10-CM | POA: Diagnosis not present

## 2024-01-14 DIAGNOSIS — R2689 Other abnormalities of gait and mobility: Secondary | ICD-10-CM | POA: Diagnosis not present

## 2024-01-14 DIAGNOSIS — R296 Repeated falls: Secondary | ICD-10-CM | POA: Diagnosis not present

## 2024-01-14 DIAGNOSIS — R278 Other lack of coordination: Secondary | ICD-10-CM | POA: Diagnosis not present

## 2024-01-15 DIAGNOSIS — R488 Other symbolic dysfunctions: Secondary | ICD-10-CM | POA: Diagnosis not present

## 2024-01-15 DIAGNOSIS — R1312 Dysphagia, oropharyngeal phase: Secondary | ICD-10-CM | POA: Diagnosis not present

## 2024-01-15 DIAGNOSIS — R4185 Anosognosia: Secondary | ICD-10-CM | POA: Diagnosis not present

## 2024-01-15 DIAGNOSIS — R4789 Other speech disturbances: Secondary | ICD-10-CM | POA: Diagnosis not present

## 2024-01-16 DIAGNOSIS — R2689 Other abnormalities of gait and mobility: Secondary | ICD-10-CM | POA: Diagnosis not present

## 2024-01-16 DIAGNOSIS — R278 Other lack of coordination: Secondary | ICD-10-CM | POA: Diagnosis not present

## 2024-01-16 DIAGNOSIS — M6259 Muscle wasting and atrophy, not elsewhere classified, multiple sites: Secondary | ICD-10-CM | POA: Diagnosis not present

## 2024-01-16 DIAGNOSIS — R296 Repeated falls: Secondary | ICD-10-CM | POA: Diagnosis not present

## 2024-01-17 DIAGNOSIS — M6259 Muscle wasting and atrophy, not elsewhere classified, multiple sites: Secondary | ICD-10-CM | POA: Diagnosis not present

## 2024-01-17 DIAGNOSIS — R296 Repeated falls: Secondary | ICD-10-CM | POA: Diagnosis not present

## 2024-01-17 DIAGNOSIS — R2689 Other abnormalities of gait and mobility: Secondary | ICD-10-CM | POA: Diagnosis not present

## 2024-01-17 DIAGNOSIS — R278 Other lack of coordination: Secondary | ICD-10-CM | POA: Diagnosis not present

## 2024-01-19 DIAGNOSIS — M810 Age-related osteoporosis without current pathological fracture: Secondary | ICD-10-CM | POA: Diagnosis not present

## 2024-01-19 DIAGNOSIS — M6281 Muscle weakness (generalized): Secondary | ICD-10-CM | POA: Diagnosis not present

## 2024-01-19 DIAGNOSIS — M5430 Sciatica, unspecified side: Secondary | ICD-10-CM | POA: Diagnosis not present

## 2024-01-19 DIAGNOSIS — G20A1 Parkinson's disease without dyskinesia, without mention of fluctuations: Secondary | ICD-10-CM | POA: Diagnosis not present

## 2024-01-21 DIAGNOSIS — R4789 Other speech disturbances: Secondary | ICD-10-CM | POA: Diagnosis not present

## 2024-01-21 DIAGNOSIS — R1312 Dysphagia, oropharyngeal phase: Secondary | ICD-10-CM | POA: Diagnosis not present

## 2024-01-21 DIAGNOSIS — R488 Other symbolic dysfunctions: Secondary | ICD-10-CM | POA: Diagnosis not present

## 2024-01-21 DIAGNOSIS — R4185 Anosognosia: Secondary | ICD-10-CM | POA: Diagnosis not present

## 2024-01-22 DIAGNOSIS — R2689 Other abnormalities of gait and mobility: Secondary | ICD-10-CM | POA: Diagnosis not present

## 2024-01-22 DIAGNOSIS — M6259 Muscle wasting and atrophy, not elsewhere classified, multiple sites: Secondary | ICD-10-CM | POA: Diagnosis not present

## 2024-01-22 DIAGNOSIS — R278 Other lack of coordination: Secondary | ICD-10-CM | POA: Diagnosis not present

## 2024-01-22 DIAGNOSIS — R296 Repeated falls: Secondary | ICD-10-CM | POA: Diagnosis not present

## 2024-01-24 DIAGNOSIS — R4789 Other speech disturbances: Secondary | ICD-10-CM | POA: Diagnosis not present

## 2024-01-24 DIAGNOSIS — R488 Other symbolic dysfunctions: Secondary | ICD-10-CM | POA: Diagnosis not present

## 2024-01-24 DIAGNOSIS — R1312 Dysphagia, oropharyngeal phase: Secondary | ICD-10-CM | POA: Diagnosis not present

## 2024-01-24 DIAGNOSIS — R4185 Anosognosia: Secondary | ICD-10-CM | POA: Diagnosis not present

## 2024-01-25 DIAGNOSIS — R1312 Dysphagia, oropharyngeal phase: Secondary | ICD-10-CM | POA: Diagnosis not present

## 2024-01-25 DIAGNOSIS — R4789 Other speech disturbances: Secondary | ICD-10-CM | POA: Diagnosis not present

## 2024-01-25 DIAGNOSIS — R278 Other lack of coordination: Secondary | ICD-10-CM | POA: Diagnosis not present

## 2024-01-25 DIAGNOSIS — M6259 Muscle wasting and atrophy, not elsewhere classified, multiple sites: Secondary | ICD-10-CM | POA: Diagnosis not present

## 2024-01-25 DIAGNOSIS — R2689 Other abnormalities of gait and mobility: Secondary | ICD-10-CM | POA: Diagnosis not present

## 2024-01-25 DIAGNOSIS — R4185 Anosognosia: Secondary | ICD-10-CM | POA: Diagnosis not present

## 2024-01-25 DIAGNOSIS — R296 Repeated falls: Secondary | ICD-10-CM | POA: Diagnosis not present

## 2024-01-25 DIAGNOSIS — R488 Other symbolic dysfunctions: Secondary | ICD-10-CM | POA: Diagnosis not present

## 2024-01-28 DIAGNOSIS — M6259 Muscle wasting and atrophy, not elsewhere classified, multiple sites: Secondary | ICD-10-CM | POA: Diagnosis not present

## 2024-01-28 DIAGNOSIS — R296 Repeated falls: Secondary | ICD-10-CM | POA: Diagnosis not present

## 2024-01-28 DIAGNOSIS — R4789 Other speech disturbances: Secondary | ICD-10-CM | POA: Diagnosis not present

## 2024-01-28 DIAGNOSIS — R488 Other symbolic dysfunctions: Secondary | ICD-10-CM | POA: Diagnosis not present

## 2024-01-28 DIAGNOSIS — R2689 Other abnormalities of gait and mobility: Secondary | ICD-10-CM | POA: Diagnosis not present

## 2024-01-28 DIAGNOSIS — R278 Other lack of coordination: Secondary | ICD-10-CM | POA: Diagnosis not present

## 2024-01-28 DIAGNOSIS — R4185 Anosognosia: Secondary | ICD-10-CM | POA: Diagnosis not present

## 2024-01-28 DIAGNOSIS — R1312 Dysphagia, oropharyngeal phase: Secondary | ICD-10-CM | POA: Diagnosis not present

## 2024-01-30 DIAGNOSIS — R296 Repeated falls: Secondary | ICD-10-CM | POA: Diagnosis not present

## 2024-01-30 DIAGNOSIS — M6259 Muscle wasting and atrophy, not elsewhere classified, multiple sites: Secondary | ICD-10-CM | POA: Diagnosis not present

## 2024-01-30 DIAGNOSIS — R278 Other lack of coordination: Secondary | ICD-10-CM | POA: Diagnosis not present

## 2024-01-30 DIAGNOSIS — R2689 Other abnormalities of gait and mobility: Secondary | ICD-10-CM | POA: Diagnosis not present

## 2024-01-31 DIAGNOSIS — R4185 Anosognosia: Secondary | ICD-10-CM | POA: Diagnosis not present

## 2024-01-31 DIAGNOSIS — R296 Repeated falls: Secondary | ICD-10-CM | POA: Diagnosis not present

## 2024-01-31 DIAGNOSIS — R1312 Dysphagia, oropharyngeal phase: Secondary | ICD-10-CM | POA: Diagnosis not present

## 2024-01-31 DIAGNOSIS — M6259 Muscle wasting and atrophy, not elsewhere classified, multiple sites: Secondary | ICD-10-CM | POA: Diagnosis not present

## 2024-01-31 DIAGNOSIS — R4789 Other speech disturbances: Secondary | ICD-10-CM | POA: Diagnosis not present

## 2024-01-31 DIAGNOSIS — R278 Other lack of coordination: Secondary | ICD-10-CM | POA: Diagnosis not present

## 2024-01-31 DIAGNOSIS — R2689 Other abnormalities of gait and mobility: Secondary | ICD-10-CM | POA: Diagnosis not present

## 2024-01-31 DIAGNOSIS — R488 Other symbolic dysfunctions: Secondary | ICD-10-CM | POA: Diagnosis not present

## 2024-02-01 DIAGNOSIS — R278 Other lack of coordination: Secondary | ICD-10-CM | POA: Diagnosis not present

## 2024-02-01 DIAGNOSIS — M6259 Muscle wasting and atrophy, not elsewhere classified, multiple sites: Secondary | ICD-10-CM | POA: Diagnosis not present

## 2024-02-01 DIAGNOSIS — R296 Repeated falls: Secondary | ICD-10-CM | POA: Diagnosis not present

## 2024-02-01 DIAGNOSIS — R2689 Other abnormalities of gait and mobility: Secondary | ICD-10-CM | POA: Diagnosis not present

## 2024-02-04 DIAGNOSIS — R488 Other symbolic dysfunctions: Secondary | ICD-10-CM | POA: Diagnosis not present

## 2024-02-04 DIAGNOSIS — R4789 Other speech disturbances: Secondary | ICD-10-CM | POA: Diagnosis not present

## 2024-02-04 DIAGNOSIS — R4185 Anosognosia: Secondary | ICD-10-CM | POA: Diagnosis not present

## 2024-02-04 DIAGNOSIS — R1312 Dysphagia, oropharyngeal phase: Secondary | ICD-10-CM | POA: Diagnosis not present

## 2024-02-06 DIAGNOSIS — R2681 Unsteadiness on feet: Secondary | ICD-10-CM | POA: Diagnosis not present

## 2024-02-06 DIAGNOSIS — R278 Other lack of coordination: Secondary | ICD-10-CM | POA: Diagnosis not present

## 2024-02-06 DIAGNOSIS — M6259 Muscle wasting and atrophy, not elsewhere classified, multiple sites: Secondary | ICD-10-CM | POA: Diagnosis not present

## 2024-02-06 DIAGNOSIS — R296 Repeated falls: Secondary | ICD-10-CM | POA: Diagnosis not present

## 2024-02-06 DIAGNOSIS — R2689 Other abnormalities of gait and mobility: Secondary | ICD-10-CM | POA: Diagnosis not present

## 2024-02-06 DIAGNOSIS — M62521 Muscle wasting and atrophy, not elsewhere classified, right upper arm: Secondary | ICD-10-CM | POA: Diagnosis not present

## 2024-02-06 DIAGNOSIS — R488 Other symbolic dysfunctions: Secondary | ICD-10-CM | POA: Diagnosis not present

## 2024-02-06 DIAGNOSIS — M62522 Muscle wasting and atrophy, not elsewhere classified, left upper arm: Secondary | ICD-10-CM | POA: Diagnosis not present

## 2024-02-07 DIAGNOSIS — R4789 Other speech disturbances: Secondary | ICD-10-CM | POA: Diagnosis not present

## 2024-02-07 DIAGNOSIS — R278 Other lack of coordination: Secondary | ICD-10-CM | POA: Diagnosis not present

## 2024-02-07 DIAGNOSIS — R488 Other symbolic dysfunctions: Secondary | ICD-10-CM | POA: Diagnosis not present

## 2024-02-07 DIAGNOSIS — R296 Repeated falls: Secondary | ICD-10-CM | POA: Diagnosis not present

## 2024-02-07 DIAGNOSIS — R4185 Anosognosia: Secondary | ICD-10-CM | POA: Diagnosis not present

## 2024-02-07 DIAGNOSIS — R1312 Dysphagia, oropharyngeal phase: Secondary | ICD-10-CM | POA: Diagnosis not present

## 2024-02-07 DIAGNOSIS — M6259 Muscle wasting and atrophy, not elsewhere classified, multiple sites: Secondary | ICD-10-CM | POA: Diagnosis not present

## 2024-02-07 DIAGNOSIS — R2689 Other abnormalities of gait and mobility: Secondary | ICD-10-CM | POA: Diagnosis not present

## 2024-02-08 DIAGNOSIS — R296 Repeated falls: Secondary | ICD-10-CM | POA: Diagnosis not present

## 2024-02-08 DIAGNOSIS — M6259 Muscle wasting and atrophy, not elsewhere classified, multiple sites: Secondary | ICD-10-CM | POA: Diagnosis not present

## 2024-02-08 DIAGNOSIS — M62522 Muscle wasting and atrophy, not elsewhere classified, left upper arm: Secondary | ICD-10-CM | POA: Diagnosis not present

## 2024-02-08 DIAGNOSIS — R2689 Other abnormalities of gait and mobility: Secondary | ICD-10-CM | POA: Diagnosis not present

## 2024-02-08 DIAGNOSIS — R2681 Unsteadiness on feet: Secondary | ICD-10-CM | POA: Diagnosis not present

## 2024-02-08 DIAGNOSIS — R488 Other symbolic dysfunctions: Secondary | ICD-10-CM | POA: Diagnosis not present

## 2024-02-08 DIAGNOSIS — R278 Other lack of coordination: Secondary | ICD-10-CM | POA: Diagnosis not present

## 2024-02-08 DIAGNOSIS — M62521 Muscle wasting and atrophy, not elsewhere classified, right upper arm: Secondary | ICD-10-CM | POA: Diagnosis not present

## 2024-02-10 DIAGNOSIS — R296 Repeated falls: Secondary | ICD-10-CM | POA: Diagnosis not present

## 2024-02-10 DIAGNOSIS — R278 Other lack of coordination: Secondary | ICD-10-CM | POA: Diagnosis not present

## 2024-02-10 DIAGNOSIS — M62521 Muscle wasting and atrophy, not elsewhere classified, right upper arm: Secondary | ICD-10-CM | POA: Diagnosis not present

## 2024-02-10 DIAGNOSIS — R2681 Unsteadiness on feet: Secondary | ICD-10-CM | POA: Diagnosis not present

## 2024-02-10 DIAGNOSIS — R488 Other symbolic dysfunctions: Secondary | ICD-10-CM | POA: Diagnosis not present

## 2024-02-10 DIAGNOSIS — M62522 Muscle wasting and atrophy, not elsewhere classified, left upper arm: Secondary | ICD-10-CM | POA: Diagnosis not present

## 2024-02-11 DIAGNOSIS — R488 Other symbolic dysfunctions: Secondary | ICD-10-CM | POA: Diagnosis not present

## 2024-02-11 DIAGNOSIS — R278 Other lack of coordination: Secondary | ICD-10-CM | POA: Diagnosis not present

## 2024-02-11 DIAGNOSIS — R2689 Other abnormalities of gait and mobility: Secondary | ICD-10-CM | POA: Diagnosis not present

## 2024-02-11 DIAGNOSIS — M62522 Muscle wasting and atrophy, not elsewhere classified, left upper arm: Secondary | ICD-10-CM | POA: Diagnosis not present

## 2024-02-11 DIAGNOSIS — M6259 Muscle wasting and atrophy, not elsewhere classified, multiple sites: Secondary | ICD-10-CM | POA: Diagnosis not present

## 2024-02-11 DIAGNOSIS — M62521 Muscle wasting and atrophy, not elsewhere classified, right upper arm: Secondary | ICD-10-CM | POA: Diagnosis not present

## 2024-02-11 DIAGNOSIS — R296 Repeated falls: Secondary | ICD-10-CM | POA: Diagnosis not present

## 2024-02-11 DIAGNOSIS — R2681 Unsteadiness on feet: Secondary | ICD-10-CM | POA: Diagnosis not present

## 2024-02-12 ENCOUNTER — Ambulatory Visit: Payer: Medicare PPO | Admitting: Podiatry

## 2024-02-12 DIAGNOSIS — R4789 Other speech disturbances: Secondary | ICD-10-CM | POA: Diagnosis not present

## 2024-02-12 DIAGNOSIS — Z515 Encounter for palliative care: Secondary | ICD-10-CM | POA: Diagnosis not present

## 2024-02-12 DIAGNOSIS — G20A1 Parkinson's disease without dyskinesia, without mention of fluctuations: Secondary | ICD-10-CM | POA: Diagnosis not present

## 2024-02-12 DIAGNOSIS — R4185 Anosognosia: Secondary | ICD-10-CM | POA: Diagnosis not present

## 2024-02-12 DIAGNOSIS — R488 Other symbolic dysfunctions: Secondary | ICD-10-CM | POA: Diagnosis not present

## 2024-02-12 DIAGNOSIS — R1312 Dysphagia, oropharyngeal phase: Secondary | ICD-10-CM | POA: Diagnosis not present

## 2024-02-12 DIAGNOSIS — R634 Abnormal weight loss: Secondary | ICD-10-CM | POA: Diagnosis not present

## 2024-02-13 DIAGNOSIS — R278 Other lack of coordination: Secondary | ICD-10-CM | POA: Diagnosis not present

## 2024-02-13 DIAGNOSIS — R488 Other symbolic dysfunctions: Secondary | ICD-10-CM | POA: Diagnosis not present

## 2024-02-13 DIAGNOSIS — R2689 Other abnormalities of gait and mobility: Secondary | ICD-10-CM | POA: Diagnosis not present

## 2024-02-13 DIAGNOSIS — R2681 Unsteadiness on feet: Secondary | ICD-10-CM | POA: Diagnosis not present

## 2024-02-13 DIAGNOSIS — M62521 Muscle wasting and atrophy, not elsewhere classified, right upper arm: Secondary | ICD-10-CM | POA: Diagnosis not present

## 2024-02-13 DIAGNOSIS — M62522 Muscle wasting and atrophy, not elsewhere classified, left upper arm: Secondary | ICD-10-CM | POA: Diagnosis not present

## 2024-02-13 DIAGNOSIS — M6259 Muscle wasting and atrophy, not elsewhere classified, multiple sites: Secondary | ICD-10-CM | POA: Diagnosis not present

## 2024-02-13 DIAGNOSIS — R296 Repeated falls: Secondary | ICD-10-CM | POA: Diagnosis not present

## 2024-02-14 DIAGNOSIS — R4789 Other speech disturbances: Secondary | ICD-10-CM | POA: Diagnosis not present

## 2024-02-14 DIAGNOSIS — R1312 Dysphagia, oropharyngeal phase: Secondary | ICD-10-CM | POA: Diagnosis not present

## 2024-02-14 DIAGNOSIS — R488 Other symbolic dysfunctions: Secondary | ICD-10-CM | POA: Diagnosis not present

## 2024-02-14 DIAGNOSIS — R4185 Anosognosia: Secondary | ICD-10-CM | POA: Diagnosis not present

## 2024-02-15 DIAGNOSIS — M6259 Muscle wasting and atrophy, not elsewhere classified, multiple sites: Secondary | ICD-10-CM | POA: Diagnosis not present

## 2024-02-15 DIAGNOSIS — R296 Repeated falls: Secondary | ICD-10-CM | POA: Diagnosis not present

## 2024-02-15 DIAGNOSIS — M62522 Muscle wasting and atrophy, not elsewhere classified, left upper arm: Secondary | ICD-10-CM | POA: Diagnosis not present

## 2024-02-15 DIAGNOSIS — R488 Other symbolic dysfunctions: Secondary | ICD-10-CM | POA: Diagnosis not present

## 2024-02-15 DIAGNOSIS — G20A1 Parkinson's disease without dyskinesia, without mention of fluctuations: Secondary | ICD-10-CM | POA: Diagnosis not present

## 2024-02-15 DIAGNOSIS — R2689 Other abnormalities of gait and mobility: Secondary | ICD-10-CM | POA: Diagnosis not present

## 2024-02-15 DIAGNOSIS — M62521 Muscle wasting and atrophy, not elsewhere classified, right upper arm: Secondary | ICD-10-CM | POA: Diagnosis not present

## 2024-02-15 DIAGNOSIS — R2681 Unsteadiness on feet: Secondary | ICD-10-CM | POA: Diagnosis not present

## 2024-02-15 DIAGNOSIS — R278 Other lack of coordination: Secondary | ICD-10-CM | POA: Diagnosis not present

## 2024-02-15 DIAGNOSIS — F02B18 Dementia in other diseases classified elsewhere, moderate, with other behavioral disturbance: Secondary | ICD-10-CM | POA: Diagnosis not present

## 2024-02-15 DIAGNOSIS — G20B1 Parkinson's disease with dyskinesia, without mention of fluctuations: Secondary | ICD-10-CM | POA: Diagnosis not present

## 2024-02-18 DIAGNOSIS — M6281 Muscle weakness (generalized): Secondary | ICD-10-CM | POA: Diagnosis not present

## 2024-02-18 DIAGNOSIS — M810 Age-related osteoporosis without current pathological fracture: Secondary | ICD-10-CM | POA: Diagnosis not present

## 2024-02-18 DIAGNOSIS — M5430 Sciatica, unspecified side: Secondary | ICD-10-CM | POA: Diagnosis not present

## 2024-02-18 DIAGNOSIS — G20A1 Parkinson's disease without dyskinesia, without mention of fluctuations: Secondary | ICD-10-CM | POA: Diagnosis not present

## 2024-02-19 DIAGNOSIS — M62522 Muscle wasting and atrophy, not elsewhere classified, left upper arm: Secondary | ICD-10-CM | POA: Diagnosis not present

## 2024-02-19 DIAGNOSIS — R4789 Other speech disturbances: Secondary | ICD-10-CM | POA: Diagnosis not present

## 2024-02-19 DIAGNOSIS — R2681 Unsteadiness on feet: Secondary | ICD-10-CM | POA: Diagnosis not present

## 2024-02-19 DIAGNOSIS — R278 Other lack of coordination: Secondary | ICD-10-CM | POA: Diagnosis not present

## 2024-02-19 DIAGNOSIS — M62521 Muscle wasting and atrophy, not elsewhere classified, right upper arm: Secondary | ICD-10-CM | POA: Diagnosis not present

## 2024-02-19 DIAGNOSIS — R1312 Dysphagia, oropharyngeal phase: Secondary | ICD-10-CM | POA: Diagnosis not present

## 2024-02-19 DIAGNOSIS — R296 Repeated falls: Secondary | ICD-10-CM | POA: Diagnosis not present

## 2024-02-19 DIAGNOSIS — R488 Other symbolic dysfunctions: Secondary | ICD-10-CM | POA: Diagnosis not present

## 2024-02-19 DIAGNOSIS — R4185 Anosognosia: Secondary | ICD-10-CM | POA: Diagnosis not present

## 2024-02-20 DIAGNOSIS — R2681 Unsteadiness on feet: Secondary | ICD-10-CM | POA: Diagnosis not present

## 2024-02-20 DIAGNOSIS — M6259 Muscle wasting and atrophy, not elsewhere classified, multiple sites: Secondary | ICD-10-CM | POA: Diagnosis not present

## 2024-02-20 DIAGNOSIS — R488 Other symbolic dysfunctions: Secondary | ICD-10-CM | POA: Diagnosis not present

## 2024-02-20 DIAGNOSIS — M62521 Muscle wasting and atrophy, not elsewhere classified, right upper arm: Secondary | ICD-10-CM | POA: Diagnosis not present

## 2024-02-20 DIAGNOSIS — R278 Other lack of coordination: Secondary | ICD-10-CM | POA: Diagnosis not present

## 2024-02-20 DIAGNOSIS — R296 Repeated falls: Secondary | ICD-10-CM | POA: Diagnosis not present

## 2024-02-20 DIAGNOSIS — M62522 Muscle wasting and atrophy, not elsewhere classified, left upper arm: Secondary | ICD-10-CM | POA: Diagnosis not present

## 2024-02-20 DIAGNOSIS — R2689 Other abnormalities of gait and mobility: Secondary | ICD-10-CM | POA: Diagnosis not present

## 2024-02-21 DIAGNOSIS — R1312 Dysphagia, oropharyngeal phase: Secondary | ICD-10-CM | POA: Diagnosis not present

## 2024-02-21 DIAGNOSIS — R4789 Other speech disturbances: Secondary | ICD-10-CM | POA: Diagnosis not present

## 2024-02-21 DIAGNOSIS — R2689 Other abnormalities of gait and mobility: Secondary | ICD-10-CM | POA: Diagnosis not present

## 2024-02-21 DIAGNOSIS — R4185 Anosognosia: Secondary | ICD-10-CM | POA: Diagnosis not present

## 2024-02-21 DIAGNOSIS — R278 Other lack of coordination: Secondary | ICD-10-CM | POA: Diagnosis not present

## 2024-02-21 DIAGNOSIS — M62521 Muscle wasting and atrophy, not elsewhere classified, right upper arm: Secondary | ICD-10-CM | POA: Diagnosis not present

## 2024-02-21 DIAGNOSIS — M62522 Muscle wasting and atrophy, not elsewhere classified, left upper arm: Secondary | ICD-10-CM | POA: Diagnosis not present

## 2024-02-21 DIAGNOSIS — R488 Other symbolic dysfunctions: Secondary | ICD-10-CM | POA: Diagnosis not present

## 2024-02-21 DIAGNOSIS — M6259 Muscle wasting and atrophy, not elsewhere classified, multiple sites: Secondary | ICD-10-CM | POA: Diagnosis not present

## 2024-02-21 DIAGNOSIS — R296 Repeated falls: Secondary | ICD-10-CM | POA: Diagnosis not present

## 2024-02-21 DIAGNOSIS — R2681 Unsteadiness on feet: Secondary | ICD-10-CM | POA: Diagnosis not present

## 2024-02-22 DIAGNOSIS — M6259 Muscle wasting and atrophy, not elsewhere classified, multiple sites: Secondary | ICD-10-CM | POA: Diagnosis not present

## 2024-02-22 DIAGNOSIS — R296 Repeated falls: Secondary | ICD-10-CM | POA: Diagnosis not present

## 2024-02-22 DIAGNOSIS — R278 Other lack of coordination: Secondary | ICD-10-CM | POA: Diagnosis not present

## 2024-02-22 DIAGNOSIS — R2689 Other abnormalities of gait and mobility: Secondary | ICD-10-CM | POA: Diagnosis not present

## 2024-02-25 DIAGNOSIS — R278 Other lack of coordination: Secondary | ICD-10-CM | POA: Diagnosis not present

## 2024-02-25 DIAGNOSIS — M62522 Muscle wasting and atrophy, not elsewhere classified, left upper arm: Secondary | ICD-10-CM | POA: Diagnosis not present

## 2024-02-25 DIAGNOSIS — M62521 Muscle wasting and atrophy, not elsewhere classified, right upper arm: Secondary | ICD-10-CM | POA: Diagnosis not present

## 2024-02-25 DIAGNOSIS — R488 Other symbolic dysfunctions: Secondary | ICD-10-CM | POA: Diagnosis not present

## 2024-02-25 DIAGNOSIS — R296 Repeated falls: Secondary | ICD-10-CM | POA: Diagnosis not present

## 2024-02-25 DIAGNOSIS — R2681 Unsteadiness on feet: Secondary | ICD-10-CM | POA: Diagnosis not present

## 2024-02-26 DIAGNOSIS — R488 Other symbolic dysfunctions: Secondary | ICD-10-CM | POA: Diagnosis not present

## 2024-02-26 DIAGNOSIS — M6259 Muscle wasting and atrophy, not elsewhere classified, multiple sites: Secondary | ICD-10-CM | POA: Diagnosis not present

## 2024-02-26 DIAGNOSIS — R4789 Other speech disturbances: Secondary | ICD-10-CM | POA: Diagnosis not present

## 2024-02-26 DIAGNOSIS — R1312 Dysphagia, oropharyngeal phase: Secondary | ICD-10-CM | POA: Diagnosis not present

## 2024-02-26 DIAGNOSIS — R278 Other lack of coordination: Secondary | ICD-10-CM | POA: Diagnosis not present

## 2024-02-26 DIAGNOSIS — R4185 Anosognosia: Secondary | ICD-10-CM | POA: Diagnosis not present

## 2024-02-26 DIAGNOSIS — R296 Repeated falls: Secondary | ICD-10-CM | POA: Diagnosis not present

## 2024-02-26 DIAGNOSIS — R2689 Other abnormalities of gait and mobility: Secondary | ICD-10-CM | POA: Diagnosis not present

## 2024-02-27 DIAGNOSIS — M62522 Muscle wasting and atrophy, not elsewhere classified, left upper arm: Secondary | ICD-10-CM | POA: Diagnosis not present

## 2024-02-27 DIAGNOSIS — M6259 Muscle wasting and atrophy, not elsewhere classified, multiple sites: Secondary | ICD-10-CM | POA: Diagnosis not present

## 2024-02-27 DIAGNOSIS — R488 Other symbolic dysfunctions: Secondary | ICD-10-CM | POA: Diagnosis not present

## 2024-02-27 DIAGNOSIS — R2689 Other abnormalities of gait and mobility: Secondary | ICD-10-CM | POA: Diagnosis not present

## 2024-02-27 DIAGNOSIS — R2681 Unsteadiness on feet: Secondary | ICD-10-CM | POA: Diagnosis not present

## 2024-02-27 DIAGNOSIS — M62521 Muscle wasting and atrophy, not elsewhere classified, right upper arm: Secondary | ICD-10-CM | POA: Diagnosis not present

## 2024-02-27 DIAGNOSIS — R296 Repeated falls: Secondary | ICD-10-CM | POA: Diagnosis not present

## 2024-02-27 DIAGNOSIS — R278 Other lack of coordination: Secondary | ICD-10-CM | POA: Diagnosis not present

## 2024-02-28 DIAGNOSIS — R4185 Anosognosia: Secondary | ICD-10-CM | POA: Diagnosis not present

## 2024-02-28 DIAGNOSIS — R4789 Other speech disturbances: Secondary | ICD-10-CM | POA: Diagnosis not present

## 2024-02-28 DIAGNOSIS — R1312 Dysphagia, oropharyngeal phase: Secondary | ICD-10-CM | POA: Diagnosis not present

## 2024-02-28 DIAGNOSIS — R488 Other symbolic dysfunctions: Secondary | ICD-10-CM | POA: Diagnosis not present

## 2024-02-29 ENCOUNTER — Ambulatory Visit: Admitting: Podiatry

## 2024-02-29 DIAGNOSIS — R2689 Other abnormalities of gait and mobility: Secondary | ICD-10-CM | POA: Diagnosis not present

## 2024-02-29 DIAGNOSIS — R278 Other lack of coordination: Secondary | ICD-10-CM | POA: Diagnosis not present

## 2024-02-29 DIAGNOSIS — M62522 Muscle wasting and atrophy, not elsewhere classified, left upper arm: Secondary | ICD-10-CM | POA: Diagnosis not present

## 2024-02-29 DIAGNOSIS — M6259 Muscle wasting and atrophy, not elsewhere classified, multiple sites: Secondary | ICD-10-CM | POA: Diagnosis not present

## 2024-02-29 DIAGNOSIS — R488 Other symbolic dysfunctions: Secondary | ICD-10-CM | POA: Diagnosis not present

## 2024-02-29 DIAGNOSIS — R296 Repeated falls: Secondary | ICD-10-CM | POA: Diagnosis not present

## 2024-02-29 DIAGNOSIS — R2681 Unsteadiness on feet: Secondary | ICD-10-CM | POA: Diagnosis not present

## 2024-02-29 DIAGNOSIS — M62521 Muscle wasting and atrophy, not elsewhere classified, right upper arm: Secondary | ICD-10-CM | POA: Diagnosis not present

## 2024-03-03 DIAGNOSIS — R278 Other lack of coordination: Secondary | ICD-10-CM | POA: Diagnosis not present

## 2024-03-03 DIAGNOSIS — R4185 Anosognosia: Secondary | ICD-10-CM | POA: Diagnosis not present

## 2024-03-03 DIAGNOSIS — R1312 Dysphagia, oropharyngeal phase: Secondary | ICD-10-CM | POA: Diagnosis not present

## 2024-03-03 DIAGNOSIS — R2681 Unsteadiness on feet: Secondary | ICD-10-CM | POA: Diagnosis not present

## 2024-03-03 DIAGNOSIS — R4789 Other speech disturbances: Secondary | ICD-10-CM | POA: Diagnosis not present

## 2024-03-03 DIAGNOSIS — R296 Repeated falls: Secondary | ICD-10-CM | POA: Diagnosis not present

## 2024-03-03 DIAGNOSIS — M62521 Muscle wasting and atrophy, not elsewhere classified, right upper arm: Secondary | ICD-10-CM | POA: Diagnosis not present

## 2024-03-03 DIAGNOSIS — M6259 Muscle wasting and atrophy, not elsewhere classified, multiple sites: Secondary | ICD-10-CM | POA: Diagnosis not present

## 2024-03-03 DIAGNOSIS — R2689 Other abnormalities of gait and mobility: Secondary | ICD-10-CM | POA: Diagnosis not present

## 2024-03-03 DIAGNOSIS — R488 Other symbolic dysfunctions: Secondary | ICD-10-CM | POA: Diagnosis not present

## 2024-03-03 DIAGNOSIS — M62522 Muscle wasting and atrophy, not elsewhere classified, left upper arm: Secondary | ICD-10-CM | POA: Diagnosis not present

## 2024-03-04 DIAGNOSIS — R2681 Unsteadiness on feet: Secondary | ICD-10-CM | POA: Diagnosis not present

## 2024-03-04 DIAGNOSIS — R296 Repeated falls: Secondary | ICD-10-CM | POA: Diagnosis not present

## 2024-03-04 DIAGNOSIS — M62521 Muscle wasting and atrophy, not elsewhere classified, right upper arm: Secondary | ICD-10-CM | POA: Diagnosis not present

## 2024-03-04 DIAGNOSIS — R488 Other symbolic dysfunctions: Secondary | ICD-10-CM | POA: Diagnosis not present

## 2024-03-04 DIAGNOSIS — M62522 Muscle wasting and atrophy, not elsewhere classified, left upper arm: Secondary | ICD-10-CM | POA: Diagnosis not present

## 2024-03-04 DIAGNOSIS — R278 Other lack of coordination: Secondary | ICD-10-CM | POA: Diagnosis not present

## 2024-03-05 DIAGNOSIS — R296 Repeated falls: Secondary | ICD-10-CM | POA: Diagnosis not present

## 2024-03-05 DIAGNOSIS — R4789 Other speech disturbances: Secondary | ICD-10-CM | POA: Diagnosis not present

## 2024-03-05 DIAGNOSIS — R488 Other symbolic dysfunctions: Secondary | ICD-10-CM | POA: Diagnosis not present

## 2024-03-05 DIAGNOSIS — R2689 Other abnormalities of gait and mobility: Secondary | ICD-10-CM | POA: Diagnosis not present

## 2024-03-05 DIAGNOSIS — R278 Other lack of coordination: Secondary | ICD-10-CM | POA: Diagnosis not present

## 2024-03-05 DIAGNOSIS — R4185 Anosognosia: Secondary | ICD-10-CM | POA: Diagnosis not present

## 2024-03-05 DIAGNOSIS — R1312 Dysphagia, oropharyngeal phase: Secondary | ICD-10-CM | POA: Diagnosis not present

## 2024-03-05 DIAGNOSIS — M6259 Muscle wasting and atrophy, not elsewhere classified, multiple sites: Secondary | ICD-10-CM | POA: Diagnosis not present

## 2024-03-06 DIAGNOSIS — R4185 Anosognosia: Secondary | ICD-10-CM | POA: Diagnosis not present

## 2024-03-06 DIAGNOSIS — R1312 Dysphagia, oropharyngeal phase: Secondary | ICD-10-CM | POA: Diagnosis not present

## 2024-03-06 DIAGNOSIS — R296 Repeated falls: Secondary | ICD-10-CM | POA: Diagnosis not present

## 2024-03-06 DIAGNOSIS — R488 Other symbolic dysfunctions: Secondary | ICD-10-CM | POA: Diagnosis not present

## 2024-03-06 DIAGNOSIS — R4789 Other speech disturbances: Secondary | ICD-10-CM | POA: Diagnosis not present

## 2024-03-06 DIAGNOSIS — M62522 Muscle wasting and atrophy, not elsewhere classified, left upper arm: Secondary | ICD-10-CM | POA: Diagnosis not present

## 2024-03-06 DIAGNOSIS — M62521 Muscle wasting and atrophy, not elsewhere classified, right upper arm: Secondary | ICD-10-CM | POA: Diagnosis not present

## 2024-03-06 DIAGNOSIS — R2681 Unsteadiness on feet: Secondary | ICD-10-CM | POA: Diagnosis not present

## 2024-03-06 DIAGNOSIS — R278 Other lack of coordination: Secondary | ICD-10-CM | POA: Diagnosis not present

## 2024-03-07 DIAGNOSIS — R2689 Other abnormalities of gait and mobility: Secondary | ICD-10-CM | POA: Diagnosis not present

## 2024-03-07 DIAGNOSIS — R278 Other lack of coordination: Secondary | ICD-10-CM | POA: Diagnosis not present

## 2024-03-07 DIAGNOSIS — M6259 Muscle wasting and atrophy, not elsewhere classified, multiple sites: Secondary | ICD-10-CM | POA: Diagnosis not present

## 2024-03-07 DIAGNOSIS — R296 Repeated falls: Secondary | ICD-10-CM | POA: Diagnosis not present

## 2024-03-10 ENCOUNTER — Ambulatory Visit: Admitting: Podiatry

## 2024-03-10 ENCOUNTER — Encounter: Payer: Self-pay | Admitting: Podiatry

## 2024-03-10 DIAGNOSIS — M79609 Pain in unspecified limb: Secondary | ICD-10-CM | POA: Diagnosis not present

## 2024-03-10 DIAGNOSIS — H9190 Unspecified hearing loss, unspecified ear: Secondary | ICD-10-CM

## 2024-03-10 DIAGNOSIS — G20B2 Parkinson's disease with dyskinesia, with fluctuations: Secondary | ICD-10-CM

## 2024-03-10 DIAGNOSIS — B351 Tinea unguium: Secondary | ICD-10-CM | POA: Diagnosis not present

## 2024-03-10 NOTE — Progress Notes (Signed)

## 2024-03-11 DIAGNOSIS — I10 Essential (primary) hypertension: Secondary | ICD-10-CM | POA: Diagnosis not present

## 2024-03-11 DIAGNOSIS — F419 Anxiety disorder, unspecified: Secondary | ICD-10-CM | POA: Diagnosis not present

## 2024-03-11 DIAGNOSIS — G20A1 Parkinson's disease without dyskinesia, without mention of fluctuations: Secondary | ICD-10-CM | POA: Diagnosis not present

## 2024-03-11 DIAGNOSIS — R490 Dysphonia: Secondary | ICD-10-CM | POA: Diagnosis not present

## 2024-03-11 DIAGNOSIS — M810 Age-related osteoporosis without current pathological fracture: Secondary | ICD-10-CM | POA: Diagnosis not present

## 2024-03-11 DIAGNOSIS — H6120 Impacted cerumen, unspecified ear: Secondary | ICD-10-CM | POA: Diagnosis not present

## 2024-03-11 DIAGNOSIS — H9192 Unspecified hearing loss, left ear: Secondary | ICD-10-CM | POA: Diagnosis not present

## 2024-03-11 DIAGNOSIS — E46 Unspecified protein-calorie malnutrition: Secondary | ICD-10-CM | POA: Diagnosis not present

## 2024-03-12 DIAGNOSIS — Z111 Encounter for screening for respiratory tuberculosis: Secondary | ICD-10-CM | POA: Diagnosis not present

## 2024-03-12 DIAGNOSIS — M6259 Muscle wasting and atrophy, not elsewhere classified, multiple sites: Secondary | ICD-10-CM | POA: Diagnosis not present

## 2024-03-12 DIAGNOSIS — R2689 Other abnormalities of gait and mobility: Secondary | ICD-10-CM | POA: Diagnosis not present

## 2024-03-12 DIAGNOSIS — R296 Repeated falls: Secondary | ICD-10-CM | POA: Diagnosis not present

## 2024-03-12 DIAGNOSIS — R278 Other lack of coordination: Secondary | ICD-10-CM | POA: Diagnosis not present

## 2024-03-13 DIAGNOSIS — R2681 Unsteadiness on feet: Secondary | ICD-10-CM | POA: Diagnosis not present

## 2024-03-13 DIAGNOSIS — R1312 Dysphagia, oropharyngeal phase: Secondary | ICD-10-CM | POA: Diagnosis not present

## 2024-03-13 DIAGNOSIS — R278 Other lack of coordination: Secondary | ICD-10-CM | POA: Diagnosis not present

## 2024-03-13 DIAGNOSIS — M62522 Muscle wasting and atrophy, not elsewhere classified, left upper arm: Secondary | ICD-10-CM | POA: Diagnosis not present

## 2024-03-13 DIAGNOSIS — R2689 Other abnormalities of gait and mobility: Secondary | ICD-10-CM | POA: Diagnosis not present

## 2024-03-13 DIAGNOSIS — R488 Other symbolic dysfunctions: Secondary | ICD-10-CM | POA: Diagnosis not present

## 2024-03-13 DIAGNOSIS — M6259 Muscle wasting and atrophy, not elsewhere classified, multiple sites: Secondary | ICD-10-CM | POA: Diagnosis not present

## 2024-03-13 DIAGNOSIS — R4789 Other speech disturbances: Secondary | ICD-10-CM | POA: Diagnosis not present

## 2024-03-13 DIAGNOSIS — R296 Repeated falls: Secondary | ICD-10-CM | POA: Diagnosis not present

## 2024-03-13 DIAGNOSIS — M62521 Muscle wasting and atrophy, not elsewhere classified, right upper arm: Secondary | ICD-10-CM | POA: Diagnosis not present

## 2024-03-13 DIAGNOSIS — R4185 Anosognosia: Secondary | ICD-10-CM | POA: Diagnosis not present

## 2024-03-14 DIAGNOSIS — M6259 Muscle wasting and atrophy, not elsewhere classified, multiple sites: Secondary | ICD-10-CM | POA: Diagnosis not present

## 2024-03-14 DIAGNOSIS — R2689 Other abnormalities of gait and mobility: Secondary | ICD-10-CM | POA: Diagnosis not present

## 2024-03-14 DIAGNOSIS — R296 Repeated falls: Secondary | ICD-10-CM | POA: Diagnosis not present

## 2024-03-14 DIAGNOSIS — M62522 Muscle wasting and atrophy, not elsewhere classified, left upper arm: Secondary | ICD-10-CM | POA: Diagnosis not present

## 2024-03-14 DIAGNOSIS — R488 Other symbolic dysfunctions: Secondary | ICD-10-CM | POA: Diagnosis not present

## 2024-03-14 DIAGNOSIS — R278 Other lack of coordination: Secondary | ICD-10-CM | POA: Diagnosis not present

## 2024-03-14 DIAGNOSIS — R2681 Unsteadiness on feet: Secondary | ICD-10-CM | POA: Diagnosis not present

## 2024-03-14 DIAGNOSIS — M62521 Muscle wasting and atrophy, not elsewhere classified, right upper arm: Secondary | ICD-10-CM | POA: Diagnosis not present

## 2024-03-14 DIAGNOSIS — R1312 Dysphagia, oropharyngeal phase: Secondary | ICD-10-CM | POA: Diagnosis not present

## 2024-03-14 DIAGNOSIS — R4789 Other speech disturbances: Secondary | ICD-10-CM | POA: Diagnosis not present

## 2024-03-14 DIAGNOSIS — R4185 Anosognosia: Secondary | ICD-10-CM | POA: Diagnosis not present

## 2024-03-17 DIAGNOSIS — M62522 Muscle wasting and atrophy, not elsewhere classified, left upper arm: Secondary | ICD-10-CM | POA: Diagnosis not present

## 2024-03-17 DIAGNOSIS — M62521 Muscle wasting and atrophy, not elsewhere classified, right upper arm: Secondary | ICD-10-CM | POA: Diagnosis not present

## 2024-03-17 DIAGNOSIS — R488 Other symbolic dysfunctions: Secondary | ICD-10-CM | POA: Diagnosis not present

## 2024-03-17 DIAGNOSIS — R4185 Anosognosia: Secondary | ICD-10-CM | POA: Diagnosis not present

## 2024-03-17 DIAGNOSIS — M6259 Muscle wasting and atrophy, not elsewhere classified, multiple sites: Secondary | ICD-10-CM | POA: Diagnosis not present

## 2024-03-17 DIAGNOSIS — R4789 Other speech disturbances: Secondary | ICD-10-CM | POA: Diagnosis not present

## 2024-03-17 DIAGNOSIS — R278 Other lack of coordination: Secondary | ICD-10-CM | POA: Diagnosis not present

## 2024-03-17 DIAGNOSIS — R1312 Dysphagia, oropharyngeal phase: Secondary | ICD-10-CM | POA: Diagnosis not present

## 2024-03-17 DIAGNOSIS — R296 Repeated falls: Secondary | ICD-10-CM | POA: Diagnosis not present

## 2024-03-17 DIAGNOSIS — R2689 Other abnormalities of gait and mobility: Secondary | ICD-10-CM | POA: Diagnosis not present

## 2024-03-17 DIAGNOSIS — R2681 Unsteadiness on feet: Secondary | ICD-10-CM | POA: Diagnosis not present

## 2024-03-18 DIAGNOSIS — R488 Other symbolic dysfunctions: Secondary | ICD-10-CM | POA: Diagnosis not present

## 2024-03-18 DIAGNOSIS — M62522 Muscle wasting and atrophy, not elsewhere classified, left upper arm: Secondary | ICD-10-CM | POA: Diagnosis not present

## 2024-03-18 DIAGNOSIS — R2681 Unsteadiness on feet: Secondary | ICD-10-CM | POA: Diagnosis not present

## 2024-03-18 DIAGNOSIS — M6259 Muscle wasting and atrophy, not elsewhere classified, multiple sites: Secondary | ICD-10-CM | POA: Diagnosis not present

## 2024-03-18 DIAGNOSIS — R2689 Other abnormalities of gait and mobility: Secondary | ICD-10-CM | POA: Diagnosis not present

## 2024-03-18 DIAGNOSIS — M62521 Muscle wasting and atrophy, not elsewhere classified, right upper arm: Secondary | ICD-10-CM | POA: Diagnosis not present

## 2024-03-18 DIAGNOSIS — R296 Repeated falls: Secondary | ICD-10-CM | POA: Diagnosis not present

## 2024-03-18 DIAGNOSIS — R278 Other lack of coordination: Secondary | ICD-10-CM | POA: Diagnosis not present

## 2024-03-19 DIAGNOSIS — R4185 Anosognosia: Secondary | ICD-10-CM | POA: Diagnosis not present

## 2024-03-19 DIAGNOSIS — R4789 Other speech disturbances: Secondary | ICD-10-CM | POA: Diagnosis not present

## 2024-03-19 DIAGNOSIS — R488 Other symbolic dysfunctions: Secondary | ICD-10-CM | POA: Diagnosis not present

## 2024-03-19 DIAGNOSIS — R1312 Dysphagia, oropharyngeal phase: Secondary | ICD-10-CM | POA: Diagnosis not present

## 2024-03-20 DIAGNOSIS — M5430 Sciatica, unspecified side: Secondary | ICD-10-CM | POA: Diagnosis not present

## 2024-03-20 DIAGNOSIS — M810 Age-related osteoporosis without current pathological fracture: Secondary | ICD-10-CM | POA: Diagnosis not present

## 2024-03-20 DIAGNOSIS — G20A1 Parkinson's disease without dyskinesia, without mention of fluctuations: Secondary | ICD-10-CM | POA: Diagnosis not present

## 2024-03-20 DIAGNOSIS — M6281 Muscle weakness (generalized): Secondary | ICD-10-CM | POA: Diagnosis not present

## 2024-03-21 DIAGNOSIS — R2689 Other abnormalities of gait and mobility: Secondary | ICD-10-CM | POA: Diagnosis not present

## 2024-03-21 DIAGNOSIS — R296 Repeated falls: Secondary | ICD-10-CM | POA: Diagnosis not present

## 2024-03-21 DIAGNOSIS — M62521 Muscle wasting and atrophy, not elsewhere classified, right upper arm: Secondary | ICD-10-CM | POA: Diagnosis not present

## 2024-03-21 DIAGNOSIS — R2681 Unsteadiness on feet: Secondary | ICD-10-CM | POA: Diagnosis not present

## 2024-03-21 DIAGNOSIS — M6259 Muscle wasting and atrophy, not elsewhere classified, multiple sites: Secondary | ICD-10-CM | POA: Diagnosis not present

## 2024-03-21 DIAGNOSIS — M62522 Muscle wasting and atrophy, not elsewhere classified, left upper arm: Secondary | ICD-10-CM | POA: Diagnosis not present

## 2024-03-21 DIAGNOSIS — R488 Other symbolic dysfunctions: Secondary | ICD-10-CM | POA: Diagnosis not present

## 2024-03-21 DIAGNOSIS — R278 Other lack of coordination: Secondary | ICD-10-CM | POA: Diagnosis not present

## 2024-03-24 DIAGNOSIS — R296 Repeated falls: Secondary | ICD-10-CM | POA: Diagnosis not present

## 2024-03-24 DIAGNOSIS — R278 Other lack of coordination: Secondary | ICD-10-CM | POA: Diagnosis not present

## 2024-03-24 DIAGNOSIS — R2689 Other abnormalities of gait and mobility: Secondary | ICD-10-CM | POA: Diagnosis not present

## 2024-03-24 DIAGNOSIS — M6259 Muscle wasting and atrophy, not elsewhere classified, multiple sites: Secondary | ICD-10-CM | POA: Diagnosis not present

## 2024-03-24 DIAGNOSIS — R2681 Unsteadiness on feet: Secondary | ICD-10-CM | POA: Diagnosis not present

## 2024-03-24 DIAGNOSIS — R488 Other symbolic dysfunctions: Secondary | ICD-10-CM | POA: Diagnosis not present

## 2024-03-24 DIAGNOSIS — M62521 Muscle wasting and atrophy, not elsewhere classified, right upper arm: Secondary | ICD-10-CM | POA: Diagnosis not present

## 2024-03-24 DIAGNOSIS — M62522 Muscle wasting and atrophy, not elsewhere classified, left upper arm: Secondary | ICD-10-CM | POA: Diagnosis not present

## 2024-03-25 DIAGNOSIS — R296 Repeated falls: Secondary | ICD-10-CM | POA: Diagnosis not present

## 2024-03-25 DIAGNOSIS — R278 Other lack of coordination: Secondary | ICD-10-CM | POA: Diagnosis not present

## 2024-03-25 DIAGNOSIS — M6259 Muscle wasting and atrophy, not elsewhere classified, multiple sites: Secondary | ICD-10-CM | POA: Diagnosis not present

## 2024-03-25 DIAGNOSIS — R2689 Other abnormalities of gait and mobility: Secondary | ICD-10-CM | POA: Diagnosis not present

## 2024-03-26 DIAGNOSIS — M62521 Muscle wasting and atrophy, not elsewhere classified, right upper arm: Secondary | ICD-10-CM | POA: Diagnosis not present

## 2024-03-26 DIAGNOSIS — R2681 Unsteadiness on feet: Secondary | ICD-10-CM | POA: Diagnosis not present

## 2024-03-26 DIAGNOSIS — R296 Repeated falls: Secondary | ICD-10-CM | POA: Diagnosis not present

## 2024-03-26 DIAGNOSIS — R4185 Anosognosia: Secondary | ICD-10-CM | POA: Diagnosis not present

## 2024-03-26 DIAGNOSIS — R1312 Dysphagia, oropharyngeal phase: Secondary | ICD-10-CM | POA: Diagnosis not present

## 2024-03-26 DIAGNOSIS — R488 Other symbolic dysfunctions: Secondary | ICD-10-CM | POA: Diagnosis not present

## 2024-03-26 DIAGNOSIS — M62522 Muscle wasting and atrophy, not elsewhere classified, left upper arm: Secondary | ICD-10-CM | POA: Diagnosis not present

## 2024-03-26 DIAGNOSIS — R278 Other lack of coordination: Secondary | ICD-10-CM | POA: Diagnosis not present

## 2024-03-26 DIAGNOSIS — R4789 Other speech disturbances: Secondary | ICD-10-CM | POA: Diagnosis not present

## 2024-03-27 DIAGNOSIS — R296 Repeated falls: Secondary | ICD-10-CM | POA: Diagnosis not present

## 2024-03-27 DIAGNOSIS — R1312 Dysphagia, oropharyngeal phase: Secondary | ICD-10-CM | POA: Diagnosis not present

## 2024-03-27 DIAGNOSIS — R4789 Other speech disturbances: Secondary | ICD-10-CM | POA: Diagnosis not present

## 2024-03-27 DIAGNOSIS — M62522 Muscle wasting and atrophy, not elsewhere classified, left upper arm: Secondary | ICD-10-CM | POA: Diagnosis not present

## 2024-03-27 DIAGNOSIS — R488 Other symbolic dysfunctions: Secondary | ICD-10-CM | POA: Diagnosis not present

## 2024-03-27 DIAGNOSIS — M62521 Muscle wasting and atrophy, not elsewhere classified, right upper arm: Secondary | ICD-10-CM | POA: Diagnosis not present

## 2024-03-27 DIAGNOSIS — R4185 Anosognosia: Secondary | ICD-10-CM | POA: Diagnosis not present

## 2024-03-27 DIAGNOSIS — R278 Other lack of coordination: Secondary | ICD-10-CM | POA: Diagnosis not present

## 2024-03-27 DIAGNOSIS — R2681 Unsteadiness on feet: Secondary | ICD-10-CM | POA: Diagnosis not present

## 2024-04-09 DIAGNOSIS — Z515 Encounter for palliative care: Secondary | ICD-10-CM | POA: Diagnosis not present

## 2024-04-09 DIAGNOSIS — G20A1 Parkinson's disease without dyskinesia, without mention of fluctuations: Secondary | ICD-10-CM | POA: Diagnosis not present

## 2024-04-09 DIAGNOSIS — R634 Abnormal weight loss: Secondary | ICD-10-CM | POA: Diagnosis not present

## 2024-04-16 ENCOUNTER — Observation Stay (HOSPITAL_COMMUNITY)
Admission: EM | Admit: 2024-04-16 | Discharge: 2024-04-18 | Disposition: A | Discharge status: Skilled Nursing Facility | Attending: Internal Medicine | Admitting: Internal Medicine

## 2024-04-16 ENCOUNTER — Other Ambulatory Visit: Payer: Self-pay

## 2024-04-16 ENCOUNTER — Encounter (HOSPITAL_COMMUNITY): Payer: Self-pay | Admitting: Emergency Medicine

## 2024-04-16 ENCOUNTER — Emergency Department (HOSPITAL_COMMUNITY)

## 2024-04-16 DIAGNOSIS — K5289 Other specified noninfective gastroenteritis and colitis: Secondary | ICD-10-CM | POA: Diagnosis not present

## 2024-04-16 DIAGNOSIS — K5900 Constipation, unspecified: Secondary | ICD-10-CM | POA: Insufficient documentation

## 2024-04-16 DIAGNOSIS — I10 Essential (primary) hypertension: Secondary | ICD-10-CM | POA: Diagnosis not present

## 2024-04-16 DIAGNOSIS — E538 Deficiency of other specified B group vitamins: Secondary | ICD-10-CM | POA: Insufficient documentation

## 2024-04-16 DIAGNOSIS — G20C Parkinsonism, unspecified: Secondary | ICD-10-CM | POA: Diagnosis not present

## 2024-04-16 DIAGNOSIS — Z7982 Long term (current) use of aspirin: Secondary | ICD-10-CM | POA: Diagnosis not present

## 2024-04-16 DIAGNOSIS — Z9181 History of falling: Secondary | ICD-10-CM | POA: Insufficient documentation

## 2024-04-16 DIAGNOSIS — Z1152 Encounter for screening for COVID-19: Secondary | ICD-10-CM | POA: Diagnosis not present

## 2024-04-16 DIAGNOSIS — E441 Mild protein-calorie malnutrition: Secondary | ICD-10-CM | POA: Insufficient documentation

## 2024-04-16 DIAGNOSIS — F0390 Unspecified dementia without behavioral disturbance: Secondary | ICD-10-CM | POA: Diagnosis not present

## 2024-04-16 DIAGNOSIS — K5641 Fecal impaction: Secondary | ICD-10-CM | POA: Diagnosis present

## 2024-04-16 DIAGNOSIS — K5909 Other constipation: Secondary | ICD-10-CM | POA: Diagnosis present

## 2024-04-16 DIAGNOSIS — G20A1 Parkinson's disease without dyskinesia, without mention of fluctuations: Secondary | ICD-10-CM | POA: Diagnosis present

## 2024-04-16 DIAGNOSIS — R1084 Generalized abdominal pain: Secondary | ICD-10-CM | POA: Diagnosis not present

## 2024-04-16 DIAGNOSIS — Z90710 Acquired absence of both cervix and uterus: Secondary | ICD-10-CM | POA: Diagnosis not present

## 2024-04-16 DIAGNOSIS — R109 Unspecified abdominal pain: Secondary | ICD-10-CM | POA: Diagnosis not present

## 2024-04-16 DIAGNOSIS — F39 Unspecified mood [affective] disorder: Secondary | ICD-10-CM | POA: Insufficient documentation

## 2024-04-16 DIAGNOSIS — K529 Noninfective gastroenteritis and colitis, unspecified: Secondary | ICD-10-CM | POA: Diagnosis not present

## 2024-04-16 LAB — BASIC METABOLIC PANEL WITH GFR
Anion gap: 8 (ref 5–15)
BUN: 17 mg/dL (ref 8–23)
CO2: 24 mmol/L (ref 22–32)
Calcium: 9.5 mg/dL (ref 8.9–10.3)
Chloride: 107 mmol/L (ref 98–111)
Creatinine, Ser: 0.67 mg/dL (ref 0.44–1.00)
GFR, Estimated: >60 mL/min (ref >60)
Glucose, Bld: 97 mg/dL (ref 70–99)
Potassium: 3.6 mmol/L (ref 3.5–5.1)
Sodium: 139 mmol/L (ref 135–145)

## 2024-04-16 LAB — CBC WITH DIFFERENTIAL/PLATELET
Abs Immature Granulocytes: 0.02 K/uL (ref 0.00–0.07)
Basophils Absolute: 0 K/uL (ref 0.0–0.1)
Basophils Relative: 0 %
Eosinophils Absolute: 0.1 K/uL (ref 0.0–0.5)
Eosinophils Relative: 1 %
HCT: 33.3 % — ABNORMAL LOW (ref 36.0–46.0)
Hemoglobin: 10.9 g/dL — ABNORMAL LOW (ref 12.0–15.0)
Immature Granulocytes: 0 %
Lymphocytes Relative: 22 %
Lymphs Abs: 1.3 K/uL (ref 0.7–4.0)
MCH: 30.4 pg (ref 26.0–34.0)
MCHC: 32.7 g/dL (ref 30.0–36.0)
MCV: 93 fL (ref 80.0–100.0)
Monocytes Absolute: 0.3 K/uL (ref 0.1–1.0)
Monocytes Relative: 5 %
Neutro Abs: 4.1 K/uL (ref 1.7–7.7)
Neutrophils Relative %: 72 %
Platelets: 214 K/uL (ref 150–400)
RBC: 3.58 MIL/uL — ABNORMAL LOW (ref 3.87–5.11)
RDW: 15.6 % — ABNORMAL HIGH (ref 11.5–15.5)
WBC: 5.8 K/uL (ref 4.0–10.5)
nRBC: 0 % (ref 0.0–0.2)

## 2024-04-16 MED ORDER — METRONIDAZOLE 500 MG/100ML IV SOLN
500.0000 mg | Freq: Two times a day (BID) | INTRAVENOUS | Status: DC
  Administered 2024-04-17 (×3): 500 mg via INTRAVENOUS
  Filled 2024-04-16 (×3): qty 100

## 2024-04-16 MED ORDER — FLEET ENEMA RE ENEM: 1.0000 | ENEMA | Freq: Once | RECTAL | Status: DC

## 2024-04-16 MED ORDER — SODIUM CHLORIDE 0.9 % IV SOLN
2.0000 g | INTRAVENOUS | Status: DC
  Administered 2024-04-16: 2 g via INTRAVENOUS
  Filled 2024-04-16: qty 20

## 2024-04-16 MED ORDER — DOCUSATE SODIUM 100 MG PO CAPS: 100.0000 mg | ORAL_CAPSULE | Freq: Once | ORAL | Status: DC

## 2024-04-16 MED ORDER — POLYETHYLENE GLYCOL 3350 17 G PO PACK: 17.0000 g | PACK | Freq: Every day | ORAL | Status: DC

## 2024-04-16 MED ORDER — MIDAZOLAM HCL 2 MG/2ML IJ SOLN
2.0000 mg | Freq: Once | INTRAMUSCULAR | Status: AC
  Administered 2024-04-16: 2 mg via INTRAVENOUS
  Filled 2024-04-16: qty 2

## 2024-04-16 MED ORDER — FENTANYL CITRATE PF 50 MCG/ML IJ SOSY
50.0000 ug | PREFILLED_SYRINGE | Freq: Once | INTRAMUSCULAR | Status: AC
  Administered 2024-04-16: 50 ug via INTRAVENOUS
  Filled 2024-04-16: qty 1

## 2024-04-16 NOTE — ED Notes (Signed)
Patient placed on bedpan per request.  

## 2024-04-16 NOTE — ED Provider Notes (Signed)
 [place] EMERGENCY DEPARTMENT AT [facility] Provider Note  CSN: [id number] Arrival date & time: 04/16/24 1946  Chief Complaint(s) Fecal Impaction  HPI Hannah Fisher is a 73 y.o. female who is here today because she is concerned that she may have a fecal impaction.  She has had this previously.  History of chronic constipation, Parkinson's disease.  Lives at a skilled nursing facility.  Last bowel movement patient states was 2 weeks ago.   Past Medical History Past Medical History:  Diagnosis Date   Arthritis    hands, knees   Deafness    left ear sue to shingles   Hypertension    Kidney stones    Osteoporosis 2009   -2.6   Shingles    Patient Active Problem List   Diagnosis Date Noted   Normocytic anemia 05/27/2023   Folate deficiency 05/27/2023   Hypophosphatemia 05/27/2023   Hyponatremia 05/27/2023   E. coli bacteremia, sepsis from right-sided pyelonephritis 05/22/2023   Hypokalemia 02/19/2023   HTN (hypertension) 02/19/2023   Weakness 02/19/2023   Impairment of balance 08/23/2021   Parkinson disease (HCC) 01/28/2021   Abnormal gait 01/24/2021   Allergic rhinitis 01/24/2021   Allergic rhinitis due to animal (cat) (dog) hair and dander 01/24/2021   Anxiety 01/24/2021   Chronic constipation 01/24/2021   Difficulty sleeping 01/24/2021   Gastritis 01/24/2021   High cholesterol 01/24/2021   Protein calorie malnutrition (HCC) 01/24/2021   Rectal bleeding 01/24/2021   Recurrent falls 01/24/2021   Restless legs 01/24/2021   Sciatica 01/24/2021   Standard chest x-ray abnormal 01/24/2021   Vitamin D deficiency 01/24/2021   Acute metabolic encephalopathy superimposed on cognitive impairment 12/28/2020   Rhabdomyolysis 12/28/2020   Lightheadedness 06/22/2020   Other abnormalities of gait and mobility 05/03/2020   Lumbar radiculopathy 04/28/2020   Dysphonia 12/03/2019   Age-related osteoporosis without current pathological fracture 11/25/2019    Dysfunction of both eustachian tubes 05/19/2019   Presbycusis of both ears 05/19/2019   Unilateral deafness, left 04/07/2019   Mass 04/17/2018   Deafness    Shingles    Osteoporosis    Hypertension    Home Medication(s) Prior to Admission medications   Medication Sig Start Date End Date Taking? Authorizing Provider  acetaminophen  (TYLENOL ) 325 MG tablet Take 2 tablets (650 mg total) by mouth every 6 (six) hours as needed for mild pain (or Fever >/= 101). 05/31/23   Danton Reyes DASEN, MD  ALPRAZolam  (XANAX ) 0.25 MG tablet Take 1 tablet (0.25 mg total) by mouth 2 (two) times daily. 05/31/23   Danton Reyes DASEN, MD  aspirin  81 MG EC tablet Take 81 mg by mouth daily.    [provider]  bisacodyl  (DULCOLAX) 10 MG suppository Place 1 suppository (10 mg total) rectally daily as needed for severe constipation. 05/31/23   Danton Reyes DASEN, MD  calcium  carbonate (CALCIUM  600) 600 MG TABS tablet Take 600 mg by mouth daily.    [provider]  carbidopa -levodopa  (SINEMET  IR) 25-100 MG tablet Take 1.5-2 tablets by mouth See admin instructions. Take 2 tablets by mouth at 9 AM & 12 NOON, then 1.5 tablets at 7 PM 09/18/22   [provider]  folic acid  (FOLVITE ) 800 MCG tablet Take 800 mcg by mouth daily.    [provider]  lisinopril  (ZESTRIL ) 2.5 MG tablet Take 2.5 mg by mouth daily.    [provider]  Magnesium  Glycinate 100 MG CAPS Take 200 mg by mouth daily.    [provider]  mirtazapine  (REMERON ) 7.5 MG tablet Take 7.5 mg by mouth at bedtime.    [provider]  Multiple Vitamin (MULTIVITAMIN WITH MINERALS) TABS tablet Take 1 tablet by mouth daily. 05/31/23   Danton Reyes DASEN, MD  polyethylene glycol (MIRALAX  / GLYCOLAX ) 17 g packet Take 17 g by mouth daily. 01/05/21   Antoinette Doe, MD  rOPINIRole  (REQUIP ) 0.25 MG tablet Take 0.25 mg by mouth every evening.    [provider]  thiamine  (VITAMIN B1) 100 MG tablet Take 200 mg  by mouth daily.    [provider]                                                                                                                                    Past Surgical History Past Surgical History:  Procedure Laterality Date   MASS EXCISION Right 05/30/2018   Procedure: EXCISION MASS RIGHT SMALL FINGER;  Surgeon: Murrell Kuba, MD;  Location: [facility];  Service: Orthopedics;  Laterality: Right;   TONSILLECTOMY     VAGINAL HYSTERECTOMY  1992   Family History Family History  Problem Relation Age of Onset   Hypertension Mother    Stroke Mother    Hypertension Sister    Hypertension Brother    Breast cancer Neg Hx     Social History Social History   Tobacco Use   Smoking status: Never   Smokeless tobacco: Never  Vaping Use   Vaping status: Never Used  Substance Use Topics   Alcohol use: Not Currently    Comment: ocaas   Drug use: No   Allergies Prednisone, Penicillins, Beta adrenergic blockers, and Sulfa antibiotics  Review of Systems Review of Systems  Physical Exam Vital Signs  I have reviewed the triage vital signs BP 123/75   Pulse (!) 55   Temp 98.2 F (36.8 C)   Resp (!) 8   Wt 45 kg   LMP 10/02/1990   SpO2 100%   BMI 18.74 kg/m   Physical Exam Vitals reviewed.  Eyes:     Pupils: Pupils are equal, round, and reactive to light.  Cardiovascular:     Rate and Rhythm: Normal rate.     Pulses: Normal pulses.  Pulmonary:     Effort: Pulmonary effort is normal.  Abdominal:     General: Abdomen is flat. There is no distension.     Tenderness: There is no abdominal tenderness.  Neurological:     Mental Status: She is alert.     ED Results and Treatments Labs (all labs ordered are listed, but only abnormal results are displayed) Labs Reviewed  CBC WITH DIFFERENTIAL/PLATELET - Abnormal; Notable for the following components:      Result Value   RBC 3.58 (*)    Hemoglobin 10.9 (*)    HCT 33.3 (*)    RDW 15.6 (*)     All other components within normal limits  BASIC METABOLIC PANEL WITH GFR                                                                                                                          Radiology CT ABDOMEN PELVIS WO CONTRAST Result Date: 04/16/2024 CLINICAL DATA:  Acute abdominal pain EXAM: CT ABDOMEN AND PELVIS WITHOUT CONTRAST TECHNIQUE: Multidetector CT imaging of the abdomen and pelvis was performed following the standard protocol without IV contrast. RADIATION DOSE REDUCTION: This exam was performed according to the departmental dose-optimization program which includes automated exposure control, adjustment of the mA and/or kV according to patient size and/or use of iterative reconstruction technique. COMPARISON:  CT abdomen and pelvis 05/22/2023. FINDINGS: Lower chest: There is a 3 mm left lower lobe nodule image 4/15. There is atelectasis or scarring in the lung bases. Hepatobiliary: No focal liver abnormality is seen. No gallstones, gallbladder wall thickening, or biliary dilatation. The Pancreas: Unremarkable. No pancreatic ductal dilatation or surrounding inflammatory changes. Spleen: Normal in size without focal abnormality. Adrenals/Urinary Tract: Adrenal glands are unremarkable. Kidneys are normal, without renal calculi, focal lesion, or hydronephrosis. Bladder is unremarkable. Stomach/Bowel: Stomach is within normal limits. Appendix appears normal. There is no evidence for bowel obstruction, pneumatosis or free air. There is a large amount of stool throughout the entire colon. The rectum is dilated with a large amount of stool measuring 7.5 cm in diameter. There is wall thickening with mild surrounding inflammation rectum. Vascular/Lymphatic: Aortic atherosclerosis. No enlarged abdominal or pelvic lymph nodes. Reproductive: Status post hysterectomy. No adnexal masses. Other: There is no ascites or free air. There is no focal abdominal wall hernia. Musculoskeletal: L4 compression  deformity is chronic and unchanged IMPRESSION: 1. Findings compatible with fecal impaction with stercoral colitis. 2. 3 mm left solid pulmonary nodule. No follow-up needed if patient is low-risk.This recommendation follows the consensus statement: Guidelines for Management of Incidental Pulmonary Nodules Detected on CT Images: From the Fleischner Society 2017; Radiology 2017; 284:228-243. Aortic Atherosclerosis (ICD10-I70.0). Electronically Signed   By: Greig Pique M.D.   On: 04/16/2024 21:06    Pertinent labs & imaging results that were available during my care of the patient were reviewed by me and considered in my medical decision making (see MDM for details).  Medications Ordered in ED Medications  sodium phosphate (FLEET) enema 1 enema (has no administration in time range)  docusate sodium  (COLACE) capsule 100 mg (has no administration in time range)  polyethylene glycol (MIRALAX  / GLYCOLAX ) packet 17 g (has no administration in time range)  midazolam  (VERSED ) injection 2 mg (2 mg Intravenous Given 04/16/24 2129)  fentaNYL  (SUBLIMAZE ) injection 50 mcg (50 mcg Intravenous Given 04/16/24 2129)  Procedures .Fecal disimpaction  Date/Time: 04/16/2024 10:39 PM  Performed by: Mannie Fairy DASEN, DO Authorized by: Mannie Fairy DASEN, DO  Consent: Verbal consent obtained Risks and benefits: risks, benefits and alternatives were discussed Consent given by: patient Patient identity confirmed: verbally with patient  Sedation: Patient sedated: yes Sedation type: anxiolysis Sedatives: fentanyl  and midazolam  Sedation start date/time: 04/16/2024 10:20 PM Sedation end date/time: 04/16/2024 10:40 PM  Patient tolerance: patient tolerated the procedure well with no immediate complications     (including critical care time)  Medical Decision Making / ED Course   This  patient presents to the ED for concern of constipation, this involves an extensive number of treatment options, and is a complaint that carries with it a high risk of complications and morbidity.  The differential diagnosis includes constipation, fecal impaction, bowel obstruction.  MDM: Formed CT Noncon on the patient's abdomen which did show a large fecal impaction with stercoral colitis.  Performed a fecal disimpaction I was able to remove a moderate amount of stool.  Will start the patient with Fleet enema, regular bowel regimens.  Basic blood work ordered on the patient, chronic anemia, normal renal function.  Will admit to hospitalist.   Additional history obtained: -Additional history obtained from EMS -External records from outside source obtained and reviewed including: Chart review including previous notes, labs, imaging, consultation notes   Lab Tests: -I ordered, reviewed, and interpreted labs.   The pertinent results include:   Labs Reviewed  CBC WITH DIFFERENTIAL/PLATELET - Abnormal; Notable for the following components:      Result Value   RBC 3.58 (*)    Hemoglobin 10.9 (*)    HCT 33.3 (*)    RDW 15.6 (*)    All other components within normal limits  BASIC METABOLIC PANEL WITH GFR       Imaging Studies ordered: I ordered imaging studies including CT abdomen pelvis I independently visualized and interpreted imaging. I agree with the radiologist interpretation   Medicines ordered and prescription drug management: Meds ordered this encounter  Medications   midazolam  (VERSED ) injection 2 mg   fentaNYL  (SUBLIMAZE ) injection 50 mcg   sodium phosphate (FLEET) enema 1 enema   docusate sodium  (COLACE) capsule 100 mg   polyethylene glycol (MIRALAX  / GLYCOLAX ) packet 17 g    -I have reviewed the patients home medicines and have made adjustments as needed    Cardiac Monitoring: The patient was maintained on a cardiac monitor.  I personally viewed and interpreted  the cardiac monitored which showed an underlying rhythm of: Normal sinus rhythm  Social Determinants of Health:  Factors impacting patients care include: Multiple medical comorbidities including Parkinson's disease   Reevaluation: After the interventions noted above, I reevaluated the patient and found that they have :improved  Co morbidities that complicate the patient evaluation  Past Medical History:  Diagnosis Date   Arthritis    hands, knees   Deafness    left ear sue to shingles   Hypertension    Kidney stones    Osteoporosis 2009   -2.6   Shingles         Final Clinical Impression(s) / ED Diagnoses Final diagnoses:  Stercoral colitis     @PCDICTATION @    Mannie Fairy T, DO 04/16/24 2240

## 2024-04-16 NOTE — ED Notes (Addendum)
 This RN chaperoned disimpaction by DO Mannie, patient tolerated well. Patient still very drowsy post medication, will attempt enema and PO meds when patient is more alert and can follow directions. OK per DO Mannie.

## 2024-04-16 NOTE — ED Triage Notes (Addendum)
 Patient BIB [facility] from [facility] c/o fecal impaction.  Patient states she's been like this for 2-3 weeks.  Patient has a history of chronic constipation.

## 2024-04-17 DIAGNOSIS — K5909 Other constipation: Secondary | ICD-10-CM

## 2024-04-17 DIAGNOSIS — K5289 Other specified noninfective gastroenteritis and colitis: Secondary | ICD-10-CM | POA: Diagnosis not present

## 2024-04-17 DIAGNOSIS — I10 Essential (primary) hypertension: Secondary | ICD-10-CM

## 2024-04-17 DIAGNOSIS — K5641 Fecal impaction: Secondary | ICD-10-CM

## 2024-04-17 DIAGNOSIS — G20A1 Parkinson's disease without dyskinesia, without mention of fluctuations: Secondary | ICD-10-CM

## 2024-04-17 DIAGNOSIS — F0280 Dementia in other diseases classified elsewhere without behavioral disturbance: Secondary | ICD-10-CM

## 2024-04-17 LAB — MRSA NEXT GEN BY PCR, NASAL: MRSA by PCR Next Gen: NOT DETECTED

## 2024-04-17 LAB — TSH: TSH: 1.179 u[IU]/mL (ref 0.350–4.500)

## 2024-04-17 MED ORDER — FOLIC ACID 1 MG PO TABS
1000.0000 ug | ORAL_TABLET | Freq: Every day | ORAL | Status: DC
  Administered 2024-04-17: 1 mg via ORAL
  Filled 2024-04-17: qty 1

## 2024-04-17 MED ORDER — ASPIRIN 81 MG PO TBEC
81.0000 mg | DELAYED_RELEASE_TABLET | Freq: Every day | ORAL | Status: DC
  Administered 2024-04-17: 81 mg via ORAL
  Filled 2024-04-17: qty 1

## 2024-04-17 MED ORDER — POLYETHYLENE GLYCOL 3350 17 G PO PACK: 17.0000 g | PACK | Freq: Two times a day (BID) | ORAL | 0 refills | Status: AC

## 2024-04-17 MED ORDER — ALBUTEROL SULFATE (2.5 MG/3ML) 0.083% IN NEBU: 2.5000 mg | INHALATION_SOLUTION | RESPIRATORY_TRACT | Status: DC | PRN

## 2024-04-17 MED ORDER — CARBIDOPA-LEVODOPA 25-100 MG PO TABS: 1.5000 | ORAL_TABLET | ORAL | Status: DC

## 2024-04-17 MED ORDER — METRONIDAZOLE 500 MG PO TABS: 500.0000 mg | ORAL_TABLET | Freq: Two times a day (BID) | ORAL | 0 refills | Status: AC

## 2024-04-17 MED ORDER — SENNA 8.6 MG PO TABS: 2.0000 | ORAL_TABLET | Freq: Every day | ORAL | 0 refills | Status: AC

## 2024-04-17 MED ORDER — LISINOPRIL 2.5 MG PO TABS
2.5000 mg | ORAL_TABLET | Freq: Every day | ORAL | Status: DC
  Administered 2024-04-17: 2.5 mg via ORAL
  Filled 2024-04-17: qty 1

## 2024-04-17 MED ORDER — LINACLOTIDE 145 MCG PO CAPS
145.0000 ug | ORAL_CAPSULE | Freq: Every day | ORAL | Status: DC
  Administered 2024-04-17: 145 ug via ORAL
  Filled 2024-04-17: qty 1

## 2024-04-17 MED ORDER — MIRTAZAPINE 15 MG PO TABS
7.5000 mg | ORAL_TABLET | Freq: Every day | ORAL | Status: DC
  Administered 2024-04-17: 7.5 mg via ORAL
  Filled 2024-04-17: qty 1

## 2024-04-17 MED ORDER — SENNA 8.6 MG PO TABS
2.0000 | ORAL_TABLET | Freq: Every day | ORAL | Status: DC
  Administered 2024-04-17: 17.2 mg via ORAL
  Filled 2024-04-17: qty 2

## 2024-04-17 MED ORDER — BISACODYL 10 MG RE SUPP: 10.0000 mg | Freq: Every day | RECTAL | Status: DC | PRN

## 2024-04-17 MED ORDER — CARBIDOPA-LEVODOPA 25-100 MG PO TABS
1.5000 | ORAL_TABLET | ORAL | Status: DC
  Administered 2024-04-17: 1.5 via ORAL
  Filled 2024-04-17: qty 2

## 2024-04-17 MED ORDER — LINACLOTIDE 145 MCG PO CAPS: 145.0000 ug | ORAL_CAPSULE | Freq: Every day | ORAL | 0 refills | Status: AC

## 2024-04-17 MED ORDER — ACETAMINOPHEN 500 MG PO TABS: 1000.0000 mg | ORAL_TABLET | Freq: Four times a day (QID) | ORAL | Status: DC | PRN

## 2024-04-17 MED ORDER — ONDANSETRON HCL 4 MG/2ML IJ SOLN: 4.0000 mg | Freq: Four times a day (QID) | INTRAMUSCULAR | Status: DC | PRN

## 2024-04-17 MED ORDER — THIAMINE MONONITRATE 100 MG PO TABS
200.0000 mg | ORAL_TABLET | Freq: Every day | ORAL | Status: DC
  Administered 2024-04-17: 200 mg via ORAL
  Filled 2024-04-17: qty 2

## 2024-04-17 MED ORDER — CARBIDOPA-LEVODOPA 25-100 MG PO TABS
2.0000 | ORAL_TABLET | Freq: Two times a day (BID) | ORAL | Status: DC
  Administered 2024-04-17 (×2): 2 via ORAL
  Filled 2024-04-17 (×2): qty 2

## 2024-04-17 MED ORDER — MELATONIN 3 MG PO TABS
6.0000 mg | ORAL_TABLET | Freq: Every evening | ORAL | Status: DC | PRN
  Administered 2024-04-17: 6 mg via ORAL
  Filled 2024-04-17: qty 2

## 2024-04-17 MED ORDER — POLYETHYLENE GLYCOL 3350 17 G PO PACK
17.0000 g | PACK | Freq: Two times a day (BID) | ORAL | Status: DC
  Administered 2024-04-17 (×2): 17 g via ORAL
  Filled 2024-04-17 (×2): qty 1

## 2024-04-17 NOTE — Discharge Summary (Signed)
 [facility] Phys[facility] Discharge Summary   Patient name: Hannah Fisher  Admit date:     04/16/2024  Discharge date: 04/17/2024  Attending Physician: SEGARS, JONATHAN [[id number]]  Discharge Physician: Camellia Door   PCP: Okey Carlin Redbird, MD  Admitted From: [facility] Disposition:  [facility]  Recommendations for Outpatient Follow-up:  Follow up with PCP in 1-2 weeks  Home Health:No Equipment/Devices: None    Discharge Condition:Stable CODE STATUS:FULL Diet recommendation: Heart Healthy Fluid Restriction: None  Hospital Summary: HPI: Hannah Fisher is a 73 y.o. female with hx of Parkinson's disease, dementia, recurrent falls, hypertension, malnutrition/nutritional deficiency, mood disorder, who presented from [facility] with concern for fecal impaction.  Per ED reported last bowel movement 2 weeks ago and patient was concerned she was having a fecal impaction so sought care.   On my interview she is disoriented and confused although awake does not recall why she came in the hospital.  No outward complaints  Significant Events: Admitted 04/16/2024 stercoral colitis, fecal impaction   Admission Labs: WBC 5.8, HgB 10.9, Plt 214 Na 139, K 3.6, CO2 of 24, Scr 0.67, glu 97 TSH 1.179  Admission Imaging Studies: CT abd/pelvis Findings compatible with fecal impaction with stercoral colitis. 2. 3 mm left solid pulmonary nodule. No follow-up needed if patient is low-risk.  Significant Labs:   Significant Imaging Studies:   Antibiotic Therapy: Anti-infectives (From admission, onward)    Start     Dose/Rate Route Frequency Ordered Stop   04/16/24 2300  cefTRIAXone  (ROCEPHIN ) 2 g in sodium chloride  0.9 % 100 mL IVPB        2 g 200 mL/hr over 30 Minutes Intravenous Every 24 hours 04/16/24 2249     04/16/24 2300  metroNIDAZOLE  (FLAGYL ) IVPB 500 mg        500 mg 100 mL/hr over 60 Minutes Intravenous Every 12 hours 04/16/24 2249          Procedures:   Consultants:    Hospital Course by Problem: * Stercoral colitis 04-17-2024 pt was manually disimpacted by EDP. Given enema. Will need to be on bowel program at ALF. Will give bid miralax , Linzess , senekot. DC to home with flagyl  500 mg bid x 7 days.  Fecal impaction (HCC) 04-17-2024 pt was manually disimpacted by EDP. Given enema. Will need to be on bowel program at ALF. Will give bid miralax , Linzess , senekot. DC to home with flagyl  500 mg bid x 7 days.  Chronic constipation 04-17-2024 pt was manually disimpacted by EDP. Given enema. Will need to be on bowel program at ALF. Will give bid miralax , Linzess , senekot. DC to home with flagyl  500 mg bid x 7 days.  Parkinson's disease dementia (HCC) 04-17-2024 on remeron  and sinemet .  Essential hypertension 04-17-2024 stable on lisinopril  2.5 mg daily.  Parkinson disease (HCC) 04-17-2024 stable and chronic. Continue sinemet .    Discharge Diagnoses:  Principal Problem:   Stercoral colitis Active Problems:   Chronic constipation   Fecal impaction (HCC)   Parkinson disease (HCC)   Essential hypertension   Parkinson's disease dementia [facility])   Discharge Instructions  Discharge Instructions     Call MD for:  difficulty breathing, headache or visual disturbances   Complete by: As directed    Call MD for:  extreme fatigue   Complete by: As directed    Call MD for:  hives   Complete by: As directed    Call MD for:  persistant dizziness or light-headedness   Complete by: As directed  Call MD for:  persistant nausea and vomiting   Complete by: As directed    Call MD for:  redness, tenderness, or signs of infection (pain, swelling, redness, odor or green/yellow discharge around incision site)   Complete by: As directed    Call MD for:  severe uncontrolled pain   Complete by: As directed    Call MD for:  temperature >100.4   Complete by: As directed    Diet general   Complete by: As directed     Discharge instructions   Complete by: As directed    1. Follow up with your primary care provider in 1-2 weeks following discharge from hospital.   Increase activity slowly   Complete by: As directed       Allergies as of 04/17/2024       Reactions   Prednisone Shortness Of Breath, Swelling, Other (See Comments)   Tightness in chest, trouble breathing, edema, and chest pressure **Not listed on the [facility]**   Penicillins Hives, Swelling, Other (See Comments)   Edema and itching, no hospitalization or airway required    Beta Adrenergic Blockers Nausea Only, Other (See Comments)   Made the patient feel badly **Not listed on the MAR**   Sulfa Antibiotics Rash        Medication List     TAKE these medications    acetaminophen  325 MG tablet Commonly known as: TYLENOL  Take 2 tablets (650 mg total) by mouth every 6 (six) hours as needed for mild pain (or Fever >/= 101). What changed:  how much to take when to take this   aspirin  EC 81 MG tablet Take 81 mg by mouth daily.   Calcium  600 600 MG Tabs tablet Generic drug: calcium  carbonate Take 600 mg by mouth daily.   carbidopa -levodopa  25-100 MG tablet Commonly known as: SINEMET  IR Take 1.5-2 tablets by mouth See admin instructions. Take 2 tablets by mouth at 8 AM & 12 NOON, then 1.5 tablets at 8 PM   cholecalciferol 25 MCG (1000 UNIT) tablet Commonly known as: VITAMIN D3 Take 1,000 Units by mouth daily.   docusate sodium  100 MG capsule Commonly known as: COLACE Take 100 mg by mouth 2 (two) times daily.   folic acid  800 MCG tablet Commonly known as: FOLVITE  Take 800 mcg by mouth daily.   linaclotide  145 MCG Caps capsule Commonly known as: LINZESS  Take 1 capsule (145 mcg total) by mouth daily before breakfast. Start taking on: April 18, 2024   lisinopril  2.5 MG tablet Commonly known as: ZESTRIL  Take 2.5 mg by mouth daily.   Magnesium  Glycinate 100 MG Caps Take 200 mg by mouth daily.   metroNIDAZOLE  500 MG  tablet Commonly known as: FLAGYL  Take 1 tablet (500 mg total) by mouth 2 (two) times daily for 7 days.   mirtazapine  7.5 MG tablet Commonly known as: REMERON  Take 7.5 mg by mouth at bedtime.   polyethylene glycol 17 g packet Commonly known as: MIRALAX  / GLYCOLAX  Take 17 g by mouth 2 (two) times daily.   senna 8.6 MG Tabs tablet Commonly known as: SENOKOT Take 2 tablets (17.2 mg total) by mouth at bedtime.   thiamine  100 MG tablet Commonly known as: VITAMIN B1 Take 200 mg by mouth daily.        Allergies  Allergen Reactions   Prednisone Shortness Of Breath, Swelling and Other (See Comments)    Tightness in chest, trouble breathing, edema, and chest pressure **Not listed on the MAR**   Penicillins Hives,  Swelling and Other (See Comments)    Edema and itching, no hospitalization or airway required    Beta Adrenergic Blockers Nausea Only and Other (See Comments)    Made the patient feel badly **Not listed on the [facility]**   Sulfa Antibiotics Rash    Discharge Exam: Vitals:   04/17/24 1102 04/17/24 1217  BP: 122/77 133/83  Pulse: 84 86  Resp:  18  Temp:  98.4 F (36.9 C)  SpO2:  100%    Physical Exam Vitals and nursing note reviewed.  Constitutional:      General: She is not in acute distress.    Appearance: She is normal weight. She is not toxic-appearing or diaphoretic.     Comments: Appears chronically ill  HENT:     Head: Normocephalic and atraumatic.  Eyes:     General: No scleral icterus. Pulmonary:     Effort: Pulmonary effort is normal.  Abdominal:     General: Abdomen is flat. Bowel sounds are normal. There is no distension.  Musculoskeletal:     Right lower leg: No edema.  Skin:    General: Skin is warm and dry.     Capillary Refill: Capillary refill takes less than 2 seconds.  Neurological:     Mental Status: She is disoriented.     The results of significant diagnostics from this hospitalization (including imaging, microbiology, ancillary and  laboratory) are listed below for reference.    Microbiology: Recent Results (from the past 240 hours)  MRSA Next Gen by PCR, Nasal     Status: None   Collection Time: 04/17/24  3:55 AM   Specimen: Nasal Mucosa; Nasal Swab  Result Value Ref Range Status   MRSA by PCR Next Gen NOT DETECTED NOT DETECTED Final    Comment: (NOTE) The GeneXpert MRSA Assay (FDA approved for NASAL specimens only), is one component of a comprehensive MRSA colonization surveillance program. It is not intended to diagnose MRSA infection nor to guide or monitor treatment for MRSA infections. Test performance is not FDA approved in patients less than 1 years old. Performed at [facility], [address] [place], [place] [address]      Labs:  Basic Metabolic Panel: Recent Labs  Lab 04/16/24 2123  NA 139  K 3.6  CL 107  CO2 24  GLUCOSE 97  BUN 17  CREATININE 0.67  CALCIUM  9.5   CBC: Recent Labs  Lab 04/16/24 2123  WBC 5.8  NEUTROABS 4.1  HGB 10.9*  HCT 33.3*  MCV 93.0  PLT 214   Thyroid  function studies Recent Labs    04/16/24 2123  TSH 1.179   Sepsis Labs Recent Labs  Lab 04/16/24 2123  WBC 5.8    Procedures/Studies: CT ABDOMEN PELVIS WO CONTRAST Result Date: 04/16/2024 CLINICAL DATA:  Acute abdominal pain EXAM: CT ABDOMEN AND PELVIS WITHOUT CONTRAST TECHNIQUE: Multidetector CT imaging of the abdomen and pelvis was performed following the standard protocol without IV contrast. RADIATION DOSE REDUCTION: This exam was performed according to the departmental dose-optimization program which includes automated exposure control, adjustment of the mA and/or kV according to patient size and/or use of iterative reconstruction technique. COMPARISON:  CT abdomen and pelvis 05/22/2023. FINDINGS: Lower chest: There is a 3 mm left lower lobe nodule image 4/15. There is atelectasis or scarring in the lung bases. Hepatobiliary: No focal liver abnormality is seen. No gallstones, gallbladder  wall thickening, or biliary dilatation. The Pancreas: Unremarkable. No pancreatic ductal dilatation or surrounding inflammatory changes. Spleen:  Normal in size without focal abnormality. Adrenals/Urinary Tract: Adrenal glands are unremarkable. Kidneys are normal, without renal calculi, focal lesion, or hydronephrosis. Bladder is unremarkable. Stomach/Bowel: Stomach is within normal limits. Appendix appears normal. There is no evidence for bowel obstruction, pneumatosis or free air. There is a large amount of stool throughout the entire colon. The rectum is dilated with a large amount of stool measuring 7.5 cm in diameter. There is wall thickening with mild surrounding inflammation rectum. Vascular/Lymphatic: Aortic atherosclerosis. No enlarged abdominal or pelvic lymph nodes. Reproductive: Status post hysterectomy. No adnexal masses. Other: There is no ascites or free air. There is no focal abdominal wall hernia. Musculoskeletal: L4 compression deformity is chronic and unchanged IMPRESSION: 1. Findings compatible with fecal impaction with stercoral colitis. 2. 3 mm left solid pulmonary nodule. No follow-up needed if patient is low-risk.This recommendation follows the consensus statement: Guidelines for Management of Incidental Pulmonary Nodules Detected on CT Images: From the Fleischner Society 2017; Radiology 2017; 284:228-243. Aortic Atherosclerosis (ICD10-I70.0). Electronically Signed   By: Greig Pique M.D.   On: 04/16/2024 21:06    Time coordinating discharge: 55 mins  SIGNED:  Camellia Door, DO [facility] 04/17/24, 2:16 PM

## 2024-04-17 NOTE — Progress Notes (Signed)
 Mobility Specialist: Progress Note   04/17/24 1631  Mobility  Activity Ambulated with assistance in room  Level of Assistance Moderate assist, patient does 50-74%  Assistive Device Front wheel walker  Distance Ambulated (ft) 15 ft  Activity Response Tolerated fair  Mobility visit 1 Mobility  Mobility Specialist Start Time (ACUTE ONLY) 1620  Mobility Specialist Stop Time (ACUTE ONLY) 1630  Mobility Specialist Time Calculation (min) (ACUTE ONLY) 10 min    Pt received in chair, confused with chair alarm going off. Requested to walk around to room some. Distance limited d/t weakness, pt ambulating with flexed knees and losing balance. Pulled chair behind her so she could sit, then pt was rolled back to bedside in the chair. ModA for transfer from chair to bed with HHA. Left in supine with all needs met, call bell in reach. Bed alarm on.   Ileana Lute Mobility Specialist Please contact via SecureChat or Rehab office at [phone]

## 2024-04-17 NOTE — TOC Transition Note (Signed)
 Transition of Care [facility]) - Discharge Note   Patient Details  Name: Hannah Fisher MRN: [id number] Date of Birth: Jun 19, 1951  Transition of Care [facility]) CM/SW Contact:  [name], LCSW Phone Number: 04/17/2024, 4:07 PM   Clinical Narrative:   CSW spoke with daughter, Hannah Fisher, to confirm patient is from [facility] and plan is to return. CSW updated MD, patient stable for discharge today. CSW sent discharge information, confirmed with Renda that information was received and patient can return. No need for nurse to call report. Transport arranged with [facility] for next available.    Final next level of care: Skilled Nursing Facility Barriers to Discharge: Barriers Resolved   Patient Goals and CMS Choice            Discharge Placement              Patient chooses bed at:  [facility]) Patient to be transferred to facility by: [facility] Name of family member notified: Hannah Fisher Patient and family notified of of transfer: 04/17/24  Discharge Plan and Services Additional resources added to the After Visit Summary for                                       Social Drivers of Health (SDOH) Interventions SDOH Screenings   Food Insecurity: No Food Insecurity (05/23/2023)  Housing: Low Risk  (05/23/2023)  Transportation Needs: No Transportation Needs (05/23/2023)  Utilities: Not At Risk (05/23/2023)  Tobacco Use: Low Risk  (04/16/2024)     Readmission Risk Interventions     No data to display

## 2024-04-17 NOTE — Assessment & Plan Note (Signed)
 04-17-2024 on remeron  and sinemet .

## 2024-04-17 NOTE — NC FL2 (Signed)
 [place]  MEDICAID FL2 LEVEL OF CARE FORM     IDENTIFICATION  Patient Name: Hannah Fisher Birthdate: 1951-02-01 Sex: female Admission Date (Current Location): 04/16/2024  [facility] and [place] Number:  Producer, television/film/video and Address:  [facility], [address] [place], [place] [address]      Provider Number: [id number]  Attending Physician Name and Address:  Laurence Locus, DO  Relative Name and Phone Number:       Current Level of Care: Hospital Recommended Level of Care: Assisted Living Facility Prior Approval Number:    Date Approved/Denied:   PASRR Number:    Discharge Plan: Other (Comment) (ALF)    Current Diagnoses: Patient Active Problem List   Diagnosis Date Noted   Fecal impaction (HCC) 04/17/2024   Parkinson's disease dementia (HCC) 04/17/2024   Stercoral colitis 04/16/2024   Normocytic anemia 05/27/2023   Folate deficiency 05/27/2023   Essential hypertension 02/19/2023   Weakness 02/19/2023   Impairment of balance 08/23/2021   Parkinson disease (HCC) 01/28/2021   Abnormal gait 01/24/2021   Allergic rhinitis 01/24/2021   Allergic rhinitis due to animal (cat) (dog) hair and dander 01/24/2021   Anxiety 01/24/2021   Chronic constipation 01/24/2021   Difficulty sleeping 01/24/2021   Gastritis 01/24/2021   High cholesterol 01/24/2021   Protein calorie malnutrition (HCC) 01/24/2021   Recurrent falls 01/24/2021   Restless legs 01/24/2021   Sciatica 01/24/2021   Standard chest x-ray abnormal 01/24/2021   Vitamin D deficiency 01/24/2021   Lightheadedness 06/22/2020   Other abnormalities of gait and mobility 05/03/2020   Lumbar radiculopathy 04/28/2020   Dysphonia 12/03/2019   Age-related osteoporosis without current pathological fracture 11/25/2019   Dysfunction of both eustachian tubes 05/19/2019   Presbycusis of both ears 05/19/2019   Unilateral deafness, left 04/07/2019   Deafness    Osteoporosis     Orientation  RESPIRATION BLADDER Height & Weight     Self, Place  Normal Continent Weight: 80 lb 7.5 oz (36.5 kg) Height:  5' 2 (157.5 cm)  BEHAVIORAL SYMPTOMS/MOOD NEUROLOGICAL BOWEL NUTRITION STATUS      Continent Diet (Regular)  AMBULATORY STATUS COMMUNICATION OF NEEDS Skin   Limited Assist Verbally Normal                       Personal Care Assistance Level of Assistance  Bathing, Feeding, Dressing Bathing Assistance: Limited assistance Feeding assistance: Limited assistance Dressing Assistance: Limited assistance     Functional Limitations Info  Speech     Speech Info: Impaired (dysarthria)    SPECIAL CARE FACTORS FREQUENCY  PT (By licensed PT)     PT Frequency: eval and treat              Contractures Contractures Info: Not present    Additional Factors Info  Code Status, Allergies, Psychotropic Code Status Info: Full Allergies Info: Prednisone, Penicillins, Beta Adrenergic Blockers, Sulfa Antibiotics Psychotropic Info: Remeron  7.5mg  daily at bed           Discharge Medications: TAKE these medications     acetaminophen  325 MG tablet Commonly known as: TYLENOL  Take 2 tablets (650 mg total) by mouth every 6 (six) hours as needed for mild pain (or Fever >/= 101). What changed:  how much to take when to take this    aspirin  EC 81 MG tablet Take 81 mg by mouth daily.    Calcium  600 600 MG Tabs tablet Generic drug: calcium  carbonate Take 600 mg by  mouth daily.    carbidopa -levodopa  25-100 MG tablet Commonly known as: SINEMET  IR Take 1.5-2 tablets by mouth See admin instructions. Take 2 tablets by mouth at 8 AM & 12 NOON, then 1.5 tablets at 8 PM    cholecalciferol 25 MCG (1000 UNIT) tablet Commonly known as: VITAMIN D3 Take 1,000 Units by mouth daily.    docusate sodium  100 MG capsule Commonly known as: COLACE Take 100 mg by mouth 2 (two) times daily.    folic acid  800 MCG tablet Commonly known as: FOLVITE  Take 800 mcg by mouth daily.     linaclotide  145 MCG Caps capsule Commonly known as: LINZESS  Take 1 capsule (145 mcg total) by mouth daily before breakfast. Start taking on: April 18, 2024    lisinopril  2.5 MG tablet Commonly known as: ZESTRIL  Take 2.5 mg by mouth daily.    Magnesium  Glycinate 100 MG Caps Take 200 mg by mouth daily.    metroNIDAZOLE  500 MG tablet Commonly known as: FLAGYL  Take 1 tablet (500 mg total) by mouth 2 (two) times daily for 7 days.    mirtazapine  7.5 MG tablet Commonly known as: REMERON  Take 7.5 mg by mouth at bedtime.    polyethylene glycol 17 g packet Commonly known as: MIRALAX  / GLYCOLAX  Take 17 g by mouth 2 (two) times daily.    senna 8.6 MG Tabs tablet Commonly known as: SENOKOT Take 2 tablets (17.2 mg total) by mouth at bedtime.    thiamine  100 MG tablet Commonly known as: VITAMIN B1 Take 200 mg by mouth daily.    Relevant Imaging Results:  Relevant Lab Results:   Additional Information SS#: [id number]  [name], LCSW

## 2024-04-17 NOTE — Assessment & Plan Note (Signed)
 04-17-2024 stable and chronic. Continue sinemet .

## 2024-04-17 NOTE — Progress Notes (Signed)
 [id number] - attempting to call emergency contact Therisa Lance at [phone] to inform her of patient being admitted to 2 west room 17 however call went directly to voicemail.

## 2024-04-17 NOTE — Subjective & Objective (Signed)
 Pt seen and examined. Pt manually disimpacted by EDP. No leukocytosis. Ready to be discharged back to ALF.

## 2024-04-17 NOTE — Hospital Course (Signed)
 HPI: Hannah Fisher is a 73 y.o. female with hx of Parkinson's disease, dementia, recurrent falls, hypertension, malnutrition/nutritional deficiency, mood disorder, who presented from [facility] with concern for fecal impaction.  Per ED reported last bowel movement 2 weeks ago and patient was concerned she was having a fecal impaction so sought care.   On my interview she is disoriented and confused although awake does not recall why she came in the hospital.  No outward complaints  Significant Events: Admitted 04/16/2024 stercoral colitis, fecal impaction   Admission Labs: WBC 5.8, HgB 10.9, Plt 214 Na 139, K 3.6, CO2 of 24, Scr 0.67, glu 97 TSH 1.179  Admission Imaging Studies: CT abd/pelvis Findings compatible with fecal impaction with stercoral colitis. 2. 3 mm left solid pulmonary nodule. No follow-up needed if patient is low-risk.  Significant Labs:   Significant Imaging Studies:   Antibiotic Therapy: Anti-infectives (From admission, onward)    Start     Dose/Rate Route Frequency Ordered Stop   04/16/24 2300  cefTRIAXone  (ROCEPHIN ) 2 g in sodium chloride  0.9 % 100 mL IVPB        2 g 200 mL/hr over 30 Minutes Intravenous Every 24 hours 04/16/24 2249     04/16/24 2300  metroNIDAZOLE  (FLAGYL ) IVPB 500 mg        500 mg 100 mL/hr over 60 Minutes Intravenous Every 12 hours 04/16/24 2249         Procedures:   Consultants:

## 2024-04-17 NOTE — H&P (Signed)
 History and Physical    Hannah Fisher FMW:[id number] DOB: 09/04/1951 DOA: 04/16/2024  PCP: Okey Carlin Redbird, MD   Patient coming from: LTC   Chief Complaint:  Chief Complaint  Patient presents with   Fecal Impaction    HPI: History limited due to dementia  Hannah Fisher is a 73 y.o. female with hx of Parkinson's disease, dementia, recurrent falls, hypertension, malnutrition/nutritional deficiency, mood disorder, who presented from [facility] with concern for fecal impaction.  Per ED reported last bowel movement 2 weeks ago and patient was concerned she was having a fecal impaction so sought care.  On my interview she is disoriented and confused although awake does not recall why she came in the hospital.  No outward complaints   Review of Systems:  ROS limited due to dementia  Allergies  Allergen Reactions   Prednisone Shortness Of Breath, Swelling and Other (See Comments)    Tightness in chest, trouble breathing, edema, and chest pressure **Not listed on the MAR**   Penicillins Hives, Swelling and Other (See Comments)    Edema and itching, no hospitalization or airway required    Beta Adrenergic Blockers Nausea Only and Other (See Comments)    Made the patient feel badly **Not listed on the MAR**   Sulfa Antibiotics Rash    Prior to Admission medications   Medication Sig Start Date End Date Taking? Authorizing Provider  acetaminophen  (TYLENOL ) 325 MG tablet Take 2 tablets (650 mg total) by mouth every 6 (six) hours as needed for mild pain (or Fever >/= 101). Patient taking differently: Take 325 mg by mouth in the morning and at bedtime. 05/31/23  Yes Danton Reyes DASEN, MD  aspirin  81 MG EC tablet Take 81 mg by mouth daily.   Yes [provider]  calcium  carbonate (CALCIUM  600) 600 MG TABS tablet Take 600 mg by mouth daily.   Yes [provider]  carbidopa -levodopa  (SINEMET  IR) 25-100 MG tablet Take 1.5-2 tablets by mouth See admin instructions.  Take 2 tablets by mouth at 8 AM & 12 NOON, then 1.5 tablets at 8 PM 09/18/22  Yes [provider]  cholecalciferol (VITAMIN D3) 25 MCG (1000 UNIT) tablet Take 1,000 Units by mouth daily.   Yes [provider]  docusate sodium  (COLACE) 100 MG capsule Take 100 mg by mouth 2 (two) times daily.   Yes [provider]  folic acid  (FOLVITE ) 800 MCG tablet Take 800 mcg by mouth daily.   Yes [provider]  lisinopril  (ZESTRIL ) 2.5 MG tablet Take 2.5 mg by mouth daily.   Yes [provider]  Magnesium  Glycinate 100 MG CAPS Take 200 mg by mouth daily.   Yes [provider]  mirtazapine  (REMERON ) 7.5 MG tablet Take 7.5 mg by mouth at bedtime.   Yes [provider]  thiamine  (VITAMIN B1) 100 MG tablet Take 200 mg by mouth daily.   Yes [provider]    Past Medical History:  Diagnosis Date   Arthritis    hands, knees   Deafness    left ear sue to shingles   Hypertension    Kidney stones    Osteoporosis 2009   -2.6   Shingles     Past Surgical History:  Procedure Laterality Date   MASS EXCISION Right 05/30/2018   Procedure: EXCISION MASS RIGHT SMALL FINGER;  Surgeon: Murrell Kuba, MD;  Location: [facility];  Service: Orthopedics;  Laterality: Right;   TONSILLECTOMY     VAGINAL  HYSTERECTOMY  1992     reports that she has never smoked. She has never used smokeless tobacco. She reports that she does not currently use alcohol. She reports that she does not use drugs.  Family History  Problem Relation Age of Onset   Hypertension Mother    Stroke Mother    Hypertension Sister    Hypertension Brother    Breast cancer Neg Hx      Physical Exam: Vitals:   04/16/24 2300 04/16/24 2315 04/16/24 2330 04/17/24 0029  BP: 110/74 (!) 144/90 (!) 141/76 (!) 141/81  Pulse: (!) 54 (!) 57 (!) 55 (!) 59  Resp: 16 13 12 14   Temp:    97.9 F (36.6 C)  SpO2: 100% 100% 100% 100%  Weight:        Gen: Awake, alert,  chronically ill-appearing, malnourished CV: Regular, normal S1, S2, no murmurs  Resp: Normal WOB, CTAB  Abd: Flat, normoactive, hyperresonant, nontender MSK: Sarcopenia, symmetric, trace edema  Skin: No rashes or lesions to exposed skin  Neuro: Alert and interactive, oriented to self only Psych: euthymic, appropriate    Data review:   Labs reviewed, notable for:   Chemistries and blood counts unremarkable WBC 5  Micro:  Results for orders placed or performed during the hospital encounter of 05/22/23  Blood Culture (routine x 2)     Status: Abnormal   Collection Time: 05/22/23  5:34 PM   Specimen: BLOOD  Result Value Ref Range Status   Specimen Description BLOOD RIGHT ANTECUBITAL  Final   Special Requests   Final    BOTTLES DRAWN AEROBIC AND ANAEROBIC Blood Culture adequate volume   Culture  Setup Time   Final    GRAM NEGATIVE RODS IN BOTH AEROBIC AND ANAEROBIC BOTTLES CRITICAL RESULT CALLED TO, READ BACK BY AND VERIFIED WITHBETHA CANDIE DEWEY MAYA, AT 9185 05/23/23 CHARM CARMIN Performed at [facility], [address], [place], [place] [address]    Culture ESCHERICHIA COLI (A)  Final   Report Status 05/25/2023 FINAL  Final   Organism ID, Bacteria ESCHERICHIA COLI  Final      Susceptibility   Escherichia coli - MIC*    AMPICILLIN <=2 SENSITIVE Sensitive     CEFEPIME  <=0.12 SENSITIVE Sensitive     CEFTAZIDIME <=1 SENSITIVE Sensitive     CEFTRIAXONE  <=0.25 SENSITIVE Sensitive     CIPROFLOXACIN <=0.25 SENSITIVE Sensitive     GENTAMICIN <=1 SENSITIVE Sensitive     IMIPENEM <=0.25 SENSITIVE Sensitive     TRIMETH/SULFA <=20 SENSITIVE Sensitive     AMPICILLIN/SULBACTAM <=2 SENSITIVE Sensitive     PIP/TAZO <=4 SENSITIVE Sensitive     * ESCHERICHIA COLI  Blood Culture ID Panel (Reflexed)     Status: Abnormal   Collection Time: 05/22/23  5:34 PM  Result Value Ref Range Status   Enterococcus faecalis NOT DETECTED NOT DETECTED Final   Enterococcus Faecium NOT DETECTED NOT  DETECTED Final   Listeria monocytogenes NOT DETECTED NOT DETECTED Final   Staphylococcus species NOT DETECTED NOT DETECTED Final   Staphylococcus aureus (BCID) NOT DETECTED NOT DETECTED Final   Staphylococcus epidermidis NOT DETECTED NOT DETECTED Final   Staphylococcus lugdunensis NOT DETECTED NOT DETECTED Final   Streptococcus species NOT DETECTED NOT DETECTED Final   Streptococcus agalactiae NOT DETECTED NOT DETECTED Final   Streptococcus pneumoniae NOT DETECTED NOT DETECTED Final   Streptococcus pyogenes NOT DETECTED NOT DETECTED Final   A.calcoaceticus-baumannii NOT DETECTED NOT DETECTED Final   Bacteroides fragilis NOT DETECTED NOT  DETECTED Final   Enterobacterales DETECTED (A) NOT DETECTED Final    Comment: Enterobacterales represent a large order of gram negative bacteria, not a single organism. CRITICAL RESULT CALLED TO, READ BACK BY AND VERIFIED WITH: S. MOODY PHARMD, AT 9185 05/23/23 D. VANHOOK    Enterobacter cloacae complex NOT DETECTED NOT DETECTED Final   Escherichia coli DETECTED (A) NOT DETECTED Final    Comment: CRITICAL RESULT CALLED TO, READ BACK BY AND VERIFIED WITH: S. MOODY PHARMD, AT 9185 05/23/23 D. VANHOOK    Klebsiella aerogenes NOT DETECTED NOT DETECTED Final   Klebsiella oxytoca NOT DETECTED NOT DETECTED Final   Klebsiella pneumoniae NOT DETECTED NOT DETECTED Final   Proteus species NOT DETECTED NOT DETECTED Final   Salmonella species NOT DETECTED NOT DETECTED Final   Serratia marcescens NOT DETECTED NOT DETECTED Final   Haemophilus influenzae NOT DETECTED NOT DETECTED Final   Neisseria meningitidis NOT DETECTED NOT DETECTED Final   Pseudomonas aeruginosa NOT DETECTED NOT DETECTED Final   Stenotrophomonas maltophilia NOT DETECTED NOT DETECTED Final   Candida albicans NOT DETECTED NOT DETECTED Final   Candida auris NOT DETECTED NOT DETECTED Final   Candida glabrata NOT DETECTED NOT DETECTED Final   Candida krusei NOT DETECTED NOT DETECTED Final   Candida  parapsilosis NOT DETECTED NOT DETECTED Final   Candida tropicalis NOT DETECTED NOT DETECTED Final   Cryptococcus neoformans/gattii NOT DETECTED NOT DETECTED Final   CTX-M ESBL NOT DETECTED NOT DETECTED Final   Carbapenem resistance IMP NOT DETECTED NOT DETECTED Final   Carbapenem resistance KPC NOT DETECTED NOT DETECTED Final   Carbapenem resistance NDM NOT DETECTED NOT DETECTED Final   Carbapenem resist OXA 48 LIKE NOT DETECTED NOT DETECTED Final   Carbapenem resistance VIM NOT DETECTED NOT DETECTED Final    Comment: Performed at [facility], [address], [place], [place] [address]  Resp panel by RT-PCR (RSV, Flu A&B, Covid) Anterior Nasal Swab     Status: None   Collection Time: 05/22/23  6:54 PM   Specimen: Anterior Nasal Swab  Result Value Ref Range Status   SARS Coronavirus 2 by RT PCR NEGATIVE NEGATIVE Final   Influenza A by PCR NEGATIVE NEGATIVE Final   Influenza B by PCR NEGATIVE NEGATIVE Final    Comment: (NOTE) The Xpert Xpress SARS-CoV-2/FLU/RSV plus assay is intended as an aid in the diagnosis of influenza from Nasopharyngeal swab specimens and should not be used as a sole basis for treatment. Nasal washings and aspirates are unacceptable for Xpert Xpress SARS-CoV-2/FLU/RSV testing.  Fact Sheet for Patients: BloggerCourse.com  Fact Sheet for Healthcare Providers: SeriousBroker.it  This test is not yet approved or cleared by the [place]  FDA and has been authorized for detection and/or diagnosis of SARS-CoV-2 by FDA under an Emergency Use Authorization (EUA). This EUA will remain in effect (meaning this test can be used) for the duration of the COVID-19 declaration under Section 564(b)(1) of the Act, 21 U.S.C. section 360bbb-3(b)(1), unless the authorization is terminated or revoked.     Resp Syncytial Virus by PCR NEGATIVE NEGATIVE Final    Comment: (NOTE) Fact Sheet for  Patients: BloggerCourse.com  Fact Sheet for Healthcare Providers: SeriousBroker.it  This test is not yet approved or cleared by the [place]  FDA and has been authorized for detection and/or diagnosis of SARS-CoV-2 by FDA under an Emergency Use Authorization (EUA). This EUA will remain in effect (meaning this test can be used) for the duration of the COVID-19 declaration under Section 564(b)(1) of  the Act, 21 U.S.C. section 360bbb-3(b)(1), unless the authorization is terminated or revoked.  Performed at [facility], [address], [place], [place] [address]   Blood Culture (routine x 2)     Status: None   Collection Time: 05/22/23  8:34 PM   Specimen: BLOOD  Result Value Ref Range Status   Specimen Description BLOOD RIGHT ANTECUBITAL  Final   Special Requests   Final    BOTTLES DRAWN AEROBIC AND ANAEROBIC Blood Culture adequate volume   Culture   Final    NO GROWTH 5 DAYS Performed at [facility], [address], [place], [place] [address]    Report Status 05/27/2023 FINAL  Final  Culture, blood (x 2)     Status: None   Collection Time: 05/23/23  3:06 AM   Specimen: BLOOD RIGHT ARM  Result Value Ref Range Status   Specimen Description BLOOD RIGHT ARM  Final   Special Requests   Final    BOTTLES DRAWN AEROBIC AND ANAEROBIC Blood Culture results may not be optimal due to an excessive volume of blood received in culture bottles   Culture   Final    NO GROWTH 5 DAYS Performed at [facility], [address], [place], [place] [address]    Report Status 05/28/2023 FINAL  Final  Culture, blood (x 2)     Status: None   Collection Time: 05/23/23  3:06 AM   Specimen: BLOOD RIGHT HAND  Result Value Ref Range Status   Specimen Description BLOOD RIGHT HAND  Final   Special Requests   Final    BOTTLES DRAWN AEROBIC AND ANAEROBIC Blood Culture adequate volume   Culture   Final    NO GROWTH 5 DAYS Performed at  [facility], [address] [place], [place] [address]    Report Status 05/28/2023 FINAL  Final  Urine Culture     Status: None   Collection Time: 05/23/23  5:13 AM   Specimen: Urine, Random  Result Value Ref Range Status   Specimen Description URINE, RANDOM  Final   Special Requests NONE Reflexed from [id number]  Final   Culture   Final    NO GROWTH Performed at [facility], [address], [place], [place] [address]    Report Status 05/25/2023 FINAL  Final    Imaging reviewed:  CT ABDOMEN PELVIS WO CONTRAST Result Date: 04/16/2024 CLINICAL DATA:  Acute abdominal pain EXAM: CT ABDOMEN AND PELVIS WITHOUT CONTRAST TECHNIQUE: Multidetector CT imaging of the abdomen and pelvis was performed following the standard protocol without IV contrast. RADIATION DOSE REDUCTION: This exam was performed according to the departmental dose-optimization program which includes automated exposure control, adjustment of the mA and/or kV according to patient size and/or use of iterative reconstruction technique. COMPARISON:  CT abdomen and pelvis 05/22/2023. FINDINGS: Lower chest: There is a 3 mm left lower lobe nodule image 4/15. There is atelectasis or scarring in the lung bases. Hepatobiliary: No focal liver abnormality is seen. No gallstones, gallbladder wall thickening, or biliary dilatation. The Pancreas: Unremarkable. No pancreatic ductal dilatation or surrounding inflammatory changes. Spleen: Normal in size without focal abnormality. Adrenals/Urinary Tract: Adrenal glands are unremarkable. Kidneys are normal, without renal calculi, focal lesion, or hydronephrosis. Bladder is unremarkable. Stomach/Bowel: Stomach is within normal limits. Appendix appears normal. There is no evidence for bowel obstruction, pneumatosis or free air. There is a large amount of stool throughout the entire colon. The rectum is dilated with  a large amount of stool measuring 7.5 cm in diameter. There is wall thickening with mild  surrounding inflammation rectum. Vascular/Lymphatic: Aortic atherosclerosis. No enlarged abdominal or pelvic lymph nodes. Reproductive: Status post hysterectomy. No adnexal masses. Other: There is no ascites or free air. There is no focal abdominal wall hernia. Musculoskeletal: L4 compression deformity is chronic and unchanged IMPRESSION: 1. Findings compatible with fecal impaction with stercoral colitis. 2. 3 mm left solid pulmonary nodule. No follow-up needed if patient is low-risk.This recommendation follows the consensus statement: Guidelines for Management of Incidental Pulmonary Nodules Detected on CT Images: From the Fleischner Society 2017; Radiology 2017; 284:228-243. Aortic Atherosclerosis (ICD10-I70.0). Electronically Signed   By: Greig Pique M.D.   On: 04/16/2024 21:06    ED Course:  Disimpacted in the ED, treated with Versed  and fentanyl  for this    Assessment/Plan:  73 y.o. female with hx Parkinson's disease, dementia, recurrent falls, hypertension, malnutrition/nutritional deficiency, mood disorder, who presented from [facility] with concern for fecal impaction.  Found to have fecal impaction associated with stercoral colitis  Fecal impaction Stercoral colitis - Disimpacted in the ED - Started on ceftriaxone  2 g IV every 24 hours, Flagyl  500 mg IV twice daily - Bowel regimen MiraLAX  twice daily, senna nightly, bisacodyl  suppository as needed.   - As long as passing bowel movements and tolerating p.o. anticipate she should be able to discharge later today.  Chronic medical problems: Parkinson disease: Continue home Sinemet  Dementia: Disoriented at time of my evaluation, possibly medication related versus infection. History of recurrent falls: Noted  Hypertension: Continue home lisinopril  Mild protein calorie malnutrition: Noted History of B12/folate deficiency: Continue home B12, folate, thiamine . Mood disorder: Continue home mirtazapine  ? Primary ppx ascvd: on aspirin   daily, unclear indication. May be able to stop if for primary ppx.   Body mass index is 18.74 kg/m.    DVT prophylaxis:  SCDs Code Status:  Full Code Diet:  Diet Orders (From admission, onward)     Start     Ordered   04/17/24 0211  Diet regular Room service appropriate? Yes; Fluid consistency: Thin  Diet effective now       Question Answer Comment  Room service appropriate? Yes   Fluid consistency: Thin      04/17/24 0211           Family Communication:  None   Consults:  None   Admission status:   Observation, Med-Surg  Severity of Illness: The appropriate patient status for this patient is OBSERVATION. Observation status is judged to be reasonable and necessary in order to provide the required intensity of service to ensure the patient's safety. The patient's presenting symptoms, physical exam findings, and initial radiographic and laboratory data in the context of their medical condition is felt to place them at decreased risk for further clinical deterioration. Furthermore, it is anticipated that the patient will be medically stable for discharge from the hospital within 2 midnights of admission.    Dorn Dawson, MD [facility]  How to contact the [facility] Attending or Consulting provider 7A - 7P or covering provider during after hours 7P -7A, for this patient.  Check the care team in [facility] and look for a) attending/consulting [facility] provider listed and b) the [facility] team listed Log into [email or web address] and use [place]'s universal password to access. If you do not have the password, please contact the hospital operator. Locate the [facility] provider you are looking for under [facility] and page to a number that  you can be directly reached. If you still have difficulty reaching the provider, please page the [facility] (Director on Call) for the Hospitalists listed on amion for assistance.  04/17/2024, 2:20 AM

## 2024-04-17 NOTE — Assessment & Plan Note (Signed)
 04-17-2024 pt was manually disimpacted by EDP. Given enema. Will need to be on bowel program at ALF. Will give bid miralax , Linzess , senekot. DC to home with flagyl  500 mg bid x 7 days.

## 2024-04-17 NOTE — Progress Notes (Signed)
 PROGRESS NOTE    Hannah Fisher  FMW:[id number] DOB: 1951/03/25 DOA: 04/16/2024 PCP: Okey Carlin Redbird, MD  Subjective: Hannah Fisher seen and examined. Hannah Fisher manually disimpacted by EDP. No leukocytosis. Ready to be discharged back to ALF.   Hospital Course: HPI: Hannah Fisher is a 73 y.o. female with hx of Parkinson's disease, dementia, recurrent falls, hypertension, malnutrition/nutritional deficiency, mood disorder, who presented from [facility] with concern for fecal impaction.  Per ED reported last bowel movement 2 weeks ago and patient was concerned she was having a fecal impaction so sought care.   On my interview she is disoriented and confused although awake does not recall why she came in the hospital.  No outward complaints  Significant Events: Admitted 04/16/2024 stercoral colitis, fecal impaction   Admission Labs: WBC 5.8, HgB 10.9, Plt 214 Na 139, K 3.6, CO2 of 24, Scr 0.67, glu 97 TSH 1.179  Admission Imaging Studies: CT abd/pelvis Findings compatible with fecal impaction with stercoral colitis. 2. 3 mm left solid pulmonary nodule. No follow-up needed if patient is low-risk.  Significant Labs:   Significant Imaging Studies:   Antibiotic Therapy: Anti-infectives (From admission, onward)    Start     Dose/Rate Route Frequency Ordered Stop   04/16/24 2300  cefTRIAXone  (ROCEPHIN ) 2 g in sodium chloride  0.9 % 100 mL IVPB        2 g 200 mL/hr over 30 Minutes Intravenous Every 24 hours 04/16/24 2249     04/16/24 2300  metroNIDAZOLE  (FLAGYL ) IVPB 500 mg        500 mg 100 mL/hr over 60 Minutes Intravenous Every 12 hours 04/16/24 2249         Procedures:   Consultants:     Assessment and Plan: * Stercoral colitis 04-17-2024 Hannah Fisher was manually disimpacted by EDP. Given enema. Will need to be on bowel program at ALF. Will give bid miralax , Linzess , senekot. DC to home with flagyl  500 mg bid x 7 days.  Fecal impaction (HCC) 04-17-2024 Hannah Fisher was manually disimpacted  by EDP. Given enema. Will need to be on bowel program at ALF. Will give bid miralax , Linzess , senekot. DC to home with flagyl  500 mg bid x 7 days.  Chronic constipation 04-17-2024 Hannah Fisher was manually disimpacted by EDP. Given enema. Will need to be on bowel program at ALF. Will give bid miralax , Linzess , senekot. DC to home with flagyl  500 mg bid x 7 days.  Parkinson's disease dementia (HCC) 04-17-2024 on remeron  and sinemet .  Essential hypertension 04-17-2024 stable on lisinopril  2.5 mg daily.  Parkinson disease (HCC) 04-17-2024 stable and chronic. Continue sinemet .   DVT prophylaxis: SCDs Start: 04/17/24 0210    Code Status: Full Code Family Communication: no family at bedside Disposition Plan: return to ALF Reason for continuing need for hospitalization: stable for DC.  Objective: Vitals:   04/17/24 0458 04/17/24 0818 04/17/24 1102 04/17/24 1217  BP: (!) 142/74 (!) 140/93 122/77 133/83  Pulse: 64 74 84 86  Resp: 14 19  18   Temp: (!) 97.5 F (36.4 C) 98.1 F (36.7 C)  98.4 F (36.9 C)  TempSrc: Axillary     SpO2: 100% 100%  100%  Weight:      Height:        Intake/Output Summary (Last 24 hours) at 04/17/2024 1415 Last data filed at 04/17/2024 1032 Gross per 24 hour  Intake 432.02 ml  Output --  Net 432.02 ml   Filed Weights   04/16/24 1948 04/17/24 0247  Weight: 45 kg  36.5 kg    Examination:  Physical Exam Vitals and nursing note reviewed.  Constitutional:      General: She is not in acute distress.    Appearance: She is normal weight. She is not toxic-appearing or diaphoretic.     Comments: Appears chronically ill  HENT:     Head: Normocephalic and atraumatic.  Eyes:     General: No scleral icterus. Pulmonary:     Effort: Pulmonary effort is normal.  Abdominal:     General: Abdomen is flat. Bowel sounds are normal. There is no distension.  Musculoskeletal:     Right lower leg: No edema.  Skin:    General: Skin is warm and dry.     Capillary Refill:  Capillary refill takes less than 2 seconds.  Neurological:     Mental Status: She is disoriented.     Data Reviewed: I have personally reviewed following labs and imaging studies  CBC: Recent Labs  Lab 04/16/24 2123  WBC 5.8  NEUTROABS 4.1  HGB 10.9*  HCT 33.3*  MCV 93.0  PLT 214   Basic Metabolic Panel: Recent Labs  Lab 04/16/24 2123  NA 139  K 3.6  CL 107  CO2 24  GLUCOSE 97  BUN 17  CREATININE 0.67  CALCIUM  9.5   GFR: Estimated Creatinine Clearance: 35.5 mL/min (by C-G formula based on SCr of 0.67 mg/dL). Thyroid  Function Tests: Recent Labs    04/16/24 2123  TSH 1.179    Recent Results (from the past 240 hours)  MRSA Next Gen by PCR, Nasal     Status: None   Collection Time: 04/17/24  3:55 AM   Specimen: Nasal Mucosa; Nasal Swab  Result Value Ref Range Status   MRSA by PCR Next Gen NOT DETECTED NOT DETECTED Final    Comment: (NOTE) The GeneXpert MRSA Assay (FDA approved for NASAL specimens only), is one component of a comprehensive MRSA colonization surveillance program. It is not intended to diagnose MRSA infection nor to guide or monitor treatment for MRSA infections. Test performance is not FDA approved in patients less than 66 years old. Performed at [facility], [address] [place], [place] [address]      Radiology Studies: CT ABDOMEN PELVIS WO CONTRAST Result Date: 04/16/2024 CLINICAL DATA:  Acute abdominal pain EXAM: CT ABDOMEN AND PELVIS WITHOUT CONTRAST TECHNIQUE: Multidetector CT imaging of the abdomen and pelvis was performed following the standard protocol without IV contrast. RADIATION DOSE REDUCTION: This exam was performed according to the departmental dose-optimization program which includes automated exposure control, adjustment of the mA and/or kV according to patient size and/or use of iterative reconstruction technique. COMPARISON:  CT abdomen and pelvis 05/22/2023. FINDINGS: Lower chest: There is a 3 mm left lower lobe  nodule image 4/15. There is atelectasis or scarring in the lung bases. Hepatobiliary: No focal liver abnormality is seen. No gallstones, gallbladder wall thickening, or biliary dilatation. The Pancreas: Unremarkable. No pancreatic ductal dilatation or surrounding inflammatory changes. Spleen: Normal in size without focal abnormality. Adrenals/Urinary Tract: Adrenal glands are unremarkable. Kidneys are normal, without renal calculi, focal lesion, or hydronephrosis. Bladder is unremarkable. Stomach/Bowel: Stomach is within normal limits. Appendix appears normal. There is no evidence for bowel obstruction, pneumatosis or free air. There is a large amount of stool throughout the entire colon. The rectum is dilated with a large amount of stool measuring 7.5 cm in diameter. There is wall thickening with mild surrounding inflammation rectum. Vascular/Lymphatic: Aortic atherosclerosis. No enlarged abdominal or  pelvic lymph nodes. Reproductive: Status post hysterectomy. No adnexal masses. Other: There is no ascites or free air. There is no focal abdominal wall hernia. Musculoskeletal: L4 compression deformity is chronic and unchanged IMPRESSION: 1. Findings compatible with fecal impaction with stercoral colitis. 2. 3 mm left solid pulmonary nodule. No follow-up needed if patient is low-risk.This recommendation follows the consensus statement: Guidelines for Management of Incidental Pulmonary Nodules Detected on CT Images: From the Fleischner Society 2017; Radiology 2017; 284:228-243. Aortic Atherosclerosis (ICD10-I70.0). Electronically Signed   By: Greig Pique M.D.   On: 04/16/2024 21:06    Scheduled Meds:  aspirin  EC  81 mg Oral Daily   carbidopa -levodopa   1.5 tablet Oral Q24H   carbidopa -levodopa   2 tablet Oral BID WC   docusate sodium   100 mg Oral Once   folic acid   1,000 mcg Oral Daily   linaclotide   145 mcg Oral QAC breakfast   lisinopril   2.5 mg Oral Daily   mirtazapine   7.5 mg Oral QHS   polyethylene  glycol  17 g Oral BID   senna  2 tablet Oral QHS   sodium phosphate  1 enema Rectal Once   thiamine   200 mg Oral Daily   Continuous Infusions:  cefTRIAXone  (ROCEPHIN )  IV Stopped (04/17/24 0011)   metronidazole  500 mg (04/17/24 1123)     LOS: 0 days   Time spent: 55 minutes  Camellia Door, DO  [facility]  04/17/2024, 2:15 PM

## 2024-04-17 NOTE — Progress Notes (Signed)
 Physical Therapy Evaluation Patient Details Name: Hannah Fisher MRN: [id number] DOB: 22-Aug-1951 Today's Date: 04/17/2024  History of Present Illness  Pt is a 73 y/o female admitted secondary to fecal impaction and Stercoral colitis. PMH including but not limited to Parkinson's disease, dementia, recurrent falls, hypertension, malnutrition/nutritional deficiency, mood disorder.  Clinical Impression  Pt presented supine in bed with HOB elevated, awake and willing to participate in therapy session. Pt pleasant and agreeable to therapy evaluation. Pt with cognitive deficits at baseline with no family/caregivers present to provide reliable information or confirm information pt provided for PLOF. Pt initially reporting that she ambulated with use of a rollator but later stated that she actually uses a wc for mobility. Per chart review, pt is from [facility]. At the time of evaluation, pt limited with mobility but overall needing min A for functional mobility tasks including bed mobility, sit<>stand transfers and taking side steps at EOB with 2HHA. Pt was very anxious and fearful of falling. PT providing encouragement and reassurance of pt's safety throughout. Pt would continue to benefit from skilled physical therapy services at this time while admitted and after d/c to address the below listed limitations in order to improve overall safety and independence with functional mobility.         If plan is discharge home, recommend the following: A little help with walking and/or transfers;A lot of help with bathing/dressing/bathroom;Supervision due to cognitive status   Can travel by private vehicle        Equipment Recommendations None recommended by PT  Recommendations for Other Services       Functional Status Assessment Patient has had a recent decline in their functional status and demonstrates the ability to make significant improvements in function in a reasonable and predictable amount of  time.     Precautions / Restrictions Precautions Precautions: Fall Recall of Precautions/Restrictions: Impaired Precaution/Restrictions Comments: pt with multiple falls, dx of PD and dementia at baseline Restrictions Weight Bearing Restrictions Per Provider Order: No      Mobility  Bed Mobility Overal bed mobility: Needs Assistance Bed Mobility: Supine to Sit, Sit to Supine     Supine to sit: Min assist Sit to supine: Min assist   General bed mobility comments: increased time and effort, min A for bilateral LE management off of and back onto bed, min A for trunk management to achieve an upright sitting position at EOB    Transfers Overall transfer level: Needs assistance Equipment used: 2 person hand held assist Transfers: Sit to/from Stand Sit to Stand: Min assist           General transfer comment: pt able to complete sit<>stand from EOB x2 with 2HHA and min A for stability    Ambulation/Gait Ambulation/Gait assistance: Min assist   Assistive device: 2 person hand held assist         General Gait Details: pt able to take several small side steps at EOB towards her R side towards HOB with min A and 2HHA for stability  Stairs            Wheelchair Mobility     Tilt Bed    Modified Rankin (Stroke Patients Only)       Balance Overall balance assessment: History of Falls, Needs assistance Sitting-balance support: Feet supported Sitting balance-Leahy Scale: Fair Sitting balance - Comments: pt able to sit EOB with close supervision for safety   Standing balance support: During functional activity, Bilateral upper extremity supported Standing balance-Leahy Scale: Poor  Pertinent Vitals/Pain Pain Assessment Pain Assessment: Faces Faces Pain Scale: Hurts little more Pain Location: abdomen Pain Descriptors / Indicators: Guarding, Sore Pain Intervention(s): Monitored during session, Repositioned    Home  Living Family/patient expects to be discharged to:: Other (Comment)                   Additional Comments: from [facility]    Prior Function Prior Level of Function : Needs assist             Mobility Comments: pt reporting that she utilizes a w/c for mobility and is able to complete transfers independently (unsure of accuracy of information provided, no family/caregivers present) ADLs Comments: pt reporting that the staff at her LTC are available if needed (unsure of accuracy of information provided, no family/caregivers present)     Extremity/Trunk Assessment   Upper Extremity Assessment Upper Extremity Assessment: Defer to OT evaluation    Lower Extremity Assessment Lower Extremity Assessment: Difficult to assess due to impaired cognition       Communication   Communication Communication: Impaired Factors Affecting Communication: Reduced clarity of speech    Cognition Arousal: Alert Behavior During Therapy: Restless, Anxious   PT - Cognitive impairments: History of cognitive impairments                       PT - Cognition Comments: pt with Parkinson's and dementia at baseline Following commands: Impaired Following commands impaired: Follows one step commands inconsistently, Follows one step commands with increased time     Cueing Cueing Techniques: Verbal cues, Gestural cues, Tactile cues     General Comments      Exercises     Assessment/Plan    PT Assessment Patient needs continued PT services  PT Problem List Decreased strength;Decreased activity tolerance;Decreased balance;Decreased mobility;Decreased coordination;Decreased cognition;Decreased knowledge of use of DME;Decreased safety awareness;Decreased knowledge of precautions       PT Treatment Interventions DME instruction;Gait training;Functional mobility training;Therapeutic activities;Therapeutic exercise;Balance training;Neuromuscular re-education;Cognitive  remediation;Patient/family education;Wheelchair mobility training    PT Goals (Current goals can be found in the Care Plan section)  Acute Rehab PT Goals Patient Stated Goal: to take a nap PT Goal Formulation: Patient unable to participate in goal setting Time For Goal Achievement: 05/01/24 Potential to Achieve Goals: Fair    Frequency Min 3X/week     Co-evaluation PT/OT/SLP Co-Evaluation/Treatment: Yes Reason for Co-Treatment: Necessary to address cognition/behavior during functional activity;For patient/therapist safety;To address functional/ADL transfers PT goals addressed during session: Mobility/safety with mobility;Balance;Proper use of DME;Strengthening/ROM         AM-PAC PT 6 Clicks Mobility  Outcome Measure Help needed turning from your back to your side while in a flat bed without using bedrails?: A Little Help needed moving from lying on your back to sitting on the side of a flat bed without using bedrails?: A Little Help needed moving to and from a bed to a chair (including a wheelchair)?: A Little Help needed standing up from a chair using your arms (e.g., wheelchair or bedside chair)?: A Little Help needed to walk in hospital room?: A Lot Help needed climbing 3-5 steps with a railing? : Total 6 Click Score: 15    End of Session   Activity Tolerance: Patient tolerated treatment well Patient left: in bed;with call bell/phone within reach;with bed alarm set Nurse Communication: Mobility status;Other (comment) (blood backflow in pt's IV) PT Visit Diagnosis: Other abnormalities of gait and mobility (R26.89)    Time: 8774-8760 PT  Time Calculation (min) (ACUTE ONLY): 14 min   Charges:   PT Evaluation $PT Eval Low Complexity: 1 Low   PT General Charges $$ ACUTE PT VISIT: 1 Visit         Delon DELENA KLEIN, DPT  Acute Rehabilitation Services Office [phone]   Delon HERO Ayslin Kundert 04/17/2024, 12:57 PM

## 2024-04-17 NOTE — Assessment & Plan Note (Signed)
 04-17-2024 stable on lisinopril  2.5 mg daily.

## 2024-04-17 NOTE — Evaluation (Signed)
 Occupational Therapy Evaluation Patient Details Name: Hannah Fisher MRN: [id number] DOB: 1950/11/04 Today's Date: 04/17/2024   History of Present Illness   Pt is a 73 y/o female admitted secondary to fecal impaction and Stercoral colitis. PMH including but not limited to Parkinson's disease, dementia, recurrent falls, hypertension, malnutrition/nutritional deficiency, mood disorder.     Clinical Impressions PTA, pt resides at [facility], reports use of wheelchair for mobility and intermittent assist for ADLs. Unsure of pt reports d/t baseline dementia. Pt presents now with deficits in balance and strength. Pt able to complete transfers with Min A via handheld assist but deferred further mobility attempts due to fatigue. Based on functional abilities, pt requiring overall Mod A for ADLs. Will follow distantly if pt to remain admitted though anticipate no OT follow up needed at [facility].      If plan is discharge home, recommend the following:   A lot of help with walking and/or transfers;A lot of help with bathing/dressing/bathroom     Functional Status Assessment   Patient has had a recent decline in their functional status and demonstrates the ability to make significant improvements in function in a reasonable and predictable amount of time.     Equipment Recommendations   None recommended by OT     Recommendations for Other Services         Precautions/Restrictions   Precautions Precautions: Fall Recall of Precautions/Restrictions: Impaired Precaution/Restrictions Comments: pt with multiple falls, dx of PD and dementia at baseline Restrictions Weight Bearing Restrictions Per Provider Order: No     Mobility Bed Mobility Overal bed mobility: Needs Assistance Bed Mobility: Supine to Sit, Sit to Supine     Supine to sit: Min assist Sit to supine: Min assist   General bed mobility comments: increased time and effort, min A for bilateral LE management off of  and back onto bed, min A for trunk management to achieve an upright sitting position at EOB    Transfers Overall transfer level: Needs assistance Equipment used: 2 person hand held assist Transfers: Sit to/from Stand Sit to Stand: Min assist           General transfer comment: pt able to complete sit<>stand from EOB x2 with 2HHA and min A for stability      Balance Overall balance assessment: History of Falls, Needs assistance Sitting-balance support: Feet supported Sitting balance-Leahy Scale: Fair Sitting balance - Comments: pt able to sit EOB with close supervision for safety   Standing balance support: During functional activity, Bilateral upper extremity supported Standing balance-Leahy Scale: Poor                             ADL either performed or assessed with clinical judgement   ADL Overall ADL's : Needs assistance/impaired Eating/Feeding: Set up   Grooming: Minimal assistance;Sitting   Upper Body Bathing: Moderate assistance;Sitting   Lower Body Bathing: Moderate assistance;Sitting/lateral leans;Sit to/from stand   Upper Body Dressing : Moderate assistance;Sitting   Lower Body Dressing: Moderate assistance;Sitting/lateral leans;Sit to/from stand   Toilet Transfer: Minimal assistance;Stand-pivot;BSC/3in1   Toileting- Clothing Manipulation and Hygiene: Maximal assistance;Sit to/from stand;Sitting/lateral lean         General ADL Comments: Difficult to fully assess ADLs due to impaired cognition and pt requiring encouragement to attempt tasks, perseverating on desire to take a nap     Vision Ability to See in Adequate Light: 0 Adequate Patient Visual Report: No change from baseline;Other (comment) (appears [facility])  Vision Assessment?: No apparent visual deficits     Perception         Praxis         Pertinent Vitals/Pain Pain Assessment Pain Assessment: Faces Faces Pain Scale: Hurts little more Pain Location: abdomen Pain Descriptors  / Indicators: Guarding, Sore Pain Intervention(s): Monitored during session, Limited activity within patient's tolerance     Extremity/Trunk Assessment Upper Extremity Assessment Upper Extremity Assessment: Generalized weakness;Difficult to assess due to impaired cognition   Lower Extremity Assessment Lower Extremity Assessment: Defer to PT evaluation   Cervical / Trunk Assessment Cervical / Trunk Assessment: Normal   Communication Communication Communication: Impaired Factors Affecting Communication: Reduced clarity of speech   Cognition Arousal: Alert Behavior During Therapy: Restless, Anxious Cognition: History of cognitive impairments             OT - Cognition Comments: hx of dementia, decreased orientation and awareness of situation. pleasant, following commands with repetition and multimodal cues.                 Following commands: Impaired Following commands impaired: Follows one step commands inconsistently, Follows one step commands with increased time     Cueing  General Comments   Cueing Techniques: Verbal cues;Gestural cues;Tactile cues      Exercises     Shoulder Instructions      Home Living Family/patient expects to be discharged to:: Other (Comment)                                 Additional Comments: from [facility]      Prior Functioning/Environment Prior Level of Function : Needs assist             Mobility Comments: pt reporting that she utilizes a w/c for mobility and is able to complete transfers independently (unsure of accuracy of information provided, no family/caregivers present) ADLs Comments: pt reporting that the staff provide intermittent ADL assist as needed (unsure of accuracy of information provided, no family/caregivers present)    OT Problem List: Decreased strength;Decreased activity tolerance;Impaired balance (sitting and/or standing);Decreased cognition;Decreased safety awareness   OT  Treatment/Interventions: Therapeutic exercise;Self-care/ADL training;Energy conservation;DME and/or AE instruction;Therapeutic activities;Patient/family education      OT Goals(Current goals can be found in the care plan section)   Acute Rehab OT Goals Patient Stated Goal: take a nap OT Goal Formulation: Patient unable to participate in goal setting Time For Goal Achievement: 05/01/24 Potential to Achieve Goals: Fair ADL Goals Pt Will Perform Grooming: standing;with supervision Pt Will Transfer to Toilet: with set-up;stand pivot transfer;bedside commode   OT Frequency:  Min 1X/week    Co-evaluation PT/OT/SLP Co-Evaluation/Treatment: Yes Reason for Co-Treatment: Necessary to address cognition/behavior during functional activity;For patient/therapist safety;To address functional/ADL transfers PT goals addressed during session: Mobility/safety with mobility;Balance;Proper use of DME;Strengthening/ROM OT goals addressed during session: ADL's and self-care;Proper use of Adaptive equipment and DME      AM-PAC OT 6 Clicks Daily Activity     Outcome Measure Help from another person eating meals?: A Little Help from another person taking care of personal grooming?: A Little Help from another person toileting, which includes using toliet, bedpan, or urinal?: A Lot Help from another person bathing (including washing, rinsing, drying)?: A Lot Help from another person to put on and taking off regular upper body clothing?: A Lot Help from another person to put on and taking off regular lower body clothing?: A Lot 6 Click Score:  14   End of Session Nurse Communication: Mobility status;Other (comment) (IV with blood coming back into line)  Activity Tolerance: Patient tolerated treatment well;Patient limited by fatigue Patient left: in bed;with call bell/phone within reach;with bed alarm set  OT Visit Diagnosis: Other symptoms and signs involving cognitive function;Unsteadiness on feet  (R26.81);History of falling (Z91.81);Muscle weakness (generalized) (M62.81)                Time: 8775-8762 OT Time Calculation (min): 13 min Charges:  OT General Charges $OT Visit: 1 Visit OT Evaluation $OT Eval Low Complexity: 1 Low  Mliss NOVAK, OTR/L Acute Rehab Services Office: [phone]   Mliss Fish 04/17/2024, 1:06 PM

## 2024-04-17 NOTE — Plan of Care (Signed)

## 2024-04-17 NOTE — Care Management Obs Status (Signed)
 MEDICARE OBSERVATION STATUS NOTIFICATION   Patient Details  Name: Hannah Fisher MRN: [id number] Date of Birth: 30-May-1951   Medicare Observation Status Notification Given:  No  Due to illness patient was not abe to signed I called patient daughter to make her aware and will send a letter to her.   Vonnie Spagnolo 04/17/2024, 3:02 PM

## 2024-04-18 DIAGNOSIS — Z743 Need for continuous supervision: Secondary | ICD-10-CM | POA: Diagnosis not present

## 2024-04-18 DIAGNOSIS — R1084 Generalized abdominal pain: Secondary | ICD-10-CM | POA: Diagnosis not present

## 2024-04-18 DIAGNOSIS — K5289 Other specified noninfective gastroenteritis and colitis: Secondary | ICD-10-CM | POA: Diagnosis not present

## 2024-04-18 NOTE — Plan of Care (Signed)

## 2024-04-24 ENCOUNTER — Ambulatory Visit: Admitting: Podiatry

## 2024-04-30 DIAGNOSIS — E46 Unspecified protein-calorie malnutrition: Secondary | ICD-10-CM | POA: Diagnosis not present

## 2024-04-30 DIAGNOSIS — K5900 Constipation, unspecified: Secondary | ICD-10-CM | POA: Diagnosis not present

## 2024-04-30 DIAGNOSIS — Z09 Encounter for follow-up examination after completed treatment for conditions other than malignant neoplasm: Secondary | ICD-10-CM | POA: Diagnosis not present

## 2024-04-30 DIAGNOSIS — G20A1 Parkinson's disease without dyskinesia, without mention of fluctuations: Secondary | ICD-10-CM | POA: Diagnosis not present

## 2024-04-30 DIAGNOSIS — R499 Unspecified voice and resonance disorder: Secondary | ICD-10-CM | POA: Diagnosis not present

## 2024-04-30 DIAGNOSIS — Z681 Body mass index (BMI) 19 or less, adult: Secondary | ICD-10-CM | POA: Diagnosis not present

## 2024-05-05 DIAGNOSIS — I10 Essential (primary) hypertension: Secondary | ICD-10-CM | POA: Diagnosis not present

## 2024-05-05 DIAGNOSIS — R911 Solitary pulmonary nodule: Secondary | ICD-10-CM | POA: Diagnosis not present

## 2024-05-05 DIAGNOSIS — E46 Unspecified protein-calorie malnutrition: Secondary | ICD-10-CM | POA: Diagnosis not present

## 2024-05-05 DIAGNOSIS — F39 Unspecified mood [affective] disorder: Secondary | ICD-10-CM | POA: Diagnosis not present

## 2024-05-05 DIAGNOSIS — F0283 Dementia in other diseases classified elsewhere, unspecified severity, with mood disturbance: Secondary | ICD-10-CM | POA: Diagnosis not present

## 2024-05-05 DIAGNOSIS — I700 Atherosclerosis of aorta: Secondary | ICD-10-CM | POA: Diagnosis not present

## 2024-05-05 DIAGNOSIS — K5909 Other constipation: Secondary | ICD-10-CM | POA: Diagnosis not present

## 2024-05-05 DIAGNOSIS — Z9181 History of falling: Secondary | ICD-10-CM | POA: Diagnosis not present

## 2024-05-05 DIAGNOSIS — G20A1 Parkinson's disease without dyskinesia, without mention of fluctuations: Secondary | ICD-10-CM | POA: Diagnosis not present

## 2024-05-08 DIAGNOSIS — I10 Essential (primary) hypertension: Secondary | ICD-10-CM | POA: Diagnosis not present

## 2024-05-08 DIAGNOSIS — F0283 Dementia in other diseases classified elsewhere, unspecified severity, with mood disturbance: Secondary | ICD-10-CM | POA: Diagnosis not present

## 2024-05-08 DIAGNOSIS — F39 Unspecified mood [affective] disorder: Secondary | ICD-10-CM | POA: Diagnosis not present

## 2024-05-08 DIAGNOSIS — K5909 Other constipation: Secondary | ICD-10-CM | POA: Diagnosis not present

## 2024-05-08 DIAGNOSIS — R911 Solitary pulmonary nodule: Secondary | ICD-10-CM | POA: Diagnosis not present

## 2024-05-08 DIAGNOSIS — I700 Atherosclerosis of aorta: Secondary | ICD-10-CM | POA: Diagnosis not present

## 2024-05-08 DIAGNOSIS — G20A1 Parkinson's disease without dyskinesia, without mention of fluctuations: Secondary | ICD-10-CM | POA: Diagnosis not present

## 2024-05-08 DIAGNOSIS — Z9181 History of falling: Secondary | ICD-10-CM | POA: Diagnosis not present

## 2024-05-08 DIAGNOSIS — E46 Unspecified protein-calorie malnutrition: Secondary | ICD-10-CM | POA: Diagnosis not present

## 2024-05-12 DIAGNOSIS — K5909 Other constipation: Secondary | ICD-10-CM | POA: Diagnosis not present

## 2024-05-12 DIAGNOSIS — E46 Unspecified protein-calorie malnutrition: Secondary | ICD-10-CM | POA: Diagnosis not present

## 2024-05-12 DIAGNOSIS — G479 Sleep disorder, unspecified: Secondary | ICD-10-CM | POA: Diagnosis not present

## 2024-05-12 DIAGNOSIS — I10 Essential (primary) hypertension: Secondary | ICD-10-CM | POA: Diagnosis not present

## 2024-05-12 DIAGNOSIS — F419 Anxiety disorder, unspecified: Secondary | ICD-10-CM | POA: Diagnosis not present

## 2024-05-12 DIAGNOSIS — H9192 Unspecified hearing loss, left ear: Secondary | ICD-10-CM | POA: Diagnosis not present

## 2024-05-12 DIAGNOSIS — R490 Dysphonia: Secondary | ICD-10-CM | POA: Diagnosis not present

## 2024-05-12 DIAGNOSIS — G20A1 Parkinson's disease without dyskinesia, without mention of fluctuations: Secondary | ICD-10-CM | POA: Diagnosis not present

## 2024-05-12 DIAGNOSIS — M810 Age-related osteoporosis without current pathological fracture: Secondary | ICD-10-CM | POA: Diagnosis not present

## 2024-05-14 DIAGNOSIS — E46 Unspecified protein-calorie malnutrition: Secondary | ICD-10-CM | POA: Diagnosis not present

## 2024-05-14 DIAGNOSIS — I700 Atherosclerosis of aorta: Secondary | ICD-10-CM | POA: Diagnosis not present

## 2024-05-14 DIAGNOSIS — G20A1 Parkinson's disease without dyskinesia, without mention of fluctuations: Secondary | ICD-10-CM | POA: Diagnosis not present

## 2024-05-14 DIAGNOSIS — F0283 Dementia in other diseases classified elsewhere, unspecified severity, with mood disturbance: Secondary | ICD-10-CM | POA: Diagnosis not present

## 2024-05-14 DIAGNOSIS — K5909 Other constipation: Secondary | ICD-10-CM | POA: Diagnosis not present

## 2024-05-14 DIAGNOSIS — F39 Unspecified mood [affective] disorder: Secondary | ICD-10-CM | POA: Diagnosis not present

## 2024-05-14 DIAGNOSIS — Z9181 History of falling: Secondary | ICD-10-CM | POA: Diagnosis not present

## 2024-05-14 DIAGNOSIS — I10 Essential (primary) hypertension: Secondary | ICD-10-CM | POA: Diagnosis not present

## 2024-05-14 DIAGNOSIS — R911 Solitary pulmonary nodule: Secondary | ICD-10-CM | POA: Diagnosis not present

## 2024-05-15 DIAGNOSIS — Z9181 History of falling: Secondary | ICD-10-CM | POA: Diagnosis not present

## 2024-05-15 DIAGNOSIS — R911 Solitary pulmonary nodule: Secondary | ICD-10-CM | POA: Diagnosis not present

## 2024-05-15 DIAGNOSIS — K5909 Other constipation: Secondary | ICD-10-CM | POA: Diagnosis not present

## 2024-05-15 DIAGNOSIS — F39 Unspecified mood [affective] disorder: Secondary | ICD-10-CM | POA: Diagnosis not present

## 2024-05-15 DIAGNOSIS — G20A1 Parkinson's disease without dyskinesia, without mention of fluctuations: Secondary | ICD-10-CM | POA: Diagnosis not present

## 2024-05-15 DIAGNOSIS — E46 Unspecified protein-calorie malnutrition: Secondary | ICD-10-CM | POA: Diagnosis not present

## 2024-05-15 DIAGNOSIS — I10 Essential (primary) hypertension: Secondary | ICD-10-CM | POA: Diagnosis not present

## 2024-05-15 DIAGNOSIS — F0283 Dementia in other diseases classified elsewhere, unspecified severity, with mood disturbance: Secondary | ICD-10-CM | POA: Diagnosis not present

## 2024-05-15 DIAGNOSIS — I700 Atherosclerosis of aorta: Secondary | ICD-10-CM | POA: Diagnosis not present

## 2024-05-19 DIAGNOSIS — R911 Solitary pulmonary nodule: Secondary | ICD-10-CM | POA: Diagnosis not present

## 2024-05-19 DIAGNOSIS — G20A1 Parkinson's disease without dyskinesia, without mention of fluctuations: Secondary | ICD-10-CM | POA: Diagnosis not present

## 2024-05-19 DIAGNOSIS — F0283 Dementia in other diseases classified elsewhere, unspecified severity, with mood disturbance: Secondary | ICD-10-CM | POA: Diagnosis not present

## 2024-05-19 DIAGNOSIS — E46 Unspecified protein-calorie malnutrition: Secondary | ICD-10-CM | POA: Diagnosis not present

## 2024-05-19 DIAGNOSIS — F39 Unspecified mood [affective] disorder: Secondary | ICD-10-CM | POA: Diagnosis not present

## 2024-05-19 DIAGNOSIS — K5909 Other constipation: Secondary | ICD-10-CM | POA: Diagnosis not present

## 2024-05-19 DIAGNOSIS — I700 Atherosclerosis of aorta: Secondary | ICD-10-CM | POA: Diagnosis not present

## 2024-05-19 DIAGNOSIS — Z9181 History of falling: Secondary | ICD-10-CM | POA: Diagnosis not present

## 2024-05-19 DIAGNOSIS — I10 Essential (primary) hypertension: Secondary | ICD-10-CM | POA: Diagnosis not present

## 2024-05-21 DIAGNOSIS — G20A1 Parkinson's disease without dyskinesia, without mention of fluctuations: Secondary | ICD-10-CM | POA: Diagnosis not present

## 2024-05-21 DIAGNOSIS — Z9181 History of falling: Secondary | ICD-10-CM | POA: Diagnosis not present

## 2024-05-21 DIAGNOSIS — R911 Solitary pulmonary nodule: Secondary | ICD-10-CM | POA: Diagnosis not present

## 2024-05-21 DIAGNOSIS — F39 Unspecified mood [affective] disorder: Secondary | ICD-10-CM | POA: Diagnosis not present

## 2024-05-21 DIAGNOSIS — I10 Essential (primary) hypertension: Secondary | ICD-10-CM | POA: Diagnosis not present

## 2024-05-21 DIAGNOSIS — K5909 Other constipation: Secondary | ICD-10-CM | POA: Diagnosis not present

## 2024-05-21 DIAGNOSIS — F0283 Dementia in other diseases classified elsewhere, unspecified severity, with mood disturbance: Secondary | ICD-10-CM | POA: Diagnosis not present

## 2024-05-21 DIAGNOSIS — I700 Atherosclerosis of aorta: Secondary | ICD-10-CM | POA: Diagnosis not present

## 2024-05-21 DIAGNOSIS — E46 Unspecified protein-calorie malnutrition: Secondary | ICD-10-CM | POA: Diagnosis not present

## 2024-05-23 DIAGNOSIS — I700 Atherosclerosis of aorta: Secondary | ICD-10-CM | POA: Diagnosis not present

## 2024-05-23 DIAGNOSIS — F0283 Dementia in other diseases classified elsewhere, unspecified severity, with mood disturbance: Secondary | ICD-10-CM | POA: Diagnosis not present

## 2024-05-23 DIAGNOSIS — Z9181 History of falling: Secondary | ICD-10-CM | POA: Diagnosis not present

## 2024-05-23 DIAGNOSIS — E46 Unspecified protein-calorie malnutrition: Secondary | ICD-10-CM | POA: Diagnosis not present

## 2024-05-23 DIAGNOSIS — F39 Unspecified mood [affective] disorder: Secondary | ICD-10-CM | POA: Diagnosis not present

## 2024-05-23 DIAGNOSIS — I10 Essential (primary) hypertension: Secondary | ICD-10-CM | POA: Diagnosis not present

## 2024-05-23 DIAGNOSIS — K5909 Other constipation: Secondary | ICD-10-CM | POA: Diagnosis not present

## 2024-05-23 DIAGNOSIS — R911 Solitary pulmonary nodule: Secondary | ICD-10-CM | POA: Diagnosis not present

## 2024-05-23 DIAGNOSIS — G20A1 Parkinson's disease without dyskinesia, without mention of fluctuations: Secondary | ICD-10-CM | POA: Diagnosis not present

## 2024-05-26 DIAGNOSIS — I10 Essential (primary) hypertension: Secondary | ICD-10-CM | POA: Diagnosis not present

## 2024-05-26 DIAGNOSIS — K5909 Other constipation: Secondary | ICD-10-CM | POA: Diagnosis not present

## 2024-05-26 DIAGNOSIS — F0283 Dementia in other diseases classified elsewhere, unspecified severity, with mood disturbance: Secondary | ICD-10-CM | POA: Diagnosis not present

## 2024-05-26 DIAGNOSIS — R911 Solitary pulmonary nodule: Secondary | ICD-10-CM | POA: Diagnosis not present

## 2024-05-26 DIAGNOSIS — F39 Unspecified mood [affective] disorder: Secondary | ICD-10-CM | POA: Diagnosis not present

## 2024-05-26 DIAGNOSIS — I700 Atherosclerosis of aorta: Secondary | ICD-10-CM | POA: Diagnosis not present

## 2024-05-26 DIAGNOSIS — Z9181 History of falling: Secondary | ICD-10-CM | POA: Diagnosis not present

## 2024-05-26 DIAGNOSIS — E46 Unspecified protein-calorie malnutrition: Secondary | ICD-10-CM | POA: Diagnosis not present

## 2024-05-26 DIAGNOSIS — G20A1 Parkinson's disease without dyskinesia, without mention of fluctuations: Secondary | ICD-10-CM | POA: Diagnosis not present

## 2024-05-29 DIAGNOSIS — I700 Atherosclerosis of aorta: Secondary | ICD-10-CM | POA: Diagnosis not present

## 2024-05-29 DIAGNOSIS — R911 Solitary pulmonary nodule: Secondary | ICD-10-CM | POA: Diagnosis not present

## 2024-05-29 DIAGNOSIS — F0283 Dementia in other diseases classified elsewhere, unspecified severity, with mood disturbance: Secondary | ICD-10-CM | POA: Diagnosis not present

## 2024-05-29 DIAGNOSIS — G20A1 Parkinson's disease without dyskinesia, without mention of fluctuations: Secondary | ICD-10-CM | POA: Diagnosis not present

## 2024-05-29 DIAGNOSIS — K5909 Other constipation: Secondary | ICD-10-CM | POA: Diagnosis not present

## 2024-05-29 DIAGNOSIS — I10 Essential (primary) hypertension: Secondary | ICD-10-CM | POA: Diagnosis not present

## 2024-05-29 DIAGNOSIS — E46 Unspecified protein-calorie malnutrition: Secondary | ICD-10-CM | POA: Diagnosis not present

## 2024-05-29 DIAGNOSIS — F39 Unspecified mood [affective] disorder: Secondary | ICD-10-CM | POA: Diagnosis not present

## 2024-05-29 DIAGNOSIS — Z9181 History of falling: Secondary | ICD-10-CM | POA: Diagnosis not present

## 2024-05-30 DIAGNOSIS — I700 Atherosclerosis of aorta: Secondary | ICD-10-CM | POA: Diagnosis not present

## 2024-05-30 DIAGNOSIS — Z9181 History of falling: Secondary | ICD-10-CM | POA: Diagnosis not present

## 2024-05-30 DIAGNOSIS — I10 Essential (primary) hypertension: Secondary | ICD-10-CM | POA: Diagnosis not present

## 2024-05-30 DIAGNOSIS — F39 Unspecified mood [affective] disorder: Secondary | ICD-10-CM | POA: Diagnosis not present

## 2024-05-30 DIAGNOSIS — K5909 Other constipation: Secondary | ICD-10-CM | POA: Diagnosis not present

## 2024-05-30 DIAGNOSIS — F0283 Dementia in other diseases classified elsewhere, unspecified severity, with mood disturbance: Secondary | ICD-10-CM | POA: Diagnosis not present

## 2024-05-30 DIAGNOSIS — G20A1 Parkinson's disease without dyskinesia, without mention of fluctuations: Secondary | ICD-10-CM | POA: Diagnosis not present

## 2024-05-30 DIAGNOSIS — E46 Unspecified protein-calorie malnutrition: Secondary | ICD-10-CM | POA: Diagnosis not present

## 2024-05-30 DIAGNOSIS — R911 Solitary pulmonary nodule: Secondary | ICD-10-CM | POA: Diagnosis not present

## 2024-06-04 DIAGNOSIS — I700 Atherosclerosis of aorta: Secondary | ICD-10-CM | POA: Diagnosis not present

## 2024-06-04 DIAGNOSIS — E46 Unspecified protein-calorie malnutrition: Secondary | ICD-10-CM | POA: Diagnosis not present

## 2024-06-04 DIAGNOSIS — F39 Unspecified mood [affective] disorder: Secondary | ICD-10-CM | POA: Diagnosis not present

## 2024-06-04 DIAGNOSIS — K5909 Other constipation: Secondary | ICD-10-CM | POA: Diagnosis not present

## 2024-06-04 DIAGNOSIS — I10 Essential (primary) hypertension: Secondary | ICD-10-CM | POA: Diagnosis not present

## 2024-06-04 DIAGNOSIS — R911 Solitary pulmonary nodule: Secondary | ICD-10-CM | POA: Diagnosis not present

## 2024-06-04 DIAGNOSIS — Z9181 History of falling: Secondary | ICD-10-CM | POA: Diagnosis not present

## 2024-06-04 DIAGNOSIS — G20A1 Parkinson's disease without dyskinesia, without mention of fluctuations: Secondary | ICD-10-CM | POA: Diagnosis not present

## 2024-06-04 DIAGNOSIS — F0283 Dementia in other diseases classified elsewhere, unspecified severity, with mood disturbance: Secondary | ICD-10-CM | POA: Diagnosis not present

## 2024-06-05 DIAGNOSIS — Z9181 History of falling: Secondary | ICD-10-CM | POA: Diagnosis not present

## 2024-06-05 DIAGNOSIS — I10 Essential (primary) hypertension: Secondary | ICD-10-CM | POA: Diagnosis not present

## 2024-06-05 DIAGNOSIS — E46 Unspecified protein-calorie malnutrition: Secondary | ICD-10-CM | POA: Diagnosis not present

## 2024-06-05 DIAGNOSIS — I700 Atherosclerosis of aorta: Secondary | ICD-10-CM | POA: Diagnosis not present

## 2024-06-05 DIAGNOSIS — F39 Unspecified mood [affective] disorder: Secondary | ICD-10-CM | POA: Diagnosis not present

## 2024-06-05 DIAGNOSIS — F0283 Dementia in other diseases classified elsewhere, unspecified severity, with mood disturbance: Secondary | ICD-10-CM | POA: Diagnosis not present

## 2024-06-05 DIAGNOSIS — K5909 Other constipation: Secondary | ICD-10-CM | POA: Diagnosis not present

## 2024-06-05 DIAGNOSIS — G20A1 Parkinson's disease without dyskinesia, without mention of fluctuations: Secondary | ICD-10-CM | POA: Diagnosis not present

## 2024-06-05 DIAGNOSIS — R911 Solitary pulmonary nodule: Secondary | ICD-10-CM | POA: Diagnosis not present

## 2024-06-06 DIAGNOSIS — Z23 Encounter for immunization: Secondary | ICD-10-CM | POA: Diagnosis not present

## 2024-06-06 DIAGNOSIS — I10 Essential (primary) hypertension: Secondary | ICD-10-CM | POA: Diagnosis not present

## 2024-06-06 DIAGNOSIS — G20A1 Parkinson's disease without dyskinesia, without mention of fluctuations: Secondary | ICD-10-CM | POA: Diagnosis not present

## 2024-06-06 DIAGNOSIS — M810 Age-related osteoporosis without current pathological fracture: Secondary | ICD-10-CM | POA: Diagnosis not present

## 2024-06-06 DIAGNOSIS — H9192 Unspecified hearing loss, left ear: Secondary | ICD-10-CM | POA: Diagnosis not present

## 2024-06-06 DIAGNOSIS — E7800 Pure hypercholesterolemia, unspecified: Secondary | ICD-10-CM | POA: Diagnosis not present

## 2024-06-06 DIAGNOSIS — E46 Unspecified protein-calorie malnutrition: Secondary | ICD-10-CM | POA: Diagnosis not present

## 2024-06-06 DIAGNOSIS — R296 Repeated falls: Secondary | ICD-10-CM | POA: Diagnosis not present

## 2024-06-06 DIAGNOSIS — G479 Sleep disorder, unspecified: Secondary | ICD-10-CM | POA: Diagnosis not present

## 2024-06-06 DIAGNOSIS — E559 Vitamin D deficiency, unspecified: Secondary | ICD-10-CM | POA: Diagnosis not present

## 2024-06-09 DIAGNOSIS — I10 Essential (primary) hypertension: Secondary | ICD-10-CM | POA: Diagnosis not present

## 2024-06-09 DIAGNOSIS — E46 Unspecified protein-calorie malnutrition: Secondary | ICD-10-CM | POA: Diagnosis not present

## 2024-06-09 DIAGNOSIS — R911 Solitary pulmonary nodule: Secondary | ICD-10-CM | POA: Diagnosis not present

## 2024-06-09 DIAGNOSIS — F0283 Dementia in other diseases classified elsewhere, unspecified severity, with mood disturbance: Secondary | ICD-10-CM | POA: Diagnosis not present

## 2024-06-09 DIAGNOSIS — G20A1 Parkinson's disease without dyskinesia, without mention of fluctuations: Secondary | ICD-10-CM | POA: Diagnosis not present

## 2024-06-09 DIAGNOSIS — I700 Atherosclerosis of aorta: Secondary | ICD-10-CM | POA: Diagnosis not present

## 2024-06-09 DIAGNOSIS — K5909 Other constipation: Secondary | ICD-10-CM | POA: Diagnosis not present

## 2024-06-09 DIAGNOSIS — F39 Unspecified mood [affective] disorder: Secondary | ICD-10-CM | POA: Diagnosis not present

## 2024-06-09 DIAGNOSIS — Z9181 History of falling: Secondary | ICD-10-CM | POA: Diagnosis not present

## 2024-06-20 DIAGNOSIS — E46 Unspecified protein-calorie malnutrition: Secondary | ICD-10-CM | POA: Diagnosis not present

## 2024-06-20 DIAGNOSIS — I10 Essential (primary) hypertension: Secondary | ICD-10-CM | POA: Diagnosis not present

## 2024-06-20 DIAGNOSIS — Z9181 History of falling: Secondary | ICD-10-CM | POA: Diagnosis not present

## 2024-06-20 DIAGNOSIS — G20A1 Parkinson's disease without dyskinesia, without mention of fluctuations: Secondary | ICD-10-CM | POA: Diagnosis not present

## 2024-06-20 DIAGNOSIS — F39 Unspecified mood [affective] disorder: Secondary | ICD-10-CM | POA: Diagnosis not present

## 2024-06-20 DIAGNOSIS — F0283 Dementia in other diseases classified elsewhere, unspecified severity, with mood disturbance: Secondary | ICD-10-CM | POA: Diagnosis not present

## 2024-06-20 DIAGNOSIS — I700 Atherosclerosis of aorta: Secondary | ICD-10-CM | POA: Diagnosis not present

## 2024-06-20 DIAGNOSIS — R911 Solitary pulmonary nodule: Secondary | ICD-10-CM | POA: Diagnosis not present

## 2024-06-20 DIAGNOSIS — K5909 Other constipation: Secondary | ICD-10-CM | POA: Diagnosis not present

## 2024-06-25 DIAGNOSIS — G20B1 Parkinson's disease with dyskinesia, without mention of fluctuations: Secondary | ICD-10-CM | POA: Diagnosis not present

## 2024-06-25 NOTE — Telephone Encounter (Signed)
 Medication Access Center Summary  PA Status Approved  Medication Quetiapine 25mg   PA Approval Dates   PA Number 10/03/2023-10/01/2024  [id number]   Provider [facility]   Approval linked to Rx via ePA

## 2024-06-27 DIAGNOSIS — I10 Essential (primary) hypertension: Secondary | ICD-10-CM | POA: Diagnosis not present

## 2024-06-27 DIAGNOSIS — I700 Atherosclerosis of aorta: Secondary | ICD-10-CM | POA: Diagnosis not present

## 2024-06-27 DIAGNOSIS — E46 Unspecified protein-calorie malnutrition: Secondary | ICD-10-CM | POA: Diagnosis not present

## 2024-06-27 DIAGNOSIS — G20A1 Parkinson's disease without dyskinesia, without mention of fluctuations: Secondary | ICD-10-CM | POA: Diagnosis not present

## 2024-06-27 DIAGNOSIS — F39 Unspecified mood [affective] disorder: Secondary | ICD-10-CM | POA: Diagnosis not present

## 2024-06-27 DIAGNOSIS — F0283 Dementia in other diseases classified elsewhere, unspecified severity, with mood disturbance: Secondary | ICD-10-CM | POA: Diagnosis not present

## 2024-06-27 DIAGNOSIS — R911 Solitary pulmonary nodule: Secondary | ICD-10-CM | POA: Diagnosis not present

## 2024-06-27 DIAGNOSIS — Z9181 History of falling: Secondary | ICD-10-CM | POA: Diagnosis not present

## 2024-06-27 DIAGNOSIS — K5909 Other constipation: Secondary | ICD-10-CM | POA: Diagnosis not present

## 2024-06-30 DIAGNOSIS — Z79899 Other long term (current) drug therapy: Secondary | ICD-10-CM | POA: Diagnosis not present

## 2024-07-01 DIAGNOSIS — I10 Essential (primary) hypertension: Secondary | ICD-10-CM | POA: Diagnosis not present

## 2024-07-01 DIAGNOSIS — E46 Unspecified protein-calorie malnutrition: Secondary | ICD-10-CM | POA: Diagnosis not present

## 2024-07-01 DIAGNOSIS — Z9181 History of falling: Secondary | ICD-10-CM | POA: Diagnosis not present

## 2024-07-01 DIAGNOSIS — K5909 Other constipation: Secondary | ICD-10-CM | POA: Diagnosis not present

## 2024-07-01 DIAGNOSIS — F0283 Dementia in other diseases classified elsewhere, unspecified severity, with mood disturbance: Secondary | ICD-10-CM | POA: Diagnosis not present

## 2024-07-01 DIAGNOSIS — G20A1 Parkinson's disease without dyskinesia, without mention of fluctuations: Secondary | ICD-10-CM | POA: Diagnosis not present

## 2024-07-01 DIAGNOSIS — R911 Solitary pulmonary nodule: Secondary | ICD-10-CM | POA: Diagnosis not present

## 2024-07-01 DIAGNOSIS — F39 Unspecified mood [affective] disorder: Secondary | ICD-10-CM | POA: Diagnosis not present

## 2024-07-01 DIAGNOSIS — I700 Atherosclerosis of aorta: Secondary | ICD-10-CM | POA: Diagnosis not present

## 2024-07-04 DIAGNOSIS — Z9181 History of falling: Secondary | ICD-10-CM | POA: Diagnosis not present

## 2024-07-04 DIAGNOSIS — F0283 Dementia in other diseases classified elsewhere, unspecified severity, with mood disturbance: Secondary | ICD-10-CM | POA: Diagnosis not present

## 2024-07-04 DIAGNOSIS — K5909 Other constipation: Secondary | ICD-10-CM | POA: Diagnosis not present

## 2024-07-04 DIAGNOSIS — E46 Unspecified protein-calorie malnutrition: Secondary | ICD-10-CM | POA: Diagnosis not present

## 2024-07-04 DIAGNOSIS — I10 Essential (primary) hypertension: Secondary | ICD-10-CM | POA: Diagnosis not present

## 2024-07-04 DIAGNOSIS — R911 Solitary pulmonary nodule: Secondary | ICD-10-CM | POA: Diagnosis not present

## 2024-07-04 DIAGNOSIS — F39 Unspecified mood [affective] disorder: Secondary | ICD-10-CM | POA: Diagnosis not present

## 2024-07-04 DIAGNOSIS — I700 Atherosclerosis of aorta: Secondary | ICD-10-CM | POA: Diagnosis not present

## 2024-07-04 DIAGNOSIS — G20A1 Parkinson's disease without dyskinesia, without mention of fluctuations: Secondary | ICD-10-CM | POA: Diagnosis not present

## 2024-07-07 DIAGNOSIS — R911 Solitary pulmonary nodule: Secondary | ICD-10-CM | POA: Diagnosis not present

## 2024-07-07 DIAGNOSIS — K5909 Other constipation: Secondary | ICD-10-CM | POA: Diagnosis not present

## 2024-07-07 DIAGNOSIS — F39 Unspecified mood [affective] disorder: Secondary | ICD-10-CM | POA: Diagnosis not present

## 2024-07-07 DIAGNOSIS — Z9181 History of falling: Secondary | ICD-10-CM | POA: Diagnosis not present

## 2024-07-07 DIAGNOSIS — I10 Essential (primary) hypertension: Secondary | ICD-10-CM | POA: Diagnosis not present

## 2024-07-07 DIAGNOSIS — E46 Unspecified protein-calorie malnutrition: Secondary | ICD-10-CM | POA: Diagnosis not present

## 2024-07-07 DIAGNOSIS — I700 Atherosclerosis of aorta: Secondary | ICD-10-CM | POA: Diagnosis not present

## 2024-07-07 DIAGNOSIS — G20A1 Parkinson's disease without dyskinesia, without mention of fluctuations: Secondary | ICD-10-CM | POA: Diagnosis not present

## 2024-07-07 DIAGNOSIS — F0283 Dementia in other diseases classified elsewhere, unspecified severity, with mood disturbance: Secondary | ICD-10-CM | POA: Diagnosis not present

## 2024-07-09 DIAGNOSIS — Z9181 History of falling: Secondary | ICD-10-CM | POA: Diagnosis not present

## 2024-07-09 DIAGNOSIS — G20A1 Parkinson's disease without dyskinesia, without mention of fluctuations: Secondary | ICD-10-CM | POA: Diagnosis not present

## 2024-07-09 DIAGNOSIS — I10 Essential (primary) hypertension: Secondary | ICD-10-CM | POA: Diagnosis not present

## 2024-07-09 DIAGNOSIS — I700 Atherosclerosis of aorta: Secondary | ICD-10-CM | POA: Diagnosis not present

## 2024-07-09 DIAGNOSIS — F39 Unspecified mood [affective] disorder: Secondary | ICD-10-CM | POA: Diagnosis not present

## 2024-07-09 DIAGNOSIS — K5909 Other constipation: Secondary | ICD-10-CM | POA: Diagnosis not present

## 2024-07-09 DIAGNOSIS — F0283 Dementia in other diseases classified elsewhere, unspecified severity, with mood disturbance: Secondary | ICD-10-CM | POA: Diagnosis not present

## 2024-07-09 DIAGNOSIS — E46 Unspecified protein-calorie malnutrition: Secondary | ICD-10-CM | POA: Diagnosis not present

## 2024-07-09 DIAGNOSIS — R911 Solitary pulmonary nodule: Secondary | ICD-10-CM | POA: Diagnosis not present

## 2024-07-17 DIAGNOSIS — F0283 Dementia in other diseases classified elsewhere, unspecified severity, with mood disturbance: Secondary | ICD-10-CM | POA: Diagnosis not present

## 2024-07-17 DIAGNOSIS — F39 Unspecified mood [affective] disorder: Secondary | ICD-10-CM | POA: Diagnosis not present

## 2024-07-17 DIAGNOSIS — I700 Atherosclerosis of aorta: Secondary | ICD-10-CM | POA: Diagnosis not present

## 2024-07-17 DIAGNOSIS — G20A1 Parkinson's disease without dyskinesia, without mention of fluctuations: Secondary | ICD-10-CM | POA: Diagnosis not present

## 2024-07-17 DIAGNOSIS — I10 Essential (primary) hypertension: Secondary | ICD-10-CM | POA: Diagnosis not present

## 2024-07-17 DIAGNOSIS — E46 Unspecified protein-calorie malnutrition: Secondary | ICD-10-CM | POA: Diagnosis not present

## 2024-07-17 DIAGNOSIS — Z9181 History of falling: Secondary | ICD-10-CM | POA: Diagnosis not present

## 2024-07-17 DIAGNOSIS — K5909 Other constipation: Secondary | ICD-10-CM | POA: Diagnosis not present

## 2024-07-17 DIAGNOSIS — R911 Solitary pulmonary nodule: Secondary | ICD-10-CM | POA: Diagnosis not present

## 2024-07-18 DIAGNOSIS — E46 Unspecified protein-calorie malnutrition: Secondary | ICD-10-CM | POA: Diagnosis not present

## 2024-07-18 DIAGNOSIS — I10 Essential (primary) hypertension: Secondary | ICD-10-CM | POA: Diagnosis not present

## 2024-07-18 DIAGNOSIS — R911 Solitary pulmonary nodule: Secondary | ICD-10-CM | POA: Diagnosis not present

## 2024-07-18 DIAGNOSIS — F0283 Dementia in other diseases classified elsewhere, unspecified severity, with mood disturbance: Secondary | ICD-10-CM | POA: Diagnosis not present

## 2024-07-18 DIAGNOSIS — G20A1 Parkinson's disease without dyskinesia, without mention of fluctuations: Secondary | ICD-10-CM | POA: Diagnosis not present

## 2024-07-18 DIAGNOSIS — F39 Unspecified mood [affective] disorder: Secondary | ICD-10-CM | POA: Diagnosis not present

## 2024-07-18 DIAGNOSIS — K5909 Other constipation: Secondary | ICD-10-CM | POA: Diagnosis not present

## 2024-07-18 DIAGNOSIS — Z9181 History of falling: Secondary | ICD-10-CM | POA: Diagnosis not present

## 2024-07-18 DIAGNOSIS — I700 Atherosclerosis of aorta: Secondary | ICD-10-CM | POA: Diagnosis not present

## 2024-07-21 DIAGNOSIS — F0283 Dementia in other diseases classified elsewhere, unspecified severity, with mood disturbance: Secondary | ICD-10-CM | POA: Diagnosis not present

## 2024-07-21 DIAGNOSIS — E46 Unspecified protein-calorie malnutrition: Secondary | ICD-10-CM | POA: Diagnosis not present

## 2024-07-21 DIAGNOSIS — I700 Atherosclerosis of aorta: Secondary | ICD-10-CM | POA: Diagnosis not present

## 2024-07-21 DIAGNOSIS — Z9181 History of falling: Secondary | ICD-10-CM | POA: Diagnosis not present

## 2024-07-21 DIAGNOSIS — K5909 Other constipation: Secondary | ICD-10-CM | POA: Diagnosis not present

## 2024-07-21 DIAGNOSIS — G20A1 Parkinson's disease without dyskinesia, without mention of fluctuations: Secondary | ICD-10-CM | POA: Diagnosis not present

## 2024-07-21 DIAGNOSIS — I10 Essential (primary) hypertension: Secondary | ICD-10-CM | POA: Diagnosis not present

## 2024-07-21 DIAGNOSIS — R911 Solitary pulmonary nodule: Secondary | ICD-10-CM | POA: Diagnosis not present

## 2024-07-23 DIAGNOSIS — R911 Solitary pulmonary nodule: Secondary | ICD-10-CM | POA: Diagnosis not present

## 2024-07-23 DIAGNOSIS — F0283 Dementia in other diseases classified elsewhere, unspecified severity, with mood disturbance: Secondary | ICD-10-CM | POA: Diagnosis not present

## 2024-07-23 DIAGNOSIS — I700 Atherosclerosis of aorta: Secondary | ICD-10-CM | POA: Diagnosis not present

## 2024-07-23 DIAGNOSIS — E46 Unspecified protein-calorie malnutrition: Secondary | ICD-10-CM | POA: Diagnosis not present

## 2024-07-23 DIAGNOSIS — F39 Unspecified mood [affective] disorder: Secondary | ICD-10-CM | POA: Diagnosis not present

## 2024-07-23 DIAGNOSIS — Z9181 History of falling: Secondary | ICD-10-CM | POA: Diagnosis not present

## 2024-07-23 DIAGNOSIS — I10 Essential (primary) hypertension: Secondary | ICD-10-CM | POA: Diagnosis not present

## 2024-07-23 DIAGNOSIS — G20A1 Parkinson's disease without dyskinesia, without mention of fluctuations: Secondary | ICD-10-CM | POA: Diagnosis not present

## 2024-07-26 ENCOUNTER — Emergency Department (HOSPITAL_COMMUNITY)
Admission: EM | Admit: 2024-07-26 | Discharge: 2024-07-27 | Disposition: A | Discharge status: Skilled Nursing Facility | Source: Skilled Nursing Facility | Attending: Emergency Medicine | Admitting: Emergency Medicine

## 2024-07-26 ENCOUNTER — Emergency Department (HOSPITAL_COMMUNITY)

## 2024-07-26 DIAGNOSIS — R42 Dizziness and giddiness: Secondary | ICD-10-CM | POA: Diagnosis not present

## 2024-07-26 DIAGNOSIS — R410 Disorientation, unspecified: Secondary | ICD-10-CM | POA: Diagnosis not present

## 2024-07-26 DIAGNOSIS — Z7982 Long term (current) use of aspirin: Secondary | ICD-10-CM | POA: Diagnosis not present

## 2024-07-26 DIAGNOSIS — Z79899 Other long term (current) drug therapy: Secondary | ICD-10-CM | POA: Insufficient documentation

## 2024-07-26 DIAGNOSIS — M47812 Spondylosis without myelopathy or radiculopathy, cervical region: Secondary | ICD-10-CM | POA: Diagnosis not present

## 2024-07-26 DIAGNOSIS — R519 Headache, unspecified: Secondary | ICD-10-CM | POA: Diagnosis present

## 2024-07-26 DIAGNOSIS — I10 Essential (primary) hypertension: Secondary | ICD-10-CM | POA: Diagnosis not present

## 2024-07-26 DIAGNOSIS — M25561 Pain in right knee: Secondary | ICD-10-CM | POA: Diagnosis not present

## 2024-07-26 DIAGNOSIS — S8001XA Contusion of right knee, initial encounter: Secondary | ICD-10-CM | POA: Insufficient documentation

## 2024-07-26 DIAGNOSIS — S8002XA Contusion of left knee, initial encounter: Secondary | ICD-10-CM | POA: Diagnosis not present

## 2024-07-26 DIAGNOSIS — W19XXXA Unspecified fall, initial encounter: Secondary | ICD-10-CM

## 2024-07-26 DIAGNOSIS — S199XXA Unspecified injury of neck, initial encounter: Secondary | ICD-10-CM | POA: Diagnosis not present

## 2024-07-26 DIAGNOSIS — G20A1 Parkinson's disease without dyskinesia, without mention of fluctuations: Secondary | ICD-10-CM | POA: Diagnosis not present

## 2024-07-26 DIAGNOSIS — W1809XA Striking against other object with subsequent fall, initial encounter: Secondary | ICD-10-CM | POA: Diagnosis not present

## 2024-07-26 DIAGNOSIS — S0990XA Unspecified injury of head, initial encounter: Secondary | ICD-10-CM | POA: Diagnosis not present

## 2024-07-26 DIAGNOSIS — K5900 Constipation, unspecified: Secondary | ICD-10-CM | POA: Diagnosis not present

## 2024-07-26 DIAGNOSIS — M25562 Pain in left knee: Secondary | ICD-10-CM | POA: Diagnosis not present

## 2024-07-26 NOTE — ED Notes (Signed)
 [facility] called unable to give pick up time

## 2024-07-26 NOTE — ED Triage Notes (Signed)
 BIB [facility] from [place] on [address]. Hit back oon the dresser. Red mark on posterior neck. No complaints of pain. A&O 2  at baseline. Also concerns of urinary incontinence and constipation 150 systolic. 143 CB 30 RR 98.5

## 2024-07-26 NOTE — Discharge Instructions (Signed)
 CT head CT cervical spine without any acute injuries.  X-rays of both knees without any bony injuries.  Patient cleared from the fall.  Patient stable for return back to facility.

## 2024-07-26 NOTE — ED Provider Notes (Signed)
 [place] EMERGENCY DEPARTMENT AT [facility] Provider Note   CSN: [id number] Arrival date & time: 07/26/24  8196     Patient presents with: Fall (/), Constipation, and Urinary Frequency   Hannah Fisher is a 73 y.o. female.   Patient brought in by EMS from [place].  Patient had a fall hit the back of a dresser.  Red mark on posterior neck and the back of her head.  Patient with complaint of headache.  Apparently patient did raise concern to EMS about urinary incontinence and constipation she does have a history of chronic constipation.  Patient's temp here was 98.5 pulse 94 respirations 18 blood pressure 151/100.  Oxygen saturation is 100% on room air.  Patient also does have a complaint of knee pain and Bilateral bruising to both knees.  Moving hips fine.  Past medical history is significant for hypertension and chronic constipation is admitted colitis in the past and Parkinson's disease.  Patient will answer questions.  And will follow commands fine.       Prior to Admission medications   Medication Sig Start Date End Date Taking? Authorizing Provider  acetaminophen  (TYLENOL ) 325 MG tablet Take 2 tablets (650 mg total) by mouth every 6 (six) hours as needed for mild pain (or Fever >/= 101). Patient taking differently: Take 325 mg by mouth in the morning and at bedtime. 05/31/23   Danton Reyes DASEN, MD  aspirin  81 MG EC tablet Take 81 mg by mouth daily.    [provider]  calcium  carbonate (CALCIUM  600) 600 MG TABS tablet Take 600 mg by mouth daily.    [provider]  carbidopa -levodopa  (SINEMET  IR) 25-100 MG tablet Take 1.5-2 tablets by mouth See admin instructions. Take 2 tablets by mouth at 8 AM & 12 NOON, then 1.5 tablets at 8 PM 09/18/22   [provider]  cholecalciferol (VITAMIN D3) 25 MCG (1000 UNIT) tablet Take 1,000 Units by mouth daily.    [provider]  docusate sodium  (COLACE) 100 MG capsule Take 100 mg by mouth 2  (two) times daily.    [provider]  folic acid  (FOLVITE ) 800 MCG tablet Take 800 mcg by mouth daily.    [provider]  linaclotide  (LINZESS ) 145 MCG CAPS capsule Take 1 capsule (145 mcg total) by mouth daily before breakfast. 04/18/24 05/18/24  Laurence Locus, DO  lisinopril  (ZESTRIL ) 2.5 MG tablet Take 2.5 mg by mouth daily.    [provider]  Magnesium  Glycinate 100 MG CAPS Take 200 mg by mouth daily.    [provider]  mirtazapine  (REMERON ) 7.5 MG tablet Take 7.5 mg by mouth at bedtime.    [provider]  polyethylene glycol (MIRALAX  / GLYCOLAX ) 17 g packet Take 17 g by mouth 2 (two) times daily. 04/17/24   Laurence Locus, DO  senna (SENOKOT) 8.6 MG TABS tablet Take 2 tablets (17.2 mg total) by mouth at bedtime. 04/17/24   Laurence Locus, DO  thiamine  (VITAMIN B1) 100 MG tablet Take 200 mg by mouth daily.    [provider]    Allergies: Prednisone, Penicillins, Beta adrenergic blockers, and Sulfa antibiotics    Review of Systems  Constitutional:  Negative for chills and fever.  HENT:  Negative for ear pain and sore throat.   Eyes:  Negative for pain and visual disturbance.  Respiratory:  Negative for cough and shortness of breath.   Cardiovascular:  Negative for chest pain and palpitations.  Gastrointestinal:  Negative for  abdominal pain and vomiting.  Genitourinary:  Negative for dysuria and hematuria.  Musculoskeletal:  Positive for joint swelling. Negative for arthralgias and back pain.  Skin:  Negative for color change and rash.  Neurological:  Positive for headaches. Negative for seizures and syncope.  All other systems reviewed and are negative.   Updated Vital Signs BP (!) 151/100   Pulse 94   Temp 98.5 F (36.9 C) (Oral)   Resp 18   LMP 10/02/1990   SpO2 100%   Physical Exam Vitals and nursing note reviewed.  Constitutional:      General: She is not in acute distress.    Appearance: Normal appearance. She is  well-developed.  HENT:     Head: Normocephalic and atraumatic.  Eyes:     Extraocular Movements: Extraocular movements intact.     Conjunctiva/sclera: Conjunctivae normal.     Pupils: Pupils are equal, round, and reactive to light.  Neck:     Comments: Questionable tenderness to the posterior cervical spine. Cardiovascular:     Rate and Rhythm: Normal rate and regular rhythm.     Heart sounds: No murmur heard. Pulmonary:     Effort: Pulmonary effort is normal. No respiratory distress.     Breath sounds: Normal breath sounds.  Abdominal:     General: There is no distension.     Palpations: Abdomen is soft.     Tenderness: There is no abdominal tenderness. There is no guarding.  Musculoskeletal:        General: Swelling and signs of injury present.     Cervical back: Tenderness present.     Right lower leg: No edema.     Left lower leg: No edema.     Comments: Bruising to both knees.  No patellar dislocation no significant swelling or effusion.  No leg edema.  Neurovascularly intact distally.  No evidence of any injury to upper extremities.  No tenderness to the thoracic or lumbar spine.  Skin:    General: Skin is warm and dry.     Capillary Refill: Capillary refill takes less than 2 seconds.  Neurological:     General: No focal deficit present.     Mental Status: She is alert. Mental status is at baseline.  Psychiatric:        Mood and Affect: Mood normal.     (all labs ordered are listed, but only abnormal results are displayed) Labs Reviewed - No data to display   EKG: EKG Interpretation Date/Time:  Saturday July 26 2024 18:15:41 EDT Ventricular Rate:  94 PR Interval:  117 QRS Duration:  87 QT Interval:  360 QTC Calculation: 451 R Axis:   69  Text Interpretation: Sinus rhythm Borderline short PR interval No significant change since last tracing Confirmed by Franklin Baumbach [phone]) on 07/26/2024 7:29:10 PM  Radiology: [facility] Knee Complete 4 Views Left Result  Date: 07/26/2024 CLINICAL DATA:  Bilateral knee pain after fall. EXAM: LEFT KNEE - COMPLETE 4+ VIEW COMPARISON:  None Available. FINDINGS: The bones are subjectively under mineralized. No evidence of fracture, dislocation, or joint effusion. No evidence of arthropathy or other focal bone abnormality. Soft tissues are unremarkable. IMPRESSION: No fracture or dislocation of the left knee. Electronically Signed   By: Andrea Gasman M.D.   On: 07/26/2024 20:44   DG Knee Complete 4 Views Right Result Date: 07/26/2024 CLINICAL DATA:  Bilateral knee pain after fall. EXAM: RIGHT KNEE - COMPLETE 4+ VIEW COMPARISON:  None Available. FINDINGS: The bones are subjectively under  mineralized. No evidence of fracture, dislocation, or joint effusion. No evidence of arthropathy or other focal bone abnormality. Soft tissues are unremarkable. IMPRESSION: No fracture or dislocation of the right knee. Electronically Signed   By: Andrea Gasman M.D.   On: 07/26/2024 20:43   CT Head Wo Contrast Result Date: 07/26/2024 EXAM: CT HEAD WITHOUT CONTRAST 07/26/2024 07:49:38 PM TECHNIQUE: CT of the head was performed without the administration of intravenous contrast. Automated exposure control, iterative reconstruction, and/or weight based adjustment of the mA/kV was utilized to reduce the radiation dose to as low as reasonably achievable. COMPARISON: 08/24/2023 CLINICAL HISTORY: Head trauma, minor (Age >= 65y). fall. FINDINGS: BRAIN AND VENTRICLES: No acute hemorrhage. No evidence of acute infarct. Periventricular and subcortical white matter hypoattenuation, likely chronic microvascular ischemic changes. Remote right thalamic lacunar infarct. Calcified atherosclerotic plaque within cavernous/supraclinoid ICA and intradural vertebral arteries. No hydrocephalus. No extra-axial collection. No mass effect or midline shift. ORBITS: No acute abnormality. SINUSES: No acute abnormality. SOFT TISSUES AND SKULL: No acute soft tissue  abnormality. No skull fracture. IMPRESSION: 1. No acute intracranial abnormality. Electronically signed by: Pinkie Pebbles MD 07/26/2024 08:04 PM EDT RP Workstation: [id number]   CT Cervical Spine Wo Contrast Result Date: 07/26/2024 EXAM: CT CERVICAL SPINE WITHOUT CONTRAST 07/26/2024 07:49:38 PM TECHNIQUE: CT of the cervical spine was performed without the administration of intravenous contrast. Multiplanar reformatted images are provided for review. Automated exposure control, iterative reconstruction, and/or weight based adjustment of the mA/kV was utilized to reduce the radiation dose to as low as reasonably achievable. COMPARISON: None available. CLINICAL HISTORY: Polytrauma, blunt. fall FINDINGS: CERVICAL SPINE: BONES AND ALIGNMENT: No acute fracture or traumatic malalignment. DEGENERATIVE CHANGES: Mild degenerative changes of the mid cervical spine. SOFT TISSUES: No prevertebral soft tissue swelling. IMPRESSION: 1. No acute abnormality of the cervical spine. Electronically signed by: Pinkie Pebbles MD 07/26/2024 08:01 PM EDT RP Workstation: [id number]     Procedures   Medications Ordered in the ED - No data to display                                  Medical Decision Making Amount and/or Complexity of Data Reviewed Labs: ordered. Radiology: ordered.   CT cervical spine no acute abnormality.  CT head no acute intercranial abnormality.  X-rays of both knees pending. X-ray of right knee without any acute fractures or injuries x-ray of left knee without any acute fractures or injury.  Final diagnoses:  Fall, initial encounter  Minor head injury, initial encounter  Acute pain of both knees    ED Discharge Orders     None          Geraldene Hamilton, MD 07/26/24 2100

## 2024-07-28 DIAGNOSIS — I10 Essential (primary) hypertension: Secondary | ICD-10-CM | POA: Diagnosis not present

## 2024-07-28 DIAGNOSIS — I700 Atherosclerosis of aorta: Secondary | ICD-10-CM | POA: Diagnosis not present

## 2024-07-28 DIAGNOSIS — Z9181 History of falling: Secondary | ICD-10-CM | POA: Diagnosis not present

## 2024-07-28 DIAGNOSIS — K5909 Other constipation: Secondary | ICD-10-CM | POA: Diagnosis not present

## 2024-07-28 DIAGNOSIS — R911 Solitary pulmonary nodule: Secondary | ICD-10-CM | POA: Diagnosis not present

## 2024-07-28 DIAGNOSIS — G20A1 Parkinson's disease without dyskinesia, without mention of fluctuations: Secondary | ICD-10-CM | POA: Diagnosis not present

## 2024-07-28 DIAGNOSIS — F0283 Dementia in other diseases classified elsewhere, unspecified severity, with mood disturbance: Secondary | ICD-10-CM | POA: Diagnosis not present

## 2024-07-28 DIAGNOSIS — F39 Unspecified mood [affective] disorder: Secondary | ICD-10-CM | POA: Diagnosis not present

## 2024-07-28 DIAGNOSIS — E46 Unspecified protein-calorie malnutrition: Secondary | ICD-10-CM | POA: Diagnosis not present

## 2024-07-29 DIAGNOSIS — F39 Unspecified mood [affective] disorder: Secondary | ICD-10-CM | POA: Diagnosis not present

## 2024-07-29 DIAGNOSIS — R911 Solitary pulmonary nodule: Secondary | ICD-10-CM | POA: Diagnosis not present

## 2024-07-29 DIAGNOSIS — K5909 Other constipation: Secondary | ICD-10-CM | POA: Diagnosis not present

## 2024-07-29 DIAGNOSIS — G20A1 Parkinson's disease without dyskinesia, without mention of fluctuations: Secondary | ICD-10-CM | POA: Diagnosis not present

## 2024-07-29 DIAGNOSIS — E46 Unspecified protein-calorie malnutrition: Secondary | ICD-10-CM | POA: Diagnosis not present

## 2024-07-29 DIAGNOSIS — F0283 Dementia in other diseases classified elsewhere, unspecified severity, with mood disturbance: Secondary | ICD-10-CM | POA: Diagnosis not present

## 2024-07-29 DIAGNOSIS — I10 Essential (primary) hypertension: Secondary | ICD-10-CM | POA: Diagnosis not present

## 2024-07-29 DIAGNOSIS — I700 Atherosclerosis of aorta: Secondary | ICD-10-CM | POA: Diagnosis not present

## 2024-07-29 DIAGNOSIS — Z9181 History of falling: Secondary | ICD-10-CM | POA: Diagnosis not present
